# Patient Record
Sex: Female | Born: 1960
Health system: Southern US, Community
[De-identification: ages and names within clinical notes are randomized; demographics above are authoritative.]

## PROBLEM LIST (undated history)

## (undated) DIAGNOSIS — F32A Depression, unspecified: Secondary | ICD-10-CM

## (undated) DIAGNOSIS — T7840XA Allergy, unspecified, initial encounter: Secondary | ICD-10-CM

## (undated) DIAGNOSIS — F419 Anxiety disorder, unspecified: Secondary | ICD-10-CM

## (undated) DIAGNOSIS — I10 Essential (primary) hypertension: Secondary | ICD-10-CM

## (undated) DIAGNOSIS — F329 Major depressive disorder, single episode, unspecified: Secondary | ICD-10-CM

## (undated) DIAGNOSIS — E785 Hyperlipidemia, unspecified: Secondary | ICD-10-CM

## (undated) HISTORY — DX: Major depressive disorder, single episode, unspecified: F32.9

## (undated) HISTORY — DX: Depression, unspecified: F32.A

## (undated) HISTORY — DX: Hyperlipidemia, unspecified: E78.5

## (undated) HISTORY — DX: Anxiety disorder, unspecified: F41.9

## (undated) HISTORY — DX: Essential (primary) hypertension: I10

## (undated) HISTORY — DX: Allergy, unspecified, initial encounter: T78.40XA

## (undated) HISTORY — PX: ABDOMINAL HYSTERECTOMY: SHX81

---

## 2004-09-23 ENCOUNTER — Ambulatory Visit: Payer: Self-pay | Admitting: Obstetrics and Gynecology

## 2006-02-02 ENCOUNTER — Ambulatory Visit: Payer: Self-pay | Admitting: Obstetrics and Gynecology

## 2006-08-11 DIAGNOSIS — F32A Depression, unspecified: Secondary | ICD-10-CM | POA: Insufficient documentation

## 2006-08-11 DIAGNOSIS — I10 Essential (primary) hypertension: Secondary | ICD-10-CM | POA: Insufficient documentation

## 2006-08-11 DIAGNOSIS — M129 Arthropathy, unspecified: Secondary | ICD-10-CM | POA: Insufficient documentation

## 2006-09-16 DIAGNOSIS — E7800 Pure hypercholesterolemia, unspecified: Secondary | ICD-10-CM | POA: Insufficient documentation

## 2007-04-29 DIAGNOSIS — L259 Unspecified contact dermatitis, unspecified cause: Secondary | ICD-10-CM | POA: Insufficient documentation

## 2008-03-13 DIAGNOSIS — L0292 Furuncle, unspecified: Secondary | ICD-10-CM | POA: Insufficient documentation

## 2008-03-13 DIAGNOSIS — Q6650 Congenital pes planus, unspecified foot: Secondary | ICD-10-CM | POA: Insufficient documentation

## 2008-11-08 ENCOUNTER — Ambulatory Visit: Payer: Self-pay | Admitting: Obstetrics and Gynecology

## 2009-02-22 ENCOUNTER — Ambulatory Visit: Payer: Self-pay

## 2009-03-15 ENCOUNTER — Ambulatory Visit: Payer: Self-pay

## 2009-11-13 DIAGNOSIS — G4700 Insomnia, unspecified: Secondary | ICD-10-CM | POA: Insufficient documentation

## 2010-01-31 ENCOUNTER — Ambulatory Visit: Payer: Self-pay

## 2010-01-31 LAB — HM MAMMOGRAPHY

## 2013-11-02 LAB — LIPID PANEL
Cholesterol: 225 mg/dL — AB (ref 0–200)
HDL: 80 mg/dL — AB (ref 35–70)
LDL Cholesterol: 129 mg/dL
Triglycerides: 81 mg/dL (ref 40–160)

## 2013-11-02 LAB — BASIC METABOLIC PANEL
BUN: 20 mg/dL (ref 4–21)
CREATININE: 1.1 mg/dL (ref 0.5–1.1)
GLUCOSE: 104 mg/dL
Potassium: 4.8 mmol/L (ref 3.4–5.3)
Sodium: 138 mmol/L (ref 137–147)

## 2013-11-02 LAB — HEPATIC FUNCTION PANEL
ALT: 15 U/L (ref 7–35)
AST: 14 U/L (ref 13–35)

## 2013-11-02 LAB — TSH: TSH: 2.77 u[IU]/mL (ref 0.41–5.90)

## 2013-11-02 LAB — CBC AND DIFFERENTIAL
HEMATOCRIT: 40 % (ref 36–46)
HEMOGLOBIN: 13.2 g/dL (ref 12.0–16.0)
PLATELETS: 279 10*3/uL (ref 150–399)
WBC: 6.7 10*3/mL

## 2014-02-07 LAB — HEMOGLOBIN A1C: Hgb A1c MFr Bld: 6.1 % — AB (ref 4.0–6.0)

## 2015-03-15 ENCOUNTER — Other Ambulatory Visit: Payer: Self-pay | Admitting: Family Medicine

## 2015-03-15 DIAGNOSIS — I10 Essential (primary) hypertension: Secondary | ICD-10-CM | POA: Insufficient documentation

## 2015-03-15 DIAGNOSIS — F329 Major depressive disorder, single episode, unspecified: Secondary | ICD-10-CM

## 2015-03-15 DIAGNOSIS — F32A Depression, unspecified: Secondary | ICD-10-CM | POA: Insufficient documentation

## 2015-03-15 MED ORDER — LOSARTAN POTASSIUM 25 MG PO TABS: 25.0000 mg | ORAL_TABLET | Freq: Every day | ORAL | Status: DC

## 2015-03-15 MED ORDER — DILTIAZEM HCL ER COATED BEADS 240 MG PO CP24: 240.0000 mg | ORAL_CAPSULE | Freq: Every day | ORAL | Status: DC

## 2015-03-15 MED ORDER — BUPROPION HCL ER (XL) 150 MG PO TB24: 150.0000 mg | ORAL_TABLET | Freq: Every day | ORAL | Status: DC

## 2015-03-15 NOTE — Telephone Encounter (Signed)
Pt is taking Diltiazem 240mg  in [employer].   Thanks,   -Mickel Baas

## 2015-03-15 NOTE — Telephone Encounter (Signed)
Pt called wanting refills on RX .     losartan 25mg , bupropion xl tab 150 mg 1 tab daily, diltiazem cd 140mg    She usually uses [facility] but wants 30 days called in to [place].  Her call back. Is [phone].  Thanks Con Memos

## 2015-05-29 ENCOUNTER — Other Ambulatory Visit: Payer: Self-pay | Admitting: Family Medicine

## 2015-05-29 DIAGNOSIS — R5383 Other fatigue: Secondary | ICD-10-CM | POA: Insufficient documentation

## 2015-05-29 DIAGNOSIS — R197 Diarrhea, unspecified: Secondary | ICD-10-CM | POA: Insufficient documentation

## 2015-05-29 DIAGNOSIS — J309 Allergic rhinitis, unspecified: Secondary | ICD-10-CM | POA: Insufficient documentation

## 2015-05-29 DIAGNOSIS — I10 Essential (primary) hypertension: Secondary | ICD-10-CM

## 2015-05-29 DIAGNOSIS — E559 Vitamin D deficiency, unspecified: Secondary | ICD-10-CM | POA: Insufficient documentation

## 2015-05-29 DIAGNOSIS — R7303 Prediabetes: Secondary | ICD-10-CM | POA: Insufficient documentation

## 2015-05-29 DIAGNOSIS — F411 Generalized anxiety disorder: Secondary | ICD-10-CM | POA: Insufficient documentation

## 2015-07-12 ENCOUNTER — Ambulatory Visit: Payer: Self-pay | Admitting: Family Medicine

## 2015-09-18 ENCOUNTER — Other Ambulatory Visit: Payer: Self-pay

## 2015-09-18 DIAGNOSIS — F329 Major depressive disorder, single episode, unspecified: Secondary | ICD-10-CM

## 2015-09-18 DIAGNOSIS — F32A Depression, unspecified: Secondary | ICD-10-CM

## 2015-09-18 MED ORDER — BUPROPION HCL ER (XL) 150 MG PO TB24: 150.0000 mg | ORAL_TABLET | Freq: Every day | ORAL | Status: DC

## 2015-09-18 MED ORDER — FLUOXETINE HCL 20 MG PO TABS: 60.0000 mg | ORAL_TABLET | Freq: Every day | ORAL | Status: DC

## 2015-09-18 NOTE — Telephone Encounter (Signed)
Last OV 12/2014  Thanks,   -Mickel Baas

## 2015-10-15 ENCOUNTER — Other Ambulatory Visit: Payer: Self-pay | Admitting: Family Medicine

## 2015-10-15 DIAGNOSIS — J309 Allergic rhinitis, unspecified: Secondary | ICD-10-CM

## 2015-10-15 NOTE — Telephone Encounter (Signed)
Last OV 12/2014  Thanks,   -Mickel Baas

## 2015-12-24 ENCOUNTER — Ambulatory Visit (INDEPENDENT_AMBULATORY_CARE_PROVIDER_SITE_OTHER): Payer: 59 | Admitting: Physician Assistant

## 2015-12-24 ENCOUNTER — Encounter: Payer: Self-pay | Admitting: Physician Assistant

## 2015-12-24 VITALS — BP 138/98 | HR 62 | Temp 98.6°F | Resp 16 | Ht 62.0 in | Wt 313.2 lb

## 2015-12-24 DIAGNOSIS — G4700 Insomnia, unspecified: Secondary | ICD-10-CM | POA: Diagnosis not present

## 2015-12-24 DIAGNOSIS — Z0000 Encounter for general adult medical examination without abnormal findings: Secondary | ICD-10-CM | POA: Diagnosis not present

## 2015-12-24 DIAGNOSIS — Z1239 Encounter for other screening for malignant neoplasm of breast: Secondary | ICD-10-CM | POA: Diagnosis not present

## 2015-12-24 DIAGNOSIS — E559 Vitamin D deficiency, unspecified: Secondary | ICD-10-CM | POA: Diagnosis not present

## 2015-12-24 DIAGNOSIS — F329 Major depressive disorder, single episode, unspecified: Secondary | ICD-10-CM

## 2015-12-24 DIAGNOSIS — Z1211 Encounter for screening for malignant neoplasm of colon: Secondary | ICD-10-CM | POA: Diagnosis not present

## 2015-12-24 DIAGNOSIS — F32A Depression, unspecified: Secondary | ICD-10-CM

## 2015-12-24 DIAGNOSIS — R7303 Prediabetes: Secondary | ICD-10-CM | POA: Diagnosis not present

## 2015-12-24 DIAGNOSIS — E7800 Pure hypercholesterolemia, unspecified: Secondary | ICD-10-CM

## 2015-12-24 DIAGNOSIS — I10 Essential (primary) hypertension: Secondary | ICD-10-CM | POA: Diagnosis not present

## 2015-12-24 NOTE — Progress Notes (Signed)
Patient: Ashley Mccullough, Female    DOB: [birth year], 55 y.o.   MRN: [id number] Visit Date: 12/24/2015  Today's Provider: Mar Daring, PA-C   Chief Complaint  Patient presents with  . Annual Exam   Subjective:    Annual physical exam Ashley Mccullough is a 55 y.o. female who presents today for health maintenance and complete physical. She feels fairly well, she has frequent urination only.She reports going to the bathroom every 15 to 20 minutes sometimes specially when she is stress. During the night she wakes up at least 3 times to go to the bathroom if she has not taken the Trazadone. If she takes the Trazodone she wakes up 1 time to go. She reports that when she takes Trazadone she sleeps well. She usually takes it twice on the weekdays and mostly on the weekends because that way she is not afraid to oversleep. She reports exercising- 3 x a week she does the slimfit board with eight pound weights on her hands.   Depression: Patient needs refill on her Prozac. She complains of difficulty concentrating sometimes. She denies current suicidal and homicidal plan or intent.She complains of the following side effects from the treatment: none.She takes Prozac, Trazodone and Bupropion. She needs a refill on her prozac.  Urine frequency is not a new problem. This has been something she has had for a long time. She states she even had it before her hysterectomy, which she had in 2005.   Pap-UNK- Hysterectomy in 2005 due to fibroids. Ovaries remain.  Mammogram: UNK- has had one mammogram. Does not perform self breast exams. Positive family history for breast cancer in mother, maternal aunt, and 1st cousin (daughter of maternal aunt).  Colonoscopy: Refused TDAP: 12/26/2009   Review of Systems  Constitutional: Negative.   HENT: Negative.   Eyes: Negative.   Respiratory: Negative.   Cardiovascular: Positive for leg swelling (only after sitting all day). Negative for chest pain and  palpitations.  Gastrointestinal: Negative.   Endocrine: Positive for polyuria.  Genitourinary: Negative.   Musculoskeletal: Negative.   Skin: Negative.   Allergic/Immunologic: Positive for environmental allergies and food allergies.  Neurological: Negative.   Hematological: Negative.   Psychiatric/Behavioral: Negative.     Social History      She  reports that she has never smoked. She has never used smokeless tobacco. She reports that she drinks alcohol. She reports that she does not use illicit drugs.       Social History   Social History  . Marital Status: Divorced    Spouse Name: N/A  . Number of Children: N/A  . Years of Education: N/A   Social History Main Topics  . Smoking status: Never Smoker   . Smokeless tobacco: Never Used  . Alcohol Use: Yes  . Drug Use: No  . Sexual Activity: Not Asked   Other Topics Concern  . None   Social History Narrative    Past Medical History  Diagnosis Date  . Depression   . Allergy   . Anxiety   . Hypertension   . Hyperlipidemia      Patient Active Problem List   Diagnosis Date Noted  . Allergic rhinitis 05/29/2015  . Anxiety, generalized 05/29/2015  . D (diarrhea) 05/29/2015  . Avitaminosis D 05/29/2015  . Borderline diabetes 05/29/2015  . Fatigue 05/29/2015  . Depression 03/15/2015  . Hypertension 03/15/2015  . Cannot sleep 11/13/2009  . Carbuncle and furuncle 03/13/2008  . Congenital  pes planus 03/13/2008  . CD (contact dermatitis) 04/29/2007  . Hypercholesterolemia without hypertriglyceridemia 09/16/2006  . Arthropathia 08/11/2006  . Clinical depression 08/11/2006  . Essential (primary) hypertension 08/11/2006    Past Surgical History  Procedure Laterality Date  . Abdominal hysterectomy      still has ovaries    Family History        Family Status  Relation Status Death Age  . Mother Alive     pre diabetic  . Father Deceased 57    CAD  . Sister Alive   . Brother Alive   . Brother Alive          Her family history includes Alcohol abuse in her father; Cancer in her mother; Depression in her father; Diabetes in her brother and brother; Heart disease in her father; Hypertension in her sister.    Allergies  Allergen Reactions  . Simvastatin     Memory changes  . Sulfa Antibiotics     Previous Medications   BUPROPION (WELLBUTRIN XL) 150 MG 24 HR TABLET    Take 1 tablet (150 mg total) by mouth daily.   CHOLECALCIFEROL (VITAMIN D3) 5000 UNITS TABS    Take 5,000 tablets by mouth daily.   DILTIAZEM (CARDIZEM CD) 240 MG 24 HR CAPSULE    Take 1 capsule by mouth  daily   FEXOFENADINE (ALLEGRA) 180 MG TABLET    TAKE 1 TABLET BY MOUTH DAILY   FLUOXETINE (PROZAC) 20 MG TABLET    Take 3 tablets (60 mg total) by mouth daily.   LOSARTAN (COZAAR) 25 MG TABLET    Take 1 tablet by mouth  every day   MONTELUKAST (SINGULAIR) 10 MG TABLET    Take 1 tablet by mouth  daily   TRAZODONE (DESYREL) 100 MG TABLET    Take by mouth. 1 1/2 tablet at bedtime    Patient Care Team: Mar Daring, PA-C as PCP - General (Family Medicine)     Objective:   Vitals: BP 138/98 mmHg  Pulse 62  Temp(Src) 98.6 F (37 C) (Oral)  Resp 16  Ht 5\' 2"  (1.575 m)  Wt 313 lb 3.2 oz (142.067 kg)  BMI 57.27 kg/m2   Physical Exam  Constitutional: She is oriented to person, place, and time. She appears well-developed and well-nourished. No distress.  HENT:  Head: Normocephalic and atraumatic.  Right Ear: External ear normal.  Left Ear: External ear normal.  Nose: Nose normal.  Mouth/Throat: Oropharynx is clear and moist. No oropharyngeal exudate.  Eyes: Conjunctivae and EOM are normal. Pupils are equal, round, and reactive to light. Right eye exhibits no discharge. Left eye exhibits no discharge. No scleral icterus.  Neck: Normal range of motion. Neck supple. No JVD present. No tracheal deviation present. No thyromegaly present.  Cardiovascular: Normal rate, regular rhythm, normal heart sounds and intact distal  pulses.  Exam reveals no gallop and no friction rub.   No murmur heard. Pulmonary/Chest: Effort normal and breath sounds normal. No respiratory distress. She has no decreased breath sounds. She has no wheezes. She has no rales. She exhibits no tenderness. Right breast exhibits inverted nipple. Right breast exhibits no mass, no nipple discharge, no skin change and no tenderness. Left breast exhibits inverted nipple. Left breast exhibits no mass, no nipple discharge, no skin change and no tenderness. Breasts are symmetrical.  Abdominal: Soft. Bowel sounds are normal. She exhibits no distension and no mass. There is no tenderness. There is no rebound and no guarding.  Genitourinary:  Deferred because patient "was not prepared" today. Advised will do next year.  Musculoskeletal: Normal range of motion. She exhibits edema (trace). She exhibits no tenderness.  Lymphadenopathy:    She has no cervical adenopathy.  Neurological: She is alert and oriented to person, place, and time.  Skin: Skin is warm and dry. No rash noted. She is not diaphoretic.  Psychiatric: She has a normal mood and affect. Her behavior is normal. Judgment and thought content normal.  Vitals reviewed.    Depression Screen No flowsheet data found.    Assessment & Plan:     Routine Health Maintenance and Physical Exam  1. Annual physical exam Physical exam today was normal with exception of morbid obesity. She is exercising and trying to lose weight. Will check labs as below. F/u pending lab results. If labs are stable and WNL will not need repeated for one year at next annual physical exam. She is to call in the meantime if she develops any acute issue, question or concern. - CBC with Differential/Platelet - Comprehensive metabolic panel - TSH  2. Essential hypertension Stable. Will check labs as below and f/u pending results. Continue cardizem and cozaar as directed. - CBC with Differential/Platelet - Comprehensive  metabolic panel  3. Borderline diabetes Will check labs as below and f/u pending results. - Hemoglobin A1c  4. Depression Stable on wellbutrin and prozac. Prozac refilled.   5. Cannot sleep Stable with trazodone. Continue current medical treatment plan.  6. Hypercholesterolemia without hypertriglyceridemia Diet controlled. Will check labs as below and f/u pending results. - Lipid panel  7. Breast cancer screening Breast exam normal today. Mammogram ordered as below. Information for [facility] was given to patient so she may call and schedule this at her convenience. - MM DIGITAL SCREENING BILATERAL; Future  8. Avitaminosis D Will check Vit D level as below. Currently on 5000 IU daily.  - Vitamin D (25 hydroxy)  9. Colon cancer screening Patient refused colonoscopy. She was given information on cologuard so she may call to see if her insurance covers it. She will call if it is wanted.   Exercise Activities and Dietary recommendations Goals    None      Immunization History  Administered Date(s) Administered  . Tdap 12/26/2009    Health Maintenance  Topic Date Due  . Hepatitis C Screening  11/21/1960  . HIV Screening  04/20/1976  . PAP SMEAR  04/20/1982  . COLONOSCOPY  04/21/2011  . MAMMOGRAM  02/01/2012  . INFLUENZA VACCINE  04/15/2016  . TETANUS/TDAP  12/27/2019      Discussed health benefits of physical activity, and encouraged her to engage in regular exercise appropriate for her age and condition.    --------------------------------------------------------------------

## 2015-12-24 NOTE — Patient Instructions (Signed)

## 2015-12-25 LAB — COMPREHENSIVE METABOLIC PANEL
A/G RATIO: 1.1 — AB (ref 1.2–2.2)
ALBUMIN: 3.9 g/dL (ref 3.5–5.5)
ALT: 15 IU/L (ref 0–32)
AST: 19 IU/L (ref 0–40)
Alkaline Phosphatase: 93 IU/L (ref 39–117)
BUN/Creatinine Ratio: 20 (ref 9–23)
BUN: 19 mg/dL (ref 6–24)
Bilirubin Total: 0.3 mg/dL (ref 0.0–1.2)
CALCIUM: 9.2 mg/dL (ref 8.7–10.2)
CO2: 21 mmol/L (ref 18–29)
CREATININE: 0.95 mg/dL (ref 0.57–1.00)
Chloride: 100 mmol/L (ref 96–106)
GFR, EST AFRICAN AMERICAN: 79 mL/min/{1.73_m2} (ref >59)
GFR, EST NON AFRICAN AMERICAN: 68 mL/min/{1.73_m2} (ref >59)
GLOBULIN, TOTAL: 3.5 g/dL (ref 1.5–4.5)
Glucose: 95 mg/dL (ref 65–99)
POTASSIUM: 4.5 mmol/L (ref 3.5–5.2)
SODIUM: 138 mmol/L (ref 134–144)
TOTAL PROTEIN: 7.4 g/dL (ref 6.0–8.5)

## 2015-12-25 LAB — CBC WITH DIFFERENTIAL/PLATELET
BASOS: 0 %
Basophils Absolute: 0 10*3/uL (ref 0.0–0.2)
EOS (ABSOLUTE): 0.2 10*3/uL (ref 0.0–0.4)
EOS: 3 %
HEMATOCRIT: 42.3 % (ref 34.0–46.6)
HEMOGLOBIN: 13.8 g/dL (ref 11.1–15.9)
IMMATURE GRANS (ABS): 0 10*3/uL (ref 0.0–0.1)
IMMATURE GRANULOCYTES: 0 %
Lymphocytes Absolute: 1.5 10*3/uL (ref 0.7–3.1)
Lymphs: 21 %
MCH: 28.8 pg (ref 26.6–33.0)
MCHC: 32.6 g/dL (ref 31.5–35.7)
MCV: 88 fL (ref 79–97)
MONOCYTES: 5 %
MONOS ABS: 0.4 10*3/uL (ref 0.1–0.9)
NEUTROS PCT: 71 %
Neutrophils Absolute: 5 10*3/uL (ref 1.4–7.0)
Platelets: 287 10*3/uL (ref 150–379)
RBC: 4.8 x10E6/uL (ref 3.77–5.28)
RDW: 14.6 % (ref 12.3–15.4)
WBC: 7.1 10*3/uL (ref 3.4–10.8)

## 2015-12-25 LAB — LIPID PANEL
CHOL/HDL RATIO: 2.7 ratio (ref 0.0–4.4)
Cholesterol, Total: 236 mg/dL — ABNORMAL HIGH (ref 100–199)
HDL: 87 mg/dL (ref >39)
LDL CALC: 136 mg/dL — AB (ref 0–99)
TRIGLYCERIDES: 64 mg/dL (ref 0–149)
VLDL Cholesterol Cal: 13 mg/dL (ref 5–40)

## 2015-12-25 LAB — HEMOGLOBIN A1C
Est. average glucose Bld gHb Est-mCnc: 128 mg/dL
Hgb A1c MFr Bld: 6.1 % — ABNORMAL HIGH (ref 4.8–5.6)

## 2015-12-25 LAB — VITAMIN D 25 HYDROXY (VIT D DEFICIENCY, FRACTURES): Vit D, 25-Hydroxy: 35 ng/mL (ref 30.0–100.0)

## 2015-12-25 LAB — TSH: TSH: 2.18 u[IU]/mL (ref 0.450–4.500)

## 2015-12-26 ENCOUNTER — Telehealth: Payer: Self-pay

## 2015-12-26 NOTE — Telephone Encounter (Signed)
-----   Message from Mar Daring, PA-C sent at 12/25/2015  8:32 AM EDT ----- All labs are within normal limits and stable.  HgBA1c remains 6.1 stable from last year. Fasting sugar has improved from 104 last year to 95 which shows diet changes. Total cholesterol and LDL are both elevated slightly, however, your HDL is also elevated which offers cardioprotection and lowers your cardiovascular risk so no cholesterol lowering medication is required. Vit D level is normal. Continue dieting and exercise. Will recheck labs in one year at next annual physical. Thanks! -JB

## 2015-12-26 NOTE — Telephone Encounter (Signed)
Patient advised as directed below. Patient verbalized understanding and agrees with plan of care.  

## 2015-12-26 NOTE — Telephone Encounter (Signed)
LMTCB  Thanks,  -Trestan Vahle 

## 2015-12-27 ENCOUNTER — Ambulatory Visit: Payer: Self-pay

## 2015-12-28 ENCOUNTER — Telehealth: Payer: Self-pay

## 2015-12-28 DIAGNOSIS — F329 Major depressive disorder, single episode, unspecified: Secondary | ICD-10-CM

## 2015-12-28 DIAGNOSIS — F32A Depression, unspecified: Secondary | ICD-10-CM

## 2015-12-28 NOTE — Telephone Encounter (Signed)
Patient called and states that she needed Prozac switched back to capsules instead of tablets. She states she addressed this on her visit with you on the 10th. Please send for [facility] day supply to [facility], patient is almost out if this could be done today. Please. Advised patient to check with pharmacy later and if there is a problem getting it filled we will call her back. -aa

## 2015-12-31 MED ORDER — FLUOXETINE HCL 20 MG PO CAPS: 60.0000 mg | ORAL_CAPSULE | Freq: Every day | ORAL | Status: DC

## 2015-12-31 NOTE — Telephone Encounter (Signed)
Sent to [facility]

## 2016-01-04 ENCOUNTER — Ambulatory Visit
Admission: RE | Admit: 2016-01-04 | Discharge: 2016-01-04 | Disposition: A | Discharge status: Home / Self Care | Payer: 59 | Source: Ambulatory Visit | Attending: Physician Assistant | Admitting: Physician Assistant

## 2016-01-04 ENCOUNTER — Telehealth: Payer: Self-pay

## 2016-01-04 DIAGNOSIS — Z1239 Encounter for other screening for malignant neoplasm of breast: Secondary | ICD-10-CM

## 2016-01-04 DIAGNOSIS — Z1231 Encounter for screening mammogram for malignant neoplasm of breast: Secondary | ICD-10-CM | POA: Insufficient documentation

## 2016-01-04 NOTE — Telephone Encounter (Signed)
-----   Message from Mar Daring, [place] sent at 01/04/2016 11:16 AM EDT ----- Normal mammogram. Repeat screening in one year.

## 2016-01-04 NOTE — Telephone Encounter (Signed)
Patient advised as directed below.  Thanks,  -Joseline 

## 2016-03-24 ENCOUNTER — Other Ambulatory Visit: Payer: Self-pay | Admitting: Family Medicine

## 2016-03-24 DIAGNOSIS — F411 Generalized anxiety disorder: Secondary | ICD-10-CM

## 2016-03-24 DIAGNOSIS — F329 Major depressive disorder, single episode, unspecified: Secondary | ICD-10-CM

## 2016-03-24 DIAGNOSIS — F32A Depression, unspecified: Secondary | ICD-10-CM

## 2016-03-24 NOTE — Telephone Encounter (Signed)
Had CPE with you on 12/24/2015. Renaldo Fiddler, CMA

## 2016-05-26 ENCOUNTER — Ambulatory Visit (INDEPENDENT_AMBULATORY_CARE_PROVIDER_SITE_OTHER): Payer: 59 | Admitting: Physician Assistant

## 2016-05-26 ENCOUNTER — Encounter: Payer: Self-pay | Admitting: Physician Assistant

## 2016-05-26 VITALS — BP 130/70 | HR 63 | Temp 97.6°F | Resp 16 | Ht 62.0 in | Wt 317.4 lb

## 2016-05-26 DIAGNOSIS — Z6841 Body Mass Index (BMI) 40.0 and over, adult: Secondary | ICD-10-CM

## 2016-05-26 DIAGNOSIS — L02234 Carbuncle of groin: Secondary | ICD-10-CM | POA: Diagnosis not present

## 2016-05-26 DIAGNOSIS — M5432 Sciatica, left side: Secondary | ICD-10-CM | POA: Diagnosis not present

## 2016-05-26 DIAGNOSIS — G4700 Insomnia, unspecified: Secondary | ICD-10-CM | POA: Diagnosis not present

## 2016-05-26 DIAGNOSIS — M5431 Sciatica, right side: Secondary | ICD-10-CM | POA: Diagnosis not present

## 2016-05-26 MED ORDER — CLONAZEPAM 1 MG PO TABS: 1.0000 mg | ORAL_TABLET | Freq: Every day | ORAL | 1 refills | Status: DC

## 2016-05-26 MED ORDER — DOXYCYCLINE HYCLATE 100 MG PO TABS: 100.0000 mg | ORAL_TABLET | Freq: Two times a day (BID) | ORAL | 0 refills | Status: DC

## 2016-05-26 MED ORDER — CYCLOBENZAPRINE HCL 5 MG PO TABS: 5.0000 mg | ORAL_TABLET | Freq: Every day | ORAL | 1 refills | Status: DC

## 2016-05-26 NOTE — Patient Instructions (Signed)
Clonazepam tablets What is this medicine? CLONAZEPAM (kloe NA ze pam) is a benzodiazepine. It is used to treat certain types of seizures. It is also used to treat panic disorder. This medicine may be used for other purposes; ask your health care provider or pharmacist if you have questions. What should I tell my health care provider before I take this medicine? They need to know if you have any of these conditions: -an alcohol or drug abuse problem -bipolar disorder, depression, psychosis or other mental health condition -glaucoma -kidney or liver disease -lung or breathing disease -myasthenia gravis -Parkinson's disease -porphyria -seizures or a history of seizures -suicidal thoughts -an unusual or allergic reaction to clonazepam, other benzodiazepines, foods, dyes, or preservatives -pregnant or trying to get pregnant -breast-feeding How should I use this medicine? Take this medicine by mouth with a glass of water. Follow the directions on the prescription label. If it upsets your stomach, take it with food or milk. Take your medicine at regular intervals. Do not take it more often than directed. Do not stop taking or change the dose except on the advice of your doctor or health care professional. A special MedGuide will be given to you by the pharmacist with each prescription and refill. Be sure to read this information carefully each time. Talk to your pediatrician regarding the use of this medicine in children. Special care may be needed. Overdosage: If you think you have taken too much of this medicine contact a poison control center or emergency room at once. NOTE: This medicine is only for you. Do not share this medicine with others. What if I miss a dose? If you miss a dose, take it as soon as you can. If it is almost time for your next dose, take only that dose. Do not take double or extra doses. What may interact with this medicine? -herbal or dietary supplements -medicines for  depression, anxiety, or psychotic disturbances -medicines for fungal infections like fluconazole, itraconazole, ketoconazole, voriconazole -medicines for HIV infection or AIDS -medicines for sleep -prescription pain medicines -propantheline -rifampin -sevelamer -some medicines for seizures like carbamazepine, phenobarbital, phenytoin, primidone This list may not describe all possible interactions. Give your health care provider a list of all the medicines, herbs, non-prescription drugs, or dietary supplements you use. Also tell them if you smoke, drink alcohol, or use illegal drugs. Some items may interact with your medicine. What should I watch for while using this medicine? Visit your doctor or health care professional for regular checks on your progress. Your body may become dependent on this medicine. If you have been taking this medicine regularly for some time, do not suddenly stop taking it. You must gradually reduce the dose or you may get severe side effects. Ask your doctor or health care professional for advice before increasing or decreasing the dose. Even after you stop taking this medicine it can still affect your body for several days. If you suffer from several types of seizures, this medicine may increase the chance of grand mal seizures (epilepsy). Let your doctor or health care professional know, he or she may want to prescribe an additional medicine. You may get drowsy or dizzy. Do not drive, use machinery, or do anything that needs mental alertness until you know how this medicine affects you. To reduce the risk of dizzy and fainting spells, do not stand or sit up quickly, especially if you are an older patient. Alcohol may increase dizziness and drowsiness. Avoid alcoholic drinks. Do not treat   yourself for coughs, colds or allergies without asking your doctor or health care professional for advice. Some ingredients can increase possible side effects. The use of this medicine may  increase the chance of suicidal thoughts or actions. Pay special attention to how you are responding while on this medicine. Any worsening of mood, or thoughts of suicide or dying should be reported to your health care professional right away. Women who become pregnant while using this medicine may enroll in the [place] Pregnancy Registry by calling [phone]. This registry collects information about the safety of antiepileptic drug use during pregnancy. What side effects may I notice from receiving this medicine? Side effects that you should report to your doctor or health care professional as soon as possible: -allergic reactions like skin rash, itching or hives, swelling of the face, lips, or tongue -changes in vision -confusion -depression -hallucinations -mood changes, excitability or aggressive behavior -movement difficulty, staggering or jerky movements -muscle cramps, weakness -tremors -unusual eye movements Side effects that usually do not require medical attention (report to your doctor or health care professional if they continue or are bothersome): -constipation or diarrhea -difficulty sleeping, nightmares -dizziness, drowsiness -headache -increased saliva from your mouth -nausea, vomiting This list may not describe all possible side effects. Call your doctor for medical advice about side effects. You may report side effects to FDA at 1-800-FDA-1088. Where should I keep my medicine? Keep out of the reach of children. This medicine can be abused. Keep your medicine in a safe place to protect it from theft. Do not share this medicine with anyone. Selling or giving away this medicine is dangerous and against the law. This medicine may cause accidental overdose and death if taken by other adults, children, or pets. Mix any unused medicine with a substance like cat litter or coffee grounds. Then throw the medicine away in a sealed container like a sealed  bag or a coffee can with a lid. Do not use the medicine after the expiration date. Store at room temperature between 15 and 30 degrees C (59 and 86 degrees F). Protect from light. Keep container tightly closed. NOTE: This sheet is a summary. It may not cover all possible information. If you have questions about this medicine, talk to your doctor, pharmacist, or health care provider.    2016, Elsevier/Gold Standard. (2014-12-12 14:01:43)   Exercising to Lose Weight Exercising can help you to lose weight. In order to lose weight through exercise, you need to do vigorous-intensity exercise. You can tell that you are exercising with vigorous intensity if you are breathing very hard and fast and cannot hold a conversation while exercising. Moderate-intensity exercise helps to maintain your current weight. You can tell that you are exercising at a moderate level if you have a higher heart rate and faster breathing, but you are still able to hold a conversation. HOW OFTEN SHOULD I EXERCISE? Choose an activity that you enjoy and set realistic goals. Your health care provider can help you to make an activity plan that works for you. Exercise regularly as directed by your health care provider. This may include:  Doing resistance training twice each week, such as:  Push-ups.  Sit-ups.  Lifting weights.  Using resistance bands.  Doing a given intensity of exercise for a given amount of time. Choose from these options:  150 minutes of moderate-intensity exercise every week.  75 minutes of vigorous-intensity exercise every week.  A mix of moderate-intensity and vigorous-intensity exercise every week. Children,  pregnant women, people who are out of shape, people who are overweight, and older adults may need to consult a health care provider for individual recommendations. If you have any sort of medical condition, be sure to consult your health care provider before starting a new exercise  program. WHAT ARE SOME ACTIVITIES THAT CAN HELP ME TO LOSE WEIGHT?   Walking at a rate of at least 4.5 miles an hour.  Jogging or running at a rate of 5 miles per hour.  Biking at a rate of at least 10 miles per hour.  Lap swimming.  Roller-skating or in-line skating.  Cross-country skiing.  Vigorous competitive sports, such as football, basketball, and soccer.  Jumping rope.  Aerobic dancing. HOW CAN I BE MORE ACTIVE IN MY DAY-TO-DAY ACTIVITIES?  Use the stairs instead of the elevator.  Take a walk during your lunch break.  If you drive, park your car farther away from work or school.  If you take public transportation, get off one stop early and walk the rest of the way.  Make all of your phone calls while standing up and walking around.  Get up, stretch, and walk around every 30 minutes throughout the day. WHAT GUIDELINES SHOULD I FOLLOW WHILE EXERCISING?  Do not exercise so much that you hurt yourself, feel dizzy, or get very short of breath.  Consult your health care provider prior to starting a new exercise program.  Wear comfortable clothes and shoes with good support.  Drink plenty of water while you exercise to prevent dehydration or heat stroke. Body water is lost during exercise and must be replaced.  Work out until you breathe faster and your heart beats faster.   This information is not intended to replace advice given to you by your health care provider. Make sure you discuss any questions you have with your health care provider.   Document Released: 10/04/2010 Document Revised: 09/22/2014 Document Reviewed: 02/02/2014 Elsevier Interactive Patient Education [employer].

## 2016-05-26 NOTE — Progress Notes (Signed)
Patient: Ashley Mccullough Female    DOB: December 01, 1960   55 y.o.   MRN: [id number] Visit Date: 05/26/2016  Today's Provider: Mar Daring, PA-C   Chief Complaint  Patient presents with  . Obesity  . Cannot Sleep   Subjective:    HPI Obesity: Patient complains of obesity. Patient cites health, increased physical ability as reasons for wanting to lose weight. 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   Obesity History Weight in late teens: 260 lb. Period of greatest weight gain: 360 lb during mid adult years Lowest adult weight: 317.4 lbs Highest adult weight: 360 lbs   History of Weight Loss Efforts Greatest amount of weight lost: 40 lb over 24 months Amount of time that loss was maintained: 12 months Circumstances associated with regain of weight: Stress and lifestyle. Successful weight loss techniques attempted: self-directed dieting Unsuccessful weight loss techniques attempted: Weight Watchers  Current Exercise Habits none  Current Eating Habits Number of regular meals per day: 2 very unhealthy. Number of snacking episodes per day: 2 Who shops for food? patient Who prepares food? patient Who eats with patient? patient Binge behavior?: no Purge behavior? no Anorexic behavior? no Eating precipitated by stress? yes  Guilt feelings associated with eating? yes  Other Potential Contributing Factors Use of alcohol: average 0 drinks/week-occasional wine. 4 oz. Once in while sips of beer. History of past abuse? emotional (no), physical (no) and sexual (no) Psych History: depression and obesity   Cannot Sleep: She reports that her concentration is decreasing at work due to lack of sleep. She reports that she is arriving late at work. She reports that she is falling asleep around 2:30 am and gets up around 9 am to get ready to go to work. She goes in at 11 am. She reports she wakes up 2-3 times at night. She is taking Trazodone but it not  helping anymore. She reports she is under a lot of stress with her mother. Her mother has had declining health. She recently had bowel surgery, had to be placed in an assisted living facility, and may most likely be moved to a memory facility in the near future. Her mother is in [place], [place] so she has been traveling more to visit with her. Also she recently moved, 4 months ago, and has yet to organize her house due to constantly traveling. This is also affecting her ability to sleep.  Irritation: she has irritation in her vaginal area. She gets flare ups and usually "antiboitic" helps. She reports the irritation is boils in the groin area that form from moisture. She denies any vaginal discharge, yeast infection symptoms or UTI symptoms.    Allergies  Allergen Reactions  . Simvastatin     Memory changes  . Sulfa Antibiotics      Current Outpatient Prescriptions:  .  buPROPion (WELLBUTRIN XL) 150 MG 24 hr tablet, Take 1 tablet by mouth  daily, Disp: 90 tablet, Rfl: 3 .  Cholecalciferol (VITAMIN D3) 5000 units TABS, Take 5,000 tablets by mouth daily., Disp: , Rfl:  .  diltiazem (CARDIZEM CD) 240 MG 24 hr capsule, Take 1 capsule by mouth  daily, Disp: 90 capsule, Rfl: 3 .  fexofenadine (ALLEGRA) 180 MG tablet, TAKE 1 TABLET BY MOUTH DAILY, Disp: 90 tablet, Rfl: 3 .  FLUoxetine (PROZAC) 20 MG capsule, Take 3 capsules (60 mg total) by mouth daily., Disp: 270 capsule, Rfl: 3 .  losartan (  COZAAR) 25 MG tablet, Take 1 tablet by mouth  every day, Disp: 90 tablet, Rfl: 3 .  montelukast (SINGULAIR) 10 MG tablet, Take 1 tablet by mouth  daily, Disp: 90 tablet, Rfl: 3 .  traZODone (DESYREL) 100 MG tablet, Take by mouth. 1 1/2 tablet at bedtime, Disp: , Rfl:   Review of Systems  Constitutional: Positive for fatigue.  Eyes: Negative for visual disturbance.  Respiratory: Negative for cough, chest tightness and shortness of breath.   Cardiovascular: Positive for leg swelling. Negative for chest pain and  palpitations.  Gastrointestinal: Negative for abdominal pain.  Musculoskeletal: Positive for back pain (pain more in mid gluteal region bilaterally).  Neurological: Negative for dizziness, light-headedness and headaches.  Psychiatric/Behavioral: Positive for decreased concentration, dysphoric mood and sleep disturbance. The patient is nervous/anxious (racing thoughts that keep her from sleeping).     Social History  Substance Use Topics  . Smoking status: Never Smoker  . Smokeless tobacco: Never Used  . Alcohol use Yes   Objective:   BP 130/70 (BP Location: Left Wrist, Patient Position: Sitting, Cuff Size: Normal)   Pulse 63   Temp 97.6 F (36.4 C) (Oral)   Resp 16   Ht 5\' 2"  (1.575 m)   Wt (!) 317 lb 6.4 oz (144 kg)   BMI 58.05 kg/m   Physical Exam  Constitutional: She appears well-developed and well-nourished. No distress.  Neck: Normal range of motion. Neck supple. No JVD present. No tracheal deviation present. No thyromegaly present.  Cardiovascular: Normal rate, regular rhythm and normal heart sounds.  Exam reveals no gallop and no friction rub.   No murmur heard. Pulmonary/Chest: Effort normal and breath sounds normal. No respiratory distress. She has no wheezes. She has no rales.  Musculoskeletal: She exhibits edema (trace).  Lymphadenopathy:    She has no cervical adenopathy.  Skin: She is not diaphoretic.  Psychiatric: She has a normal mood and affect. Her behavior is normal. Judgment and thought content normal.  Vitals reviewed.     Assessment & Plan:     1. Insomnia Will switch from trazodone to clonazepam to see if this helps her sleep any better. She is to call if she does not tolerate the medication. I will see her back in 4 weeks to see how medication is working. - clonazePAM (KLONOPIN) 1 MG tablet; Take 1 tablet (1 mg total) by mouth at bedtime.  Dispense: 30 tablet; Refill: 1  2. Carbuncle of groin She has been treating conservatively with warm  compresses. Will give doxycycline as below. She is to call if they do not improve with treatment. - doxycycline (VIBRA-TABS) 100 MG tablet; Take 1 tablet (100 mg total) by mouth 2 (two) times daily.  Dispense: 14 tablet; Refill: 0  3. Bilateral sciatica Worsening sciatic symptoms most likely secondary to her driving back and forth to [place] so often now. Will add flexeril for her to use prn. Advised of drowsiness precautions with this medication. - cyclobenzaprine (FLEXERIL) 5 MG tablet; Take 1 tablet (5 mg total) by mouth at bedtime.  Dispense: 30 tablet; Refill: 1  4. Morbid obesity with body mass index (BMI) of 50.0 to 59.9 in adult [facility]) Discussed lifestyle modifications including healthy dieting, smaller portions, healthy snacks to keep from overeating, increasing water intake and adding exercise to a regular routine.       Ashley Daring, PA-C  [facility]

## 2016-06-17 ENCOUNTER — Other Ambulatory Visit: Payer: Self-pay | Admitting: Family Medicine

## 2016-06-17 DIAGNOSIS — J309 Allergic rhinitis, unspecified: Secondary | ICD-10-CM

## 2016-06-17 DIAGNOSIS — I10 Essential (primary) hypertension: Secondary | ICD-10-CM

## 2016-06-18 ENCOUNTER — Other Ambulatory Visit: Payer: Self-pay | Admitting: Physician Assistant

## 2016-06-18 DIAGNOSIS — L02234 Carbuncle of groin: Secondary | ICD-10-CM

## 2016-06-23 ENCOUNTER — Ambulatory Visit (INDEPENDENT_AMBULATORY_CARE_PROVIDER_SITE_OTHER): Payer: 59 | Admitting: Physician Assistant

## 2016-06-23 ENCOUNTER — Encounter: Payer: Self-pay | Admitting: Physician Assistant

## 2016-06-23 VITALS — BP 170/100 | HR 77 | Temp 98.0°F | Resp 16 | Wt 317.4 lb

## 2016-06-23 DIAGNOSIS — G4709 Other insomnia: Secondary | ICD-10-CM | POA: Diagnosis not present

## 2016-06-23 DIAGNOSIS — I10 Essential (primary) hypertension: Secondary | ICD-10-CM

## 2016-06-23 NOTE — Progress Notes (Signed)
Patient: Ashley Mccullough Female    DOB: 07-Nov-1960   55 y.o.   MRN: [id number] Visit Date: 06/23/2016  Today's Provider: Mar Daring, PA-C   Chief Complaint  Patient presents with  . Follow-up    Insomnia   Subjective:    HPI Patient is here today to follow-up on Insomnia.Patient was switched from trazodone to clonazepam to see if this helps her sleep. She reports that the Clonazepam is helping her with her sleep, but she did stop the medication because it was making her feel sad. She reports she had a friend passed away last week and she is also worry about her mother and feels like the Clonazepam made her feel more depressed. She feels that since she stopped the Clonazepam she is feeling a little better.    Allergies  Allergen Reactions  . Simvastatin     Memory changes  . Sulfa Antibiotics      Current Outpatient Prescriptions:  .  buPROPion (WELLBUTRIN XL) 150 MG 24 hr tablet, Take 1 tablet by mouth  daily, Disp: 90 tablet, Rfl: 3 .  Cholecalciferol (VITAMIN D3) 5000 units TABS, Take 5,000 tablets by mouth daily., Disp: , Rfl:  .  diltiazem (CARDIZEM CD) 240 MG 24 hr capsule, TAKE 1 CAPSULE BY MOUTH  DAILY, Disp: 90 capsule, Rfl: 1 .  doxycycline (VIBRA-TABS) 100 MG tablet, Take 1 tablet (100 mg total) by mouth 2 (two) times daily., Disp: 14 tablet, Rfl: 0 .  fexofenadine (ALLEGRA) 180 MG tablet, TAKE 1 TABLET BY MOUTH DAILY, Disp: 90 tablet, Rfl: 3 .  FLUoxetine (PROZAC) 20 MG capsule, Take 3 capsules (60 mg total) by mouth daily., Disp: 270 capsule, Rfl: 3 .  losartan (COZAAR) 25 MG tablet, TAKE 1 TABLET BY MOUTH  EVERY DAY, Disp: 90 tablet, Rfl: 1 .  montelukast (SINGULAIR) 10 MG tablet, TAKE 1 TABLET BY MOUTH  DAILY, Disp: 90 tablet, Rfl: 1 .  clonazePAM (KLONOPIN) 1 MG tablet, Take 1 tablet (1 mg total) by mouth at bedtime. (Patient not taking: Reported on 06/23/2016), Disp: 30 tablet, Rfl: 1 .  cyclobenzaprine (FLEXERIL) 5 MG tablet, Take 1 tablet (5 mg  total) by mouth at bedtime. (Patient not taking: Reported on 06/23/2016), Disp: 30 tablet, Rfl: 1  Review of Systems  Constitutional: Negative.   Respiratory: Negative.   Cardiovascular: Positive for leg swelling. Negative for chest pain and palpitations.  Gastrointestinal: Negative.  Abdominal pain: always have the swelling.  Neurological: Negative.   Psychiatric/Behavioral: Positive for decreased concentration and sleep disturbance. Negative for self-injury and suicidal ideas. The patient is nervous/anxious (more anxious).     Social History  Substance Use Topics  . Smoking status: Never Smoker  . Smokeless tobacco: Never Used  . Alcohol use Yes   Objective:   BP (!) 170/100 (BP Location: Right Wrist, Patient Position: Sitting, Cuff Size: Normal)   Pulse 77   Temp 98 F (36.7 C) (Oral)   Resp 16   Wt (!) 317 lb 6.4 oz (144 kg)   BMI 58.05 kg/m   Physical Exam  Constitutional: She appears well-developed and well-nourished. No distress.  Neck: Normal range of motion. Neck supple.  Cardiovascular: Normal rate, regular rhythm and normal heart sounds.  Exam reveals no gallop and no friction rub.   No murmur heard. Pulmonary/Chest: Effort normal and breath sounds normal. No respiratory distress. She has no wheezes. She has no rales.  Skin: She is not diaphoretic.  Psychiatric: She has  a normal mood and affect. Her behavior is normal. Judgment and thought content normal.  Vitals reviewed.     Assessment & Plan:     1. Other insomnia Will have her switch some of her medications around that could be causing her to have daytime drowsiness and then not sleeping well at night. Will change wellbutrin to daily use and change singulair to nightly use. She may take tylenol pm if needed. She is to call if no improvement. Will see her back in 6 months for CPE if she does well with above changes.  2. Essential hypertension Did not eat well over the weekend, plus stress from the loss of a  friend to cancer last week, worrying about her mother, and rushing to the appointment today. Will have her monitor at home and call if readings are remaining elevated.       Mar Daring, PA-C  [facility]

## 2016-06-23 NOTE — Patient Instructions (Addendum)
Start taking wellbutrin in the morning. May try Tylenol PM as needed for sleep.  Start taking montelukast (singulair) at bedtime. Call if no improvement.

## 2016-07-04 ENCOUNTER — Ambulatory Visit (INDEPENDENT_AMBULATORY_CARE_PROVIDER_SITE_OTHER): Payer: 59 | Admitting: Physician Assistant

## 2016-07-04 ENCOUNTER — Encounter: Payer: Self-pay | Admitting: Physician Assistant

## 2016-07-04 VITALS — BP 162/98 | HR 84 | Temp 98.8°F | Resp 16 | Wt 314.0 lb

## 2016-07-04 DIAGNOSIS — R05 Cough: Secondary | ICD-10-CM

## 2016-07-04 DIAGNOSIS — J019 Acute sinusitis, unspecified: Secondary | ICD-10-CM

## 2016-07-04 DIAGNOSIS — L0293 Carbuncle, unspecified: Secondary | ICD-10-CM | POA: Diagnosis not present

## 2016-07-04 DIAGNOSIS — I10 Essential (primary) hypertension: Secondary | ICD-10-CM

## 2016-07-04 DIAGNOSIS — R059 Cough, unspecified: Secondary | ICD-10-CM

## 2016-07-04 MED ORDER — BENZONATATE 100 MG PO CAPS: 100.0000 mg | ORAL_CAPSULE | Freq: Three times a day (TID) | ORAL | 0 refills | Status: DC | PRN

## 2016-07-04 NOTE — Progress Notes (Signed)
Patient: Ashley Mccullough Female    DOB: 05-09-1961   55 y.o.   MRN: [id number] Visit Date: 07/04/2016  Today's Provider: Trinna Post, PA-C   Chief Complaint  Patient presents with  . Sinusitis    Started about 10 days ago.   Subjective:    Sinusitis  This is a new problem. The current episode started 1 to 4 weeks ago (Started about 10 days ago.). The problem has been gradually worsening since onset. There has been no fever. Associated symptoms include congestion, coughing, headaches, sinus pressure and a sore throat. Pertinent negatives include no chills, diaphoresis, ear pain or shortness of breath. Treatments tried: Allegra, OTC Cough Medicine.   The treatment provided mild relief.    Abscess: Patient presents for evaluation of a cutaneous abscess. Lesion is located in the Left groin area.  Onset was approximately one month ago. She was treated with course of doxycycline x 14 days. Symptoms have gradually improved. Abscess has associated symptoms of spontaneous drainage. Patient does have previous history of cutaneous abscesses. Patient does not have diabetes.  But pt does have borderline diabetes. No leg redness, warmth.  Elevated Bp: BP in office elevated. Patient reports headache but no blurry vision, problems urinating. Patient reports moving her kitchen and having to eat out all of the time, which has impaired her lifestyle modifications.    Allergies  Allergen Reactions  . Simvastatin     Memory changes  . Sulfa Antibiotics      Current Outpatient Prescriptions:  .  buPROPion (WELLBUTRIN XL) 150 MG 24 hr tablet, Take 1 tablet by mouth  daily, Disp: 90 tablet, Rfl: 3 .  diltiazem (CARDIZEM CD) 240 MG 24 hr capsule, TAKE 1 CAPSULE BY MOUTH  DAILY, Disp: 90 capsule, Rfl: 1 .  fexofenadine (ALLEGRA) 180 MG tablet, TAKE 1 TABLET BY MOUTH DAILY, Disp: 90 tablet, Rfl: 3 .  FLUoxetine (PROZAC) 20 MG capsule, Take 3 capsules (60 mg total) by mouth daily., Disp: 270  capsule, Rfl: 3 .  losartan (COZAAR) 25 MG tablet, TAKE 1 TABLET BY MOUTH  EVERY DAY, Disp: 90 tablet, Rfl: 1 .  montelukast (SINGULAIR) 10 MG tablet, TAKE 1 TABLET BY MOUTH  DAILY, Disp: 90 tablet, Rfl: 1 .  Cholecalciferol (VITAMIN D3) 5000 units TABS, Take 5,000 tablets by mouth daily., Disp: , Rfl:  .  doxycycline (VIBRA-TABS) 100 MG tablet, Take 1 tablet (100 mg total) by mouth 2 (two) times daily. (Patient not taking: Reported on 07/04/2016), Disp: 14 tablet, Rfl: 0  Review of Systems  Constitutional: Positive for fatigue. Negative for activity change, appetite change, chills, diaphoresis, fever and unexpected weight change.  HENT: Positive for congestion, postnasal drip, sinus pressure and sore throat. Negative for ear discharge, ear pain, nosebleeds and rhinorrhea.   Eyes: Negative.   Respiratory: Positive for cough. Negative for apnea, choking, chest tightness, shortness of breath, wheezing and stridor.   Cardiovascular: Negative.   Gastrointestinal: Negative.   Skin: Positive for wound. Negative for color change, pallor and rash.  Allergic/Immunologic: Positive for environmental allergies.  Neurological: Positive for light-headedness and headaches. Negative for dizziness.    Social History  Substance Use Topics  . Smoking status: Never Smoker  . Smokeless tobacco: Never Used  . Alcohol use Yes   Objective:   BP (!) 162/98 (BP Location: Left Wrist, Patient Position: Sitting, Cuff Size: Normal)   Pulse 84   Temp 98.8 F (37.1 C) (Oral)   Resp 16  Wt (!) 314 lb (142.4 kg)   BMI 57.43 kg/m   Physical Exam  Constitutional: Vital signs are normal. She appears well-developed and well-nourished.  Non-toxic appearance. She does not have a sickly appearance. She does not appear ill. No distress.  HENT:  Right Ear: External ear normal.  Left Ear: External ear normal.  Ears:  Nose: Nose normal. Right sinus exhibits no maxillary sinus tenderness and no frontal sinus tenderness.  Left sinus exhibits no maxillary sinus tenderness and no frontal sinus tenderness.  Mouth/Throat: Oropharynx is clear and moist. No oropharyngeal exudate.  Eyes: Right eye exhibits no discharge. Left eye exhibits no discharge.  Neck: Neck supple.  Cardiovascular: Normal rate and regular rhythm.   Pulmonary/Chest: Effort normal and breath sounds normal. No respiratory distress. She has no wheezes. She has no rales.  Lymphadenopathy:    She has no cervical adenopathy.  Neurological: She is alert.  Skin: Skin is warm and dry. Lesion and rash noted. No abrasion, no bruising, no ecchymosis and no petechiae noted. Rash is pustular. She is not diaphoretic. There is erythema.     Psychiatric: She has a normal mood and affect. Her behavior is normal.  Vitals reviewed.       Assessment & Plan:      Problem List Items Addressed This Visit    Hypertension    Other Visit Diagnoses    Carbuncle    -  Primary   Relevant Orders   Aerobic Culture   Acute non-recurrent sinusitis, unspecified location       Relevant Medications   benzonatate (TESSALON) 100 MG capsule   Cough       Relevant Medications   benzonatate (TESSALON) 100 MG capsule     Sinusitis: pt willing to wait on antibiotics. Will try taking Zyrtec, which normally helps her with these symptoms. Will call back if she does not improve.  Elevated Bp: offered to increase anti-HTN. Patient declines and wants to work on lifestyle intervention. No signs hypertensive emergency. Will return in one month to recheck.   Carbuncle: cx today in office. Will await sensitivities to see if abx is needed for resistant organism. Lesion actively draining and seems to be improving. Instructed patient to keep area dry, may do epsom salt soaks.   I have spent 25 minutes with this patient, >50% was spent on counseling and coordination.   Return in about 4 weeks (around 08/01/2016) for blood pressure .  The entirety of the information documented in  the History of Present Illness, Review of Systems and Physical Exam were personally obtained by me. Portions of this information were initially documented by Ashley Royalty, CMA and reviewed by me for thoroughness and accuracy.    Patient Instructions  Abscess An abscess is an infected area that contains a collection of pus and debris.It can occur in almost any part of the body. An abscess is also known as a furuncle or boil. CAUSES  An abscess occurs when tissue gets infected. This can occur from blockage of oil or sweat glands, infection of hair follicles, or a minor injury to the skin. As the body tries to fight the infection, pus collects in the area and creates pressure under the skin. This pressure causes pain. People with weakened immune systems have difficulty fighting infections and get certain abscesses more often.  SYMPTOMS Usually an abscess develops on the skin and becomes a painful mass that is red, warm, and tender. If the abscess forms under the skin, you may  feel a moveable soft area under the skin. Some abscesses break open (rupture) on their own, but most will continue to get worse without care. The infection can spread deeper into the body and eventually into the bloodstream, causing you to feel ill.  DIAGNOSIS  Your caregiver will take your medical history and perform a physical exam. A sample of fluid may also be taken from the abscess to determine what is causing your infection. TREATMENT  Your caregiver may prescribe antibiotic medicines to fight the infection. However, taking antibiotics alone usually does not cure an abscess. Your caregiver may need to make a small cut (incision) in the abscess to drain the pus. In some cases, gauze is packed into the abscess to reduce pain and to continue draining the area. HOME CARE INSTRUCTIONS   Only take over-the-counter or prescription medicines for pain, discomfort, or fever as directed by your caregiver.  If you were prescribed  antibiotics, take them as directed. Finish them even if you start to feel better.  If gauze is used, follow your caregiver's directions for changing the gauze.  To avoid spreading the infection:  Keep your draining abscess covered with a bandage.  Wash your hands well.  Do not share personal care items, towels, or whirlpools with others.  Avoid skin contact with others.  Keep your skin and clothes clean around the abscess.  Keep all follow-up appointments as directed by your caregiver. SEEK MEDICAL CARE IF:   You have increased pain, swelling, redness, fluid drainage, or bleeding.  You have muscle aches, chills, or a general ill feeling.  You have a fever. MAKE SURE YOU:   Understand these instructions.  Will watch your condition.  Will get help right away if you are not doing well or get worse.   This information is not intended to replace advice given to you by your health care provider. Make sure you discuss any questions you have with your health care provider.   Document Released: 06/11/2005 Document Revised: 03/02/2012 Document Reviewed: 11/14/2011 Elsevier Interactive Patient Education 2016 [facility]

## 2016-07-04 NOTE — Patient Instructions (Signed)

## 2016-07-07 ENCOUNTER — Telehealth: Payer: Self-pay

## 2016-07-07 LAB — AEROBIC CULTURE

## 2016-07-07 NOTE — Telephone Encounter (Signed)
-----   Message from Trinna Post, [place] sent at 07/07/2016  8:44 AM EDT ----- Abscess culture came back positive for Proteus mirabilis, common for skin infections in this area. It is resistant to doxycycline, which may explain persistence after taking this antibiotic last month. If lesion is self draining and appearing to subside and if patient does not have spreading warmth, fever, chills, she can hold off on antibiotics for now. If lesion is still persisting, may consider putting on different antibiotics.

## 2016-07-07 NOTE — Telephone Encounter (Signed)
LMTCB 07/07/2016   Thanks,   -Mickel Baas

## 2016-07-08 NOTE — Telephone Encounter (Signed)
Pt advised.   Thanks,   -Laura  

## 2016-07-14 ENCOUNTER — Encounter: Payer: Self-pay | Admitting: Physician Assistant

## 2016-07-14 ENCOUNTER — Ambulatory Visit (INDEPENDENT_AMBULATORY_CARE_PROVIDER_SITE_OTHER): Payer: 59 | Admitting: Physician Assistant

## 2016-07-14 ENCOUNTER — Telehealth: Payer: Self-pay | Admitting: Physician Assistant

## 2016-07-14 VITALS — BP 146/88 | HR 88 | Temp 99.4°F | Resp 16 | Wt 312.0 lb

## 2016-07-14 DIAGNOSIS — L0293 Carbuncle, unspecified: Secondary | ICD-10-CM

## 2016-07-14 DIAGNOSIS — F439 Reaction to severe stress, unspecified: Secondary | ICD-10-CM | POA: Diagnosis not present

## 2016-07-14 DIAGNOSIS — J329 Chronic sinusitis, unspecified: Secondary | ICD-10-CM | POA: Diagnosis not present

## 2016-07-14 DIAGNOSIS — L0292 Furuncle, unspecified: Secondary | ICD-10-CM

## 2016-07-14 MED ORDER — AMOXICILLIN-POT CLAVULANATE 875-125 MG PO TABS: 1.0000 | ORAL_TABLET | Freq: Two times a day (BID) | ORAL | 0 refills | Status: DC

## 2016-07-14 MED ORDER — HYDROCODONE-HOMATROPINE 5-1.5 MG/5ML PO SYRP: 5.0000 mL | ORAL_SOLUTION | Freq: Three times a day (TID) | ORAL | 0 refills | Status: DC | PRN

## 2016-07-14 NOTE — Telephone Encounter (Signed)
Pt thinks she needs a different or new antiobiotic for the boil that she came to see Fabio Bering for a couple of weeks ago.  She says it has not cleared up and she has a couple more.  She also has a cough lingering for a couple weeks  She uses [facility].  Her call back is [phone] [phone]  Thanks Con Memos

## 2016-07-14 NOTE — Progress Notes (Signed)
Patient: Ashley Mccullough Female    DOB: 1961/01/17   55 y.o.   MRN: [id number] Visit Date: 07/14/2016  Today's Provider: Trinna Post, PA-C   Chief Complaint  Patient presents with  . Abscess  . Cough    Started about 10 days ago.   Subjective:    Cough  This is a new problem. The current episode started 1 to 4 weeks ago. The problem has been gradually worsening. The cough is productive of sputum. Associated symptoms include headaches, postnasal drip and a sore throat. Pertinent negatives include no chills, ear pain, fever, rash, rhinorrhea, shortness of breath or wheezing. Her past medical history is significant for environmental allergies. There is no history of asthma or COPD.    Abscess: Patient presents for evaluation of a cutaneous abscess. Lesion is located in the Lower abdomen. Onset was 20 days ago. Symptoms have gradually worsened. Abscess has associated symptoms of spontaneous drainage, pain. Patient does have previous history of cutaneous abscesses. Patient does not have diabetes.  Patient seen in office on 07/04/2016 with sinusitis and inguinal carbuncle is presenting today with worsening of the same. She reports persistent sinus symptoms as listed above, ongoing >10days.   She also presents with persistent carbuncle in left inguinal region. She also describes a new carbuncle on her right lower abdomen and left gluteal fold that were draining yesterday but are improved today. When first seen for this problem in clinic almost a month ago, she was treated with ten days of doxycycline. Recent culture of carbuncle showed resistance to doxycycline, but sensitivities to other all other antibiotics. Patient reports she used to see a physician who had her take an antibiotic when she starter her period for this issue, but cannot remember the name of the drug.  Patient reports significant stressors including moving, tough work environment, and a hospitalized mother.      Allergies  Allergen Reactions  . Simvastatin     Memory changes  . Sulfa Antibiotics      Current Outpatient Prescriptions:  .  buPROPion (WELLBUTRIN XL) 150 MG 24 hr tablet, Take 1 tablet by mouth  daily, Disp: 90 tablet, Rfl: 3 .  Cholecalciferol (VITAMIN D3) 5000 units TABS, Take 5,000 tablets by mouth daily., Disp: , Rfl:  .  diltiazem (CARDIZEM CD) 240 MG 24 hr capsule, TAKE 1 CAPSULE BY MOUTH  DAILY, Disp: 90 capsule, Rfl: 1 .  fexofenadine (ALLEGRA) 180 MG tablet, TAKE 1 TABLET BY MOUTH DAILY, Disp: 90 tablet, Rfl: 3 .  FLUoxetine (PROZAC) 20 MG capsule, Take 3 capsules (60 mg total) by mouth daily., Disp: 270 capsule, Rfl: 3 .  losartan (COZAAR) 25 MG tablet, TAKE 1 TABLET BY MOUTH  EVERY DAY, Disp: 90 tablet, Rfl: 1 .  montelukast (SINGULAIR) 10 MG tablet, TAKE 1 TABLET BY MOUTH  DAILY, Disp: 90 tablet, Rfl: 1 .  amoxicillin-clavulanate (AUGMENTIN) 875-125 MG tablet, Take 1 tablet by mouth 2 (two) times daily., Disp: 20 tablet, Rfl: 0 .  HYDROcodone-homatropine (HYCODAN) 5-1.5 MG/5ML syrup, Take 5 mLs by mouth every 8 (eight) hours as needed for cough., Disp: 120 mL, Rfl: 0  Review of Systems  Constitutional: Positive for diaphoresis and fatigue. Negative for activity change, appetite change, chills, fever and unexpected weight change.  HENT: Positive for congestion, postnasal drip, sinus pressure, sore throat and trouble swallowing. Negative for ear discharge, ear pain, nosebleeds, rhinorrhea and tinnitus.   Eyes: Negative.   Respiratory: Positive for cough.  Negative for apnea, choking, chest tightness, shortness of breath, wheezing and stridor.   Cardiovascular: Negative.   Gastrointestinal: Positive for nausea. Negative for abdominal distention, abdominal pain, anal bleeding, blood in stool, constipation, diarrhea, rectal pain and vomiting.  Skin: Positive for wound. Negative for color change, pallor and rash.  Allergic/Immunologic: Positive for environmental allergies.   Neurological: Positive for light-headedness and headaches. Negative for dizziness.    Social History  Substance Use Topics  . Smoking status: Never Smoker  . Smokeless tobacco: Never Used  . Alcohol use Yes   Objective:   BP (!) 146/88 (BP Location: Left Wrist, Patient Position: Sitting, Cuff Size: Large)   Pulse 88   Temp 99.4 F (37.4 C) (Oral)   Resp 16   Wt (!) 312 lb (141.5 kg)   BMI 57.07 kg/m   Physical Exam  Constitutional: She is oriented to person, place, and time. She appears well-developed and well-nourished. No distress.  HENT:  Tympanic membranes dull bilaterally   Eyes: Conjunctivae are normal.  Neck: Normal range of motion. Neck supple.  Cardiovascular: Normal rate and regular rhythm.   Pulmonary/Chest: Effort normal and breath sounds normal.  Lymphadenopathy:    She has no cervical adenopathy.  Neurological: She is oriented to person, place, and time.  Skin: Skin is warm. Lesion and rash noted. Rash is pustular. There is erythema.     Psychiatric: She has a normal mood and affect. Her behavior is normal.        Assessment & Plan:      Problem List Items Addressed This Visit    Carbuncle and furuncle    Other Visit Diagnoses    Sinusitis, unspecified chronicity, unspecified location    -  Primary   Relevant Medications   amoxicillin-clavulanate (AUGMENTIN) 875-125 MG tablet   HYDROcodone-homatropine (HYCODAN) 5-1.5 MG/5ML syrup   Stress       Relevant Orders   Ambulatory referral to [facility]      Patient is a 55 y/o female with recurring carbuncles and persistent sinusitis. Micro reveals sensitivity to Augmentin, so will use this to treat skin lesions and also sinusitis. Hycodan cough syrup given since [employer] were not working.   Return if symptoms worsen or fail to improve.   Patient Instructions  Sinusitis, Adult Sinusitis is redness, soreness, and inflammation of the paranasal sinuses. Paranasal sinuses are air pockets  within the bones of your face. They are located beneath your eyes, in the middle of your forehead, and above your eyes. In healthy paranasal sinuses, mucus is able to drain out, and air is able to circulate through them by way of your nose. However, when your paranasal sinuses are inflamed, mucus and air can become trapped. This can allow bacteria and other germs to grow and cause infection. Sinusitis can develop quickly and last only a short time (acute) or continue over a long period (chronic). Sinusitis that lasts for more than 12 weeks is considered chronic. CAUSES Causes of sinusitis include:  Allergies.  Structural abnormalities, such as displacement of the cartilage that separates your nostrils (deviated septum), which can decrease the air flow through your nose and sinuses and affect sinus drainage.  Functional abnormalities, such as when the small hairs (cilia) that line your sinuses and help remove mucus do not work properly or are not present. SIGNS AND SYMPTOMS Symptoms of acute and chronic sinusitis are the same. The primary symptoms are pain and pressure around the affected sinuses. Other symptoms include:  Upper toothache.  Earache.  Headache.  Bad breath.  Decreased sense of smell and taste.  A cough, which worsens when you are lying flat.  Fatigue.  Fever.  Thick drainage from your nose, which often is green and may contain pus (purulent).  Swelling and warmth over the affected sinuses. DIAGNOSIS Your health care provider will perform a physical exam. During your exam, your health care provider may perform any of the following to help determine if you have acute sinusitis or chronic sinusitis:  Look in your nose for signs of abnormal growths in your nostrils (nasal polyps).  Tap over the affected sinus to check for signs of infection.  View the inside of your sinuses using an imaging device that has a light attached (endoscope). If your health care provider  suspects that you have chronic sinusitis, one or more of the following tests may be recommended:  Allergy tests.  Nasal culture. A sample of mucus is taken from your nose, sent to a lab, and screened for bacteria.  Nasal cytology. A sample of mucus is taken from your nose and examined by your health care provider to determine if your sinusitis is related to an allergy. TREATMENT Most cases of acute sinusitis are related to a viral infection and will resolve on their own within 10 days. Sometimes, medicines are prescribed to help relieve symptoms of both acute and chronic sinusitis. These may include pain medicines, decongestants, nasal steroid sprays, or saline sprays. However, for sinusitis related to a bacterial infection, your health care provider will prescribe antibiotic medicines. These are medicines that will help kill the bacteria causing the infection. Rarely, sinusitis is caused by a fungal infection. In these cases, your health care provider will prescribe antifungal medicine. For some cases of chronic sinusitis, surgery is needed. Generally, these are cases in which sinusitis recurs more than 3 times per year, despite other treatments. HOME CARE INSTRUCTIONS  Drink plenty of water. Water helps thin the mucus so your sinuses can drain more easily.  Use a humidifier.  Inhale steam 3-4 times a day (for example, sit in the bathroom with the shower running).  Apply a warm, moist washcloth to your face 3-4 times a day, or as directed by your health care provider.  Use saline nasal sprays to help moisten and clean your sinuses.  Take medicines only as directed by your health care provider.  If you were prescribed either an antibiotic or antifungal medicine, finish it all even if you start to feel better. SEEK IMMEDIATE MEDICAL CARE IF:  You have increasing pain or severe headaches.  You have nausea, vomiting, or drowsiness.  You have swelling around your face.  You have vision  problems.  You have a stiff neck.  You have difficulty breathing.   This information is not intended to replace advice given to you by your health care provider. Make sure you discuss any questions you have with your health care provider.   Document Released: 09/01/2005 Document Revised: 09/22/2014 Document Reviewed: 09/16/2011 Elsevier Interactive Patient Education [employer].   The entirety of the information documented in the History of Present Illness, Review of Systems and Physical Exam were personally obtained by me. Portions of this information were initially documented by Ashley Royalty, CMA and reviewed by me for thoroughness and accuracy.           Trinna Post, PA-C  [facility]

## 2016-07-14 NOTE — Patient Instructions (Signed)

## 2016-07-23 ENCOUNTER — Telehealth: Payer: Self-pay | Admitting: Physician Assistant

## 2016-07-23 NOTE — Telephone Encounter (Signed)
lvm for pt to call back to discuss CRR from A. Pollock - knb

## 2016-08-01 ENCOUNTER — Ambulatory Visit: Payer: 59 | Admitting: Physician Assistant

## 2016-09-01 ENCOUNTER — Other Ambulatory Visit: Payer: Self-pay | Admitting: Physician Assistant

## 2016-09-01 DIAGNOSIS — J309 Allergic rhinitis, unspecified: Secondary | ICD-10-CM

## 2016-09-01 DIAGNOSIS — I10 Essential (primary) hypertension: Secondary | ICD-10-CM

## 2016-12-22 ENCOUNTER — Ambulatory Visit: Payer: 59 | Admitting: Physician Assistant

## 2017-01-19 ENCOUNTER — Other Ambulatory Visit: Payer: Self-pay | Admitting: Physician Assistant

## 2017-01-19 DIAGNOSIS — F329 Major depressive disorder, single episode, unspecified: Secondary | ICD-10-CM

## 2017-01-19 DIAGNOSIS — F32A Depression, unspecified: Secondary | ICD-10-CM

## 2017-04-09 ENCOUNTER — Other Ambulatory Visit: Payer: Self-pay | Admitting: Physician Assistant

## 2017-04-09 DIAGNOSIS — F411 Generalized anxiety disorder: Secondary | ICD-10-CM

## 2017-05-05 ENCOUNTER — Ambulatory Visit: Admission: RE | Admit: 2017-05-05 | Payer: 59 | Source: Ambulatory Visit | Admitting: Gastroenterology

## 2017-05-05 ENCOUNTER — Encounter: Admission: RE | Payer: Self-pay | Source: Ambulatory Visit

## 2017-05-05 SURGERY — COLONOSCOPY WITH PROPOFOL
Anesthesia: General

## 2017-07-01 ENCOUNTER — Encounter: Payer: Self-pay | Admitting: Physician Assistant

## 2017-07-01 ENCOUNTER — Ambulatory Visit (INDEPENDENT_AMBULATORY_CARE_PROVIDER_SITE_OTHER): Payer: 59 | Admitting: Physician Assistant

## 2017-07-01 VITALS — BP 140/82 | HR 72 | Temp 98.0°F | Ht 62.0 in | Wt 315.4 lb

## 2017-07-01 DIAGNOSIS — Z23 Encounter for immunization: Secondary | ICD-10-CM | POA: Diagnosis not present

## 2017-07-01 DIAGNOSIS — F330 Major depressive disorder, recurrent, mild: Secondary | ICD-10-CM | POA: Diagnosis not present

## 2017-07-01 DIAGNOSIS — Z6841 Body Mass Index (BMI) 40.0 and over, adult: Secondary | ICD-10-CM

## 2017-07-01 DIAGNOSIS — Z1231 Encounter for screening mammogram for malignant neoplasm of breast: Secondary | ICD-10-CM

## 2017-07-01 DIAGNOSIS — Z0000 Encounter for general adult medical examination without abnormal findings: Secondary | ICD-10-CM | POA: Diagnosis not present

## 2017-07-01 DIAGNOSIS — E559 Vitamin D deficiency, unspecified: Secondary | ICD-10-CM | POA: Diagnosis not present

## 2017-07-01 DIAGNOSIS — R7989 Other specified abnormal findings of blood chemistry: Secondary | ICD-10-CM

## 2017-07-01 DIAGNOSIS — Z1211 Encounter for screening for malignant neoplasm of colon: Secondary | ICD-10-CM | POA: Diagnosis not present

## 2017-07-01 DIAGNOSIS — R7303 Prediabetes: Secondary | ICD-10-CM | POA: Diagnosis not present

## 2017-07-01 DIAGNOSIS — I10 Essential (primary) hypertension: Secondary | ICD-10-CM | POA: Diagnosis not present

## 2017-07-01 DIAGNOSIS — Z1159 Encounter for screening for other viral diseases: Secondary | ICD-10-CM | POA: Diagnosis not present

## 2017-07-01 DIAGNOSIS — Z1239 Encounter for other screening for malignant neoplasm of breast: Secondary | ICD-10-CM

## 2017-07-01 NOTE — Patient Instructions (Signed)
Health Maintenance for Postmenopausal Women Menopause is a normal process in which your reproductive ability comes to an end. This process happens gradually over a span of months to years, usually between the ages of 22 and 9. Menopause is complete when you have missed 12 consecutive menstrual periods. It is important to talk with your health care provider about some of the most common conditions that affect postmenopausal women, such as heart disease, cancer, and bone loss (osteoporosis). Adopting a healthy lifestyle and getting preventive care can help to promote your health and wellness. Those actions can also lower your chances of developing some of these common conditions. What should I know about menopause? During menopause, you may experience a number of symptoms, such as:  Moderate-to-severe hot flashes.  Night sweats.  Decrease in sex drive.  Mood swings.  Headaches.  Tiredness.  Irritability.  Memory problems.  Insomnia.  Choosing to treat or not to treat menopausal changes is an individual decision that you make with your health care provider. What should I know about hormone replacement therapy and supplements? Hormone therapy products are effective for treating symptoms that are associated with menopause, such as hot flashes and night sweats. Hormone replacement carries certain risks, especially as you become older. If you are thinking about using estrogen or estrogen with progestin treatments, discuss the benefits and risks with your health care provider. What should I know about heart disease and stroke? Heart disease, heart attack, and stroke become more likely as you age. This may be due, in part, to the hormonal changes that your body experiences during menopause. These can affect how your body processes dietary fats, triglycerides, and cholesterol. Heart attack and stroke are both medical emergencies. There are many things that you can do to help prevent heart disease  and stroke:  Have your blood pressure checked at least every 1-2 years. High blood pressure causes heart disease and increases the risk of stroke.  If you are 56-22 years old, ask your health care provider if you should take aspirin to prevent a heart attack or a stroke.  Do not use any tobacco products, including cigarettes, chewing tobacco, or electronic cigarettes. If you need help quitting, ask your health care provider.  It is important to eat a healthy diet and maintain a healthy weight. ? Be sure to include plenty of vegetables, fruits, low-fat dairy products, and lean protein. ? Avoid eating foods that are high in solid fats, added sugars, or salt (sodium).  Get regular exercise. This is one of the most important things that you can do for your health. ? Try to exercise for at least 150 minutes each week. The type of exercise that you do should increase your heart rate and make you sweat. This is known as moderate-intensity exercise. ? Try to do strengthening exercises at least twice each week. Do these in addition to the moderate-intensity exercise.  Know your numbers.Ask your health care provider to check your cholesterol and your blood glucose. Continue to have your blood tested as directed by your health care provider.  What should I know about cancer screening? There are several types of cancer. Take the following steps to reduce your risk and to catch any cancer development as early as possible. Breast Cancer  Practice breast self-awareness. ? This means understanding how your breasts normally appear and feel. ? It also means doing regular breast self-exams. Let your health care provider know about any changes, no matter how small.  If you are 40  or older, have a clinician do a breast exam (clinical breast exam or CBE) every year. Depending on your age, family history, and medical history, it may be recommended that you also have a yearly breast X-ray (mammogram).  If you  have a family history of breast cancer, talk with your health care provider about genetic screening.  If you are at high risk for breast cancer, talk with your health care provider about having an MRI and a mammogram every year.  Breast cancer (BRCA) gene test is recommended for women who have family members with BRCA-related cancers. Results of the assessment will determine the need for genetic counseling and BRCA1 and for BRCA2 testing. BRCA-related cancers include these types: ? Breast. This occurs in males or females. ? Ovarian. ? Tubal. This may also be called fallopian tube cancer. ? Cancer of the abdominal or pelvic lining (peritoneal cancer). ? Prostate. ? Pancreatic.  Cervical, Uterine, and Ovarian Cancer Your health care provider may recommend that you be screened regularly for cancer of the pelvic organs. These include your ovaries, uterus, and vagina. This screening involves a pelvic exam, which includes checking for microscopic changes to the surface of your cervix (Pap test).  For women ages 21-65, health care providers may recommend a pelvic exam and a Pap test every three years. For women ages 79-65, they may recommend the Pap test and pelvic exam, combined with testing for human papilloma virus (HPV), every five years. Some types of HPV increase your risk of cervical cancer. Testing for HPV may also be done on women of any age who have unclear Pap test results.  Other health care providers may not recommend any screening for nonpregnant women who are considered low risk for pelvic cancer and have no symptoms. Ask your health care provider if a screening pelvic exam is right for you.  If you have had past treatment for cervical cancer or a condition that could lead to cancer, you need Pap tests and screening for cancer for at least 20 years after your treatment. If Pap tests have been discontinued for you, your risk factors (such as having a new sexual partner) need to be  reassessed to determine if you should start having screenings again. Some women have medical problems that increase the chance of getting cervical cancer. In these cases, your health care provider may recommend that you have screening and Pap tests more often.  If you have a family history of uterine cancer or ovarian cancer, talk with your health care provider about genetic screening.  If you have vaginal bleeding after reaching menopause, tell your health care provider.  There are currently no reliable tests available to screen for ovarian cancer.  Lung Cancer Lung cancer screening is recommended for adults 69-62 years old who are at high risk for lung cancer because of a history of smoking. A yearly low-dose CT scan of the lungs is recommended if you:  Currently smoke.  Have a history of at least 30 pack-years of smoking and you currently smoke or have quit within the past 15 years. A pack-year is smoking an average of one pack of cigarettes per day for one year.  Yearly screening should:  Continue until it has been 15 years since you quit.  Stop if you develop a health problem that would prevent you from having lung cancer treatment.  Colorectal Cancer  This type of cancer can be detected and can often be prevented.  Routine colorectal cancer screening usually begins at  age 42 and continues through age 45.  If you have risk factors for colon cancer, your health care provider may recommend that you be screened at an earlier age.  If you have a family history of colorectal cancer, talk with your health care provider about genetic screening.  Your health care provider may also recommend using home test kits to check for hidden blood in your stool.  A small camera at the end of a tube can be used to examine your colon directly (sigmoidoscopy or colonoscopy). This is done to check for the earliest forms of colorectal cancer.  Direct examination of the colon should be repeated every  5-10 years until age 71. However, if early forms of precancerous polyps or small growths are found or if you have a family history or genetic risk for colorectal cancer, you may need to be screened more often.  Skin Cancer  Check your skin from head to toe regularly.  Monitor any moles. Be sure to tell your health care provider: ? About any new moles or changes in moles, especially if there is a change in a mole's shape or color. ? If you have a mole that is larger than the size of a pencil eraser.  If any of your family members has a history of skin cancer, especially at a young age, talk with your health care provider about genetic screening.  Always use sunscreen. Apply sunscreen liberally and repeatedly throughout the day.  Whenever you are outside, protect yourself by wearing long sleeves, pants, a wide-brimmed hat, and sunglasses.  What should I know about osteoporosis? Osteoporosis is a condition in which bone destruction happens more quickly than new bone creation. After menopause, you may be at an increased risk for osteoporosis. To help prevent osteoporosis or the bone fractures that can happen because of osteoporosis, the following is recommended:  If you are 46-71 years old, get at least 1,000 mg of calcium and at least 600 mg of vitamin D per day.  If you are older than age 55 but younger than age 65, get at least 1,200 mg of calcium and at least 600 mg of vitamin D per day.  If you are older than age 54, get at least 1,200 mg of calcium and at least 800 mg of vitamin D per day.  Smoking and excessive alcohol intake increase the risk of osteoporosis. Eat foods that are rich in calcium and vitamin D, and do weight-bearing exercises several times each week as directed by your health care provider. What should I know about how menopause affects my mental health? Depression may occur at any age, but it is more common as you become older. Common symptoms of depression  include:  Low or sad mood.  Changes in sleep patterns.  Changes in appetite or eating patterns.  Feeling an overall lack of motivation or enjoyment of activities that you previously enjoyed.  Frequent crying spells.  Talk with your health care provider if you think that you are experiencing depression. What should I know about immunizations? It is important that you get and maintain your immunizations. These include:  Tetanus, diphtheria, and pertussis (Tdap) booster vaccine.  Influenza every year before the flu season begins.  Pneumonia vaccine.  Shingles vaccine.  Your health care provider may also recommend other immunizations. This information is not intended to replace advice given to you by your health care provider. Make sure you discuss any questions you have with your health care provider. Document Released: 10/24/2005  Document Revised: 03/21/2016 Document Reviewed: 06/05/2015 Elsevier Interactive Patient Education  2018 [employer]  

## 2017-07-01 NOTE — Progress Notes (Signed)
Patient: Ashley Mccullough, Female    DOB: Jun 26, 1961, 56 y.o.   MRN: [id number] Visit Date: 07/01/2017  Today's Provider: Mar Daring, PA-C   Chief Complaint  Patient presents with  . Annual Exam   Subjective:    Annual physical exam Ashley Mccullough is a 56 y.o. female who presents today for health maintenance and complete physical. She feels fairly well. She reports exercising none. She reports she is sleeping poorly.   She reports feeling more lonely recently. She was talking with a man that was a friend from HS and was starting a relationship, but later found out he was married and called it off. She lives alone and has not had an intimate relationship since she was 63. Also her mother is recently put into an assisted living facility and is feeling somewhat guilty about putting her there. ----------------------------------------------------------------- Patient is requesting a biometric appeals form be completed for elevated BMI. Patient states she is not exercising at this time. She is not following any certain diet.    Review of Systems  Constitutional: Positive for fatigue.  HENT: Negative.   Eyes: Negative.   Respiratory: Negative.   Cardiovascular: Positive for leg swelling.  Gastrointestinal: Negative.   Endocrine: Negative.   Genitourinary: Positive for flank pain and urgency.  Musculoskeletal: Positive for arthralgias.  Skin: Negative.   Neurological: Negative.   Hematological: Negative.   Psychiatric/Behavioral: Negative.     Social History      She  reports that she has never smoked. She has never used smokeless tobacco. She reports that she drinks alcohol. She reports that she does not use drugs.       Social History   Social History  . Marital status: Divorced    Spouse name: N/A  . Number of children: N/A  . Years of education: N/A   Social History Main Topics  . Smoking status: Never Smoker  . Smokeless tobacco: Never Used  . Alcohol use Yes    . Drug use: No  . Sexual activity: Not Asked   Other Topics Concern  . None   Social History Narrative  . None    Past Medical History:  Diagnosis Date  . Allergy   . Anxiety   . Depression   . Hyperlipidemia   . Hypertension      Patient Active Problem List   Diagnosis Date Noted  . Morbid obesity due to excess calories ([place]) 12/24/2015  . Allergic rhinitis 05/29/2015  . Anxiety, generalized 05/29/2015  . D (diarrhea) 05/29/2015  . Avitaminosis D 05/29/2015  . Borderline diabetes 05/29/2015  . Fatigue 05/29/2015  . Depression 03/15/2015  . Hypertension 03/15/2015  . Cannot sleep 11/13/2009  . Carbuncle and furuncle 03/13/2008  . Congenital pes planus 03/13/2008  . CD (contact dermatitis) 04/29/2007  . Hypercholesterolemia without hypertriglyceridemia 09/16/2006  . Arthropathia 08/11/2006  . Clinical depression 08/11/2006    Past Surgical History:  Procedure Laterality Date  . ABDOMINAL HYSTERECTOMY     still has ovaries    Family History        Family Status  Relation Status  . Mother Alive       pre diabetic  . Father Deceased at age 66       CAD  . Sister Alive  . Brother Alive  . Brother Alive  . [employer]  . Cousin Alive        Her family history includes Alcohol abuse in her father; Breast cancer  in her cousin, maternal aunt, and mother; Cancer in her mother; Depression in her father; Diabetes in her brother and brother; Heart disease in her father; Hypertension in her sister.     Allergies  Allergen Reactions  . Latex Itching  . Simvastatin     Other reaction(s): Unknown Memory changes Memory changes  . Sulfa Antibiotics     Other reaction(s): Unknown  . Levofloxacin Rash  . Prednisone Rash     Current Outpatient Prescriptions:  .  buPROPion (WELLBUTRIN XL) 150 MG 24 hr tablet, TAKE 1 TABLET BY MOUTH EVERY DAY, Disp: 90 tablet, Rfl: 1 .  Cholecalciferol (VITAMIN D3) 5000 units TABS, Take 5,000 tablets by mouth daily., Disp: ,  Rfl:  .  diltiazem (CARDIZEM CD) 240 MG 24 hr capsule, TAKE 1 CAPSULE BY MOUTH  DAILY, Disp: 90 capsule, Rfl: 3 .  fexofenadine (ALLEGRA) 180 MG tablet, TAKE 1 TABLET BY MOUTH DAILY, Disp: 90 tablet, Rfl: 3 .  FLUoxetine (PROZAC) 20 MG capsule, TAKE 3 CAPSULES BY MOUTH  DAILY, Disp: 270 capsule, Rfl: 1 .  losartan (COZAAR) 25 MG tablet, TAKE 1 TABLET BY MOUTH  EVERY DAY, Disp: 90 tablet, Rfl: 3 .  montelukast (SINGULAIR) 10 MG tablet, TAKE 1 TABLET BY MOUTH  DAILY, Disp: 90 tablet, Rfl: 3   Patient Care Team: Mar Daring, PA-C as PCP - General (Family Medicine)      Objective:   Vitals: BP 140/82 (BP Location: Left Arm, Patient Position: Sitting, Cuff Size: Large)   Pulse 72   Temp 98 F (36.7 C) (Oral)   Ht 5\' 2"  (1.575 m)   Wt (!) 315 lb 6.4 oz (143.1 kg)   SpO2 98%   BMI 57.69 kg/m    Vitals:   07/01/17 1019  BP: 140/82  Pulse: 72  Temp: 98 F (36.7 C)  TempSrc: Oral  SpO2: 98%  Weight: (!) 315 lb 6.4 oz (143.1 kg)  Height: 5\' 2"  (1.575 m)     Physical Exam  Constitutional: She is oriented to person, place, and time. She appears well-developed and well-nourished. No distress.  HENT:  Head: Normocephalic and atraumatic.  Right Ear: Hearing, tympanic membrane, external ear and ear canal normal.  Left Ear: Hearing, tympanic membrane, external ear and ear canal normal.  Nose: Nose normal.  Mouth/Throat: Uvula is midline, oropharynx is clear and moist and mucous membranes are normal. No oropharyngeal exudate.  Eyes: Pupils are equal, round, and reactive to light. Conjunctivae and EOM are normal. Right eye exhibits no discharge. Left eye exhibits no discharge. No scleral icterus.  Neck: Normal range of motion. Neck supple. No JVD present. No tracheal deviation present. No thyromegaly present.  Cardiovascular: Normal rate, regular rhythm, normal heart sounds and intact distal pulses.  Exam reveals no gallop and no friction rub.   No murmur heard. Pulmonary/Chest:  Effort normal and breath sounds normal. No respiratory distress. She has no wheezes. She has no rales. She exhibits no tenderness.  Abdominal: Soft. Bowel sounds are normal. She exhibits no distension and no mass. There is no tenderness. There is no rebound and no guarding.  Musculoskeletal: Normal range of motion. She exhibits no edema or tenderness.  Lymphadenopathy:    She has no cervical adenopathy.  Neurological: She is alert and oriented to person, place, and time.  Skin: Skin is warm and dry. No rash noted. She is not diaphoretic.  Psychiatric: She has a normal mood and affect. Her behavior is normal. Judgment and thought content normal.  Vitals reviewed.    Depression Screen No flowsheet data found.    Assessment & Plan:     Routine Health Maintenance and Physical Exam  Exercise Activities and Dietary recommendations Goals    None      Immunization History  Administered Date(s) Administered  . Influenza Split 08/11/2006  . Tdap 12/26/2009    Health Maintenance  Topic Date Due  . Hepatitis C Screening  1961-01-29  . HIV Screening  04/20/1976  . PAP SMEAR  04/20/1982  . COLONOSCOPY  04/21/2011  . INFLUENZA VACCINE  04/15/2017  . MAMMOGRAM  01/03/2018  . TETANUS/TDAP  12/27/2019     Discussed health benefits of physical activity, and encouraged her to engage in regular exercise appropriate for her age and condition.    1. Annual physical exam Normal physical exam today. Will check labs as below and f/u pending lab results. If labs are stable and WNL she will not need to have these rechecked for one year at her next annual physical exam. She is to call the office in the meantime if she has any acute issue, questions or concerns. - CBC w/Diff/Platelet - TSH  2. Breast cancer screening Breast exam not performed today at patient request. There is no family history of breast cancer. She does perform regular self breast exams. Mammogram was ordered as below.  Information for [facility] clinic was given to patient so she may schedule her mammogram at her convenience. - MM Digital Screening; Future  3. Colon cancer screening Patient has canceled colonoscopies in the past as she has no one to take her and pick her up for it. Never had colonoscopy. No family history of colon cancer. Will get cologuard instead. I will f/u pending results.  - Cologuard  4. Essential hypertension Stable today. Continue losartan 25mg . I will check BMP as below as labs with her biometric screen through her employer revealed an elevated Creatinine at 1.44, last year had been normal at 0.95. I will f/u pending results.  - Basic Metabolic Panel (BMET)  5. Mild episode of recurrent major depressive disorder (HCC) Stable on Prozac 20mg .   6. Avitaminosis D H/O this. Taking 5000 IU daily. Will check labs as below and f/u pending results. - Vitamin D (25 hydroxy)  7. Morbid obesity due to excess calories ([place]) Discussed weight loss, calorie counting (1200-1500 calories per day), adding water aerobics for exercise that will be less impact on her joints, or light walking.   8. Borderline diabetes A1c improved to 5.6 per biometric screening.  - Basic Metabolic Panel (BMET)  9. BMI 50.0-59.9, adult [facility]) See above medical treatment plan for #7.  10. Elevated serum creatinine See above medical treatment plan for #4.  - Basic Metabolic Panel (BMET)  11. Need for hepatitis C screening test - Hepatitis C Antibody  12. Need for influenza vaccination Flu vaccine given today without complication. Patient sat upright for 15 minutes to check for adverse reaction before being released. - Flu Vaccine QUAD 6+ mos PF IM (Fluarix Quad PF)  --------------------------------------------------------------------    Mar Daring, PA-C  [facility]

## 2017-07-06 ENCOUNTER — Telehealth: Payer: Self-pay | Admitting: Physician Assistant

## 2017-07-06 NOTE — Telephone Encounter (Signed)
Order for cologuard faxed to [facility] °

## 2017-07-14 ENCOUNTER — Other Ambulatory Visit: Payer: Self-pay | Admitting: Physician Assistant

## 2017-07-14 DIAGNOSIS — F411 Generalized anxiety disorder: Secondary | ICD-10-CM

## 2017-07-14 NOTE — Telephone Encounter (Signed)
[facility] faxed a request for the following medication. Thanks CC  buPROPion (WELLBUTRIN XL) 150 MG 24 hr tablet

## 2017-07-15 MED ORDER — BUPROPION HCL ER (XL) 150 MG PO TB24: 150.0000 mg | ORAL_TABLET | Freq: Every day | ORAL | 1 refills | Status: DC

## 2017-08-11 ENCOUNTER — Telehealth: Payer: Self-pay

## 2017-08-11 LAB — BASIC METABOLIC PANEL
BUN/Creatinine Ratio: 21 (ref 9–23)
BUN: 26 mg/dL — ABNORMAL HIGH (ref 6–24)
CALCIUM: 9.4 mg/dL (ref 8.7–10.2)
CHLORIDE: 101 mmol/L (ref 96–106)
CO2: 21 mmol/L (ref 20–29)
Creatinine, Ser: 1.26 mg/dL — ABNORMAL HIGH (ref 0.57–1.00)
GFR calc Af Amer: 55 mL/min/{1.73_m2} — ABNORMAL LOW (ref >59)
GFR calc non Af Amer: 48 mL/min/{1.73_m2} — ABNORMAL LOW (ref >59)
Glucose: 85 mg/dL (ref 65–99)
POTASSIUM: 4.6 mmol/L (ref 3.5–5.2)
Sodium: 138 mmol/L (ref 134–144)

## 2017-08-11 LAB — CBC WITH DIFFERENTIAL/PLATELET
BASOS ABS: 0 10*3/uL (ref 0.0–0.2)
Basos: 0 %
EOS (ABSOLUTE): 0.2 10*3/uL (ref 0.0–0.4)
Eos: 2 %
HEMOGLOBIN: 12.1 g/dL (ref 11.1–15.9)
Hematocrit: 37.7 % (ref 34.0–46.6)
IMMATURE GRANS (ABS): 0 10*3/uL (ref 0.0–0.1)
Immature Granulocytes: 0 %
LYMPHS: 31 %
Lymphocytes Absolute: 2.5 10*3/uL (ref 0.7–3.1)
MCH: 28 pg (ref 26.6–33.0)
MCHC: 32.1 g/dL (ref 31.5–35.7)
MCV: 87 fL (ref 79–97)
MONOCYTES: 5 %
Monocytes Absolute: 0.4 10*3/uL (ref 0.1–0.9)
NEUTROS ABS: 4.8 10*3/uL (ref 1.4–7.0)
Neutrophils: 62 %
PLATELETS: 292 10*3/uL (ref 150–379)
RBC: 4.32 x10E6/uL (ref 3.77–5.28)
RDW: 14.3 % (ref 12.3–15.4)
WBC: 7.9 10*3/uL (ref 3.4–10.8)

## 2017-08-11 LAB — TSH: TSH: 2.13 u[IU]/mL (ref 0.450–4.500)

## 2017-08-11 LAB — HEPATITIS C ANTIBODY: Hep C Virus Ab: 0.1 s/co ratio (ref 0.0–0.9)

## 2017-08-11 LAB — VITAMIN D 25 HYDROXY (VIT D DEFICIENCY, FRACTURES): VIT D 25 HYDROXY: 47.7 ng/mL (ref 30.0–100.0)

## 2017-08-11 NOTE — Telephone Encounter (Signed)
-----   Message from Mar Daring, [place] sent at 08/11/2017 10:12 AM EST ----- All labs are within normal limits and stable with exception of kidney function which is just slightly decreased when compared to last year. This can be secondary to dehydration for fasting for labs. I would recommend to push fluids and recheck in 2-3 weeks.  Thanks! -JB

## 2017-08-11 NOTE — Telephone Encounter (Signed)
LMTCB  Thanks,  -Ahad Colarusso 

## 2017-08-11 NOTE — Telephone Encounter (Signed)
Patient advised as below.  

## 2017-09-02 ENCOUNTER — Telehealth: Payer: Self-pay

## 2017-09-02 NOTE — Telephone Encounter (Signed)
Lm reminding pt to do Cologuard.

## 2017-10-15 ENCOUNTER — Other Ambulatory Visit: Payer: Self-pay | Admitting: Physician Assistant

## 2017-10-15 DIAGNOSIS — F329 Major depressive disorder, single episode, unspecified: Secondary | ICD-10-CM

## 2017-10-15 DIAGNOSIS — J309 Allergic rhinitis, unspecified: Secondary | ICD-10-CM

## 2017-10-15 DIAGNOSIS — I10 Essential (primary) hypertension: Secondary | ICD-10-CM

## 2017-10-15 DIAGNOSIS — F32A Depression, unspecified: Secondary | ICD-10-CM

## 2017-12-30 ENCOUNTER — Other Ambulatory Visit: Payer: Self-pay | Admitting: Physician Assistant

## 2017-12-30 DIAGNOSIS — F411 Generalized anxiety disorder: Secondary | ICD-10-CM

## 2018-03-01 DIAGNOSIS — M62838 Other muscle spasm: Secondary | ICD-10-CM | POA: Diagnosis not present

## 2018-03-03 ENCOUNTER — Encounter: Payer: Self-pay | Admitting: Physician Assistant

## 2018-03-03 ENCOUNTER — Ambulatory Visit (INDEPENDENT_AMBULATORY_CARE_PROVIDER_SITE_OTHER): Payer: BLUE CROSS/BLUE SHIELD | Admitting: Physician Assistant

## 2018-03-03 VITALS — BP 130/100 | HR 72 | Temp 98.7°F | Resp 16 | Wt 332.0 lb

## 2018-03-03 DIAGNOSIS — M5431 Sciatica, right side: Secondary | ICD-10-CM | POA: Diagnosis not present

## 2018-03-03 DIAGNOSIS — M62838 Other muscle spasm: Secondary | ICD-10-CM | POA: Diagnosis not present

## 2018-03-03 DIAGNOSIS — Z6841 Body Mass Index (BMI) 40.0 and over, adult: Secondary | ICD-10-CM

## 2018-03-03 DIAGNOSIS — M5432 Sciatica, left side: Secondary | ICD-10-CM | POA: Diagnosis not present

## 2018-03-03 MED ORDER — METAXALONE 800 MG PO TABS: 400.0000 mg | ORAL_TABLET | Freq: Three times a day (TID) | ORAL | 1 refills | Status: DC | PRN

## 2018-03-03 MED ORDER — MELOXICAM 15 MG PO TABS: 15.0000 mg | ORAL_TABLET | Freq: Every day | ORAL | 0 refills | Status: DC

## 2018-03-03 NOTE — Progress Notes (Signed)
Patient: Ashley Mccullough Female    DOB: 05-27-1961   57 y.o.   MRN: [id number] Visit Date: 03/03/2018  Today's Provider: Mar Daring, PA-C   Chief Complaint  Patient presents with  . Muscle Pain   Subjective:    HPI Patient here today to follow up on Urgent Care visit on 03/01/18 due to muscle spasms on both legs. Patient was given Hydrocodone-acetaminophen and skelaxin. Patient reports that she took skelaxin this morning and Hydrocodone last night. Patient reports mild symptom control with medications. Patient reports that the way she sits at work may be affecting adding to her pain. She reports her chair and desk are not adjustable. She has noticed symptoms worsening since starting at [employer] [employer] [facility] [employer]. She states she used to get relief over the weekends previously but has progressed to even take Saturday before she is better. She reports at her previous job with the [employer] she had an ergonomic chair and desk and never had these issues.    Allergies  Allergen Reactions  . Latex Itching  . Simvastatin     Other reaction(s): Unknown Memory changes Memory changes  . Sulfa Antibiotics     Other reaction(s): Unknown  . Levofloxacin Rash  . Prednisone Rash     Current Outpatient Medications:  .  buPROPion (WELLBUTRIN XL) 150 MG 24 hr tablet, TAKE 1 TABLET BY MOUTH  DAILY, Disp: 90 tablet, Rfl: 1 .  Cetirizine HCl (ZYRTEC ALLERGY) 10 MG CAPS, Take by mouth., Disp: , Rfl:  .  Cholecalciferol (VITAMIN D3) 5000 units TABS, Take 5,000 tablets by mouth daily., Disp: , Rfl:  .  diltiazem (CARDIZEM CD) 240 MG 24 hr capsule, TAKE 1 CAPSULE BY MOUTH  DAILY, Disp: 90 capsule, Rfl: 3 .  fexofenadine (ALLEGRA) 180 MG tablet, TAKE 1 TABLET BY MOUTH DAILY, Disp: 90 tablet, Rfl: 3 .  FLUoxetine (PROZAC) 20 MG capsule, TAKE 3 CAPSULES BY MOUTH  DAILY, Disp: 270 capsule, Rfl: 1 .  HYDROcodone-acetaminophen (NORCO/VICODIN) 5-325 MG tablet, Take one tablet at night for pain; may take up to every  6 hours as needed for pain if not working or driving, Disp: , Rfl:  .  losartan (COZAAR) 25 MG tablet, TAKE 1 TABLET BY MOUTH  EVERY DAY, Disp: 90 tablet, Rfl: 3 .  metaxalone (SKELAXIN) 800 MG tablet, Take by mouth., Disp: , Rfl:  .  montelukast (SINGULAIR) 10 MG tablet, TAKE 1 TABLET BY MOUTH  DAILY, Disp: 90 tablet, Rfl: 3  Review of Systems  Constitutional: Negative.   Respiratory: Negative.   Cardiovascular: Negative.   Musculoskeletal: Positive for myalgias.    Social History   Tobacco Use  . Smoking status: Never Smoker  . Smokeless tobacco: Never Used  Substance Use Topics  . Alcohol use: Yes   Objective:   BP (!) 130/100 (BP Location: Left Wrist, Patient Position: Sitting, Cuff Size: Normal)   Pulse 72   Temp 98.7 F (37.1 C) (Oral)   Resp 16   Wt (!) 332 lb (150.6 kg)   SpO2 97%   BMI 60.72 kg/m  Vitals:   03/03/18 1125  BP: (!) 130/100  Pulse: 72  Resp: 16  Temp: 98.7 F (37.1 C)  TempSrc: Oral  SpO2: 97%  Weight: (!) 332 lb (150.6 kg)     Physical Exam  Constitutional: She appears well-developed and well-nourished. No distress.  Neck: Normal range of motion. Neck supple.  Cardiovascular: Normal rate, regular rhythm and normal heart sounds. Exam  reveals no gallop and no friction rub.  No murmur heard. Pulmonary/Chest: Effort normal and breath sounds normal. No respiratory distress. She has no wheezes. She has no rales.  Musculoskeletal:       Lumbar back: She exhibits decreased range of motion and spasm.       Back:  Skin: She is not diaphoretic.  Vitals reviewed.      Assessment & Plan:     1. Bilateral sciatica Continue skelaxin for muscle spasms. Add meloxicam for inflammation as noted below. Note given for ergonomic chair and desk at work as her current setup is her trigger for her spasms. Call if no improvements as patient may require imaging of lumbar spine and hips bilaterally as she may also have worsening arthritis due to body habitus  exacerbating symptoms.  - meloxicam (MOBIC) 15 MG tablet; Take 1 tablet (15 mg total) by mouth daily.  Dispense: 15 tablet; Refill: 0 - metaxalone (SKELAXIN) 800 MG tablet; Take 0.5-1 tablets (400-800 mg total) by mouth 3 (three) times daily as needed for muscle spasms.  Dispense: 30 tablet; Refill: 1  2. Muscle spasm See above medical treatment plan. - metaxalone (SKELAXIN) 800 MG tablet; Take 0.5-1 tablets (400-800 mg total) by mouth 3 (three) times daily as needed for muscle spasms.  Dispense: 30 tablet; Refill: 1  3. BMI 60.0-69.9, adult [facility]) See above medical treatment plan.       Mar Daring, PA-C  [facility]

## 2018-03-03 NOTE — Patient Instructions (Signed)
Meloxicam tablets What is this medicine? MELOXICAM (mel OX i cam) is a non-steroidal anti-inflammatory drug (NSAID). It is used to reduce swelling and to treat pain. It may be used for osteoarthritis, rheumatoid arthritis, or juvenile rheumatoid arthritis. This medicine may be used for other purposes; ask your health care provider or pharmacist if you have questions. COMMON BRAND NAME(S): Mobic What should I tell my health care provider before I take this medicine? They need to know if you have any of these conditions: -bleeding disorders -cigarette smoker -coronary artery bypass graft (CABG) surgery within the past 2 weeks -drink more than 3 alcohol-containing drinks per day -heart disease -high blood pressure -history of stomach bleeding -kidney disease -liver disease -lung or breathing disease, like asthma -stomach or intestine problems -an unusual or allergic reaction to meloxicam, aspirin, other NSAIDs, other medicines, foods, dyes, or preservatives -pregnant or trying to get pregnant -breast-feeding How should I use this medicine? Take this medicine by mouth with a full glass of water. Follow the directions on the prescription label. You can take it with or without food. If it upsets your stomach, take it with food. Take your medicine at regular intervals. Do not take it more often than directed. Do not stop taking except on your doctor's advice. A special MedGuide will be given to you by the pharmacist with each prescription and refill. Be sure to read this information carefully each time. Talk to your pediatrician regarding the use of this medicine in children. While this drug may be prescribed for selected conditions, precautions do apply. Patients over 54 years old may have a stronger reaction and need a smaller dose. Overdosage: If you think you have taken too much of this medicine contact a poison control center or emergency room at once. NOTE: This medicine is only for you. Do  not share this medicine with others. What if I miss a dose? If you miss a dose, take it as soon as you can. If it is almost time for your next dose, take only that dose. Do not take double or extra doses. What may interact with this medicine? Do not take this medicine with any of the following medications: -cidofovir -ketorolac This medicine may also interact with the following medications: -aspirin and aspirin-like medicines -certain medicines for blood pressure, heart disease, irregular heart beat -certain medicines for depression, anxiety, or psychotic disturbances -certain medicines that treat or prevent blood clots like warfarin, enoxaparin, dalteparin, apixaban, dabigatran, rivaroxaban -cyclosporine -digoxin -diuretics -methotrexate -other NSAIDs, medicines for pain and inflammation, like ibuprofen and naproxen -pemetrexed This list may not describe all possible interactions. Give your health care provider a list of all the medicines, herbs, non-prescription drugs, or dietary supplements you use. Also tell them if you smoke, drink alcohol, or use illegal drugs. Some items may interact with your medicine. What should I watch for while using this medicine? Tell your doctor or healthcare professional if your symptoms do not start to get better or if they get worse. Do not take other medicines that contain aspirin, ibuprofen, or naproxen with this medicine. Side effects such as stomach upset, nausea, or ulcers may be more likely to occur. Many medicines available without a prescription should not be taken with this medicine. This medicine can cause ulcers and bleeding in the stomach and intestines at any time during treatment. This can happen with no warning and may cause death. There is increased risk with taking this medicine for a long time. Smoking, drinking alcohol, older age,  and poor health can also increase risks. Call your doctor right away if you have stomach pain or blood in your  vomit or stool. This medicine does not prevent heart attack or stroke. In fact, this medicine may increase the chance of a heart attack or stroke. The chance may increase with longer use of this medicine and in people who have heart disease. If you take aspirin to prevent heart attack or stroke, talk with your doctor or health care professional. What side effects may I notice from receiving this medicine? Side effects that you should report to your doctor or health care professional as soon as possible: -allergic reactions like skin rash, itching or hives, swelling of the face, lips, or tongue -nausea, vomiting -signs and symptoms of a blood clot such as breathing problems; changes in vision; chest pain; severe, sudden headache; pain, swelling, warmth in the leg; trouble speaking; sudden numbness or weakness of the face, arm, or leg -signs and symptoms of bleeding such as bloody or black, tarry stools; red or dark-brown urine; spitting up blood or brown material that looks like coffee grounds; red spots on the skin; unusual bruising or bleeding from the eye, gums, or nose -signs and symptoms of liver injury like dark yellow or brown urine; general ill feeling or flu-like symptoms; light-colored stools; loss of appetite; nausea; right upper belly pain; unusually weak or tired; yellowing of the eyes or skin -signs and symptoms of stroke like changes in vision; confusion; trouble speaking or understanding; severe headaches; sudden numbness or weakness of the face, arm, or leg; trouble walking; dizziness; loss of balance or coordination Side effects that usually do not require medical attention (report to your doctor or health care professional if they continue or are bothersome): -constipation -diarrhea -gas This list may not describe all possible side effects. Call your doctor for medical advice about side effects. You may report side effects to FDA at 1-800-FDA-1088. Where should I keep my  medicine? Keep out of the reach of children. Store at room temperature between 15 and 30 degrees C (59 and 86 degrees F). Throw away any unused medicine after the expiration date. NOTE: This sheet is a summary. It may not cover all possible information. If you have questions about this medicine, talk to your doctor, pharmacist, or health care provider.  2018 Elsevier/Gold Standard (2015-10-03 19:28:16)

## 2018-03-09 ENCOUNTER — Telehealth: Payer: Self-pay | Admitting: Physician Assistant

## 2018-03-09 DIAGNOSIS — M62838 Other muscle spasm: Secondary | ICD-10-CM

## 2018-03-09 DIAGNOSIS — M5431 Sciatica, right side: Secondary | ICD-10-CM

## 2018-03-09 DIAGNOSIS — M5432 Sciatica, left side: Principal | ICD-10-CM

## 2018-03-09 NOTE — Telephone Encounter (Signed)
Pt was seen last week for a fu for muscle spasms.  She had originally went to a walk in .  She was prescribed  Metaxalone 800 mg and is out.  She wants to know if she can get a refill  She uses [facility] near [address]  Thanks teri

## 2018-03-10 MED ORDER — METAXALONE 800 MG PO TABS: 400.0000 mg | ORAL_TABLET | Freq: Three times a day (TID) | ORAL | 1 refills | Status: DC | PRN

## 2018-03-10 NOTE — Telephone Encounter (Signed)
Sent in

## 2018-03-16 ENCOUNTER — Other Ambulatory Visit: Payer: Self-pay | Admitting: Physician Assistant

## 2018-03-16 DIAGNOSIS — M5441 Lumbago with sciatica, right side: Secondary | ICD-10-CM

## 2018-03-16 NOTE — Telephone Encounter (Signed)
Please review. Ashley Mccullough is out of the office. Ok to send?

## 2018-03-16 NOTE — Telephone Encounter (Signed)
Pt would like a refill on hydrocodone 5-325.  She seen Sonia Baller for sciatica pain a week or so ago  She uses [facility] [address]  Pt's call back is [phone]  Thanks Con Memos

## 2018-03-17 MED ORDER — HYDROCODONE-ACETAMINOPHEN 5-325 MG PO TABS: ORAL_TABLET | ORAL | 0 refills | Status: DC

## 2018-03-17 NOTE — Telephone Encounter (Signed)
Up to you

## 2018-03-25 ENCOUNTER — Ambulatory Visit
Admission: RE | Admit: 2018-03-25 | Discharge: 2018-03-25 | Disposition: A | Discharge status: Home / Self Care | Payer: BLUE CROSS/BLUE SHIELD | Source: Ambulatory Visit | Attending: Physician Assistant | Admitting: Physician Assistant

## 2018-03-25 ENCOUNTER — Encounter: Payer: Self-pay | Admitting: Physician Assistant

## 2018-03-25 ENCOUNTER — Ambulatory Visit (INDEPENDENT_AMBULATORY_CARE_PROVIDER_SITE_OTHER): Payer: BLUE CROSS/BLUE SHIELD | Admitting: Physician Assistant

## 2018-03-25 ENCOUNTER — Telehealth: Payer: Self-pay

## 2018-03-25 VITALS — BP 160/100 | HR 94 | Temp 98.6°F | Resp 16 | Wt 328.4 lb

## 2018-03-25 DIAGNOSIS — M5136 Other intervertebral disc degeneration, lumbar region: Secondary | ICD-10-CM | POA: Insufficient documentation

## 2018-03-25 DIAGNOSIS — M5431 Sciatica, right side: Secondary | ICD-10-CM | POA: Diagnosis not present

## 2018-03-25 DIAGNOSIS — I10 Essential (primary) hypertension: Secondary | ICD-10-CM

## 2018-03-25 DIAGNOSIS — M5432 Sciatica, left side: Secondary | ICD-10-CM | POA: Insufficient documentation

## 2018-03-25 MED ORDER — LOSARTAN POTASSIUM 25 MG PO TABS: 50.0000 mg | ORAL_TABLET | Freq: Every day | ORAL | 3 refills | Status: DC

## 2018-03-25 MED ORDER — MELOXICAM 15 MG PO TABS: 15.0000 mg | ORAL_TABLET | Freq: Every day | ORAL | 0 refills | Status: DC

## 2018-03-25 NOTE — Progress Notes (Signed)
Patient: Ashley Mccullough Female    DOB: Dec 21, 1960   57 y.o.   MRN: [id number] Visit Date: 03/25/2018  Today's Provider: Mar Daring, PA-C   Chief Complaint  Patient presents with  . Follow-up    Sciatica   Subjective:    HPI Bilateral Sciatica: Patient is here to follow-up. She was advised to continue skelaxin for muscle spasm. Meloxicam was added for inflammation. Patient reports the Meloxicam helped but she ran out and didn't know whether to call for a refill. Patient also reports that she is still waiting on the ergonomic chair and desk at work. She does continue to use Norco 5-325mg  prn. She also reports she is working every other day basically because one day at work hurts her back enough that she has to stay home and take the [place] (can't drive or work while taking due to drowsiness) the following day. After a day of rest her pain is better for her to return to work, but then it flares right back up at work due to not being able to change position as she needs to.     Allergies  Allergen Reactions  . Latex Itching  . Simvastatin     Other reaction(s): Unknown Memory changes Memory changes  . Sulfa Antibiotics     Other reaction(s): Unknown  . Levofloxacin Rash  . Prednisone Rash     Current Outpatient Medications:  .  buPROPion (WELLBUTRIN XL) 150 MG 24 hr tablet, TAKE 1 TABLET BY MOUTH  DAILY, Disp: 90 tablet, Rfl: 1 .  Cetirizine HCl (ZYRTEC ALLERGY) 10 MG CAPS, Take by mouth., Disp: , Rfl:  .  Cholecalciferol (VITAMIN D3) 5000 units TABS, Take 5,000 tablets by mouth daily., Disp: , Rfl:  .  diltiazem (CARDIZEM CD) 240 MG 24 hr capsule, TAKE 1 CAPSULE BY MOUTH  DAILY, Disp: 90 capsule, Rfl: 3 .  fexofenadine (ALLEGRA) 180 MG tablet, TAKE 1 TABLET BY MOUTH DAILY, Disp: 90 tablet, Rfl: 3 .  FLUoxetine (PROZAC) 20 MG capsule, TAKE 3 CAPSULES BY MOUTH  DAILY, Disp: 270 capsule, Rfl: 1 .  HYDROcodone-acetaminophen (NORCO/VICODIN) 5-325 MG tablet, Take one  tablet at night for pain; may take up to every 6 hours as needed for pain if not working or driving, Disp: 30 tablet, Rfl: 0 .  losartan (COZAAR) 25 MG tablet, TAKE 1 TABLET BY MOUTH  EVERY DAY, Disp: 90 tablet, Rfl: 3 .  metaxalone (SKELAXIN) 800 MG tablet, Take 0.5-1 tablets (400-800 mg total) by mouth 3 (three) times daily as needed for muscle spasms., Disp: 90 tablet, Rfl: 1 .  montelukast (SINGULAIR) 10 MG tablet, TAKE 1 TABLET BY MOUTH  DAILY, Disp: 90 tablet, Rfl: 3 .  meloxicam (MOBIC) 15 MG tablet, Take 1 tablet (15 mg total) by mouth daily. (Patient not taking: Reported on 03/25/2018), Disp: 15 tablet, Rfl: 0  Review of Systems  Constitutional: Negative.   Respiratory: Negative.   Cardiovascular: Negative.   Genitourinary: Negative.   Musculoskeletal: Positive for back pain, gait problem and myalgias.  Neurological: Positive for weakness and numbness.    Social History   Tobacco Use  . Smoking status: Never Smoker  . Smokeless tobacco: Never Used  Substance Use Topics  . Alcohol use: Yes   Objective:   BP (!) 160/100 (BP Location: Left Wrist, Patient Position: Sitting, Cuff Size: Normal)   Pulse 94   Temp 98.6 F (37 C) (Oral)   Resp 16   Wt (!) 328  lb 6.4 oz (149 kg)   SpO2 98%   BMI 60.07 kg/m  Vitals:   03/25/18 1107  BP: (!) 160/100  Pulse: 94  Resp: 16  Temp: 98.6 F (37 C)  TempSrc: Oral  SpO2: 98%  Weight: (!) 328 lb 6.4 oz (149 kg)     Physical Exam  Constitutional: She appears well-developed and well-nourished. No distress.  Neck: Normal range of motion. Neck supple.  Cardiovascular: Normal rate and regular rhythm. Exam reveals no gallop and no friction rub.  No murmur heard. Pulmonary/Chest: Effort normal and breath sounds normal. No respiratory distress. She has no wheezes. She has no rales.  Musculoskeletal:       Thoracic back: Normal.       Lumbar back: She exhibits decreased range of motion, tenderness, deformity (significant lordosis) and  spasm. She exhibits no bony tenderness.  Skin: She is not diaphoretic.  Vitals reviewed.       Assessment & Plan:     1. Bilateral sciatica Will get imaging as below. Continue meloxicam and skelaxin daily. Norco for prn severe pain. I will f/u pending imaging. I will see her back in 2 weeks to see if we need to refer.  - meloxicam (MOBIC) 15 MG tablet; Take 1 tablet (15 mg total) by mouth daily.  Dispense: 90 tablet; Refill: 0 - DG Lumbar Spine Complete; Future  2. Essential hypertension Elevated today most likely from pain. Will increase losartan to 50mg  as below. Will check BP in 2 weeks.  - losartan (COZAAR) 25 MG tablet; Take 2 tablets (50 mg total) by mouth daily.  Dispense: 180 tablet; Refill: [place], PA-C  [facility]

## 2018-03-25 NOTE — Telephone Encounter (Signed)
Pt returned call. Please advise. Thanks TNP °

## 2018-03-25 NOTE — Telephone Encounter (Signed)
LMTCB

## 2018-03-25 NOTE — Patient Instructions (Signed)

## 2018-03-25 NOTE — Telephone Encounter (Signed)
-----   Message from Mar Daring, [place] sent at 03/25/2018  4:14 PM EDT ----- There is some arthritic changes noted throughout the lumbar spine, especially L4-L5 and L5-S1. There is also some forward slipping of the L4-L5 vertebrae. This is probably the area causing your pain.

## 2018-03-26 NOTE — Telephone Encounter (Signed)
Patient advised as below. Patient would like a little more information or detail of slipping of the L4-L5. Cb# [id number]

## 2018-03-29 NOTE — Telephone Encounter (Signed)
Called patient to discuss. No answer, left VM to call back.

## 2018-04-08 ENCOUNTER — Telehealth: Payer: Self-pay

## 2018-04-08 NOTE — Telephone Encounter (Signed)
LM that FMLA form is fill out and have been faxed.  Thanks,  -Naomi Castrogiovanni

## 2018-05-28 ENCOUNTER — Other Ambulatory Visit: Payer: Self-pay | Admitting: Physician Assistant

## 2018-05-28 DIAGNOSIS — M5432 Sciatica, left side: Principal | ICD-10-CM

## 2018-05-28 DIAGNOSIS — M5431 Sciatica, right side: Secondary | ICD-10-CM

## 2018-05-28 DIAGNOSIS — M62838 Other muscle spasm: Secondary | ICD-10-CM

## 2018-06-21 ENCOUNTER — Other Ambulatory Visit: Payer: Self-pay | Admitting: Physician Assistant

## 2018-06-21 DIAGNOSIS — F32A Depression, unspecified: Secondary | ICD-10-CM

## 2018-06-21 DIAGNOSIS — F329 Major depressive disorder, single episode, unspecified: Secondary | ICD-10-CM

## 2018-06-21 MED ORDER — FLUOXETINE HCL 20 MG PO CAPS: 60.0000 mg | ORAL_CAPSULE | Freq: Every day | ORAL | 1 refills | Status: DC

## 2018-06-21 NOTE — Progress Notes (Signed)
Refilled fluoxetine.

## 2018-06-30 DIAGNOSIS — J019 Acute sinusitis, unspecified: Secondary | ICD-10-CM | POA: Diagnosis not present

## 2018-06-30 DIAGNOSIS — B9689 Other specified bacterial agents as the cause of diseases classified elsewhere: Secondary | ICD-10-CM | POA: Diagnosis not present

## 2018-07-02 ENCOUNTER — Encounter: Payer: Self-pay | Admitting: Physician Assistant

## 2018-07-02 ENCOUNTER — Ambulatory Visit (INDEPENDENT_AMBULATORY_CARE_PROVIDER_SITE_OTHER): Payer: BLUE CROSS/BLUE SHIELD | Admitting: Physician Assistant

## 2018-07-02 VITALS — BP 180/100 | HR 75 | Temp 98.6°F | Resp 16 | Wt 331.0 lb

## 2018-07-02 DIAGNOSIS — Z1211 Encounter for screening for malignant neoplasm of colon: Secondary | ICD-10-CM | POA: Diagnosis not present

## 2018-07-02 DIAGNOSIS — R7303 Prediabetes: Secondary | ICD-10-CM | POA: Diagnosis not present

## 2018-07-02 DIAGNOSIS — I10 Essential (primary) hypertension: Secondary | ICD-10-CM

## 2018-07-02 DIAGNOSIS — Z1239 Encounter for other screening for malignant neoplasm of breast: Secondary | ICD-10-CM | POA: Diagnosis not present

## 2018-07-02 DIAGNOSIS — Z0000 Encounter for general adult medical examination without abnormal findings: Secondary | ICD-10-CM

## 2018-07-02 DIAGNOSIS — Z23 Encounter for immunization: Secondary | ICD-10-CM | POA: Diagnosis not present

## 2018-07-02 NOTE — Patient Instructions (Signed)

## 2018-07-02 NOTE — Progress Notes (Signed)
Patient: Ashley Mccullough, Female    DOB: Mar 23, 1961, 57 y.o.   MRN: [id number] Visit Date: 07/02/2018  Today's Provider: Mar Daring, PA-C   Chief Complaint  Patient presents with  . Annual Exam   Subjective:    Annual physical exam Ashley Mccullough is a 57 y.o. female who presents today for health maintenance and complete physical. She feels fairly well. She reports exercising none. She reports she is sleeping fairly well. -----------------------------------------------------------------   Review of Systems  Constitutional: Negative.   HENT: Positive for congestion, postnasal drip and sinus pressure.   Eyes: Negative.   Respiratory: Positive for shortness of breath.   Cardiovascular: Positive for leg swelling ("sometimes").  Gastrointestinal: Positive for blood in stool, nausea and vomiting.  Endocrine: Negative.   Genitourinary: Positive for flank pain.  Musculoskeletal: Positive for myalgias.  Skin: Negative.   Allergic/Immunologic: Negative.   Neurological: Negative.   Hematological: Negative.   Psychiatric/Behavioral: Positive for dysphoric mood.    Social History      She  reports that she has never smoked. She has never used smokeless tobacco. She reports that she drinks alcohol. She reports that she does not use drugs.       Social History   Socioeconomic History  . Marital status: Divorced    Spouse name: Not on file  . Number of children: Not on file  . Years of education: Not on file  . Highest education level: Not on file  Occupational History  . Not on file  Social Needs  . Financial resource strain: Not on file  . Food insecurity:    Worry: Not on file    Inability: Not on file  . Transportation needs:    Medical: Not on file    Non-medical: Not on file  Tobacco Use  . Smoking status: Never Smoker  . Smokeless tobacco: Never Used  Substance and Sexual Activity  . Alcohol use: Yes  . Drug use: No  . Sexual activity:  Not on file  Lifestyle  . Physical activity:    Days per week: Not on file    Minutes per session: Not on file  . Stress: Not on file  Relationships  . Social connections:    Talks on phone: Not on file    Gets together: Not on file    Attends religious service: Not on file    Active member of club or organization: Not on file    Attends meetings of clubs or organizations: Not on file    Relationship status: Not on file  Other Topics Concern  . Not on file  Social History Narrative  . Not on file    Past Medical History:  Diagnosis Date  . Allergy   . Anxiety   . Depression   . Hyperlipidemia   . Hypertension      Patient Active Problem List   Diagnosis Date Noted  . BMI 50.0-59.9, adult ([place]) 07/01/2017  . Morbid obesity due to excess calories ([place]) 12/24/2015  . Allergic rhinitis 05/29/2015  . Anxiety, generalized 05/29/2015  . D (diarrhea) 05/29/2015  . Avitaminosis D 05/29/2015  . Borderline diabetes 05/29/2015  . Fatigue 05/29/2015  . Depression 03/15/2015  . Hypertension 03/15/2015  . Cannot sleep 11/13/2009  . Carbuncle and furuncle 03/13/2008  . Congenital pes planus 03/13/2008  . CD (contact dermatitis) 04/29/2007  . Hypercholesterolemia without hypertriglyceridemia 09/16/2006  . Arthropathia 08/11/2006  . Clinical depression 08/11/2006  Past Surgical History:  Procedure Laterality Date  . ABDOMINAL HYSTERECTOMY     still has ovaries    Family History        Family Status  Relation Name Status  . Mother  Alive       pre diabetic  . Father  Deceased at age 68       CAD  . Sister  Alive  . Brother  Alive  . Brother  Alive  . Mat Aunt 2 sisters Alive  . Cousin maternal Alive        Her family history includes Alcohol abuse in her father; Breast cancer in her cousin, maternal aunt, and mother; Cancer in her mother; Depression in her father; Diabetes in her brother and brother; Heart disease in her father; Hypertension in her sister.       Allergies  Allergen Reactions  . Latex Itching  . Simvastatin     Other reaction(s): Unknown Memory changes Memory changes  . Sulfa Antibiotics     Other reaction(s): Unknown  . Levofloxacin Rash  . Prednisone Rash     Current Outpatient Medications:  .  amoxicillin-clavulanate (AUGMENTIN) 875-125 MG tablet, Take by mouth., Disp: , Rfl:  .  benzonatate (TESSALON) 200 MG capsule, , Disp: , Rfl: 0 .  buPROPion (WELLBUTRIN XL) 150 MG 24 hr tablet, TAKE 1 TABLET BY MOUTH  DAILY, Disp: 90 tablet, Rfl: 1 .  Cholecalciferol (VITAMIN D3) 5000 units TABS, Take 5,000 tablets by mouth daily., Disp: , Rfl:  .  diltiazem (CARDIZEM CD) 240 MG 24 hr capsule, TAKE 1 CAPSULE BY MOUTH  DAILY, Disp: 90 capsule, Rfl: 3 .  fexofenadine (ALLEGRA) 180 MG tablet, TAKE 1 TABLET BY MOUTH DAILY, Disp: 90 tablet, Rfl: 3 .  FLUoxetine (PROZAC) 20 MG capsule, Take 3 capsules (60 mg total) by mouth daily., Disp: 270 capsule, Rfl: 1 .  losartan (COZAAR) 25 MG tablet, Take 2 tablets (50 mg total) by mouth daily., Disp: 180 tablet, Rfl: 3 .  meloxicam (MOBIC) 15 MG tablet, Take 1 tablet (15 mg total) by mouth daily., Disp: 90 tablet, Rfl: 0 .  montelukast (SINGULAIR) 10 MG tablet, TAKE 1 TABLET BY MOUTH  DAILY, Disp: 90 tablet, Rfl: 3 .  promethazine-dextromethorphan (PROMETHAZINE-DM) 6.25-15 MG/5ML syrup, Take by mouth., Disp: , Rfl:  .  azelastine (ASTELIN) 0.1 % nasal spray, Place into the nose., Disp: , Rfl:  .  Cetirizine HCl (ZYRTEC ALLERGY) 10 MG CAPS, Take by mouth., Disp: , Rfl:  .  HYDROcodone-acetaminophen (NORCO/VICODIN) 5-325 MG tablet, Take one tablet at night for pain; may take up to every 6 hours as needed for pain if not working or driving (Patient not taking: Reported on 07/02/2018), Disp: 30 tablet, Rfl: 0 .  metaxalone (SKELAXIN) 800 MG tablet, TAKE 0.5-1 TABLETS (400-800 MG TOTAL) BY MOUTH 3 (THREE) TIMES DAILY AS NEEDED FOR MUSCLE SPASMS. (Patient not taking: Reported on 07/02/2018), Disp: 90  tablet, Rfl: 1   Patient Care Team: Mar Daring, PA-C as PCP - General (Family Medicine)      Objective:   Vitals: BP (!) 180/100 (BP Location: Left Wrist, Patient Position: Sitting, Cuff Size: Normal)   Pulse 75   Temp 98.6 F (37 C) (Oral)   Resp 16   Wt (!) 331 lb (150.1 kg)   SpO2 98%   BMI 60.54 kg/m    Vitals:   07/02/18 1023  BP: (!) 180/100  Pulse: 75  Resp: 16  Temp: 98.6 F (37 C)  TempSrc: Oral  SpO2: 98%  Weight: (!) 331 lb (150.1 kg)     Physical Exam  Constitutional: She is oriented to person, place, and time. She appears well-developed and well-nourished. No distress.  Morbidly obese  HENT:  Head: Normocephalic and atraumatic.  Right Ear: Hearing, tympanic membrane, external ear and ear canal normal.  Left Ear: Hearing, tympanic membrane, external ear and ear canal normal.  Nose: Nose normal.  Mouth/Throat: Uvula is midline, oropharynx is clear and moist and mucous membranes are normal. No oropharyngeal exudate.  Eyes: Pupils are equal, round, and reactive to light. Conjunctivae and EOM are normal. Right eye exhibits no discharge. Left eye exhibits no discharge. No scleral icterus.  Neck: Normal range of motion. Neck supple. No JVD present. Carotid bruit is not present. No tracheal deviation present. No thyromegaly present.  Cardiovascular: Normal rate, regular rhythm, normal heart sounds and intact distal pulses. Exam reveals no gallop and no friction rub.  No murmur heard. Pulmonary/Chest: Effort normal and breath sounds normal. No respiratory distress. She has no wheezes. She has no rales. She exhibits no tenderness. Right breast exhibits no inverted nipple, no mass, no nipple discharge, no skin change and no tenderness. Left breast exhibits no inverted nipple, no mass, no nipple discharge, no skin change and no tenderness. No breast swelling, tenderness, discharge or bleeding. Breasts are symmetrical.  Abdominal: Soft. Bowel sounds are normal.  She exhibits no distension and no mass. There is no tenderness. There is no rebound and no guarding.  Musculoskeletal: Normal range of motion. She exhibits no edema or tenderness.  Lymphadenopathy:    She has no cervical adenopathy.  Neurological: She is alert and oriented to person, place, and time.  Skin: Skin is warm and dry. No rash noted. She is not diaphoretic.  Psychiatric: She has a normal mood and affect. Her behavior is normal. Judgment and thought content normal.  Vitals reviewed.    Depression Screen PHQ 2/9 Scores 07/02/2018 07/01/2017  PHQ - 2 Score 2 2  PHQ- 9 Score 8 12      Assessment & Plan:     Routine Health Maintenance and Physical Exam  Exercise Activities and Dietary recommendations Goals   None     Immunization History  Administered Date(s) Administered  . Influenza Split 08/11/2006  . Influenza,inj,Quad PF,6+ Mos 07/01/2017  . Tdap 12/26/2009    Health Maintenance  Topic Date Due  . COLONOSCOPY  04/21/2011  . MAMMOGRAM  01/03/2018  . INFLUENZA VACCINE  04/15/2018  . TETANUS/TDAP  12/27/2019  . Hepatitis C Screening  Completed  . PAP SMEAR  Discontinued  . HIV Screening  Discontinued     Discussed health benefits of physical activity, and encouraged her to engage in regular exercise appropriate for her age and condition.    1. Annual physical exam Normal physical exam today. Will check labs as below and f/u pending lab results. If labs are stable and WNL she will not need to have these rechecked for one year at her next annual physical exam. She is to call the office in the meantime if she has any acute issue, questions or concerns. - CBC with Differential/Platelet - Comprehensive metabolic panel - Hemoglobin A1c - Lipid panel - TSH  2. Breast cancer screening Breast exam today was normal. There is no family history of breast cancer. She does perform regular self breast exams. Mammogram was ordered as below. Information for [facility]  Breast clinic was given to patient so she may schedule her  mammogram at her convenience. - MM 3D SCREEN BREAST BILATERAL; Future  3. Colon cancer screening Reordered cologuard for patient.  - Cologuard  4. Borderline diabetes Will check labs as below and f/u pending results. - CBC with Differential/Platelet - Comprehensive metabolic panel - Hemoglobin A1c - Lipid panel - TSH  5. Essential hypertension Stable on Diltiazem 240mg  XR and losartan 50mg . Will check labs as below and f/u pending results. - CBC with Differential/Platelet - Comprehensive metabolic panel - Hemoglobin A1c - Lipid panel - TSH  6. Need for influenza vaccination Flu vaccine given today without complication. Patient sat upright for 15 minutes to check for adverse reaction before being released. - Flu Vaccine QUAD 36+ mos IM  --------------------------------------------------------------------    Mar Daring, PA-C  [facility]

## 2018-07-03 LAB — COMPREHENSIVE METABOLIC PANEL
A/G RATIO: 1.2 (ref 1.2–2.2)
ALT: 15 IU/L (ref 0–32)
AST: 15 IU/L (ref 0–40)
Albumin: 4.1 g/dL (ref 3.5–5.5)
Alkaline Phosphatase: 117 IU/L (ref 39–117)
BUN / CREAT RATIO: 19 (ref 9–23)
BUN: 26 mg/dL — AB (ref 6–24)
CALCIUM: 9.5 mg/dL (ref 8.7–10.2)
CO2: 21 mmol/L (ref 20–29)
Chloride: 106 mmol/L (ref 96–106)
Creatinine, Ser: 1.36 mg/dL — ABNORMAL HIGH (ref 0.57–1.00)
GFR calc non Af Amer: 43 mL/min/{1.73_m2} — ABNORMAL LOW (ref >59)
GFR, EST AFRICAN AMERICAN: 50 mL/min/{1.73_m2} — AB (ref >59)
GLOBULIN, TOTAL: 3.4 g/dL (ref 1.5–4.5)
Glucose: 104 mg/dL — ABNORMAL HIGH (ref 65–99)
Potassium: 5 mmol/L (ref 3.5–5.2)
Sodium: 140 mmol/L (ref 134–144)
TOTAL PROTEIN: 7.5 g/dL (ref 6.0–8.5)

## 2018-07-03 LAB — TSH: TSH: 2.18 u[IU]/mL (ref 0.450–4.500)

## 2018-07-03 LAB — LIPID PANEL
CHOL/HDL RATIO: 2.8 ratio (ref 0.0–4.4)
CHOLESTEROL TOTAL: 251 mg/dL — AB (ref 100–199)
HDL: 89 mg/dL (ref >39)
LDL CALC: 148 mg/dL — AB (ref 0–99)
TRIGLYCERIDES: 71 mg/dL (ref 0–149)
VLDL CHOLESTEROL CAL: 14 mg/dL (ref 5–40)

## 2018-07-03 LAB — CBC WITH DIFFERENTIAL/PLATELET
BASOS: 0 %
Basophils Absolute: 0 10*3/uL (ref 0.0–0.2)
EOS (ABSOLUTE): 1.3 10*3/uL — ABNORMAL HIGH (ref 0.0–0.4)
EOS: 13 %
HEMATOCRIT: 38.6 % (ref 34.0–46.6)
Hemoglobin: 12.7 g/dL (ref 11.1–15.9)
IMMATURE GRANULOCYTES: 0 %
Immature Grans (Abs): 0 10*3/uL (ref 0.0–0.1)
Lymphocytes Absolute: 1.4 10*3/uL (ref 0.7–3.1)
Lymphs: 14 %
MCH: 27.7 pg (ref 26.6–33.0)
MCHC: 32.9 g/dL (ref 31.5–35.7)
MCV: 84 fL (ref 79–97)
MONOS ABS: 0.7 10*3/uL (ref 0.1–0.9)
Monocytes: 7 %
NEUTROS PCT: 66 %
Neutrophils Absolute: 6.6 10*3/uL (ref 1.4–7.0)
Platelets: 341 10*3/uL (ref 150–450)
RBC: 4.59 x10E6/uL (ref 3.77–5.28)
RDW: 14 % (ref 12.3–15.4)
WBC: 10.1 10*3/uL (ref 3.4–10.8)

## 2018-07-03 LAB — HEMOGLOBIN A1C
Est. average glucose Bld gHb Est-mCnc: 126 mg/dL
Hgb A1c MFr Bld: 6 % — ABNORMAL HIGH (ref 4.8–5.6)

## 2018-07-05 ENCOUNTER — Telehealth: Payer: Self-pay

## 2018-07-05 ENCOUNTER — Other Ambulatory Visit: Payer: Self-pay

## 2018-07-05 DIAGNOSIS — F32A Depression, unspecified: Secondary | ICD-10-CM

## 2018-07-05 DIAGNOSIS — F329 Major depressive disorder, single episode, unspecified: Secondary | ICD-10-CM

## 2018-07-05 MED ORDER — FLUOXETINE HCL 20 MG PO CAPS: 60.0000 mg | ORAL_CAPSULE | Freq: Every day | ORAL | 1 refills | Status: DC

## 2018-07-05 NOTE — Telephone Encounter (Signed)
LMTCB or to view the message through her mychart.  Thanks,  -Xavyer Steenson

## 2018-07-05 NOTE — Telephone Encounter (Signed)
-----   Message from Mar Daring, [place] sent at 07/05/2018  1:02 PM EDT ----- Blood count is normal. Kidney function increased slightly. Suspect from dehydration for fasting labs. Would recommend to push fluids and recheck in 2-3 months. Can call for lab slip only. A1c is stable at 6.0. Cholesterol increased slightly from last check. Continue working on healthy lifestyle modifications with diet and exercise. Thyroid is normal.

## 2018-07-05 NOTE — Telephone Encounter (Signed)
refilled 

## 2018-07-06 NOTE — Telephone Encounter (Signed)
Viewed by Sharen Heck on 07/05/2018 10:14 PM

## 2018-07-22 ENCOUNTER — Other Ambulatory Visit: Payer: Self-pay

## 2018-07-22 DIAGNOSIS — F329 Major depressive disorder, single episode, unspecified: Secondary | ICD-10-CM

## 2018-07-22 DIAGNOSIS — I10 Essential (primary) hypertension: Secondary | ICD-10-CM

## 2018-07-22 DIAGNOSIS — F32A Depression, unspecified: Secondary | ICD-10-CM

## 2018-07-22 MED ORDER — FLUOXETINE HCL 20 MG PO CAPS: 60.0000 mg | ORAL_CAPSULE | Freq: Every day | ORAL | 1 refills | Status: DC

## 2018-07-22 MED ORDER — LOSARTAN POTASSIUM 25 MG PO TABS: 50.0000 mg | ORAL_TABLET | Freq: Every day | ORAL | 3 refills | Status: DC

## 2018-07-22 NOTE — Telephone Encounter (Signed)
Patient requesting new 90 day supply to go to [facility]

## 2018-07-26 ENCOUNTER — Other Ambulatory Visit: Payer: Self-pay | Admitting: Physician Assistant

## 2018-07-26 DIAGNOSIS — I10 Essential (primary) hypertension: Secondary | ICD-10-CM

## 2018-07-26 DIAGNOSIS — F329 Major depressive disorder, single episode, unspecified: Secondary | ICD-10-CM

## 2018-07-26 DIAGNOSIS — F32A Depression, unspecified: Secondary | ICD-10-CM

## 2018-07-26 MED ORDER — LOSARTAN POTASSIUM 25 MG PO TABS: 50.0000 mg | ORAL_TABLET | Freq: Every day | ORAL | 3 refills | Status: DC

## 2018-07-26 MED ORDER — FLUOXETINE HCL 20 MG PO CAPS: 60.0000 mg | ORAL_CAPSULE | Freq: Every day | ORAL | 1 refills | Status: DC

## 2018-07-26 NOTE — Telephone Encounter (Signed)
Pt still waiting on:  losartan (COZAAR) 25 MG tablet - 180 tabs - pt has been out for a couple of days. FLUoxetine (PROZAC) 20 MG capsule  Please fill at: [id number] [address], [place] [address] [place] [phone] (Phone) [phone] (Fax)    Thanks, [place]

## 2018-08-31 ENCOUNTER — Other Ambulatory Visit: Payer: Self-pay | Admitting: Physician Assistant

## 2018-08-31 DIAGNOSIS — F411 Generalized anxiety disorder: Secondary | ICD-10-CM

## 2018-09-02 ENCOUNTER — Other Ambulatory Visit: Payer: Self-pay | Admitting: Physician Assistant

## 2018-09-02 DIAGNOSIS — F32A Depression, unspecified: Secondary | ICD-10-CM

## 2018-09-02 DIAGNOSIS — F329 Major depressive disorder, single episode, unspecified: Secondary | ICD-10-CM

## 2018-09-03 ENCOUNTER — Other Ambulatory Visit: Payer: Self-pay

## 2018-09-03 DIAGNOSIS — M5431 Sciatica, right side: Secondary | ICD-10-CM

## 2018-09-03 DIAGNOSIS — M5432 Sciatica, left side: Principal | ICD-10-CM

## 2018-09-03 MED ORDER — MELOXICAM 15 MG PO TABS: 15.0000 mg | ORAL_TABLET | Freq: Every day | ORAL | 1 refills | Status: DC

## 2018-10-20 ENCOUNTER — Ambulatory Visit: Payer: Managed Care, Other (non HMO) | Admitting: Physician Assistant

## 2018-10-20 ENCOUNTER — Encounter: Payer: Self-pay | Admitting: Physician Assistant

## 2018-10-20 VITALS — BP 169/91 | HR 84 | Temp 98.5°F | Resp 16 | Wt 339.0 lb

## 2018-10-20 DIAGNOSIS — M5431 Sciatica, right side: Secondary | ICD-10-CM | POA: Diagnosis not present

## 2018-10-20 DIAGNOSIS — F330 Major depressive disorder, recurrent, mild: Secondary | ICD-10-CM

## 2018-10-20 DIAGNOSIS — Z6841 Body Mass Index (BMI) 40.0 and over, adult: Secondary | ICD-10-CM

## 2018-10-20 DIAGNOSIS — M5432 Sciatica, left side: Secondary | ICD-10-CM

## 2018-10-20 NOTE — Progress Notes (Signed)
Patient: Ashley Mccullough Female    DOB: 1961-03-16   58 y.o.   MRN: [id number] Visit Date: 10/20/2018  Today's Provider: Mar Daring, PA-C   Chief Complaint  Patient presents with  . FMLA forms   Subjective:     HPI Patient here today to renew her FMLA forms for her Bilateral Sciatica. She reports she has been doing better since she was able to get a chair where she can change her positions throughout the day.   Allergies  Allergen Reactions  . Latex Itching  . Simvastatin     Other reaction(s): Unknown Memory changes Memory changes  . Sulfa Antibiotics     Other reaction(s): Unknown  . Levofloxacin Rash  . Prednisone Rash     Current Outpatient Medications:  .  azelastine (ASTELIN) 0.1 % nasal spray, Place into the nose., Disp: , Rfl:  .  buPROPion (WELLBUTRIN XL) 150 MG 24 hr tablet, TAKE 1 TABLET BY MOUTH EVERY DAY, Disp: 90 tablet, Rfl: 1 .  Cetirizine HCl (ZYRTEC ALLERGY) 10 MG CAPS, Take by mouth., Disp: , Rfl:  .  diltiazem (CARDIZEM CD) 240 MG 24 hr capsule, TAKE 1 CAPSULE BY MOUTH  DAILY, Disp: 90 capsule, Rfl: 3 .  FLUoxetine (PROZAC) 20 MG capsule, TAKE 3 CAPSULES BY MOUTH EVERY DAY, Disp: 270 capsule, Rfl: 1 .  losartan (COZAAR) 25 MG tablet, Take 2 tablets (50 mg total) by mouth daily., Disp: 180 tablet, Rfl: 3 .  meloxicam (MOBIC) 15 MG tablet, Take 1 tablet (15 mg total) by mouth daily., Disp: 90 tablet, Rfl: 1 .  metaxalone (SKELAXIN) 800 MG tablet, TAKE 0.5-1 TABLETS (400-800 MG TOTAL) BY MOUTH 3 (THREE) TIMES DAILY AS NEEDED FOR MUSCLE SPASMS., Disp: 90 tablet, Rfl: 1 .  montelukast (SINGULAIR) 10 MG tablet, TAKE 1 TABLET BY MOUTH  DAILY, Disp: 90 tablet, Rfl: 3 .  Cholecalciferol (VITAMIN D3) 5000 units TABS, Take 5,000 tablets by mouth daily., Disp: , Rfl:  .  HYDROcodone-acetaminophen (NORCO/VICODIN) 5-325 MG tablet, Take one tablet at night for pain; may take up to every 6 hours as needed for pain if not working or driving (Patient not  taking: Reported on 07/02/2018), Disp: 30 tablet, Rfl: 0  Review of Systems  Constitutional: Negative.   Respiratory: Negative.   Cardiovascular: Negative.   Gastrointestinal: Negative.   Musculoskeletal: Positive for back pain.  Neurological: Negative.     Social History   Tobacco Use  . Smoking status: Never Smoker  . Smokeless tobacco: Never Used  Substance Use Topics  . Alcohol use: Yes      Objective:   BP (!) 169/91 (BP Location: Left Wrist, Patient Position: Sitting, Cuff Size: Normal)   Pulse 84   Temp 98.5 F (36.9 C) (Oral)   Resp 16   Wt (!) 339 lb (153.8 kg)   BMI 62.00 kg/m  Vitals:   10/20/18 0951  BP: (!) 169/91  Pulse: 84  Resp: 16  Temp: 98.5 F (36.9 C)  TempSrc: Oral  Weight: (!) 339 lb (153.8 kg)     Physical Exam Vitals signs reviewed.  Constitutional:      General: She is not in acute distress.    Appearance: She is well-developed. She is not diaphoretic.  Neck:     Musculoskeletal: Normal range of motion and neck supple.  Cardiovascular:     Rate and Rhythm: Normal rate and regular rhythm.     Heart sounds: Normal heart sounds. No murmur.  No friction rub. No gallop.   Pulmonary:     Effort: Pulmonary effort is normal. No respiratory distress.     Breath sounds: Normal breath sounds. No wheezing or rales.        Assessment & Plan    1. Bilateral sciatica Currently stable. Using meloxicam 15mg  prn.   2. Morbid obesity due to excess calories ([place]) Counseled patient on healthy lifestyle modifications including dieting and exercise.   3. BMI 50.0-59.9, adult [facility]) See above medical treatment plan.  4. Mild episode of recurrent major depressive disorder (HCC) Stable on wellbutrin 150mg  xr and fluoxetine 20mg  daily.      Mar Daring, PA-C  [facility]

## 2018-10-26 ENCOUNTER — Encounter: Payer: Self-pay | Admitting: Physician Assistant

## 2018-10-26 DIAGNOSIS — F330 Major depressive disorder, recurrent, mild: Secondary | ICD-10-CM | POA: Insufficient documentation

## 2018-12-04 ENCOUNTER — Other Ambulatory Visit: Payer: Self-pay | Admitting: Physician Assistant

## 2018-12-04 DIAGNOSIS — I10 Essential (primary) hypertension: Secondary | ICD-10-CM

## 2019-01-04 ENCOUNTER — Other Ambulatory Visit: Payer: Self-pay | Admitting: Physician Assistant

## 2019-01-04 DIAGNOSIS — I10 Essential (primary) hypertension: Secondary | ICD-10-CM

## 2019-01-18 ENCOUNTER — Other Ambulatory Visit: Payer: Self-pay

## 2019-01-18 ENCOUNTER — Encounter: Payer: Self-pay | Admitting: *Deleted

## 2019-01-18 DIAGNOSIS — Z811 Family history of alcohol abuse and dependence: Secondary | ICD-10-CM

## 2019-01-18 DIAGNOSIS — Z6841 Body Mass Index (BMI) 40.0 and over, adult: Secondary | ICD-10-CM

## 2019-01-18 DIAGNOSIS — K810 Acute cholecystitis: Secondary | ICD-10-CM | POA: Diagnosis not present

## 2019-01-18 DIAGNOSIS — Z888 Allergy status to other drugs, medicaments and biological substances status: Secondary | ICD-10-CM

## 2019-01-18 DIAGNOSIS — R109 Unspecified abdominal pain: Secondary | ICD-10-CM | POA: Diagnosis not present

## 2019-01-18 DIAGNOSIS — Z8249 Family history of ischemic heart disease and other diseases of the circulatory system: Secondary | ICD-10-CM

## 2019-01-18 DIAGNOSIS — Z833 Family history of diabetes mellitus: Secondary | ICD-10-CM

## 2019-01-18 DIAGNOSIS — Z791 Long term (current) use of non-steroidal anti-inflammatories (NSAID): Secondary | ICD-10-CM

## 2019-01-18 DIAGNOSIS — Z79899 Other long term (current) drug therapy: Secondary | ICD-10-CM

## 2019-01-18 DIAGNOSIS — E785 Hyperlipidemia, unspecified: Secondary | ICD-10-CM | POA: Diagnosis present

## 2019-01-18 DIAGNOSIS — Z803 Family history of malignant neoplasm of breast: Secondary | ICD-10-CM

## 2019-01-18 DIAGNOSIS — F329 Major depressive disorder, single episode, unspecified: Secondary | ICD-10-CM | POA: Diagnosis present

## 2019-01-18 DIAGNOSIS — Z818 Family history of other mental and behavioral disorders: Secondary | ICD-10-CM

## 2019-01-18 DIAGNOSIS — Z90710 Acquired absence of both cervix and uterus: Secondary | ICD-10-CM

## 2019-01-18 DIAGNOSIS — Z91040 Latex allergy status: Secondary | ICD-10-CM

## 2019-01-18 DIAGNOSIS — Z882 Allergy status to sulfonamides status: Secondary | ICD-10-CM

## 2019-01-18 DIAGNOSIS — I10 Essential (primary) hypertension: Secondary | ICD-10-CM | POA: Diagnosis present

## 2019-01-18 DIAGNOSIS — Z881 Allergy status to other antibiotic agents status: Secondary | ICD-10-CM

## 2019-01-18 MED ORDER — SODIUM CHLORIDE 0.9% FLUSH: 3.0000 mL | Freq: Once | INTRAVENOUS | Status: DC

## 2019-01-18 NOTE — ED Triage Notes (Signed)
Pt brought in via ems from home with upper abd pain and vomiting.  Sx began today.  Diarrhea x 2 today. Pt also reports back pain.  Pt alert speech clear.

## 2019-01-19 ENCOUNTER — Emergency Department: Payer: Managed Care, Other (non HMO)

## 2019-01-19 ENCOUNTER — Inpatient Hospital Stay
Admission: EM | Admit: 2019-01-19 | Discharge: 2019-01-23 | DRG: 445 | Disposition: A | Discharge status: Home / Self Care | Payer: Managed Care, Other (non HMO) | Attending: Surgery | Admitting: Surgery

## 2019-01-19 DIAGNOSIS — R1011 Right upper quadrant pain: Secondary | ICD-10-CM

## 2019-01-19 DIAGNOSIS — K810 Acute cholecystitis: Principal | ICD-10-CM | POA: Diagnosis present

## 2019-01-19 LAB — URINALYSIS, COMPLETE (UACMP) WITH MICROSCOPIC
Bacteria, UA: NONE SEEN
Bilirubin Urine: NEGATIVE
Glucose, UA: 50 mg/dL — AB
Hgb urine dipstick: NEGATIVE
Ketones, ur: NEGATIVE mg/dL
Leukocytes,Ua: NEGATIVE
Nitrite: NEGATIVE
Protein, ur: 30 mg/dL — AB
Specific Gravity, Urine: 1.015 (ref 1.005–1.030)
pH: 8 (ref 5.0–8.0)

## 2019-01-19 LAB — COMPREHENSIVE METABOLIC PANEL
ALT: 68 U/L — ABNORMAL HIGH (ref 0–44)
AST: 204 U/L — ABNORMAL HIGH (ref 15–41)
Albumin: 3.7 g/dL (ref 3.5–5.0)
Alkaline Phosphatase: 150 U/L — ABNORMAL HIGH (ref 38–126)
Anion gap: 11 (ref 5–15)
BUN: 27 mg/dL — ABNORMAL HIGH (ref 6–20)
CO2: 24 mmol/L (ref 22–32)
Calcium: 9.2 mg/dL (ref 8.9–10.3)
Chloride: 105 mmol/L (ref 98–111)
Creatinine, Ser: 1.59 mg/dL — ABNORMAL HIGH (ref 0.44–1.00)
GFR calc Af Amer: 41 mL/min — ABNORMAL LOW (ref >60)
GFR calc non Af Amer: 36 mL/min — ABNORMAL LOW (ref >60)
Glucose, Bld: 176 mg/dL — ABNORMAL HIGH (ref 70–99)
Potassium: 4.3 mmol/L (ref 3.5–5.1)
Sodium: 140 mmol/L (ref 135–145)
Total Bilirubin: 0.9 mg/dL (ref 0.3–1.2)
Total Protein: 8.2 g/dL — ABNORMAL HIGH (ref 6.5–8.1)

## 2019-01-19 LAB — CBC
HCT: 41.8 % (ref 36.0–46.0)
Hemoglobin: 13.1 g/dL (ref 12.0–15.0)
MCH: 27.4 pg (ref 26.0–34.0)
MCHC: 31.3 g/dL (ref 30.0–36.0)
MCV: 87.4 fL (ref 80.0–100.0)
Platelets: 292 10*3/uL (ref 150–400)
RBC: 4.78 MIL/uL (ref 3.87–5.11)
RDW: 14.2 % (ref 11.5–15.5)
WBC: 12.1 10*3/uL — ABNORMAL HIGH (ref 4.0–10.5)
nRBC: 0 % (ref 0.0–0.2)

## 2019-01-19 LAB — LIPASE, BLOOD: Lipase: 29 U/L (ref 11–51)

## 2019-01-19 MED ORDER — HYDROMORPHONE HCL 1 MG/ML IJ SOLN: 0.5000 mg | INTRAMUSCULAR | Status: DC | PRN

## 2019-01-19 MED ORDER — KETOROLAC TROMETHAMINE 30 MG/ML IJ SOLN
15.0000 mg | Freq: Four times a day (QID) | INTRAMUSCULAR | Status: DC
  Administered 2019-01-19 – 2019-01-23 (×11): 15 mg via INTRAVENOUS
  Filled 2019-01-19 (×12): qty 1

## 2019-01-19 MED ORDER — ONDANSETRON HCL 4 MG/2ML IJ SOLN
4.0000 mg | Freq: Once | INTRAMUSCULAR | Status: AC
  Administered 2019-01-19: 4 mg via INTRAVENOUS
  Filled 2019-01-19: qty 2

## 2019-01-19 MED ORDER — ORAL CARE MOUTH RINSE
15.0000 mL | Freq: Two times a day (BID) | OROMUCOSAL | Status: DC
  Administered 2019-01-19 – 2019-01-22 (×6): 15 mL via OROMUCOSAL

## 2019-01-19 MED ORDER — DILTIAZEM HCL ER COATED BEADS 120 MG PO CP24
240.0000 mg | ORAL_CAPSULE | Freq: Every day | ORAL | Status: DC
  Administered 2019-01-19 – 2019-01-22 (×4): 240 mg via ORAL
  Filled 2019-01-19: qty 1
  Filled 2019-01-19 (×5): qty 2

## 2019-01-19 MED ORDER — LACTATED RINGERS IV SOLN
INTRAVENOUS | Status: DC
  Administered 2019-01-19 – 2019-01-21 (×5): via INTRAVENOUS

## 2019-01-19 MED ORDER — SODIUM CHLORIDE 0.9 % IV SOLN
2.0000 g | INTRAVENOUS | Status: DC
  Administered 2019-01-19 – 2019-01-23 (×5): 2 g via INTRAVENOUS
  Filled 2019-01-19: qty 20
  Filled 2019-01-19: qty 2
  Filled 2019-01-19 (×2): qty 20
  Filled 2019-01-19: qty 2

## 2019-01-19 MED ORDER — ACETAMINOPHEN 325 MG PO TABS: 650.0000 mg | ORAL_TABLET | Freq: Four times a day (QID) | ORAL | Status: DC | PRN

## 2019-01-19 MED ORDER — ENOXAPARIN SODIUM 40 MG/0.4ML ~~LOC~~ SOLN
40.0000 mg | Freq: Two times a day (BID) | SUBCUTANEOUS | Status: DC
  Administered 2019-01-20 – 2019-01-23 (×4): 40 mg via SUBCUTANEOUS
  Filled 2019-01-19 (×6): qty 0.4

## 2019-01-19 MED ORDER — ONDANSETRON 4 MG PO TBDP: 4.0000 mg | ORAL_TABLET | Freq: Four times a day (QID) | ORAL | Status: DC | PRN

## 2019-01-19 MED ORDER — SODIUM CHLORIDE 0.9 % IV BOLUS
1000.0000 mL | Freq: Once | INTRAVENOUS | Status: AC
  Administered 2019-01-19: 1000 mL via INTRAVENOUS

## 2019-01-19 MED ORDER — ONDANSETRON HCL 4 MG/2ML IJ SOLN: 4.0000 mg | Freq: Four times a day (QID) | INTRAMUSCULAR | Status: DC | PRN

## 2019-01-19 MED ORDER — MORPHINE SULFATE (PF) 4 MG/ML IV SOLN
4.0000 mg | Freq: Once | INTRAVENOUS | Status: AC
  Administered 2019-01-19: 4 mg via INTRAVENOUS
  Filled 2019-01-19: qty 1

## 2019-01-19 MED ORDER — FLUOXETINE HCL 20 MG PO CAPS
60.0000 mg | ORAL_CAPSULE | Freq: Every day | ORAL | Status: DC
  Administered 2019-01-19 – 2019-01-22 (×4): 60 mg via ORAL
  Filled 2019-01-19 (×5): qty 3

## 2019-01-19 MED ORDER — ACETAMINOPHEN 650 MG RE SUPP: 650.0000 mg | Freq: Four times a day (QID) | RECTAL | Status: DC | PRN

## 2019-01-19 MED ORDER — MONTELUKAST SODIUM 10 MG PO TABS
10.0000 mg | ORAL_TABLET | Freq: Every day | ORAL | Status: DC
  Administered 2019-01-19 – 2019-01-22 (×4): 10 mg via ORAL
  Filled 2019-01-19 (×5): qty 1

## 2019-01-19 MED ORDER — AZELASTINE HCL 0.1 % NA SOLN
1.0000 | Freq: Two times a day (BID) | NASAL | Status: DC
  Administered 2019-01-20: 09:00:00 1 via NASAL
  Filled 2019-01-19: qty 30

## 2019-01-19 MED ORDER — BUPROPION HCL ER (XL) 150 MG PO TB24
150.0000 mg | ORAL_TABLET | Freq: Every day | ORAL | Status: DC
  Administered 2019-01-19 – 2019-01-22 (×4): 150 mg via ORAL
  Filled 2019-01-19 (×4): qty 1

## 2019-01-19 NOTE — ED Notes (Signed)
Dr. Celine Ahr, surgeon at bedside.

## 2019-01-19 NOTE — ED Notes (Signed)
IV team to bedside. Notified pt left for [place] and will likely be back within 10 mins.

## 2019-01-19 NOTE — ED Notes (Signed)
Pt not yet back from [place]. IV team remains at bedside waiting.

## 2019-01-19 NOTE — H&P (Signed)
Ashley Mccullough is an 58 y.o. female.   Chief Complaint: Abdominal pain, possible acute cholecystitis. HPI: The patient is a 58 year old super morbidly obese woman who presented to the emergency department after experiencing significant epigastric pain at about 9 PM the night prior to her presentation.  She states that she had a stabbing sensation at the mid epigastrium that radiated through to her back.  She reports that she had eaten leftover fried chicken and she thought initially, that she had food poisoning.  She took baking soda water, thinking this might alleviate her discomfort.  It did not she had nausea and vomiting.  The vomiting was nonbloody and nonbilious.  When her pain did not abate, she presented to the emergency department.  She was found to be afebrile with stable vital signs, but she did have an elevated white blood cell count and right upper quadrant ultrasound was concerning for acute cholecystitis.  At the time of my exam, she had received pain medication and was quite comfortable.  Past Medical History:  Diagnosis Date  . Allergy   . Anxiety   . Depression   . Hyperlipidemia   . Hypertension     Past Surgical History:  Procedure Laterality Date  . ABDOMINAL HYSTERECTOMY     still has ovaries    Family History  Problem Relation Age of Onset  . Cancer Mother        breast  . Breast cancer Mother   . Depression Father   . Heart disease Father   . Alcohol abuse Father   . Hypertension Sister   . Diabetes Brother   . Diabetes Brother   . Breast cancer Maternal Aunt   . Breast cancer Cousin    Social History:  reports that she has never smoked. She has never used smokeless tobacco. She reports previous alcohol use. She reports that she does not use drugs.  Allergies:  Allergies  Allergen Reactions  . Latex Itching  . Simvastatin     Other reaction(s): Unknown Memory changes Memory changes  . Sulfa Antibiotics     Other reaction(s): Unknown  .  Levofloxacin Rash  . Prednisone Rash    (Not in a hospital admission)   Results for orders placed or performed during the hospital encounter of 01/19/19 (from the past 48 hour(s))  Lipase, blood     Status: None   Collection Time: 01/18/19 11:54 PM  Result Value Ref Range   Lipase 29 11 - 51 U/L    Comment: Performed at [facility], [address] [place], [place] [address]  Comprehensive metabolic panel     Status: Abnormal   Collection Time: 01/18/19 11:54 PM  Result Value Ref Range   Sodium 140 135 - 145 mmol/L   Potassium 4.3 3.5 - 5.1 mmol/L   Chloride 105 98 - 111 mmol/L   CO2 24 22 - 32 mmol/L   Glucose, Bld 176 (H) 70 - 99 mg/dL   BUN 27 (H) 6 - 20 mg/dL   Creatinine, Ser 1.59 (H) 0.44 - 1.00 mg/dL   Calcium 9.2 8.9 - 10.3 mg/dL   Total Protein 8.2 (H) 6.5 - 8.1 g/dL   Albumin 3.7 3.5 - 5.0 g/dL   AST 204 (H) 15 - 41 U/L   ALT 68 (H) 0 - 44 U/L   Alkaline Phosphatase 150 (H) 38 - 126 U/L   Total Bilirubin 0.9 0.3 - 1.2 mg/dL   GFR calc non Af Amer 36 (L) >60 mL/min  GFR calc Af Amer 41 (L) >60 mL/min   Anion gap 11 5 - 15    Comment: Performed at [facility], [address] [place], [place] [address]  CBC     Status: Abnormal   Collection Time: 01/18/19 11:54 PM  Result Value Ref Range   WBC 12.1 (H) 4.0 - 10.5 K/uL   RBC 4.78 3.87 - 5.11 MIL/uL   Hemoglobin 13.1 12.0 - 15.0 g/dL   HCT 41.8 36.0 - 46.0 %   MCV 87.4 80.0 - 100.0 fL   MCH 27.4 26.0 - 34.0 pg   MCHC 31.3 30.0 - 36.0 g/dL   RDW 14.2 11.5 - 15.5 %   Platelets 292 150 - 400 K/uL   nRBC 0.0 0.0 - 0.2 %    Comment: Performed at [facility], [address] [place], [place] [address]  Urinalysis, Complete w Microscopic     Status: Abnormal   Collection Time: 01/19/19  3:50 AM  Result Value Ref Range   Color, Urine YELLOW (A) YELLOW   APPearance CLEAR (A) CLEAR   Specific Gravity, Urine 1.015 1.005 - 1.030   pH 8.0 5.0 - 8.0   Glucose, UA 50 (A) NEGATIVE mg/dL    Hgb urine dipstick NEGATIVE NEGATIVE   Bilirubin Urine NEGATIVE NEGATIVE   Ketones, ur NEGATIVE NEGATIVE mg/dL   Protein, ur 30 (A) NEGATIVE mg/dL   Nitrite NEGATIVE NEGATIVE   Leukocytes,Ua NEGATIVE NEGATIVE    Comment: Performed at [facility], [address] [place], [place] [address]   US Abdomen Limited Ruq  Result Date: 01/19/2019 CLINICAL DATA:  Right upper quadrant pain, vomiting EXAM: ULTRASOUND ABDOMEN LIMITED RIGHT UPPER QUADRANT COMPARISON:  None. FINDINGS: Gallbladder: Gallbladder wall is mildly thickened at 4 mm. Small stones and sludge within the gallbladder. Ring down artifact noted from the anterior wall compatible with adenomyomatosis. Patient was tender over the gallbladder during the study. Common bile duct: Diameter: Borderline dilated, 7 mm. The distal duct cannot be visualized due to overlying bowel gas. Liver: Increased echotexture compatible with fatty infiltration. No focal abnormality or biliary ductal dilatation. Portal vein is patent on color Doppler imaging with normal direction of blood flow towards the liver. IMPRESSION: Cholelithiasis and sludge within the gallbladder. Ring down artifact from the anterior gallbladder wall compatible with adenomyomatosis. Mild gallbladder wall thickening and tenderness over the gallbladder. Findings concerning for acute cholecystitis. Common bile duct borderline in diameter at 7 mm. Fatty infiltration of the liver. Electronically Signed   By: Rolm Baptise M.D.   On: 01/19/2019 03:21    Review of Systems  All other systems reviewed and are negative. Or as discussed in history of present illness  Blood pressure 119/89, pulse 66, temperature 98.2 F (36.8 C), temperature source Oral, resp. rate 16, height 5\' 2"  (1.575 m), weight (!) 154.2 kg, SpO2 99 %. Physical Exam  Constitutional: She is oriented to person, place, and time. She appears well-developed and well-nourished. No distress.  Super morbidly obese  HENT:   Head: Normocephalic and atraumatic.  Eyes: Pupils are equal, round, and reactive to light. No scleral icterus.  Neck: Normal range of motion. No tracheal deviation present.  Cardiovascular: Normal rate and regular rhythm.  Respiratory: Effort normal.  GI: Soft. She exhibits no distension. There is no abdominal tenderness. There is no rebound and no guarding.  Genitourinary:    Genitourinary Comments: Deferred   Musculoskeletal: Normal range of motion.     Comments: Uses a cane  Lymphadenopathy:  She has no cervical adenopathy.  Neurological: She is alert and oriented to person, place, and time.  Skin: Skin is warm and dry.  Psychiatric: She has a normal mood and affect. Her behavior is normal. Judgment and thought content normal.     Assessment/Plan This is a 58 year old woman with a body mass index of 62.19.  She has presented to the emergency department with abdominal pain, with a mild elevation in her white blood cell count and ultrasound consistent with acute cholecystitis.  She is currently comfortable.  I did discuss with her the possibility that our hospital may not be equipped to manage her surgical care, in light of her obesity.  Nonetheless, I plan to admit her for IV hydration, pain control, and IV antibiotics.  We will determine the disposition of her surgical care pending discussion with anesthesiology and the comfort with a patient of this size.  Fredirick Maudlin, MD 01/19/2019, 4:46 AM

## 2019-01-19 NOTE — ED Notes (Signed)
ED TO INPATIENT HANDOFF REPORT  ED Nurse Name and Phone #:  Allsion [phone]  S Name/Age/Gender Ashley Mccullough 58 y.o. female Room/Bed: ED16A/ED16A  Code Status   Code Status: Full Code  Home/SNF/Other Home Patient oriented to: self, place, time and situation Is this baseline? Yes   Triage Complete: Triage complete  Chief Complaint abdominal pain   Triage Note Pt brought in via ems from home with upper abd pain and vomiting.  Sx began today.  Diarrhea x 2 today. Pt also reports back pain.  Pt alert speech clear.     Allergies Allergies  Allergen Reactions  . Latex Itching  . Simvastatin     Other reaction(s): Unknown Memory changes Memory changes  . Sulfa Antibiotics     Other reaction(s): Unknown  . Levofloxacin Rash  . Prednisone Rash    Level of Care/Admitting Diagnosis ED Disposition    ED Disposition Condition [facility]: [place] [[id number]]  Level of Care: Med-Surg [16]  Covid Evaluation: N/A  Diagnosis: Cholecystitis, acute [960454]  Admitting Physician: Darreld Mclean  Attending Physician: Fredirick Maudlin [[id number]]  Bed request comments: 2 C  PT Class (Do Not Modify): Observation [104]  PT Acc Code (Do Not Modify): Observation [10022]       B Medical/Surgery History Past Medical History:  Diagnosis Date  . Allergy   . Anxiety   . Depression   . Hyperlipidemia   . Hypertension    Past Surgical History:  Procedure Laterality Date  . ABDOMINAL HYSTERECTOMY     still has ovaries     A IV Location/Drains/Wounds Patient Lines/Drains/Airways Status   Active Line/Drains/Airways    Name:   Placement date:   Placement time:   Site:   Days:   Peripheral IV 01/19/19 Left Arm   01/19/19    0208    Arm   less than 1   Peripheral IV 01/19/19 Right;Anterior Forearm   01/19/19    0308    Forearm   less than 1          Intake/Output Last 24 hours No intake or output data in the 24 hours  ending 01/19/19 0524  Labs/Imaging Results for orders placed or performed during the hospital encounter of 01/19/19 (from the past 48 hour(s))  Lipase, blood     Status: None   Collection Time: 01/18/19 11:54 PM  Result Value Ref Range   Lipase 29 11 - 51 U/L    Comment: Performed at [facility], [address] [place], [place] [address]  Comprehensive metabolic panel     Status: Abnormal   Collection Time: 01/18/19 11:54 PM  Result Value Ref Range   Sodium 140 135 - 145 mmol/L   Potassium 4.3 3.5 - 5.1 mmol/L   Chloride 105 98 - 111 mmol/L   CO2 24 22 - 32 mmol/L   Glucose, Bld 176 (H) 70 - 99 mg/dL   BUN 27 (H) 6 - 20 mg/dL   Creatinine, Ser 1.59 (H) 0.44 - 1.00 mg/dL   Calcium 9.2 8.9 - 10.3 mg/dL   Total Protein 8.2 (H) 6.5 - 8.1 g/dL   Albumin 3.7 3.5 - 5.0 g/dL   AST 204 (H) 15 - 41 U/L   ALT 68 (H) 0 - 44 U/L   Alkaline Phosphatase 150 (H) 38 - 126 U/L   Total Bilirubin 0.9 0.3 - 1.2 mg/dL   GFR calc non Af Amer 36 (L) >60 mL/min  GFR calc Af Amer 41 (L) >60 mL/min   Anion gap 11 5 - 15    Comment: Performed at [facility], [address] [place], [place] [address]  CBC     Status: Abnormal   Collection Time: 01/18/19 11:54 PM  Result Value Ref Range   WBC 12.1 (H) 4.0 - 10.5 K/uL   RBC 4.78 3.87 - 5.11 MIL/uL   Hemoglobin 13.1 12.0 - 15.0 g/dL   HCT 41.8 36.0 - 46.0 %   MCV 87.4 80.0 - 100.0 fL   MCH 27.4 26.0 - 34.0 pg   MCHC 31.3 30.0 - 36.0 g/dL   RDW 14.2 11.5 - 15.5 %   Platelets 292 150 - 400 K/uL   nRBC 0.0 0.0 - 0.2 %    Comment: Performed at [facility], [address] [place], [place] [address]  Urinalysis, Complete w Microscopic     Status: Abnormal   Collection Time: 01/19/19  3:50 AM  Result Value Ref Range   Color, Urine YELLOW (A) YELLOW   APPearance CLEAR (A) CLEAR   Specific Gravity, Urine 1.015 1.005 - 1.030   pH 8.0 5.0 - 8.0   Glucose, UA 50 (A) NEGATIVE mg/dL   Hgb urine dipstick NEGATIVE NEGATIVE    Bilirubin Urine NEGATIVE NEGATIVE   Ketones, ur NEGATIVE NEGATIVE mg/dL   Protein, ur 30 (A) NEGATIVE mg/dL   Nitrite NEGATIVE NEGATIVE   Leukocytes,Ua NEGATIVE NEGATIVE    Comment: Performed at [facility], [address] [place],  [address]   US Abdomen Limited Ruq  Result Date: 01/19/2019 CLINICAL DATA:  Right upper quadrant pain, vomiting EXAM: ULTRASOUND ABDOMEN LIMITED RIGHT UPPER QUADRANT COMPARISON:  None. FINDINGS: Gallbladder: Gallbladder wall is mildly thickened at 4 mm. Small stones and sludge within the gallbladder. Ring down artifact noted from the anterior wall compatible with adenomyomatosis. Patient was tender over the gallbladder during the study. Common bile duct: Diameter: Borderline dilated, 7 mm. The distal duct cannot be visualized due to overlying bowel gas. Liver: Increased echotexture compatible with fatty infiltration. No focal abnormality or biliary ductal dilatation. Portal vein is patent on color Doppler imaging with normal direction of blood flow towards the liver. IMPRESSION: Cholelithiasis and sludge within the gallbladder. Ring down artifact from the anterior gallbladder wall compatible with adenomyomatosis. Mild gallbladder wall thickening and tenderness over the gallbladder. Findings concerning for acute cholecystitis. Common bile duct borderline in diameter at 7 mm. Fatty infiltration of the liver. Electronically Signed   By: Rolm Baptise M.D.   On: 01/19/2019 03:21    Pending Labs Unresulted Labs (From admission, onward)    Start     Ordered   01/26/19 0500  Creatinine, serum  (enoxaparin (LOVENOX)    CrCl >/= 30 ml/min)  Weekly,   STAT    Comments:  while on enoxaparin therapy    01/19/19 0411   01/19/19 0409  HIV antibody (Routine Testing)  Once,   STAT     01/19/19 0411   01/19/19 0409  Creatinine, serum  (enoxaparin (LOVENOX)    CrCl >/= 30 ml/min)  Once,   STAT    Comments:  Baseline for enoxaparin therapy IF NOT ALREADY DRAWN.     01/19/19 0411          Vitals/Pain Today's Vitals   01/19/19 0217 01/19/19 0314 01/19/19 0330 01/19/19 0349  BP:   119/89   Pulse: 79  66   Resp:   16   Temp:  TempSrc:      SpO2: 100%  99%   Weight:      Height:      PainSc:  4   0-No pain    Isolation Precautions No active isolations  Medications Medications  sodium chloride flush (NS) 0.9 % injection 3 mL (3 mLs Intravenous Not Given 01/19/19 0353)  enoxaparin (LOVENOX) injection 40 mg (has no administration in time range)  lactated ringers infusion (has no administration in time range)  cefTRIAXone (ROCEPHIN) 2 g in sodium chloride 0.9 % 100 mL IVPB (2 g Intravenous New Bag/Given 01/19/19 0448)  ketorolac (TORADOL) 30 MG/ML injection 15 mg (has no administration in time range)  HYDROmorphone (DILAUDID) injection 0.5 mg (has no administration in time range)  acetaminophen (TYLENOL) tablet 650 mg (has no administration in time range)    Or  acetaminophen (TYLENOL) suppository 650 mg (has no administration in time range)  ondansetron (ZOFRAN-ODT) disintegrating tablet 4 mg (has no administration in time range)    Or  ondansetron (ZOFRAN) injection 4 mg (has no administration in time range)  azelastine (ASTELIN) 0.1 % nasal spray 1 spray (has no administration in time range)  buPROPion (WELLBUTRIN XL) 24 hr tablet 150 mg (has no administration in time range)  diltiazem (CARDIZEM CD) 24 hr capsule 240 mg (has no administration in time range)  montelukast (SINGULAIR) tablet 10 mg (has no administration in time range)  FLUoxetine (PROZAC) capsule 60 mg (has no administration in time range)  morphine 4 MG/ML injection 4 mg (4 mg Intravenous Given 01/19/19 0210)  ondansetron (ZOFRAN) injection 4 mg (4 mg Intravenous Given 01/19/19 0210)  sodium chloride 0.9 % bolus 1,000 mL (1,000 mLs Intravenous New Bag/Given 01/19/19 0353)    Mobility walks with device Moderate fall risk   Focused Assessments abdominal pain, nausea vomiting  and diarrhea since 5/5   R Recommendations: See Admitting Provider Note  Report given to:   Additional Notes:

## 2019-01-19 NOTE — ED Notes (Signed)
[place] staff states pt will be back in about another 10 mins.

## 2019-01-19 NOTE — ED Notes (Signed)
Pt given warm blankets. Pt states nausea has dec. Pt leaving for [place].

## 2019-01-19 NOTE — Progress Notes (Signed)
Patient seen and examined, reports her abdominal pain has resolved completely with pain medication received in ED. Accordingly, will continue with non-operative management at this time as initiated due to patient's super-morbid obesity. Discussed with patient her condition and anticipated laparoscopic cholecystectomy. Weight loss also discussed, and patient requests information about bariatric surgery to be provided with discharge instructions. Keep NPO except sips with medications for now, will recheck am CBC and LFT's. If continues to improve, anticipate clear liquids diet tomorrow morning.  -- Marilynne Drivers. Rosana Hoes, MD, [place]: [facility] - Partnering for exceptional care. Office: [phone]

## 2019-01-19 NOTE — ED Notes (Signed)
Pt back from [place], assisted to use the restroom, ambulated with steady gait.

## 2019-01-19 NOTE — ED Notes (Signed)
Attempted for 22g x2.

## 2019-01-19 NOTE — ED Notes (Signed)
Pt actively vomiting into emesis bag with head turned to side as this RN educated to keep from aspirating. HOB elevated as well.

## 2019-01-19 NOTE — Progress Notes (Signed)
Pharmacy lovenox dose adjustment  Patient admitted for RUQ abdominal pain found to have cholethiasis w/ sludge on [place] of abdomen. General surgery consulted, but no confirmed surgery or time for surgery.  Patient was ordered lovenox 40 mg subq daily for VTE prophylaxis. Patient's BMI > 40 CrCl 62.9 ml/min  Will increase lovenox dose from 40 mg daily to 40 mg twice daily. Dose scheduled to start 05/06 @ 2100 to give surgery time to consult and evaluate need for surgery, pharmacy to f/u on timing of VTE prophylaxis.  Tobie Lords, PharmD, BCPS Clinical Pharmacist 01/19/2019

## 2019-01-19 NOTE — ED Notes (Signed)
Pt updated on plan for admission

## 2019-01-19 NOTE — ED Notes (Signed)
Patient transported to rm 216

## 2019-01-19 NOTE — ED Provider Notes (Signed)
[facility] Emergency Department Provider Note    First MD Initiated Contact with Patient 01/19/19 [phone]     (approximate)  I have reviewed the triage vital signs and the nursing notes.   HISTORY  Chief Complaint Abdominal Pain and Emesis   HPI Ashley Mccullough is a 58 y.o. female with below list of previous medical conditions presents to the emergency department with 10 out of 10 upper abdominal discomfort with associated vomiting which patient states began last night.  Patient does admit to 2 episodes of diarrhea as well today.  Patient denies any fever afebrile on presentation.  Patient denies any urinary symptoms.  Patient does admit to continued nausea at this time.    Past Medical History:  Diagnosis Date   Allergy    Anxiety    Depression    Hyperlipidemia    Hypertension     Patient Active Problem List   Diagnosis Date Noted   Cholecystitis, acute 01/19/2019   Mild episode of recurrent major depressive disorder ([place]) 10/26/2018   BMI 50.0-59.9, adult ([place]) 07/01/2017   Morbid obesity due to excess calories ([place]) 12/24/2015   Allergic rhinitis 05/29/2015   Anxiety, generalized 05/29/2015   D (diarrhea) 05/29/2015   Avitaminosis D 05/29/2015   Borderline diabetes 05/29/2015   Fatigue 05/29/2015   Depression 03/15/2015   Hypertension 03/15/2015   Cannot sleep 11/13/2009   Carbuncle and furuncle 03/13/2008   Congenital pes planus 03/13/2008   CD (contact dermatitis) 04/29/2007   Hypercholesterolemia without hypertriglyceridemia 09/16/2006   Arthropathia 08/11/2006   Clinical depression 08/11/2006    Past Surgical History:  Procedure Laterality Date   ABDOMINAL HYSTERECTOMY     still has ovaries    Prior to Admission medications   Medication Sig Start Date End Date Taking? Authorizing Provider  acetaminophen (TYLENOL) 650 MG CR tablet Take 650-1,300 mg by mouth at bedtime. As needed for arthritis.    Yes [provider]  buPROPion (WELLBUTRIN XL) 150 MG 24 hr tablet TAKE 1 TABLET BY MOUTH EVERY DAY 08/31/18  Yes Fenton Malling M, PA-C  Cetirizine HCl (ZYRTEC ALLERGY) 10 MG CAPS Take by mouth. 07/30/16  Yes [provider]  Cholecalciferol (VITAMIN D3) 5000 units TABS Take 5,000 tablets by mouth daily.   Yes [provider]  Cyanocobalamin (VITAMIN B-12) 5000 MCG SUBL Place 5,000 mcg under the tongue daily.   Yes [provider]  diltiazem (CARDIZEM CD) 240 MG 24 hr capsule TAKE 1 CAPSULE BY MOUTH EVERY DAY 12/06/18  Yes Fenton Malling M, PA-C  FLUoxetine (PROZAC) 20 MG capsule TAKE 3 CAPSULES BY MOUTH EVERY DAY 09/02/18  Yes Fenton Malling M, PA-C  losartan (COZAAR) 25 MG tablet Take 2 tablets (50 mg total) by mouth daily. 07/26/18  Yes Mar Daring, PA-C  meloxicam (MOBIC) 15 MG tablet Take 1 tablet (15 mg total) by mouth daily. 09/03/18  Yes Burnette, Anderson Malta M, PA-C  metaxalone (SKELAXIN) 800 MG tablet TAKE 0.5-1 TABLETS (400-800 MG TOTAL) BY MOUTH 3 (THREE) TIMES DAILY AS NEEDED FOR MUSCLE SPASMS. 05/31/18  Yes Fenton Malling M, PA-C  montelukast (SINGULAIR) 10 MG tablet TAKE 1 TABLET BY MOUTH DAILY 01/04/19  Yes Mar Daring, PA-C    Allergies Latex; Simvastatin; Sulfa antibiotics; Levofloxacin; and Prednisone  Family History  Problem Relation Age of Onset   Cancer Mother        breast   Breast cancer Mother    Depression Father    Heart disease Father  Alcohol abuse Father    Hypertension Sister    Diabetes Brother    Diabetes Brother    Breast cancer Maternal Aunt    Breast cancer Cousin     Social History Social History   Tobacco Use   Smoking status: Never Smoker   Smokeless tobacco: Never Used  Substance Use Topics   Alcohol use: Not Currently   Drug use: No    Review of Systems Constitutional: No fever/chills Eyes: No visual changes. ENT: No sore throat. Cardiovascular:  Denies chest pain. Respiratory: Denies shortness of breath. Gastrointestinal: Positive for abdominal pain nausea vomiting and diarrhea Genitourinary: Negative for dysuria. Musculoskeletal: Negative for neck pain.  Negative for back pain. Integumentary: Negative for rash. Neurological: Negative for headaches, focal weakness or numbness.   ____________________________________________   PHYSICAL EXAM:  VITAL SIGNS: ED Triage Vitals  Enc Vitals Group     BP 01/18/19 2350 (!) 126/55     Pulse Rate 01/18/19 2350 96     Resp 01/18/19 2350 (!) 22     Temp 01/18/19 2350 98.2 F (36.8 C)     Temp Source 01/18/19 2350 Oral     SpO2 01/18/19 2350 98 %     Weight 01/18/19 2348 (!) 154.2 kg (340 lb)     Height 01/18/19 2348 1.575 m (5\' 2" )     Head Circumference --      Peak Flow --      Pain Score 01/18/19 2348 10     Pain Loc --      Pain Edu? --      Excl. in [place]? --     Constitutional: Alert and oriented.  Apparent discomfort  eyes: Conjunctivae are normal. Mouth/Throat: Mucous membranes are moist.  Oropharynx non-erythematous. Neck: No stridor.  Cardiovascular: Normal rate, regular rhythm. Good peripheral circulation. Grossly normal heart sounds. Respiratory: Normal respiratory effort.  No retractions. No audible wheezing. Gastrointestinal: Right upper quadrant tenderness to palpation.. No distention.  Musculoskeletal: No lower extremity tenderness nor edema. No gross deformities of extremities. Neurologic:  Normal speech and language. No gross focal neurologic deficits are appreciated.  Skin:  Skin is warm, dry and intact. No rash noted. Psychiatric: Mood and affect are normal. Speech and behavior are normal.  ____________________________________________   LABS (all labs ordered are listed, but only abnormal results are displayed)  Labs Reviewed  COMPREHENSIVE METABOLIC PANEL - Abnormal; Notable for the following components:      Result Value   Glucose, Bld 176 (*)     BUN 27 (*)    Creatinine, Ser 1.59 (*)    Total Protein 8.2 (*)    AST 204 (*)    ALT 68 (*)    Alkaline Phosphatase 150 (*)    GFR calc non Af Amer 36 (*)    GFR calc Af Amer 41 (*)    All other components within normal limits  CBC - Abnormal; Notable for the following components:   WBC 12.1 (*)    All other components within normal limits  URINALYSIS, COMPLETE (UACMP) WITH MICROSCOPIC - Abnormal; Notable for the following components:   Color, Urine YELLOW (*)    APPearance CLEAR (*)    Glucose, UA 50 (*)    Protein, ur 30 (*)    All other components within normal limits  LIPASE, BLOOD  HIV ANTIBODY (ROUTINE TESTING W REFLEX)  CREATININE, SERUM   ____________________________________________  EKG  ED ECG REPORT I, Garfield N Wisdom Rickey, the attending physician, personally viewed and interpreted  this ECG.   Date: 01/19/2019  EKG Time: 12:02 AM  Rate: 90  Rhythm: Normal sinus rhythm  Axis: Normal  Intervals: Normal  ST&T Change: None  ____________________________________________  RADIOLOGY I, Metcalfe N Mahoganie Basher, personally viewed and evaluated these images (plain radiographs) as part of my medical decision making, as well as reviewing the written report by the radiologist.  ED MD interpretation: Cholelithiasis with findings concerning for possible cholecystitis on right upper quadrant ultrasound per radiologist.  Official radiology report(s): US Abdomen Limited Ruq  Result Date: 01/19/2019 CLINICAL DATA:  Right upper quadrant pain, vomiting EXAM: ULTRASOUND ABDOMEN LIMITED RIGHT UPPER QUADRANT COMPARISON:  None. FINDINGS: Gallbladder: Gallbladder wall is mildly thickened at 4 mm. Small stones and sludge within the gallbladder. Ring down artifact noted from the anterior wall compatible with adenomyomatosis. Patient was tender over the gallbladder during the study. Common bile duct: Diameter: Borderline dilated, 7 mm. The distal duct cannot be visualized due to overlying bowel gas.  Liver: Increased echotexture compatible with fatty infiltration. No focal abnormality or biliary ductal dilatation. Portal vein is patent on color Doppler imaging with normal direction of blood flow towards the liver. IMPRESSION: Cholelithiasis and sludge within the gallbladder. Ring down artifact from the anterior gallbladder wall compatible with adenomyomatosis. Mild gallbladder wall thickening and tenderness over the gallbladder. Findings concerning for acute cholecystitis. Common bile duct borderline in diameter at 7 mm. Fatty infiltration of the liver. Electronically Signed   By: Rolm Baptise M.D.   On: 01/19/2019 03:21    ______________ Procedures   ____________________________________________   INITIAL IMPRESSION / MDM / [place] / ED COURSE  As part of my medical decision making, I reviewed the following data within the electronic MEDICAL RECORD NUMBER   58 year old female presenting with above-stated history and physical exam concerning for cholelithiasis/cholecystitis which was confirmed on ultrasound.  Patient's laboratory data also concerning for cholecystitis.  Patient discussed with Dr. Celine Ahr general surgeon on-call who will admit the patient for further evaluation and management     *Kynzie Polgar was evaluated in Emergency Department on 01/19/2019 for the symptoms described in the history of present illness. She was evaluated in the context of the global COVID-19 pandemic, which necessitated consideration that the patient might be at risk for infection with the SARS-CoV-2 virus that causes COVID-19. Institutional protocols and algorithms that pertain to the evaluation of patients at risk for COVID-19 are in a state of rapid change based on information released by regulatory bodies including the CDC and federal and state organizations. These policies and algorithms were followed during the patient's care in the  ED.*   ____________________________________________  FINAL CLINICAL IMPRESSION(S) / ED DIAGNOSES  Final diagnoses:  RUQ pain  Acute cholecystitis     MEDICATIONS GIVEN DURING THIS VISIT:  Medications  sodium chloride flush (NS) 0.9 % injection 3 mL (3 mLs Intravenous Not Given 01/19/19 0353)  enoxaparin (LOVENOX) injection 40 mg (has no administration in time range)  lactated ringers infusion (has no administration in time range)  cefTRIAXone (ROCEPHIN) 2 g in sodium chloride 0.9 % 100 mL IVPB (2 g Intravenous New Bag/Given 01/19/19 0448)  ketorolac (TORADOL) 30 MG/ML injection 15 mg (has no administration in time range)  HYDROmorphone (DILAUDID) injection 0.5 mg (has no administration in time range)  acetaminophen (TYLENOL) tablet 650 mg (has no administration in time range)    Or  acetaminophen (TYLENOL) suppository 650 mg (has no administration in time range)  ondansetron (ZOFRAN-ODT) disintegrating tablet 4 mg (has  no administration in time range)    Or  ondansetron (ZOFRAN) injection 4 mg (has no administration in time range)  azelastine (ASTELIN) 0.1 % nasal spray 1 spray (has no administration in time range)  buPROPion (WELLBUTRIN XL) 24 hr tablet 150 mg (has no administration in time range)  diltiazem (CARDIZEM CD) 24 hr capsule 240 mg (has no administration in time range)  montelukast (SINGULAIR) tablet 10 mg (has no administration in time range)  FLUoxetine (PROZAC) capsule 60 mg (has no administration in time range)  morphine 4 MG/ML injection 4 mg (4 mg Intravenous Given 01/19/19 0210)  ondansetron (ZOFRAN) injection 4 mg (4 mg Intravenous Given 01/19/19 0210)  sodium chloride 0.9 % bolus 1,000 mL (1,000 mLs Intravenous New Bag/Given 01/19/19 0353)     ED Discharge Orders    None       Note:  This document was prepared using Dragon voice recognition software and may include unintentional dictation errors.   Gregor Hams, MD 01/19/19 Delrae Rend

## 2019-01-19 NOTE — ED Notes (Signed)
DP Brown at bedside with this RN.

## 2019-01-20 DIAGNOSIS — K810 Acute cholecystitis: Secondary | ICD-10-CM | POA: Diagnosis not present

## 2019-01-20 LAB — COMPREHENSIVE METABOLIC PANEL
ALT: 247 U/L — ABNORMAL HIGH (ref 0–44)
AST: 288 U/L — ABNORMAL HIGH (ref 15–41)
Albumin: 3.2 g/dL — ABNORMAL LOW (ref 3.5–5.0)
Alkaline Phosphatase: 226 U/L — ABNORMAL HIGH (ref 38–126)
Anion gap: 8 (ref 5–15)
BUN: 22 mg/dL — ABNORMAL HIGH (ref 6–20)
CO2: 28 mmol/L (ref 22–32)
Calcium: 9.1 mg/dL (ref 8.9–10.3)
Chloride: 107 mmol/L (ref 98–111)
Creatinine, Ser: 1.28 mg/dL — ABNORMAL HIGH (ref 0.44–1.00)
GFR calc Af Amer: 54 mL/min — ABNORMAL LOW (ref >60)
GFR calc non Af Amer: 46 mL/min — ABNORMAL LOW (ref >60)
Glucose, Bld: 102 mg/dL — ABNORMAL HIGH (ref 70–99)
Potassium: 4.4 mmol/L (ref 3.5–5.1)
Sodium: 143 mmol/L (ref 135–145)
Total Bilirubin: 2 mg/dL — ABNORMAL HIGH (ref 0.3–1.2)
Total Protein: 6.7 g/dL (ref 6.5–8.1)

## 2019-01-20 LAB — CBC
HCT: 35.9 % — ABNORMAL LOW (ref 36.0–46.0)
Hemoglobin: 10.9 g/dL — ABNORMAL LOW (ref 12.0–15.0)
MCH: 27.7 pg (ref 26.0–34.0)
MCHC: 30.4 g/dL (ref 30.0–36.0)
MCV: 91.3 fL (ref 80.0–100.0)
Platelets: 232 10*3/uL (ref 150–400)
RBC: 3.93 MIL/uL (ref 3.87–5.11)
RDW: 14.5 % (ref 11.5–15.5)
WBC: 7.4 10*3/uL (ref 4.0–10.5)
nRBC: 0 % (ref 0.0–0.2)

## 2019-01-20 LAB — HIV ANTIBODY (ROUTINE TESTING W REFLEX): HIV Screen 4th Generation wRfx: NONREACTIVE

## 2019-01-20 LAB — BILIRUBIN, DIRECT: Bilirubin, Direct: 1 mg/dL — ABNORMAL HIGH (ref 0.0–0.2)

## 2019-01-20 NOTE — Progress Notes (Signed)
CC: cholecystitis  Subjective: Doing much better, no abd pain today. No fevers Bili and LFT up ? Reactive from cholecystitis, DB and ID 1 each. No clinical evidence of cholangitis No fevers or chills WBC and creat  trending down  Objective: Vital signs in last 24 hours: Temp:  [98.3 F (36.8 C)-98.9 F (37.2 C)] 98.9 F (37.2 C) (05/07 1215) Pulse Rate:  [60-61] 61 (05/07 1215) Resp:  [18-20] 20 (05/07 0523) BP: (150-154)/(59-75) 154/75 (05/07 1215) SpO2:  [97 %-100 %] 98 % (05/07 1215) Last BM Date: 01/18/19  Intake/Output from previous day: 05/06 0701 - 05/07 0700 In: 2786.2 [I.V.:2687.1; IV Piggyback:99.1] Out: 500 [Urine:500] Intake/Output this shift: Total I/O In: 540 [P.O.:540] Out: 500 [Urine:500]  Physical exam: NAD, alert, obese female Abd: soft, nt, no peritonitis or Murphy Ext: well perfused and no edema   Lab Results: CBC  Recent Labs    01/18/19 2354 01/20/19 0406  WBC 12.1* 7.4  HGB 13.1 10.9*  HCT 41.8 35.9*  PLT 292 232   BMET Recent Labs    01/18/19 2354 01/20/19 0406  NA 140 143  K 4.3 4.4  CL 105 107  CO2 24 28  GLUCOSE 176* 102*  BUN 27* 22*  CREATININE 1.59* 1.28*  CALCIUM 9.2 9.1   PT/INR No results for input(s): LABPROT, INR in the last 72 hours. ABG No results for input(s): PHART, HCO3 in the last 72 hours.  Invalid input(s): PCO2, PO2  Studies/Results: US Abdomen Limited Ruq  Result Date: 01/19/2019 CLINICAL DATA:  Right upper quadrant pain, vomiting EXAM: ULTRASOUND ABDOMEN LIMITED RIGHT UPPER QUADRANT COMPARISON:  None. FINDINGS: Gallbladder: Gallbladder wall is mildly thickened at 4 mm. Small stones and sludge within the gallbladder. Ring down artifact noted from the anterior wall compatible with adenomyomatosis. Patient was tender over the gallbladder during the study. Common bile duct: Diameter: Borderline dilated, 7 mm. The distal duct cannot be visualized due to overlying bowel gas. Liver: Increased echotexture  compatible with fatty infiltration. No focal abnormality or biliary ductal dilatation. Portal vein is patent on color Doppler imaging with normal direction of blood flow towards the liver. IMPRESSION: Cholelithiasis and sludge within the gallbladder. Ring down artifact from the anterior gallbladder wall compatible with adenomyomatosis. Mild gallbladder wall thickening and tenderness over the gallbladder. Findings concerning for acute cholecystitis. Common bile duct borderline in diameter at 7 mm. Fatty infiltration of the liver. Electronically Signed   By: Rolm Baptise M.D.   On: 01/19/2019 03:21    Anti-infectives: Anti-infectives (From admission, onward)   Start     Dose/Rate Route Frequency Ordered Stop   01/19/19 0415  cefTRIAXone (ROCEPHIN) 2 g in sodium chloride 0.9 % 100 mL IVPB     2 g 200 mL/hr over 30 Minutes Intravenous Every 24 hours 01/19/19 0411        Assessment/Plan:  Cholecystitis responding to a/bs therapy she does have increase LFT. We will repeat cmp tomorrow, if still elevated may need MRCP to r/o choledocho. Most improtantly clinically she is improving CLD today No need for emergent surgical intervention  Caroleen Hamman, MD, FACS  01/20/2019

## 2019-01-21 ENCOUNTER — Inpatient Hospital Stay: Payer: Managed Care, Other (non HMO)

## 2019-01-21 DIAGNOSIS — Z91040 Latex allergy status: Secondary | ICD-10-CM | POA: Diagnosis not present

## 2019-01-21 DIAGNOSIS — Z803 Family history of malignant neoplasm of breast: Secondary | ICD-10-CM | POA: Diagnosis not present

## 2019-01-21 DIAGNOSIS — E785 Hyperlipidemia, unspecified: Secondary | ICD-10-CM | POA: Diagnosis present

## 2019-01-21 DIAGNOSIS — I10 Essential (primary) hypertension: Secondary | ICD-10-CM | POA: Diagnosis present

## 2019-01-21 DIAGNOSIS — Z888 Allergy status to other drugs, medicaments and biological substances status: Secondary | ICD-10-CM | POA: Diagnosis not present

## 2019-01-21 DIAGNOSIS — Z881 Allergy status to other antibiotic agents status: Secondary | ICD-10-CM | POA: Diagnosis not present

## 2019-01-21 DIAGNOSIS — K810 Acute cholecystitis: Secondary | ICD-10-CM | POA: Diagnosis present

## 2019-01-21 DIAGNOSIS — Z833 Family history of diabetes mellitus: Secondary | ICD-10-CM | POA: Diagnosis not present

## 2019-01-21 DIAGNOSIS — Z818 Family history of other mental and behavioral disorders: Secondary | ICD-10-CM | POA: Diagnosis not present

## 2019-01-21 DIAGNOSIS — Z811 Family history of alcohol abuse and dependence: Secondary | ICD-10-CM | POA: Diagnosis not present

## 2019-01-21 DIAGNOSIS — Z6841 Body Mass Index (BMI) 40.0 and over, adult: Secondary | ICD-10-CM | POA: Diagnosis not present

## 2019-01-21 DIAGNOSIS — R109 Unspecified abdominal pain: Secondary | ICD-10-CM | POA: Diagnosis present

## 2019-01-21 DIAGNOSIS — Z882 Allergy status to sulfonamides status: Secondary | ICD-10-CM | POA: Diagnosis not present

## 2019-01-21 DIAGNOSIS — Z79899 Other long term (current) drug therapy: Secondary | ICD-10-CM | POA: Diagnosis not present

## 2019-01-21 DIAGNOSIS — Z791 Long term (current) use of non-steroidal anti-inflammatories (NSAID): Secondary | ICD-10-CM | POA: Diagnosis not present

## 2019-01-21 DIAGNOSIS — Z8249 Family history of ischemic heart disease and other diseases of the circulatory system: Secondary | ICD-10-CM | POA: Diagnosis not present

## 2019-01-21 DIAGNOSIS — Z90710 Acquired absence of both cervix and uterus: Secondary | ICD-10-CM | POA: Diagnosis not present

## 2019-01-21 DIAGNOSIS — F329 Major depressive disorder, single episode, unspecified: Secondary | ICD-10-CM | POA: Diagnosis present

## 2019-01-21 LAB — CBC
HCT: 34.7 % — ABNORMAL LOW (ref 36.0–46.0)
Hemoglobin: 10.9 g/dL — ABNORMAL LOW (ref 12.0–15.0)
MCH: 27.8 pg (ref 26.0–34.0)
MCHC: 31.4 g/dL (ref 30.0–36.0)
MCV: 88.5 fL (ref 80.0–100.0)
Platelets: 234 10*3/uL (ref 150–400)
RBC: 3.92 MIL/uL (ref 3.87–5.11)
RDW: 14.6 % (ref 11.5–15.5)
WBC: 8 10*3/uL (ref 4.0–10.5)
nRBC: 0 % (ref 0.0–0.2)

## 2019-01-21 LAB — COMPREHENSIVE METABOLIC PANEL
ALT: 211 U/L — ABNORMAL HIGH (ref 0–44)
AST: 168 U/L — ABNORMAL HIGH (ref 15–41)
Albumin: 3.1 g/dL — ABNORMAL LOW (ref 3.5–5.0)
Alkaline Phosphatase: 276 U/L — ABNORMAL HIGH (ref 38–126)
Anion gap: 9 (ref 5–15)
BUN: 18 mg/dL (ref 6–20)
CO2: 27 mmol/L (ref 22–32)
Calcium: 9.1 mg/dL (ref 8.9–10.3)
Chloride: 104 mmol/L (ref 98–111)
Creatinine, Ser: 1.23 mg/dL — ABNORMAL HIGH (ref 0.44–1.00)
GFR calc Af Amer: 56 mL/min — ABNORMAL LOW (ref >60)
GFR calc non Af Amer: 49 mL/min — ABNORMAL LOW (ref >60)
Glucose, Bld: 98 mg/dL (ref 70–99)
Potassium: 4.6 mmol/L (ref 3.5–5.1)
Sodium: 140 mmol/L (ref 135–145)
Total Bilirubin: 2 mg/dL — ABNORMAL HIGH (ref 0.3–1.2)
Total Protein: 7 g/dL (ref 6.5–8.1)

## 2019-01-21 MED ORDER — GADOBUTROL 1 MMOL/ML IV SOLN
10.0000 mL | Freq: Once | INTRAVENOUS | Status: AC | PRN
  Administered 2019-01-21: 15:00:00 10 mL via INTRAVENOUS

## 2019-01-21 NOTE — Progress Notes (Signed)
01/21/2019  Subjective: No acute events overnight.  Yesterday her total bilirubin had increased to 2.0.  Today is the same.  Denies any pain, nausea, vomiting has been tolerating clear liquids prior to this.  Vital signs: Temp:  [97.8 F (36.6 C)-98.9 F (37.2 C)] 97.8 F (36.6 C) (05/08 0419) Pulse Rate:  [62-68] 62 (05/08 0419) Resp:  [20] 20 (05/08 0419) BP: (153-165)/(74-87) 165/74 (05/08 0419) SpO2:  [98 %-100 %] 98 % (05/08 0419)   Intake/Output: 05/07 0701 - 05/08 0700 In: 540 [P.O.:540] Out: 700 [Urine:700] Last BM Date: 01/18/19  Physical Exam: Constitutional: No acute distress  Abdomen: Soft, obese, nondistended, nontender to palpation.  Labs:  Recent Labs    01/20/19 0406 01/21/19 0714  WBC 7.4 8.0  HGB 10.9* 10.9*  HCT 35.9* 34.7*  PLT 232 234   Recent Labs    01/20/19 0406 01/21/19 0714  NA 143 140  K 4.4 4.6  CL 107 104  CO2 28 27  GLUCOSE 102* 98  BUN 22* 18  CREATININE 1.28* 1.23*  CALCIUM 9.1 9.1   No results for input(s): LABPROT, INR in the last 72 hours.  Imaging: No results found.  Assessment/Plan: This is a 58 y.o. female with cholecystitis treated conservatively now with elevated LFTs.  -We will obtain MRCP today and make the patient n.p.o. for this. -If MRCP negative, then we will advance the patient's diet and potentially discharge to home today versus tomorrow after checking LFTs again.   Melvyn Neth, [facility]

## 2019-01-21 NOTE — Plan of Care (Signed)

## 2019-01-22 LAB — HEPATIC FUNCTION PANEL
ALT: 200 U/L — ABNORMAL HIGH (ref 0–44)
ALT: 237 U/L — ABNORMAL HIGH (ref 0–44)
AST: 135 U/L — ABNORMAL HIGH (ref 15–41)
AST: 229 U/L — ABNORMAL HIGH (ref 15–41)
Albumin: 3.4 g/dL — ABNORMAL LOW (ref 3.5–5.0)
Albumin: 3.7 g/dL (ref 3.5–5.0)
Alkaline Phosphatase: 311 U/L — ABNORMAL HIGH (ref 38–126)
Alkaline Phosphatase: 377 U/L — ABNORMAL HIGH (ref 38–126)
Bilirubin, Direct: 1.1 mg/dL — ABNORMAL HIGH (ref 0.0–0.2)
Bilirubin, Direct: 1.2 mg/dL — ABNORMAL HIGH (ref 0.0–0.2)
Indirect Bilirubin: 1.1 mg/dL — ABNORMAL HIGH (ref 0.3–0.9)
Indirect Bilirubin: 0.9 mg/dL (ref 0.3–0.9)
Total Bilirubin: 2.2 mg/dL — ABNORMAL HIGH (ref 0.3–1.2)
Total Bilirubin: 2.1 mg/dL — ABNORMAL HIGH (ref 0.3–1.2)
Total Protein: 7.4 g/dL (ref 6.5–8.1)
Total Protein: 8.1 g/dL (ref 6.5–8.1)

## 2019-01-22 MED ORDER — SODIUM CHLORIDE 0.9 % IV SOLN
INTRAVENOUS | Status: DC | PRN
  Administered 2019-01-22: 250 mL via INTRAVENOUS

## 2019-01-23 LAB — CBC WITH DIFFERENTIAL/PLATELET
Abs Immature Granulocytes: 0.06 10*3/uL (ref 0.00–0.07)
Basophils Absolute: 0 10*3/uL (ref 0.0–0.1)
Basophils Relative: 1 %
Eosinophils Absolute: 0.8 10*3/uL — ABNORMAL HIGH (ref 0.0–0.5)
Eosinophils Relative: 10 %
HCT: 35.4 % — ABNORMAL LOW (ref 36.0–46.0)
Hemoglobin: 11.4 g/dL — ABNORMAL LOW (ref 12.0–15.0)
Immature Granulocytes: 1 %
Lymphocytes Relative: 17 %
Lymphs Abs: 1.2 10*3/uL (ref 0.7–4.0)
MCH: 27.9 pg (ref 26.0–34.0)
MCHC: 32.2 g/dL (ref 30.0–36.0)
MCV: 86.8 fL (ref 80.0–100.0)
Monocytes Absolute: 0.6 10*3/uL (ref 0.1–1.0)
Monocytes Relative: 8 %
Neutro Abs: 4.8 10*3/uL (ref 1.7–7.7)
Neutrophils Relative %: 63 %
Platelets: 229 10*3/uL (ref 150–400)
RBC: 4.08 MIL/uL (ref 3.87–5.11)
RDW: 14.7 % (ref 11.5–15.5)
WBC: 7.5 10*3/uL (ref 4.0–10.5)
nRBC: 0 % (ref 0.0–0.2)

## 2019-01-23 LAB — BASIC METABOLIC PANEL
Anion gap: 11 (ref 5–15)
BUN: 18 mg/dL (ref 6–20)
CO2: 24 mmol/L (ref 22–32)
Calcium: 8.9 mg/dL (ref 8.9–10.3)
Chloride: 101 mmol/L (ref 98–111)
Creatinine, Ser: 1.38 mg/dL — ABNORMAL HIGH (ref 0.44–1.00)
GFR calc Af Amer: 49 mL/min — ABNORMAL LOW (ref >60)
GFR calc non Af Amer: 42 mL/min — ABNORMAL LOW (ref >60)
Glucose, Bld: 123 mg/dL — ABNORMAL HIGH (ref 70–99)
Potassium: 4 mmol/L (ref 3.5–5.1)
Sodium: 136 mmol/L (ref 135–145)

## 2019-01-23 LAB — HEPATIC FUNCTION PANEL
ALT: 246 U/L — ABNORMAL HIGH (ref 0–44)
AST: 227 U/L — ABNORMAL HIGH (ref 15–41)
Albumin: 3.2 g/dL — ABNORMAL LOW (ref 3.5–5.0)
Alkaline Phosphatase: 368 U/L — ABNORMAL HIGH (ref 38–126)
Bilirubin, Direct: 0.9 mg/dL — ABNORMAL HIGH (ref 0.0–0.2)
Indirect Bilirubin: 0.8 mg/dL (ref 0.3–0.9)
Total Bilirubin: 1.7 mg/dL — ABNORMAL HIGH (ref 0.3–1.2)
Total Protein: 7 g/dL (ref 6.5–8.1)

## 2019-01-23 LAB — PHOSPHORUS: Phosphorus: 3.7 mg/dL (ref 2.5–4.6)

## 2019-01-23 LAB — MAGNESIUM: Magnesium: 1.9 mg/dL (ref 1.7–2.4)

## 2019-01-23 MED ORDER — AMOXICILLIN-POT CLAVULANATE 875-125 MG PO TABS: 1.0000 | ORAL_TABLET | Freq: Two times a day (BID) | ORAL | 0 refills | Status: AC

## 2019-01-23 NOTE — Progress Notes (Signed)
Subjective:  CC: Ashley Mccullough is a 58 y.o. female  Hospital stay day 2,   acute cholecystitis  HPI: No issues.  Tolerating diet, no pain.  ROS:  General: Denies weight loss, weight gain, fatigue, fevers, chills, and night sweats. Heart: Denies chest pain, palpitations, racing heart, irregular heartbeat, leg pain or swelling, and decreased activity tolerance. Respiratory: Denies breathing difficulty, shortness of breath, wheezing, cough, and sputum. GI: Denies change in appetite, heartburn, nausea, vomiting, constipation, diarrhea, and blood in stool. GU: Denies difficulty urinating, pain with urinating, urgency, frequency, blood in urine.   Objective:   Temp:  [97.8 F (36.6 C)-98.4 F (36.9 C)] 97.8 F (36.6 C) (05/10 0700) Pulse Rate:  [57-77] 57 (05/10 0700) Resp:  [18-19] 18 (05/10 0700) BP: (140-168)/(69-72) 168/69 (05/10 0700) SpO2:  [98 %-100 %] 100 % (05/10 0700)     Height: 5\' 2"  (157.5 cm) Weight: (!) 149.4 kg BMI (Calculated): 60.23   Intake/Output this shift:   Intake/Output Summary (Last 24 hours) at 01/23/2019 0926 Last data filed at 01/23/2019 0702 Gross per 24 hour  Intake 580 ml  Output 1100 ml  Net -520 ml    Constitutional :  alert, cooperative, appears stated age and no distress  Respiratory:  clear to auscultation bilaterally  Cardiovascular:  regular rate and rhythm, S1, S2 normal, no murmur, click, rub or gallop  Gastrointestinal: soft, non-tender; bowel sounds normal; no masses,  no organomegaly.   Skin: Cool and moist.   Psychiatric: Normal affect, non-agitated, not confused       LABS:  CMP Latest Ref Rng & Units 01/23/2019 01/22/2019 01/22/2019  Glucose 70 - 99 mg/dL 123(H) - -  BUN 6 - 20 mg/dL 18 - -  Creatinine 0.44 - 1.00 mg/dL 1.38(H) - -  Sodium 135 - 145 mmol/L 136 - -  Potassium 3.5 - 5.1 mmol/L 4.0 - -  Chloride 98 - 111 mmol/L 101 - -  CO2 22 - 32 mmol/L 24 - -  Calcium 8.9 - 10.3 mg/dL 8.9 - -  Total Protein 6.5 - 8.1 g/dL  7.0 8.1 7.4  Total Bilirubin 0.3 - 1.2 mg/dL 1.7(H) 2.1(H) 2.2(H)  Alkaline Phos 38 - 126 U/L 368(H) 377(H) 311(H)  AST 15 - 41 U/L 227(H) 229(H) 135(H)  ALT 0 - 44 U/L 246(H) 237(H) 200(H)   CBC Latest Ref Rng & Units 01/23/2019 01/21/2019 01/20/2019  WBC 4.0 - 10.5 K/uL 7.5 8.0 7.4  Hemoglobin 12.0 - 15.0 g/dL 11.4(L) 10.9(L) 10.9(L)  Hematocrit 36.0 - 46.0 % 35.4(L) 34.7(L) 35.9(L)  Platelets 150 - 400 K/uL 229 234 232    RADS: n/a Assessment:   Cholecystitis treated with abx.  Slight bump in LFTs, but MRCP negative and asymptomatic.  Will repeat in am just to be sure no issues. Continue current management for now

## 2019-01-23 NOTE — Progress Notes (Signed)
MD ordered patient to be discharged home.  Discharge instructions were reviewed with the patient and she voiced understanding.  Patient instructed on making her follow-up appointment.  Prescription sent to patient's pharmacy.  IV was removed with catheter intact.  All patients questions were answered.  Patient waiting on family to get here to pick her up.

## 2019-01-23 NOTE — Discharge Instructions (Signed)
Acute cholecystitis-  Continue oral antibiotics as prescribed regardless of symptoms.  Try to avoid fatty foods or any specific foods that may exacerbate symptoms.  Call office with any questions and also ask how to set up  follow-up blood work that will be needed a day or two prior to your office appointment

## 2019-01-23 NOTE — Discharge Summary (Signed)
Physician Discharge Summary  Patient ID: Ashley Mccullough MRN: [id number] DOB/AGE: 04-27-1961 58 y.o.  Admit date: 01/19/2019 Discharge date: 01/23/2019  Admission Diagnoses: acute cholecystitis  Discharge Diagnoses:  Same as above  Discharged Condition: good  Hospital Course: Admitted for above.  Due to BMI, Pt not a candidate for surgical intervention.  Attempted antibiotics and clinically improved, asymptomatic at time of discharge.  LFTs trending upwards, so MRCP done which was negative.  LFTs subsequently started trending down.    Cr levels slowly increased through stay.  Denies any urinary symptoms, no flank pain, no other obvious clinical issues.  Will f/u as outpt basis as well as LFT level and any new/recurrent symptoms.  At time of discharge, asymptomatic, tolerating diet, voiding and having appropriate BMs  Consults: None  Discharge Exam: Blood pressure (!) 168/69, pulse (!) 57, temperature 97.8 F (36.6 C), temperature source Oral, resp. rate 18, height 5\' 2"  (1.575 m), weight (!) 149.4 kg, SpO2 100 %. General appearance: alert, cooperative and no distress GI: soft, non-tender; bowel sounds normal; no masses,  no organomegaly  Disposition:  Discharge disposition: 01-Home or Self Care       Discharge Instructions    Discharge patient   Complete by:  As directed    Discharge disposition:  01-Home or Self Care   Discharge patient date:  01/23/2019     Allergies as of 01/23/2019      Reactions   Latex Itching   Simvastatin    Other reaction(s): Unknown Memory changes Memory changes   Sulfa Antibiotics    Other reaction(s): Unknown   Levofloxacin Rash   Prednisone Rash      Medication List    TAKE these medications   acetaminophen 650 MG CR tablet Commonly known as:  TYLENOL Take 650-1,300 mg by mouth at bedtime. As needed for arthritis.   amoxicillin-clavulanate 875-125 MG tablet Commonly known as:  Augmentin Take 1 tablet by mouth 2 (two) times  daily for 7 days.   buPROPion 150 MG 24 hr tablet Commonly known as:  WELLBUTRIN XL TAKE 1 TABLET BY MOUTH EVERY DAY   diltiazem 240 MG 24 hr capsule Commonly known as:  CARDIZEM CD TAKE 1 CAPSULE BY MOUTH EVERY DAY   FLUoxetine 20 MG capsule Commonly known as:  PROZAC TAKE 3 CAPSULES BY MOUTH EVERY DAY   losartan 25 MG tablet Commonly known as:  COZAAR Take 2 tablets (50 mg total) by mouth daily.   meloxicam 15 MG tablet Commonly known as:  MOBIC Take 1 tablet (15 mg total) by mouth daily.   metaxalone 800 MG tablet Commonly known as:  SKELAXIN TAKE 0.5-1 TABLETS (400-800 MG TOTAL) BY MOUTH 3 (THREE) TIMES DAILY AS NEEDED FOR MUSCLE SPASMS.   montelukast 10 MG tablet Commonly known as:  SINGULAIR TAKE 1 TABLET BY MOUTH DAILY   Vitamin B-12 5000 MCG Subl Place 5,000 mcg under the tongue daily.   Vitamin D3 125 MCG (5000 UT) Tabs Take 5,000 tablets by mouth daily.   ZyrTEC Allergy 10 MG Caps Generic drug:  Cetirizine HCl Take by mouth.      Follow-up Information    Fredirick Maudlin, MD. Schedule an appointment as soon as possible for a visit in 7 day(s).   Specialty:  General Surgery Why:  f/u from recent hospitalization and recheck labs Contact information: [address] [place] [address] [phone]            Total time spent arranging discharge was >79min.  Signed: Benjamine Sprague 01/23/2019, 10:09 AM

## 2019-02-04 ENCOUNTER — Telehealth: Payer: Self-pay | Admitting: General Surgery

## 2019-02-04 NOTE — Telephone Encounter (Signed)
Patient is coming in on 02/09/2019 to see Dr. Celine Ahr and Dr. Celine Ahr put she would like the patient to have labs redrawn an patient can only use [facility] please call patient and let her know when the patient needs to go have her labs drawn.

## 2019-02-09 ENCOUNTER — Ambulatory Visit (INDEPENDENT_AMBULATORY_CARE_PROVIDER_SITE_OTHER): Payer: Managed Care, Other (non HMO) | Admitting: General Surgery

## 2019-02-09 ENCOUNTER — Other Ambulatory Visit: Payer: Self-pay

## 2019-02-09 ENCOUNTER — Encounter: Payer: Self-pay | Admitting: *Deleted

## 2019-02-09 ENCOUNTER — Encounter: Payer: Self-pay | Admitting: General Surgery

## 2019-02-09 VITALS — BP 170/87 | HR 91 | Temp 97.7°F | Ht 62.0 in | Wt 326.0 lb

## 2019-02-09 DIAGNOSIS — K829 Disease of gallbladder, unspecified: Secondary | ICD-10-CM

## 2019-02-09 NOTE — Patient Instructions (Addendum)
Patient to return as needed. The patient is aware to call back for any questions or concerns. 

## 2019-02-09 NOTE — Progress Notes (Signed)
Patient ID: Ashley Mccullough, female   DOB: Aug 05, 1961, 58 y.o.   MRN: [id number]  Chief Complaint  Patient presents with  . Follow-up    HPI Ashley Mccullough is a 58 y.o. female. She is here today as a follow-up from her hospitalization for cholecystitis at the beginning of May.  Due to her super morbid obesity, and limited resources available at our institution, she was treated conservatively with IV antibiotics, hydration, and pain control.  She was discharged home without undergoing surgery.  She states that since her discharge, she has had no recurrence of her pain.  She denies fevers or chills.  No nausea or vomiting.  No diarrhea or acholic stools.  She denies any jaundice or other yellowing of her eyes or skin.  She is interested in surgery so that she does not have to go through the pain that she suffered when she came to the hospital initially.   Past Medical History:  Diagnosis Date  . Allergy   . Anxiety   . Depression   . Hyperlipidemia   . Hypertension     Past Surgical History:  Procedure Laterality Date  . ABDOMINAL HYSTERECTOMY     still has ovaries    Family History  Problem Relation Age of Onset  . Cancer Mother        breast  . Breast cancer Mother   . Depression Father   . Heart disease Father   . Alcohol abuse Father   . Hypertension Sister   . Diabetes Brother   . Diabetes Brother   . Breast cancer Maternal Aunt   . Breast cancer Cousin     Social History Social History   Tobacco Use  . Smoking status: Never Smoker  . Smokeless tobacco: Never Used  Substance Use Topics  . Alcohol use: Not Currently  . Drug use: No    Allergies  Allergen Reactions  . Latex Itching  . Simvastatin     Other reaction(s): Unknown Memory changes Memory changes  . Sulfa Antibiotics     Other reaction(s): Unknown  . Levofloxacin Rash  . Prednisone Rash    Current Outpatient Medications  Medication Sig Dispense Refill  . acetaminophen (TYLENOL)  650 MG CR tablet Take 650-1,300 mg by mouth at bedtime. As needed for arthritis.    Marland Kitchen buPROPion (WELLBUTRIN XL) 150 MG 24 hr tablet TAKE 1 TABLET BY MOUTH EVERY DAY 90 tablet 1  . Cetirizine HCl (ZYRTEC ALLERGY) 10 MG CAPS Take by mouth.    . Cholecalciferol (VITAMIN D3) 5000 units TABS Take 5,000 tablets by mouth daily.    . Cyanocobalamin (VITAMIN B-12) 5000 MCG SUBL Place 5,000 mcg under the tongue daily.    Marland Kitchen diltiazem (CARDIZEM CD) 240 MG 24 hr capsule TAKE 1 CAPSULE BY MOUTH EVERY DAY 90 capsule 3  . FLUoxetine (PROZAC) 20 MG capsule TAKE 3 CAPSULES BY MOUTH EVERY DAY 270 capsule 1  . losartan (COZAAR) 25 MG tablet Take 2 tablets (50 mg total) by mouth daily. 180 tablet 3  . meloxicam (MOBIC) 15 MG tablet Take 1 tablet (15 mg total) by mouth daily. 90 tablet 1  . metaxalone (SKELAXIN) 800 MG tablet TAKE 0.5-1 TABLETS (400-800 MG TOTAL) BY MOUTH 3 (THREE) TIMES DAILY AS NEEDED FOR MUSCLE SPASMS. 90 tablet 1  . montelukast (SINGULAIR) 10 MG tablet TAKE 1 TABLET BY MOUTH DAILY 90 tablet 3   No current facility-administered medications for this visit.     Review of Systems Review  of Systems  All other systems reviewed and are negative. Are as per the history of present illness  Blood pressure (!) 170/87, pulse 91, temperature 97.7 F (36.5 C), temperature source Skin, height 5\' 2"  (1.575 m), weight (!) 326 lb (147.9 kg), SpO2 97 %.  Physical Exam Physical Exam Vitals signs reviewed.  Constitutional:      General: She is not in acute distress.    Appearance: She is obese.  HENT:     Head: Normocephalic and atraumatic.     Nose:     Comments: Covered by mask due to COVID-19    Mouth/Throat:     Comments: Covered by mask due to COVID-19 Eyes:     General: No scleral icterus.    Conjunctiva/sclera: Conjunctivae normal.  Neck:     Musculoskeletal: Normal range of motion.  Cardiovascular:     Rate and Rhythm: Normal rate and regular rhythm.  Pulmonary:     Effort: Pulmonary  effort is normal.  Abdominal:     General: There is no distension.     Palpations: Abdomen is soft.     Tenderness: There is no abdominal tenderness. There is no guarding or rebound.  Genitourinary:    Comments: Deferred Musculoskeletal: Normal range of motion.  Skin:    General: Skin is warm and dry.  Neurological:     General: No focal deficit present.     Mental Status: She is alert.  Psychiatric:        Mood and Affect: Mood normal.     Data Reviewed I reviewed the imaging and laboratory values from her initial hospitalization.  Right upper quadrant ultrasound that showed minimal wall thickening along with small stones and sludge within the gallbladder.  Common bile duct was borderline dilated.  Due to the common bile duct at the upper limits of normal, she underwent an MRCP that did not show any abnormalities within the extrahepatic biliary duct.  At the time of her admission, her laboratory studies showed a mild elevation in her white blood cell count and alkaline phosphatase, while her bilirubin was normal.  She did have elevated transaminases but also has a fatty liver based upon imaging studies.  Assessment This is a 58 year old super morbidly obese patient who presented to [facility] with findings concerning for acute cholecystitis.  At the time of her presentation, her body mass index was greater than 60, and her anesthesia team expressed significant concerns about proceeding to surgery with her, given our limited resources to manage any significant applications in a patient of her size.  She does not want to have another episode of the pain that she experienced at the time of her admission.  She is interested in having surgery.  Plan Due to her super morbid obesity, I think she will be best served at a larger institution with bariatric capabilities.  This would be either [facility] or [facility].  She prefers [facility].  We will place referral to the general and bariatric  surgery divisions to have her evaluated for an elective cholecystectomy.  She will see [place] on an as-needed basis.    Fredirick Maudlin 02/09/2019, 10:33 AM

## 2019-02-09 NOTE — Progress Notes (Signed)
Records faxed to [facility] and fax confirmation received. Phone: [phone] Fax: [phone]  Their office will be contacting patient directly in regards to an appointment.

## 2019-03-23 ENCOUNTER — Other Ambulatory Visit: Payer: Self-pay | Admitting: Physician Assistant

## 2019-03-23 DIAGNOSIS — F329 Major depressive disorder, single episode, unspecified: Secondary | ICD-10-CM

## 2019-03-23 DIAGNOSIS — F32A Depression, unspecified: Secondary | ICD-10-CM

## 2019-03-30 ENCOUNTER — Telehealth: Payer: Self-pay | Admitting: Physician Assistant

## 2019-03-30 NOTE — Telephone Encounter (Signed)
FMLA paper work faxed yesterday. Patient advised.

## 2019-03-30 NOTE — Telephone Encounter (Signed)
Pt called regarding FMLA papers that she brought to the office to be competed.  She says she brought them a bout two weeks ago and needs them sent in by 7/22.    She has not heard anything back regarding id they are ready  CB#  [phone]  Thanks Con Memos

## 2019-04-15 ENCOUNTER — Other Ambulatory Visit: Payer: Self-pay | Admitting: Physical Medicine and Rehabilitation

## 2019-04-15 DIAGNOSIS — M5441 Lumbago with sciatica, right side: Secondary | ICD-10-CM

## 2019-04-15 DIAGNOSIS — M5442 Lumbago with sciatica, left side: Secondary | ICD-10-CM

## 2019-04-25 ENCOUNTER — Ambulatory Visit
Admission: RE | Admit: 2019-04-25 | Discharge: 2019-04-25 | Disposition: A | Discharge status: Home / Self Care | Payer: Managed Care, Other (non HMO) | Source: Ambulatory Visit | Attending: Physical Medicine and Rehabilitation | Admitting: Physical Medicine and Rehabilitation

## 2019-04-25 ENCOUNTER — Other Ambulatory Visit: Payer: Self-pay

## 2019-04-25 DIAGNOSIS — M5441 Lumbago with sciatica, right side: Secondary | ICD-10-CM

## 2019-04-25 DIAGNOSIS — M5442 Lumbago with sciatica, left side: Secondary | ICD-10-CM | POA: Insufficient documentation

## 2019-04-28 ENCOUNTER — Other Ambulatory Visit: Payer: Self-pay | Admitting: Physician Assistant

## 2019-04-28 DIAGNOSIS — I10 Essential (primary) hypertension: Secondary | ICD-10-CM

## 2019-04-28 MED ORDER — LOSARTAN POTASSIUM 50 MG PO TABS: 50.0000 mg | ORAL_TABLET | Freq: Every day | ORAL | 3 refills | Status: DC

## 2019-04-28 MED ORDER — LOSARTAN POTASSIUM 25 MG PO TABS: 50.0000 mg | ORAL_TABLET | Freq: Every day | ORAL | 3 refills | Status: DC

## 2019-04-28 NOTE — Progress Notes (Signed)
Losartan refilled to [facility]

## 2019-04-28 NOTE — Telephone Encounter (Signed)
[facility] faxed refill request for the following medications:  losartan (COZAAR) 25 MG tablet   Please advise.  

## 2019-05-26 ENCOUNTER — Other Ambulatory Visit: Payer: Self-pay | Admitting: Physician Assistant

## 2019-05-26 DIAGNOSIS — M5431 Sciatica, right side: Secondary | ICD-10-CM

## 2019-05-26 DIAGNOSIS — M5432 Sciatica, left side: Secondary | ICD-10-CM

## 2019-05-26 DIAGNOSIS — M62838 Other muscle spasm: Secondary | ICD-10-CM

## 2019-05-26 MED ORDER — METAXALONE 800 MG PO TABS: 400.0000 mg | ORAL_TABLET | Freq: Three times a day (TID) | ORAL | 1 refills | Status: DC | PRN

## 2019-10-07 ENCOUNTER — Other Ambulatory Visit: Payer: Self-pay

## 2019-10-07 ENCOUNTER — Encounter: Payer: Self-pay | Admitting: Physician Assistant

## 2019-10-07 ENCOUNTER — Ambulatory Visit: Payer: Managed Care, Other (non HMO) | Admitting: Physician Assistant

## 2019-10-07 VITALS — BP 122/86 | Temp 96.9°F | Wt 316.2 lb

## 2019-10-07 DIAGNOSIS — L0291 Cutaneous abscess, unspecified: Secondary | ICD-10-CM | POA: Diagnosis not present

## 2019-10-07 MED ORDER — AMOXICILLIN-POT CLAVULANATE 875-125 MG PO TABS: 1.0000 | ORAL_TABLET | Freq: Two times a day (BID) | ORAL | 0 refills | Status: AC

## 2019-10-07 NOTE — Patient Instructions (Signed)

## 2019-10-07 NOTE — Progress Notes (Signed)
Patient: Ashley Mccullough Female    DOB: Mar 18, 1961   59 y.o.   MRN: [id number] Visit Date: 10/07/2019  Today's Provider: Trinna Post, PA-C   Chief Complaint  Patient presents with  . Recurrent Skin Infections   Subjective:     HPI   Boil Patient presents today for recheck on a boil that is under her right breast area. Patient states that her bra be rubbing the area and it is tender. Patient states she went to urgent care and was prescribed doxycycline for seven days and have finished it up. It continues to drain and she believes it is still infected. She denies fevers, chills, nausea, vomiting.   She was seen for something similar in 2017. Culture of abscess showed proteus mirabilis which was resistant to doxycycline.   Allergies  Allergen Reactions  . Latex Itching  . Simvastatin     Other reaction(s): Unknown Memory changes Memory changes  . Sulfa Antibiotics     Other reaction(s): Unknown  . Levofloxacin Rash  . Prednisone Rash     Current Outpatient Medications:  .  acetaminophen (TYLENOL) 650 MG CR tablet, Take 650-1,300 mg by mouth at bedtime. As needed for arthritis., Disp: , Rfl:  .  buPROPion (WELLBUTRIN XL) 150 MG 24 hr tablet, TAKE 1 TABLET BY MOUTH EVERY DAY, Disp: 90 tablet, Rfl: 1 .  Cetirizine HCl (ZYRTEC ALLERGY) 10 MG CAPS, Take by mouth., Disp: , Rfl:  .  Cholecalciferol (VITAMIN D3) 5000 units TABS, Take 5,000 tablets by mouth daily., Disp: , Rfl:  .  Cyanocobalamin (VITAMIN B-12) 5000 MCG SUBL, Place 5,000 mcg under the tongue daily., Disp: , Rfl:  .  diltiazem (CARDIZEM CD) 240 MG 24 hr capsule, TAKE 1 CAPSULE BY MOUTH EVERY DAY, Disp: 90 capsule, Rfl: 3 .  FLUoxetine (PROZAC) 20 MG capsule, TAKE 3 CAPSULES BY MOUTH EVERY DAY, Disp: 270 capsule, Rfl: 1 .  losartan (COZAAR) 50 MG tablet, Take 1 tablet (50 mg total) by mouth daily., Disp: 90 tablet, Rfl: 3 .  meloxicam (MOBIC) 15 MG tablet, TAKE 1 TABLET(15 MG) BY MOUTH DAILY, Disp: 90  tablet, Rfl: 1 .  metaxalone (SKELAXIN) 800 MG tablet, Take 0.5-1 tablets (400-800 mg total) by mouth 3 (three) times daily as needed for muscle spasms., Disp: 90 tablet, Rfl: 1 .  montelukast (SINGULAIR) 10 MG tablet, TAKE 1 TABLET BY MOUTH DAILY, Disp: 90 tablet, Rfl: 3  Review of Systems  Social History   Tobacco Use  . Smoking status: Never Smoker  . Smokeless tobacco: Never Used  Substance Use Topics  . Alcohol use: Not Currently      Objective:   BP 122/86 (BP Location: Left Wrist, Patient Position: Sitting, Cuff Size: Normal)   Temp (!) 96.9 F (36.1 C) (Temporal)   Wt (!) 316 lb 3.2 oz (143.4 kg)   BMI 57.83 kg/m  Vitals:   10/07/19 1352  BP: 122/86  Temp: (!) 96.9 F (36.1 C)  TempSrc: Temporal  Weight: (!) 316 lb 3.2 oz (143.4 kg)  Body mass index is 57.83 kg/m.   Physical Exam Constitutional:      Appearance: Normal appearance. She is obese.  Abdominal:    Skin:    General: Skin is warm and dry.     Findings: Lesion present.  Neurological:     Mental Status: She is alert and oriented to person, place, and time. Mental status is at baseline.  Psychiatric:  Mood and Affect: Mood normal.        Behavior: Behavior normal.      No results found for any visits on 10/07/19.     Assessment & Plan    1. Abscess  Will go based on prior culture and change antibiotic to augmentin which was sensitive. Of note, she has not experienced allergy to sulfa but her sister has steven johnsons syndrome to it. May be option if augmentin is not effective.   - amoxicillin-clavulanate (AUGMENTIN) 875-125 MG tablet; Take 1 tablet by mouth 2 (two) times daily for 10 days.  Dispense: 20 tablet; Refill: 0  The entirety of the information documented in the History of Present Illness, Review of Systems and Physical Exam were personally obtained by me. Portions of this information were initially documented by [facility] and reviewed by me for thoroughness and  accuracy.      Trinna Post, PA-C  [facility]

## 2019-10-21 ENCOUNTER — Other Ambulatory Visit: Payer: Self-pay | Admitting: Physician Assistant

## 2019-10-21 DIAGNOSIS — F411 Generalized anxiety disorder: Secondary | ICD-10-CM

## 2019-10-21 DIAGNOSIS — F32A Depression, unspecified: Secondary | ICD-10-CM

## 2019-10-21 DIAGNOSIS — F329 Major depressive disorder, single episode, unspecified: Secondary | ICD-10-CM

## 2019-10-21 NOTE — Telephone Encounter (Signed)
Requested Prescriptions  Pending Prescriptions Disp Refills  . FLUoxetine (PROZAC) 20 MG capsule [Pharmacy Med Name: FLUOXETINE 20MG  CAPSULES] 270 capsule 1    Sig: TAKE 3 CAPSULES BY MOUTH EVERY DAY     Psychiatry:  Antidepressants - SSRI Failed - 10/21/2019  9:30 AM      Failed - Completed PHQ-2 or PHQ-9 in the last 360 days.      Passed - Valid encounter within last 6 months    Recent Outpatient Visits          2 weeks ago [place], [place], [place]   1 year ago Bilateral sciatica   [place], Clearnce Sorrel, [place]   1 year ago Annual physical exam   [place], Clearnce Sorrel, [place]   1 year ago Bilateral sciatica   [facility] [place], [place]   1 year ago Bilateral sciatica   [place], [place], [place]             . buPROPion (WELLBUTRIN XL) 150 MG 24 hr tablet [Pharmacy Med Name: BUPROPION XL 150MG  TABLETS (24 H)] 90 tablet 1    Sig: TAKE 1 TABLET BY MOUTH EVERY DAY     Psychiatry: Antidepressants - bupropion Failed - 10/21/2019  9:30 AM      Failed - Completed PHQ-2 or PHQ-9 in the last 360 days.      Passed - Last BP in normal range    BP Readings from Last 1 Encounters:  10/07/19 122/86         Passed - Valid encounter within last 6 months    Recent Outpatient Visits          2 weeks ago Abscess   [facility] Trinna Post, [place]   1 year ago Bilateral sciatica   [place], Clearnce Sorrel, [place]   1 year ago Annual physical exam   [place], Clearnce Sorrel, [place]   1 year ago Bilateral sciatica   [place], Clearnce Sorrel, [place]   1 year ago Bilateral sciatica   [place], [place], [place]

## 2019-11-18 ENCOUNTER — Ambulatory Visit: Payer: Managed Care, Other (non HMO) | Admitting: Physician Assistant

## 2019-11-18 ENCOUNTER — Encounter: Payer: Self-pay | Admitting: Physician Assistant

## 2019-11-18 ENCOUNTER — Other Ambulatory Visit: Payer: Self-pay

## 2019-11-18 VITALS — BP 157/95 | HR 73 | Temp 97.5°F | Wt 315.0 lb

## 2019-11-18 DIAGNOSIS — Z6841 Body Mass Index (BMI) 40.0 and over, adult: Secondary | ICD-10-CM | POA: Diagnosis not present

## 2019-11-18 DIAGNOSIS — M5416 Radiculopathy, lumbar region: Secondary | ICD-10-CM

## 2019-11-18 DIAGNOSIS — R7303 Prediabetes: Secondary | ICD-10-CM | POA: Diagnosis not present

## 2019-11-18 DIAGNOSIS — F330 Major depressive disorder, recurrent, mild: Secondary | ICD-10-CM | POA: Diagnosis not present

## 2019-11-18 DIAGNOSIS — M51379 Other intervertebral disc degeneration, lumbosacral region without mention of lumbar back pain or lower extremity pain: Secondary | ICD-10-CM

## 2019-11-18 DIAGNOSIS — G8929 Other chronic pain: Secondary | ICD-10-CM | POA: Insufficient documentation

## 2019-11-18 DIAGNOSIS — E7800 Pure hypercholesterolemia, unspecified: Secondary | ICD-10-CM

## 2019-11-18 DIAGNOSIS — M5137 Other intervertebral disc degeneration, lumbosacral region: Secondary | ICD-10-CM | POA: Insufficient documentation

## 2019-11-18 DIAGNOSIS — E559 Vitamin D deficiency, unspecified: Secondary | ICD-10-CM

## 2019-11-18 DIAGNOSIS — Z1211 Encounter for screening for malignant neoplasm of colon: Secondary | ICD-10-CM

## 2019-11-18 DIAGNOSIS — R748 Abnormal levels of other serum enzymes: Secondary | ICD-10-CM

## 2019-11-18 DIAGNOSIS — M48062 Spinal stenosis, lumbar region with neurogenic claudication: Secondary | ICD-10-CM

## 2019-11-18 NOTE — Patient Instructions (Signed)
Wound Care, Adult Taking care of your wound properly can help to prevent pain, infection, and scarring. It can also help your wound to heal more quickly. How to care for your wound Wound care      Follow instructions from your health care provider about how to take care of your wound. Make sure you: ? Wash your hands with soap and water before you change the bandage (dressing). If soap and water are not available, use hand sanitizer. ? Change your dressing as told by your health care provider. ? Leave stitches (sutures), skin glue, or adhesive strips in place. These skin closures may need to stay in place for 2 weeks or longer. If adhesive strip edges start to loosen and curl up, you may trim the loose edges. Do not remove adhesive strips completely unless your health care provider tells you to do that.  Check your wound area every day for signs of infection. Check for: ? Redness, swelling, or pain. ? Fluid or blood. ? Warmth. ? Pus or a bad smell.  Ask your health care provider if you should clean the wound with mild soap and water. Doing this may include: ? Using a clean towel to pat the wound dry after cleaning it. Do not rub or scrub the wound. ? Applying a cream or ointment. Do this only as told by your health care provider. ? Covering the incision with a clean dressing.  Ask your health care provider when you can leave the wound uncovered.  Keep the dressing dry until your health care provider says it can be removed. Do not take baths, swim, use a hot tub, or do anything that would put the wound underwater until your health care provider approves. Ask your health care provider if you can take showers. You may only be allowed to take sponge baths. Medicines   If you were prescribed an antibiotic medicine, cream, or ointment, take or use the antibiotic as told by your health care provider. Do not stop taking or using the antibiotic even if your condition improves.  Take  over-the-counter and prescription medicines only as told by your health care provider. If you were prescribed pain medicine, take it 30 or more minutes before you do any wound care or as told by your health care provider. General instructions  Return to your normal activities as told by your health care provider. Ask your health care provider what activities are safe.  Do not scratch or pick at the wound.  Do not use any products that contain nicotine or tobacco, such as cigarettes and e-cigarettes. These may delay wound healing. If you need help quitting, ask your health care provider.  Keep all follow-up visits as told by your health care provider. This is important.  Eat a diet that includes protein, vitamin A, vitamin C, and other nutrient-rich foods to help the wound heal. ? Foods rich in protein include meat, dairy, beans, nuts, and other sources. ? Foods rich in vitamin A include carrots and dark green, leafy vegetables. ? Foods rich in vitamin C include citrus, tomatoes, and other fruits and vegetables. ? Nutrient-rich foods have protein, carbohydrates, fat, vitamins, or minerals. Eat a variety of healthy foods including vegetables, fruits, and whole grains. Contact a health care provider if:  You received a tetanus shot and you have swelling, severe pain, redness, or bleeding at the injection site.  Your pain is not controlled with medicine.  You have redness, swelling, or pain around the wound.    You have fluid or blood coming from the wound.  Your wound feels warm to the touch.  You have pus or a bad smell coming from the wound.  You have a fever or chills.  You are nauseous or you vomit.  You are dizzy. Get help right away if:  You have a red streak going away from your wound.  The edges of the wound open up and separate.  Your wound is bleeding, and the bleeding does not stop with gentle pressure.  You have a rash.  You faint.  You have trouble  breathing. Summary  Always wash your hands with soap and water before changing your bandage (dressing).  To help with healing, eat foods that are rich in protein, vitamin A, vitamin C, and other nutrients.  Check your wound every day for signs of infection. Contact your health care provider if you suspect that your wound is infected. This information is not intended to replace advice given to you by your health care provider. Make sure you discuss any questions you have with your health care provider. Document Revised: 12/20/2018 Document Reviewed: 03/18/2016 Elsevier Patient Education  2020 Elsevier Inc.  

## 2019-11-18 NOTE — Progress Notes (Signed)
Patient: Ashley Mccullough Female    DOB: 22-Jul-1961   59 y.o.   MRN: [id number] Visit Date: 11/18/2019  Today's Provider: Mar Daring, PA-C   Chief Complaint  Patient presents with  . Form Completion    Pt needs to have FMLA papers filled out.   . Depression  . Back Pain  . Wound Check   Subjective:     HPI   Pt is here to fill out FMLA papers for low back pain and sciatica. She reports she has been getting injections in her back for the sciatica and it is doing ok currently.   She does also continue to have a wound on the right abdominal wall. It started as an abscess that was able to auto-drain. She has been applying auqaphor lotion, occasional neosporin and covering with a dry dressing.   Depression is stable currently. Has good days and bad days. On bad days she likes to be by herself and will read or talk to herself for coping mechanisms.   Allergies  Allergen Reactions  . Latex Itching  . Simvastatin     Other reaction(s): Unknown Memory changes Memory changes  . Sulfa Antibiotics     Other reaction(s): Unknown  . Levofloxacin Rash  . Prednisone Rash     Current Outpatient Medications:  .  acetaminophen (TYLENOL) 650 MG CR tablet, Take 650-1,300 mg by mouth at bedtime. As needed for arthritis., Disp: , Rfl:  .  buPROPion (WELLBUTRIN XL) 150 MG 24 hr tablet, TAKE 1 TABLET BY MOUTH EVERY DAY, Disp: 90 tablet, Rfl: 1 .  Cetirizine HCl (ZYRTEC ALLERGY) 10 MG CAPS, Take by mouth., Disp: , Rfl:  .  Cholecalciferol (VITAMIN D3) 5000 units TABS, Take 5,000 tablets by mouth daily., Disp: , Rfl:  .  COLLAGEN PO, Take by mouth., Disp: , Rfl:  .  Cyanocobalamin (VITAMIN B-12) 5000 MCG SUBL, Place 5,000 mcg under the tongue daily., Disp: , Rfl:  .  diltiazem (CARDIZEM CD) 240 MG 24 hr capsule, TAKE 1 CAPSULE BY MOUTH EVERY DAY, Disp: 90 capsule, Rfl: 3 .  FLUoxetine (PROZAC) 20 MG capsule, TAKE 3 CAPSULES BY MOUTH EVERY DAY, Disp: 270 capsule, Rfl: 1 .   losartan (COZAAR) 50 MG tablet, Take 1 tablet (50 mg total) by mouth daily., Disp: 90 tablet, Rfl: 3 .  meloxicam (MOBIC) 15 MG tablet, TAKE 1 TABLET(15 MG) BY MOUTH DAILY, Disp: 90 tablet, Rfl: 1 .  metaxalone (SKELAXIN) 800 MG tablet, Take 0.5-1 tablets (400-800 mg total) by mouth 3 (three) times daily as needed for muscle spasms., Disp: 90 tablet, Rfl: 1 .  montelukast (SINGULAIR) 10 MG tablet, TAKE 1 TABLET BY MOUTH DAILY, Disp: 90 tablet, Rfl: 3 .  traMADol (ULTRAM) 50 MG tablet, 1/2-1 po bid prn, Disp: , Rfl:   Review of Systems  Constitutional: Negative.   Respiratory: Negative.   Cardiovascular: Negative.   Musculoskeletal: Positive for back pain and gait problem.  Skin: Positive for wound. Negative for color change, pallor and rash.  Neurological: Positive for weakness and numbness.  Psychiatric/Behavioral: Positive for dysphoric mood. Negative for self-injury, sleep disturbance and suicidal ideas. The patient is nervous/anxious.     Social History   Tobacco Use  . Smoking status: Never Smoker  . Smokeless tobacco: Never Used  Substance Use Topics  . Alcohol use: Not Currently      Objective:   BP (!) 157/95 (BP Location: Left Wrist, Patient Position: Sitting, Cuff Size: Large)  Pulse 73   Temp (!) 97.5 F (36.4 C) (Temporal)   Wt (!) 315 lb (142.9 kg)   BMI 57.61 kg/m  Vitals:   11/18/19 1650  BP: (!) 157/95  Pulse: 73  Temp: (!) 97.5 F (36.4 C)  TempSrc: Temporal  Weight: (!) 315 lb (142.9 kg)  Body mass index is 57.61 kg/m.   Physical Exam Vitals reviewed.  Constitutional:      General: She is not in acute distress.    Appearance: Normal appearance. She is well-developed. She is obese. She is not ill-appearing or diaphoretic.  Cardiovascular:     Rate and Rhythm: Normal rate and regular rhythm.     Pulses: Normal pulses.     Heart sounds: Normal heart sounds. No murmur. No friction rub. No gallop.   Pulmonary:     Effort: Pulmonary effort is  normal. No respiratory distress.     Breath sounds: Normal breath sounds. No wheezing or rales.  Musculoskeletal:     Cervical back: Normal range of motion and neck supple.  Skin:    Comments: See picture attached  Neurological:     Mental Status: She is alert.  Psychiatric:        Mood and Affect: Mood normal.        Behavior: Behavior normal.        Thought Content: Thought content normal.        Judgment: Judgment normal.        No results found for any visits on 11/18/19.     Assessment & Plan    1. Mild episode of recurrent major depressive disorder (HCC) Stable. Continue bupropion xl 150mg  daily, fluoxetine 20mg  daily. Will check labs as below and f/u pending results. - CBC w/Diff/Platelet - Comprehensive Metabolic Panel (CMET) - TSH - Lipid Panel With LDL/HDL Ratio - HgB A1c - Vitamin D (25 hydroxy) - B12 and Folate Panel  2. BMI 50.0-59.9, adult ([place]) Counseled patient on healthy lifestyle modifications including dieting and exercise.  Will check labs as below and f/u pending results. - CBC w/Diff/Platelet - Comprehensive Metabolic Panel (CMET) - TSH - Lipid Panel With LDL/HDL Ratio - HgB A1c - Vitamin D (25 hydroxy) - B12 and Folate Panel  3. Hypercholesterolemia without hypertriglyceridemia Diet controlled. Had adverse reactions to statins. Will check labs as below and f/u pending results. - CBC w/Diff/Platelet - Comprehensive Metabolic Panel (CMET) - TSH - Lipid Panel With LDL/HDL Ratio - HgB A1c - Vitamin D (25 hydroxy) - B12 and Folate Panel  4. Borderline diabetes Diet controlled. Will check labs as below and f/u pending results. - CBC w/Diff/Platelet - Comprehensive Metabolic Panel (CMET) - TSH - Lipid Panel With LDL/HDL Ratio - HgB A1c - Vitamin D (25 hydroxy) - B12 and Folate Panel  5. Avitaminosis D H/O this. Will check labs as below and f/u pending results. - CBC w/Diff/Platelet - Comprehensive Metabolic Panel (CMET) - TSH -  Lipid Panel With LDL/HDL Ratio - HgB A1c - Vitamin D (25 hydroxy) - B12 and Folate Panel  6. Lumbar radiculitis Currently managing well. Gets injections when flares. Has had 2 rounds of injections that offer good relief.  - CBC w/Diff/Platelet - Comprehensive Metabolic Panel (CMET) - TSH - Lipid Panel With LDL/HDL Ratio - HgB A1c - Vitamin D (25 hydroxy) - B12 and Folate Panel  7. Spinal stenosis of lumbar region with neurogenic claudication See above medical treatment plan. - CBC w/Diff/Platelet - Comprehensive Metabolic Panel (CMET) - TSH - Lipid Panel  With LDL/HDL Ratio - HgB A1c - Vitamin D (25 hydroxy) - B12 and Folate Panel  8. DDD (degenerative disc disease), lumbosacral See above medical treatment plan. - CBC w/Diff/Platelet - Comprehensive Metabolic Panel (CMET) - TSH - Lipid Panel With LDL/HDL Ratio - HgB A1c - Vitamin D (25 hydroxy) - B12 and Folate Panel  9. Elevated liver enzymes Will check labs as below and f/u pending results. - Comprehensive Metabolic Panel (CMET)  10. Colon cancer screening Due for colon cancer screen. - Ambulatory referral to Gastroenterology     Mar Daring, PA-C  [facility]

## 2019-11-25 ENCOUNTER — Telehealth: Payer: Self-pay

## 2019-11-25 ENCOUNTER — Other Ambulatory Visit: Payer: Self-pay

## 2019-11-25 DIAGNOSIS — Z1211 Encounter for screening for malignant neoplasm of colon: Secondary | ICD-10-CM

## 2019-11-25 NOTE — Telephone Encounter (Signed)
Gastroenterology Pre-Procedure Review  Request Date: Friday 12/23/19 Requesting Physician: Dr. Vicente Males  PATIENT REVIEW QUESTIONS: The patient responded to the following health history questions as indicated:    1. Are you having any GI issues? yes (blood in stool) 2. Do you have a personal history of Polyps? no 3. Do you have a family history of Colon Cancer or Polyps? no 4. Diabetes Mellitus? no 5. Joint replacements in the past 12 months?no 6. Major health problems in the past 3 months?no 7. Any artificial heart valves, MVP, or defibrillator?no    MEDICATIONS & ALLERGIES:    Patient reports the following regarding taking any anticoagulation/antiplatelet therapy:   Plavix, Coumadin, Eliquis, Xarelto, Lovenox, Pradaxa, Brilinta, or Effient? no Aspirin? no  Patient confirms/reports the following medications:  Current Outpatient Medications  Medication Sig Dispense Refill  . acetaminophen (TYLENOL) 650 MG CR tablet Take 650-1,300 mg by mouth at bedtime. As needed for arthritis.    Marland Kitchen buPROPion (WELLBUTRIN XL) 150 MG 24 hr tablet TAKE 1 TABLET BY MOUTH EVERY DAY 90 tablet 1  . Cetirizine HCl (ZYRTEC ALLERGY) 10 MG CAPS Take by mouth.    . Cholecalciferol (VITAMIN D3) 5000 units TABS Take 5,000 tablets by mouth daily.    . COLLAGEN PO Take by mouth.    . Cyanocobalamin (VITAMIN B-12) 5000 MCG SUBL Place 5,000 mcg under the tongue daily.    Marland Kitchen diltiazem (CARDIZEM CD) 240 MG 24 hr capsule TAKE 1 CAPSULE BY MOUTH EVERY DAY 90 capsule 3  . FLUoxetine (PROZAC) 20 MG capsule TAKE 3 CAPSULES BY MOUTH EVERY DAY 270 capsule 1  . losartan (COZAAR) 50 MG tablet Take 1 tablet (50 mg total) by mouth daily. 90 tablet 3  . meloxicam (MOBIC) 15 MG tablet TAKE 1 TABLET(15 MG) BY MOUTH DAILY 90 tablet 1  . metaxalone (SKELAXIN) 800 MG tablet Take 0.5-1 tablets (400-800 mg total) by mouth 3 (three) times daily as needed for muscle spasms. 90 tablet 1  . montelukast (SINGULAIR) 10 MG tablet TAKE 1 TABLET BY  MOUTH DAILY 90 tablet 3  . traMADol (ULTRAM) 50 MG tablet 1/2-1 po bid prn     No current facility-administered medications for this visit.    Patient confirms/reports the following allergies:  Allergies  Allergen Reactions  . Latex Itching  . Simvastatin     Other reaction(s): Unknown Memory changes Memory changes  . Sulfa Antibiotics     Other reaction(s): Unknown  . Levofloxacin Rash  . Prednisone Rash    No orders of the defined types were placed in this encounter.   AUTHORIZATION INFORMATION Primary Insurance: 1D#: Group #:  Secondary Insurance: 1D#: Group #:  SCHEDULE INFORMATION: Date: April 9th, 2021 Time: Location:[facility]

## 2019-11-26 LAB — LIPID PANEL WITH LDL/HDL RATIO
Cholesterol, Total: 245 mg/dL — ABNORMAL HIGH (ref 100–199)
HDL: 89 mg/dL (ref >39)
LDL Chol Calc (NIH): 147 mg/dL — ABNORMAL HIGH (ref 0–99)
LDL/HDL Ratio: 1.7 ratio (ref 0.0–3.2)
Triglycerides: 53 mg/dL (ref 0–149)
VLDL Cholesterol Cal: 9 mg/dL (ref 5–40)

## 2019-11-26 LAB — CBC WITH DIFFERENTIAL/PLATELET
Basophils Absolute: 0 10*3/uL (ref 0.0–0.2)
Basos: 1 %
EOS (ABSOLUTE): 0.3 10*3/uL (ref 0.0–0.4)
Eos: 4 %
Hematocrit: 37.3 % (ref 34.0–46.6)
Hemoglobin: 12.1 g/dL (ref 11.1–15.9)
Immature Grans (Abs): 0 10*3/uL (ref 0.0–0.1)
Immature Granulocytes: 0 %
Lymphocytes Absolute: 1.5 10*3/uL (ref 0.7–3.1)
Lymphs: 22 %
MCH: 28.1 pg (ref 26.6–33.0)
MCHC: 32.4 g/dL (ref 31.5–35.7)
MCV: 87 fL (ref 79–97)
Monocytes Absolute: 0.4 10*3/uL (ref 0.1–0.9)
Monocytes: 6 %
Neutrophils Absolute: 4.7 10*3/uL (ref 1.4–7.0)
Neutrophils: 67 %
Platelets: 295 10*3/uL (ref 150–450)
RBC: 4.31 x10E6/uL (ref 3.77–5.28)
RDW: 14.1 % (ref 11.7–15.4)
WBC: 6.9 10*3/uL (ref 3.4–10.8)

## 2019-11-26 LAB — COMPREHENSIVE METABOLIC PANEL
ALT: 12 IU/L (ref 0–32)
AST: 13 IU/L (ref 0–40)
Albumin/Globulin Ratio: 1.3 (ref 1.2–2.2)
Albumin: 4.1 g/dL (ref 3.8–4.9)
Alkaline Phosphatase: 126 IU/L — ABNORMAL HIGH (ref 39–117)
BUN/Creatinine Ratio: 17 (ref 9–23)
BUN: 19 mg/dL (ref 6–24)
Bilirubin Total: 0.2 mg/dL (ref 0.0–1.2)
CO2: 22 mmol/L (ref 20–29)
Calcium: 9.6 mg/dL (ref 8.7–10.2)
Chloride: 104 mmol/L (ref 96–106)
Creatinine, Ser: 1.15 mg/dL — ABNORMAL HIGH (ref 0.57–1.00)
GFR calc Af Amer: 61 mL/min/{1.73_m2} (ref >59)
GFR calc non Af Amer: 53 mL/min/{1.73_m2} — ABNORMAL LOW (ref >59)
Globulin, Total: 3.1 g/dL (ref 1.5–4.5)
Glucose: 92 mg/dL (ref 65–99)
Potassium: 4.7 mmol/L (ref 3.5–5.2)
Sodium: 138 mmol/L (ref 134–144)
Total Protein: 7.2 g/dL (ref 6.0–8.5)

## 2019-11-26 LAB — HEMOGLOBIN A1C
Est. average glucose Bld gHb Est-mCnc: 117 mg/dL
Hgb A1c MFr Bld: 5.7 % — ABNORMAL HIGH (ref 4.8–5.6)

## 2019-11-26 LAB — B12 AND FOLATE PANEL
Folate: 4.8 ng/mL (ref >3.0)
Vitamin B-12: 1907 pg/mL — ABNORMAL HIGH (ref 232–1245)

## 2019-11-26 LAB — TSH: TSH: 1.95 u[IU]/mL (ref 0.450–4.500)

## 2019-11-26 LAB — VITAMIN D 25 HYDROXY (VIT D DEFICIENCY, FRACTURES): Vit D, 25-Hydroxy: 40.6 ng/mL (ref 30.0–100.0)

## 2019-11-28 ENCOUNTER — Telehealth: Payer: Self-pay

## 2019-11-28 NOTE — Telephone Encounter (Signed)
LMTCB If patient returns call OK for [facility] to give results

## 2019-11-28 NOTE — Telephone Encounter (Signed)
-----   Message from Mar Daring, PA-C sent at 11/28/2019  7:55 AM EDT ----- Blood count is normal. Kidney function improved compared to last check. Liver enzymes normal. Sodium, potassium and calcium are normal. Thyroid is normal. Cholesterol is still borderline elevated but essentially stable. A1c is down to 5.7 from 6.0. Vit D is normal. B12 is elevated. Can decrease B12 supplementation. Folate is normal.

## 2019-11-30 ENCOUNTER — Other Ambulatory Visit: Payer: Self-pay | Admitting: Physician Assistant

## 2019-11-30 DIAGNOSIS — M5431 Sciatica, right side: Secondary | ICD-10-CM

## 2019-11-30 DIAGNOSIS — M5432 Sciatica, left side: Secondary | ICD-10-CM

## 2019-11-30 NOTE — Telephone Encounter (Signed)
Requested medication (s) are due for refill today - yes  Requested medication (s) are on the active medication list -yes  Future visit scheduled -yes-in 2 days  Last refill: 08/31/20  Notes to clinic: patient is requesting refill of medication that fails lab protocol- sent for PCP review   Requested Prescriptions  Pending Prescriptions Disp Refills   meloxicam (MOBIC) 15 MG tablet [Pharmacy Med Name: MELOXICAM 15MG  TABLETS] 90 tablet 1    Sig: TAKE 1 TABLET(15 MG) BY MOUTH DAILY      Analgesics:  COX2 Inhibitors Failed - 11/30/2019 12:16 PM      Failed - Cr in normal range and within 360 days    Creatinine, Ser  Date Value Ref Range Status  11/25/2019 1.15 (H) 0.57 - 1.00 mg/dL Final          Passed - HGB in normal range and within 360 days    Hemoglobin  Date Value Ref Range Status  11/25/2019 12.1 11.1 - 15.9 g/dL Final          Passed - Patient is not pregnant      Passed - Valid encounter within last 12 months    Recent Outpatient Visits           1 week ago Mild episode of recurrent major depressive disorder [facility])   [employer] [place], Clearnce Sorrel, PA-C   1 month ago [place], Wendee Beavers, [place]   1 year ago Bilateral sciatica   [place], Clearnce Sorrel, [place]   1 year ago Annual physical exam   [place], Clearnce Sorrel, [place]   1 year ago Bilateral sciatica   [place], [place], [place]       Future Appointments             In 2 days Marlyn Corporal, Clearnce Sorrel, PA-C [employer], [place]                Requested Prescriptions  Pending Prescriptions Disp Refills   meloxicam ([place]) 15 MG tablet [Pharmacy Med Name: MELOXICAM 15MG  TABLETS] 90 tablet 1    Sig: TAKE 1 TABLET(15 MG) BY MOUTH DAILY      Analgesics:  COX2 Inhibitors Failed - 11/30/2019 12:16 PM      Failed - Cr in normal range and within 360 days    Creatinine,  Ser  Date Value Ref Range Status  11/25/2019 1.15 (H) 0.57 - 1.00 mg/dL Final          Passed - HGB in normal range and within 360 days    Hemoglobin  Date Value Ref Range Status  11/25/2019 12.1 11.1 - 15.9 g/dL Final          Passed - Patient is not pregnant      Passed - Valid encounter within last 12 months    Recent Outpatient Visits           1 week ago Mild episode of recurrent major depressive disorder [facility])   [place], [place], [place]   1 month ago [place], Wendee Beavers, [place]   1 year ago Bilateral sciatica   [place], Clearnce Sorrel, [place]   1 year ago Annual physical exam   [place], Clearnce Sorrel, [place]   1 year ago Bilateral sciatica   [place], [place], [place]  Future Appointments             In 2 days [place], Clearnce Sorrel, PA-C [facility], [facility]

## 2019-11-30 NOTE — Telephone Encounter (Signed)
LMTCB 11/30/2019.  [facility] please advise pt of lab results below.   Thanks,   -Mickel Baas

## 2019-12-01 ENCOUNTER — Ambulatory Visit: Payer: Managed Care, Other (non HMO) | Attending: Internal Medicine

## 2019-12-01 DIAGNOSIS — Z23 Encounter for immunization: Secondary | ICD-10-CM

## 2019-12-01 NOTE — Telephone Encounter (Signed)
Call to patient - left message to call back regarding lab results.

## 2019-12-01 NOTE — Progress Notes (Signed)
Patient: Ashley Mccullough Female    DOB: 06-May-1961   59 y.o.   MRN: [id number] Visit Date: 12/02/2019  Today's Provider: Mar Daring, PA-C   Chief Complaint  Patient presents with  . Follow-up    Wound   Subjective:     HPI  Patient here to follow-up on her wound. Located under her right breast, upper abdomen. Reports is a little better. Continues to keep clean and dry. Applies dressing for drainage. No redness, no purulent discharge, no warmth.    Allergies  Allergen Reactions  . Latex Itching  . Simvastatin     Other reaction(s): Unknown Memory changes Memory changes  . Sulfa Antibiotics     Other reaction(s): Unknown  . Levofloxacin Rash  . Prednisone Rash     Current Outpatient Medications:  .  acetaminophen (TYLENOL) 650 MG CR tablet, Take 650-1,300 mg by mouth at bedtime. As needed for arthritis., Disp: , Rfl:  .  buPROPion (WELLBUTRIN XL) 150 MG 24 hr tablet, TAKE 1 TABLET BY MOUTH EVERY DAY, Disp: 90 tablet, Rfl: 1 .  Cetirizine HCl (ZYRTEC ALLERGY) 10 MG CAPS, Take by mouth., Disp: , Rfl:  .  Cholecalciferol (VITAMIN D3) 5000 units TABS, Take 5,000 tablets by mouth daily., Disp: , Rfl:  .  COLLAGEN PO, Take by mouth., Disp: , Rfl:  .  Cyanocobalamin (VITAMIN B-12) 5000 MCG SUBL, Place 5,000 mcg under the tongue daily., Disp: , Rfl:  .  diltiazem (CARDIZEM CD) 240 MG 24 hr capsule, TAKE 1 CAPSULE BY MOUTH EVERY DAY, Disp: 90 capsule, Rfl: 3 .  FLUoxetine (PROZAC) 20 MG capsule, TAKE 3 CAPSULES BY MOUTH EVERY DAY, Disp: 270 capsule, Rfl: 1 .  losartan (COZAAR) 50 MG tablet, Take 1 tablet (50 mg total) by mouth daily., Disp: 90 tablet, Rfl: 3 .  meloxicam (MOBIC) 15 MG tablet, TAKE 1 TABLET(15 MG) BY MOUTH DAILY, Disp: 90 tablet, Rfl: 1 .  metaxalone (SKELAXIN) 800 MG tablet, Take 0.5-1 tablets (400-800 mg total) by mouth 3 (three) times daily as needed for muscle spasms., Disp: 90 tablet, Rfl: 1 .  montelukast (SINGULAIR) 10 MG tablet, TAKE 1 TABLET BY  MOUTH DAILY, Disp: 90 tablet, Rfl: 3 .  traMADol (ULTRAM) 50 MG tablet, 1/2-1 po bid prn, Disp: , Rfl:   Review of Systems  Constitutional: Negative.   Respiratory: Negative.   Cardiovascular: Negative.   Gastrointestinal: Negative.   Skin: Positive for wound. Negative for color change.    Social History   Tobacco Use  . Smoking status: Never Smoker  . Smokeless tobacco: Never Used  Substance Use Topics  . Alcohol use: Not Currently      Objective:   BP (!) 166/96 (BP Location: Left Wrist, Patient Position: Sitting, Cuff Size: Large)   Pulse 76   Temp (!) 97.1 F (36.2 C) (Temporal)   Resp 16   Wt (!) 322 lb 9.6 oz (146.3 kg)   BMI 59.00 kg/m  Vitals:   12/02/19 1712  BP: (!) 166/96  Pulse: 76  Resp: 16  Temp: (!) 97.1 F (36.2 C)  TempSrc: Temporal  Weight: (!) 322 lb 9.6 oz (146.3 kg)  Body mass index is 59 kg/m.   Physical Exam Vitals reviewed.  Constitutional:      General: She is not in acute distress.    Appearance: Normal appearance. She is well-developed. She is obese. She is not diaphoretic.  Cardiovascular:     Rate and Rhythm: Normal rate and regular rhythm.  Heart sounds: Normal heart sounds. No murmur. No friction rub. No gallop.   Pulmonary:     Effort: Pulmonary effort is normal. No respiratory distress.     Breath sounds: Normal breath sounds. No wheezing or rales.  Musculoskeletal:     Cervical back: Normal range of motion and neck supple.  Skin:    Comments: See photo below  Neurological:     Mental Status: She is alert.         No results found for any visits on 12/02/19.     Assessment & Plan    1. Wound of abdomen Decreased in size by 20mm. Now measuring 40mm instead of 58mm. Continues to have good, pink granulating tissue. No fibrin or necrotic tissue noted. Continue wound care. Return in 2 weeks.   2. Essential hypertension BP elevated today. Advised to monitor at home and will discuss and recheck in 2 weeks. Continue  Diltiazem 240mg  XR daily and Losartan 50mg .      Mar Daring, PA-C  [facility]

## 2019-12-01 NOTE — Progress Notes (Signed)
   Covid-19 Vaccination Clinic  Name:  Ashley Mccullough    MRN: [id number] DOB: Jan 04, 1961  12/01/2019  [name] was observed post Covid-19 immunization for 30 minutes based on pre-vaccination screening without incident. She was provided with Vaccine Information Sheet and instruction to access the V-Safe system.   [name] was instructed to call 911 with any severe reactions post vaccine: Marland Kitchen Difficulty breathing  . Swelling of face and throat  . A fast heartbeat  . A bad rash all over body  . Dizziness and weakness   Immunizations Administered    Name Date Dose VIS Date Route   Pfizer COVID-19 Vaccine 12/01/2019  8:46 AM 0.3 mL 08/26/2019 Intramuscular   Manufacturer: [place]   Lot: [id number]   [place]: [id number]

## 2019-12-02 ENCOUNTER — Encounter: Payer: Self-pay | Admitting: Physician Assistant

## 2019-12-02 ENCOUNTER — Other Ambulatory Visit: Payer: Self-pay

## 2019-12-02 ENCOUNTER — Ambulatory Visit: Payer: Managed Care, Other (non HMO) | Admitting: Physician Assistant

## 2019-12-02 VITALS — BP 166/96 | HR 76 | Temp 97.1°F | Resp 16 | Wt 322.6 lb

## 2019-12-02 DIAGNOSIS — I10 Essential (primary) hypertension: Secondary | ICD-10-CM

## 2019-12-02 DIAGNOSIS — S31109A Unspecified open wound of abdominal wall, unspecified quadrant without penetration into peritoneal cavity, initial encounter: Secondary | ICD-10-CM

## 2019-12-02 NOTE — Telephone Encounter (Signed)
Rec'd call from pt. to review lab results.  Advised of result note from [facility].  Pt. Verb. Understanding.

## 2019-12-05 ENCOUNTER — Encounter: Payer: Self-pay | Admitting: Physician Assistant

## 2019-12-15 NOTE — Progress Notes (Signed)
Patient: Ashley Mccullough Female    DOB: 03-Jul-1961   59 y.o.   MRN: [id number] Visit Date: 12/16/2019  Today's Provider: Mar Daring, PA-C   Chief Complaint  Patient presents with  . Follow-up    Wound and HTN   Subjective:     HPI  Wound of abdomen: Decreased in size by 69mm. Now measuring 82mm instead of 50mm. Continues to have good, pink granulating tissue. No fibrin or necrotic tissue noted. Continue wound care. She reports the wound is getting smaller and is looking better.  Essential hypertension: Advised to monitor at home. Continue Diltiazem 240mg  XR daily and Losartan 50mg . Patient didn't check her blood pressure at home.  BP Readings from Last 3 Encounters:  12/16/19 (!) 153/95  12/02/19 (!) 166/96  11/18/19 (!) 157/95    Allergies  Allergen Reactions  . Latex Itching  . Simvastatin     Other reaction(s): Unknown Memory changes Memory changes  . Sulfa Antibiotics     Other reaction(s): Unknown  . Levofloxacin Rash  . Prednisone Rash     Current Outpatient Medications:  .  acetaminophen (TYLENOL) 650 MG CR tablet, Take 650-1,300 mg by mouth at bedtime. As needed for arthritis., Disp: , Rfl:  .  buPROPion (WELLBUTRIN XL) 150 MG 24 hr tablet, TAKE 1 TABLET BY MOUTH EVERY DAY, Disp: 90 tablet, Rfl: 1 .  Cetirizine HCl (ZYRTEC ALLERGY) 10 MG CAPS, Take by mouth., Disp: , Rfl:  .  Cholecalciferol (VITAMIN D3) 5000 units TABS, Take 5,000 tablets by mouth daily., Disp: , Rfl:  .  COLLAGEN PO, Take by mouth., Disp: , Rfl:  .  Cyanocobalamin (VITAMIN B-12) 5000 MCG SUBL, Place 5,000 mcg under the tongue daily., Disp: , Rfl:  .  diltiazem (CARDIZEM CD) 240 MG 24 hr capsule, TAKE 1 CAPSULE BY MOUTH EVERY DAY, Disp: 90 capsule, Rfl: 3 .  FLUoxetine (PROZAC) 20 MG capsule, TAKE 3 CAPSULES BY MOUTH EVERY DAY, Disp: 270 capsule, Rfl: 1 .  losartan (COZAAR) 50 MG tablet, Take 1 tablet (50 mg total) by mouth daily., Disp: 90 tablet, Rfl: 3 .  meloxicam (MOBIC)  15 MG tablet, TAKE 1 TABLET(15 MG) BY MOUTH DAILY, Disp: 90 tablet, Rfl: 1 .  metaxalone (SKELAXIN) 800 MG tablet, Take 0.5-1 tablets (400-800 mg total) by mouth 3 (three) times daily as needed for muscle spasms., Disp: 90 tablet, Rfl: 1 .  montelukast (SINGULAIR) 10 MG tablet, TAKE 1 TABLET BY MOUTH DAILY, Disp: 90 tablet, Rfl: 3 .  traMADol (ULTRAM) 50 MG tablet, 1/2-1 po bid prn, Disp: , Rfl:   Review of Systems  Constitutional: Negative.   Eyes: Negative for visual disturbance.  Respiratory: Negative.   Cardiovascular: Negative.   Skin: Positive for wound.  Neurological: Negative.     Social History   Tobacco Use  . Smoking status: Never Smoker  . Smokeless tobacco: Never Used  Substance Use Topics  . Alcohol use: Not Currently      Objective:   BP (!) 153/95 (BP Location: Right Wrist, Patient Position: Sitting, Cuff Size: Large)   Pulse 85   Temp (!) 96.9 F (36.1 C) (Temporal)   Resp 16   Wt (!) 320 lb 9.6 oz (145.4 kg)   BMI 58.64 kg/m  Vitals:   12/16/19 1704  BP: (!) 153/95  Pulse: 85  Resp: 16  Temp: (!) 96.9 F (36.1 C)  TempSrc: Temporal  Weight: (!) 320 lb 9.6 oz (145.4 kg)  Body  mass index is 58.64 kg/m.   Physical Exam Vitals reviewed.  Constitutional:      Appearance: Normal appearance. She is well-developed. She is obese.  HENT:     Head: Normocephalic and atraumatic.  Pulmonary:     Effort: Pulmonary effort is normal. No respiratory distress.  Musculoskeletal:     Cervical back: Normal range of motion and neck supple.  Skin:    Comments: See picture below  Neurological:     Mental Status: She is alert.  Psychiatric:        Mood and Affect: Mood normal.        Behavior: Behavior normal.        Thought Content: Thought content normal.        Judgment: Judgment normal.        No results found for any visits on 12/16/19.     Assessment & Plan    1. Essential hypertension Elevated. Will increase Losartan to 100mg  (may take 2 of  her 50mg  until she runs out). Continue Diltiazem 240mg . Return in 4 weeks.  - losartan (COZAAR) 100 MG tablet; Take 1 tablet (100 mg total) by mouth daily.  Dispense: 90 tablet; Refill: 3  2. Wound of abdomen Continues to improve. Recheck in 4 weeks unless worsening.     Mar Daring, PA-C  [facility]

## 2019-12-16 ENCOUNTER — Other Ambulatory Visit: Payer: Self-pay

## 2019-12-16 ENCOUNTER — Ambulatory Visit: Payer: Managed Care, Other (non HMO) | Admitting: Physician Assistant

## 2019-12-16 ENCOUNTER — Encounter: Payer: Self-pay | Admitting: Physician Assistant

## 2019-12-16 VITALS — BP 153/95 | HR 85 | Temp 96.9°F | Resp 16 | Wt 320.6 lb

## 2019-12-16 DIAGNOSIS — S31109A Unspecified open wound of abdominal wall, unspecified quadrant without penetration into peritoneal cavity, initial encounter: Secondary | ICD-10-CM | POA: Diagnosis not present

## 2019-12-16 DIAGNOSIS — I10 Essential (primary) hypertension: Secondary | ICD-10-CM

## 2019-12-16 MED ORDER — LOSARTAN POTASSIUM 100 MG PO TABS: 100.0000 mg | ORAL_TABLET | Freq: Every day | ORAL | 3 refills | Status: DC

## 2019-12-16 NOTE — Patient Instructions (Signed)
DASH Eating Plan DASH stands for "Dietary Approaches to Stop Hypertension." The DASH eating plan is a healthy eating plan that has been shown to reduce high blood pressure (hypertension). It may also reduce your risk for type 2 diabetes, heart disease, and stroke. The DASH eating plan may also help with weight loss. What are tips for following this plan?  General guidelines  Avoid eating more than 2,300 mg (milligrams) of salt (sodium) a day. If you have hypertension, you may need to reduce your sodium intake to 1,500 mg a day.  Limit alcohol intake to no more than 1 drink a day for nonpregnant women and 2 drinks a day for men. One drink equals 12 oz of beer, 5 oz of wine, or 1 oz of hard liquor.  Work with your health care provider to maintain a healthy body weight or to lose weight. Ask what an ideal weight is for you.  Get at least 30 minutes of exercise that causes your heart to beat faster (aerobic exercise) most days of the week. Activities may include walking, swimming, or biking.  Work with your health care provider or diet and nutrition specialist (dietitian) to adjust your eating plan to your individual calorie needs. Reading food labels   Check food labels for the amount of sodium per serving. Choose foods with less than 5 percent of the Daily Value of sodium. Generally, foods with less than 300 mg of sodium per serving fit into this eating plan.  To find whole grains, look for the word "whole" as the first word in the ingredient list. Shopping  Buy products labeled as "low-sodium" or "no salt added."  Buy fresh foods. Avoid canned foods and premade or frozen meals. Cooking  Avoid adding salt when cooking. Use salt-free seasonings or herbs instead of table salt or sea salt. Check with your health care provider or pharmacist before using salt substitutes.  Do not fry foods. Cook foods using healthy methods such as baking, boiling, grilling, and broiling instead.  Cook with  heart-healthy oils, such as olive, canola, soybean, or sunflower oil. Meal planning  Eat a balanced diet that includes: ? 5 or more servings of fruits and vegetables each day. At each meal, try to fill half of your plate with fruits and vegetables. ? Up to 6-8 servings of whole grains each day. ? Less than 6 oz of lean meat, poultry, or fish each day. A 3-oz serving of meat is about the same size as a deck of cards. One egg equals 1 oz. ? 2 servings of low-fat dairy each day. ? A serving of nuts, seeds, or beans 5 times each week. ? Heart-healthy fats. Healthy fats called Omega-3 fatty acids are found in foods such as flaxseeds and coldwater fish, like sardines, salmon, and mackerel.  Limit how much you eat of the following: ? Canned or prepackaged foods. ? Food that is high in trans fat, such as fried foods. ? Food that is high in saturated fat, such as fatty meat. ? Sweets, desserts, sugary drinks, and other foods with added sugar. ? Full-fat dairy products.  Do not salt foods before eating.  Try to eat at least 2 vegetarian meals each week.  Eat more home-cooked food and less restaurant, buffet, and fast food.  When eating at a restaurant, ask that your food be prepared with less salt or no salt, if possible. What foods are recommended? The items listed may not be a complete list. Talk with your dietitian about   what dietary choices are best for you. Grains Whole-grain or whole-wheat bread. Whole-grain or whole-wheat pasta. Brown rice. Oatmeal. Quinoa. Bulgur. Whole-grain and low-sodium cereals. Pita bread. Low-fat, low-sodium crackers. Whole-wheat flour tortillas. Vegetables Fresh or frozen vegetables (raw, steamed, roasted, or grilled). Low-sodium or reduced-sodium tomato and vegetable juice. Low-sodium or reduced-sodium tomato sauce and tomato paste. Low-sodium or reduced-sodium canned vegetables. Fruits All fresh, dried, or frozen fruit. Canned fruit in natural juice (without  added sugar). Meat and other protein foods Skinless chicken or turkey. Ground chicken or turkey. Pork with fat trimmed off. Fish and seafood. Egg whites. Dried beans, peas, or lentils. Unsalted nuts, nut butters, and seeds. Unsalted canned beans. Lean cuts of beef with fat trimmed off. Low-sodium, lean deli meat. Dairy Low-fat (1%) or fat-free (skim) milk. Fat-free, low-fat, or reduced-fat cheeses. Nonfat, low-sodium ricotta or cottage cheese. Low-fat or nonfat yogurt. Low-fat, low-sodium cheese. Fats and oils Soft margarine without trans fats. Vegetable oil. Low-fat, reduced-fat, or light mayonnaise and salad dressings (reduced-sodium). Canola, safflower, olive, soybean, and sunflower oils. Avocado. Seasoning and other foods Herbs. Spices. Seasoning mixes without salt. Unsalted popcorn and pretzels. Fat-free sweets. What foods are not recommended? The items listed may not be a complete list. Talk with your dietitian about what dietary choices are best for you. Grains Baked goods made with fat, such as croissants, muffins, or some breads. Dry pasta or rice meal packs. Vegetables Creamed or fried vegetables. Vegetables in a cheese sauce. Regular canned vegetables (not low-sodium or reduced-sodium). Regular canned tomato sauce and paste (not low-sodium or reduced-sodium). Regular tomato and vegetable juice (not low-sodium or reduced-sodium). Pickles. Olives. Fruits Canned fruit in a light or heavy syrup. Fried fruit. Fruit in cream or butter sauce. Meat and other protein foods Fatty cuts of meat. Ribs. Fried meat. Bacon. Sausage. Bologna and other processed lunch meats. Salami. Fatback. Hotdogs. Bratwurst. Salted nuts and seeds. Canned beans with added salt. Canned or smoked fish. Whole eggs or egg yolks. Chicken or turkey with skin. Dairy Whole or 2% milk, cream, and half-and-half. Whole or full-fat cream cheese. Whole-fat or sweetened yogurt. Full-fat cheese. Nondairy creamers. Whipped toppings.  Processed cheese and cheese spreads. Fats and oils Butter. Stick margarine. Lard. Shortening. Ghee. Bacon fat. Tropical oils, such as coconut, palm kernel, or palm oil. Seasoning and other foods Salted popcorn and pretzels. Onion salt, garlic salt, seasoned salt, table salt, and sea salt. Worcestershire sauce. Tartar sauce. Barbecue sauce. Teriyaki sauce. Soy sauce, including reduced-sodium. Steak sauce. Canned and packaged gravies. Fish sauce. Oyster sauce. Cocktail sauce. Horseradish that you find on the shelf. Ketchup. Mustard. Meat flavorings and tenderizers. Bouillon cubes. Hot sauce and Tabasco sauce. Premade or packaged marinades. Premade or packaged taco seasonings. Relishes. Regular salad dressings. Where to find more information:  National Heart, Lung, and Blood Institute: www.nhlbi.nih.gov  American Heart Association: www.heart.org Summary  The DASH eating plan is a healthy eating plan that has been shown to reduce high blood pressure (hypertension). It may also reduce your risk for type 2 diabetes, heart disease, and stroke.  With the DASH eating plan, you should limit salt (sodium) intake to 2,300 mg a day. If you have hypertension, you may need to reduce your sodium intake to 1,500 mg a day.  When on the DASH eating plan, aim to eat more fresh fruits and vegetables, whole grains, lean proteins, low-fat dairy, and heart-healthy fats.  Work with your health care provider or diet and nutrition specialist (dietitian) to adjust your eating plan to your   individual calorie needs. This information is not intended to replace advice given to you by your health care provider. Make sure you discuss any questions you have with your health care provider. Document Revised: 08/14/2017 Document Reviewed: 08/25/2016 Elsevier Patient Education  2020 [employer]  

## 2019-12-21 ENCOUNTER — Other Ambulatory Visit
Admission: RE | Admit: 2019-12-21 | Discharge: 2019-12-21 | Disposition: A | Discharge status: Home / Self Care | Payer: Managed Care, Other (non HMO) | Source: Ambulatory Visit | Attending: Gastroenterology | Admitting: Gastroenterology

## 2019-12-21 DIAGNOSIS — Z01812 Encounter for preprocedural laboratory examination: Secondary | ICD-10-CM | POA: Diagnosis not present

## 2019-12-21 DIAGNOSIS — Z20822 Contact with and (suspected) exposure to covid-19: Secondary | ICD-10-CM | POA: Insufficient documentation

## 2019-12-21 LAB — SARS CORONAVIRUS 2 (TAT 6-24 HRS): SARS Coronavirus 2: NEGATIVE

## 2019-12-23 ENCOUNTER — Ambulatory Visit
Admission: RE | Admit: 2019-12-23 | Discharge: 2019-12-23 | Disposition: A | Discharge status: Home / Self Care | Payer: Managed Care, Other (non HMO) | Source: Ambulatory Visit | Attending: Gastroenterology | Admitting: Gastroenterology

## 2019-12-23 ENCOUNTER — Other Ambulatory Visit: Payer: Self-pay

## 2019-12-23 ENCOUNTER — Ambulatory Visit: Payer: Managed Care, Other (non HMO) | Admitting: Certified Registered Nurse Anesthetist

## 2019-12-23 ENCOUNTER — Encounter: Payer: Self-pay | Admitting: Gastroenterology

## 2019-12-23 ENCOUNTER — Encounter
Admission: RE | Disposition: A | Discharge status: Home / Self Care | Payer: Self-pay | Source: Ambulatory Visit | Attending: Gastroenterology

## 2019-12-23 DIAGNOSIS — E785 Hyperlipidemia, unspecified: Secondary | ICD-10-CM | POA: Insufficient documentation

## 2019-12-23 DIAGNOSIS — Z791 Long term (current) use of non-steroidal anti-inflammatories (NSAID): Secondary | ICD-10-CM | POA: Insufficient documentation

## 2019-12-23 DIAGNOSIS — Z881 Allergy status to other antibiotic agents status: Secondary | ICD-10-CM | POA: Diagnosis not present

## 2019-12-23 DIAGNOSIS — Z79891 Long term (current) use of opiate analgesic: Secondary | ICD-10-CM | POA: Diagnosis not present

## 2019-12-23 DIAGNOSIS — Z79899 Other long term (current) drug therapy: Secondary | ICD-10-CM | POA: Diagnosis not present

## 2019-12-23 DIAGNOSIS — K640 First degree hemorrhoids: Secondary | ICD-10-CM | POA: Insufficient documentation

## 2019-12-23 DIAGNOSIS — F329 Major depressive disorder, single episode, unspecified: Secondary | ICD-10-CM | POA: Insufficient documentation

## 2019-12-23 DIAGNOSIS — Z888 Allergy status to other drugs, medicaments and biological substances status: Secondary | ICD-10-CM | POA: Insufficient documentation

## 2019-12-23 DIAGNOSIS — M1990 Unspecified osteoarthritis, unspecified site: Secondary | ICD-10-CM | POA: Diagnosis not present

## 2019-12-23 DIAGNOSIS — K5730 Diverticulosis of large intestine without perforation or abscess without bleeding: Secondary | ICD-10-CM | POA: Insufficient documentation

## 2019-12-23 DIAGNOSIS — Z1211 Encounter for screening for malignant neoplasm of colon: Secondary | ICD-10-CM | POA: Diagnosis not present

## 2019-12-23 DIAGNOSIS — Z6841 Body Mass Index (BMI) 40.0 and over, adult: Secondary | ICD-10-CM | POA: Insufficient documentation

## 2019-12-23 DIAGNOSIS — F419 Anxiety disorder, unspecified: Secondary | ICD-10-CM | POA: Diagnosis not present

## 2019-12-23 DIAGNOSIS — I10 Essential (primary) hypertension: Secondary | ICD-10-CM | POA: Insufficient documentation

## 2019-12-23 HISTORY — PX: COLONOSCOPY WITH PROPOFOL: SHX5780

## 2019-12-23 SURGERY — COLONOSCOPY WITH PROPOFOL
Anesthesia: General

## 2019-12-23 MED ORDER — PROPOFOL 500 MG/50ML IV EMUL
INTRAVENOUS | Status: DC | PRN
  Administered 2019-12-23: 100 ug/kg/min via INTRAVENOUS
  Administered 2019-12-23: 30 mg via INTRAVENOUS

## 2019-12-23 MED ORDER — DEXMEDETOMIDINE HCL IN NACL 200 MCG/50ML IV SOLN
INTRAVENOUS | Status: DC | PRN
  Administered 2019-12-23: 8 ug via INTRAVENOUS

## 2019-12-23 MED ORDER — DEXAMETHASONE SODIUM PHOSPHATE 10 MG/ML IJ SOLN
INTRAMUSCULAR | Status: AC
  Filled 2019-12-23: qty 1

## 2019-12-23 MED ORDER — LIDOCAINE HCL (CARDIAC) PF 100 MG/5ML IV SOSY
PREFILLED_SYRINGE | INTRAVENOUS | Status: DC | PRN
  Administered 2019-12-23: 60 mg via INTRAVENOUS

## 2019-12-23 MED ORDER — PROPOFOL 10 MG/ML IV BOLUS
INTRAVENOUS | Status: AC
  Filled 2019-12-23: qty 40

## 2019-12-23 MED ORDER — GLYCOPYRROLATE 0.2 MG/ML IJ SOLN
INTRAMUSCULAR | Status: DC | PRN
  Administered 2019-12-23: .2 mg via INTRAVENOUS

## 2019-12-23 MED ORDER — SODIUM CHLORIDE 0.9 % IV SOLN: INTRAVENOUS | Status: DC

## 2019-12-23 MED ORDER — ONDANSETRON HCL 4 MG/2ML IJ SOLN: 4.0000 mg | Freq: Once | INTRAMUSCULAR | Status: DC | PRN

## 2019-12-23 MED ORDER — FENTANYL CITRATE (PF) 100 MCG/2ML IJ SOLN: 25.0000 ug | INTRAMUSCULAR | Status: DC | PRN

## 2019-12-23 NOTE — Op Note (Signed)
[facility] Patient Name: Ashley Mccullough Procedure Date: 12/23/2019 7:28 AM MRN: [id number] Account #: [id number] Date of Birth: 1961-08-06 Admit Type: Outpatient Age: 59 Room: [facility] ROOM 3 Gender: Female Note Status: Finalized Procedure:             Colonoscopy Indications:           Screening for colorectal malignant neoplasm Providers:             Jonathon Bellows MD, MD Referring MD:          Mar Daring (Referring MD) Medicines:             Monitored Anesthesia Care Complications:         No immediate complications. Procedure:             Pre-Anesthesia Assessment:                        - Prior to the procedure, a History and Physical was                         performed, and patient medications, allergies and                         sensitivities were reviewed. The patient's tolerance                         of previous anesthesia was reviewed.                        - The risks and benefits of the procedure and the                         sedation options and risks were discussed with the                         patient. All questions were answered and informed                         consent was obtained.                        - ASA Grade Assessment: III - A patient with severe                         systemic disease.                        After obtaining informed consent, the colonoscope was                         passed under direct vision. Throughout the procedure,                         the patient's blood pressure, pulse, and oxygen                         saturations were monitored continuously. The                         Colonoscope was introduced through the anus and  advanced to the the cecum, identified by the                         appendiceal orifice. The colonoscopy was performed                         with ease. The patient tolerated the procedure well.                         The quality  of the bowel preparation was excellent. Findings:      The perianal and digital rectal examinations were normal.      Non-bleeding internal hemorrhoids were found during retroflexion. The       hemorrhoids were large and Grade I (internal hemorrhoids that do not       prolapse).      Multiple medium-mouthed diverticula were found in the sigmoid colon.      The exam was otherwise without abnormality on direct and retroflexion       views. Impression:            - Non-bleeding internal hemorrhoids.                        - Diverticulosis in the sigmoid colon.                        - The examination was otherwise normal on direct and                         retroflexion views.                        - No specimens collected. Recommendation:        - Discharge patient to home (with escort).                        - Resume previous diet.                        - Continue present medications.                        - Repeat colonoscopy in 10 years for screening                         purposes. Procedure Code(s):     --- Professional ---                        [phone], Colonoscopy, flexible; diagnostic, including                         collection of specimen(s) by brushing or washing, when                         performed (separate procedure) Diagnosis Code(s):     --- Professional ---                        Z12.11, Encounter for screening for malignant neoplasm  of colon                        K64.0, First degree hemorrhoids                        K57.30, Diverticulosis of large intestine without                         perforation or abscess without bleeding CPT copyright 2019 American Medical Association. All rights reserved. The codes documented in this report are preliminary and upon coder review may  be revised to meet current compliance requirements. Jonathon Bellows, MD Jonathon Bellows MD, MD 12/23/2019 8:45:57 AM This report has been signed electronically. Number of  Addenda: 0 Note Initiated On: 12/23/2019 7:28 AM Scope Withdrawal Time: 0 hours 13 minutes 12 seconds  Total Procedure Duration: 0 hours 14 minutes 53 seconds  Estimated Blood Loss:  Estimated blood loss: none.      [facility]

## 2019-12-23 NOTE — Transfer of Care (Signed)
Immediate Anesthesia Transfer of Care Note  Patient: Ashley Mccullough  Procedure(s) Performed: COLONOSCOPY WITH PROPOFOL (N/A )  Patient Location: PACU  Anesthesia Type:General  Level of Consciousness: awake  Airway & Oxygen Therapy: Patient Spontanous Breathing  Post-op Assessment: Report given to RN  Post vital signs: Reviewed  Last Vitals:  Vitals Value Taken Time  BP 124/64 12/23/19 0852  Temp    Pulse 76 12/23/19 0852  Resp 21 12/23/19 0852  SpO2 97 % 12/23/19 0852    Last Pain:  Vitals:   12/23/19 0850  TempSrc:   PainSc: 0-No pain         Complications: No apparent anesthesia complications

## 2019-12-23 NOTE — Anesthesia Postprocedure Evaluation (Signed)
Anesthesia Post Note  Patient: Ashley Mccullough  Procedure(s) Performed: COLONOSCOPY WITH PROPOFOL (N/A )  Patient location during evaluation: Endoscopy Anesthesia Type: General Level of consciousness: awake and alert and oriented Pain management: pain level controlled Vital Signs Assessment: post-procedure vital signs reviewed and stable Respiratory status: spontaneous breathing Cardiovascular status: blood pressure returned to baseline Anesthetic complications: no     Last Vitals:  Vitals:   12/23/19 0900 12/23/19 0920  BP:    Pulse:    Resp: 17 20  Temp:    SpO2:      Last Pain:  Vitals:   12/23/19 0920  TempSrc:   PainSc: 0-No pain                 Alarik Radu

## 2019-12-23 NOTE — H&P (Signed)
Jonathon Bellows, MD [address] [place], [place], [place], [address] [address], [place], [place], [place], [address] Phone: [phone]  Fax: [phone]  Primary Care Physician:  Mar Daring, PA-C   Pre-Procedure History & Physical: HPI:  Kessa Fairbairn is a 59 y.o. female is here for an colonoscopy.   Past Medical History:  Diagnosis Date  . Allergy   . Anxiety   . Depression   . Hyperlipidemia   . Hypertension     Past Surgical History:  Procedure Laterality Date  . ABDOMINAL HYSTERECTOMY     still has ovaries    Prior to Admission medications   Medication Sig Start Date End Date Taking? Authorizing Provider  buPROPion (WELLBUTRIN XL) 150 MG 24 hr tablet TAKE 1 TABLET BY MOUTH EVERY DAY 10/21/19  Yes Fenton Malling M, PA-C  Cetirizine HCl (ZYRTEC ALLERGY) 10 MG CAPS Take by mouth. 07/30/16  Yes [provider]  Cholecalciferol (VITAMIN D3) 5000 units TABS Take 5,000 tablets by mouth daily.   Yes [provider]  COLLAGEN PO Take by mouth.   Yes [provider]  Cyanocobalamin (VITAMIN B-12) 5000 MCG SUBL Place 5,000 mcg under the tongue daily.   Yes [provider]  diltiazem (CARDIZEM CD) 240 MG 24 hr capsule TAKE 1 CAPSULE BY MOUTH EVERY DAY 12/06/18  Yes Fenton Malling M, PA-C  FLUoxetine (PROZAC) 20 MG capsule TAKE 3 CAPSULES BY MOUTH EVERY DAY 10/21/19  Yes Fenton Malling M, PA-C  losartan (COZAAR) 100 MG tablet Take 1 tablet (100 mg total) by mouth daily. 12/16/19  Yes Mar Daring, PA-C  meloxicam (MOBIC) 15 MG tablet TAKE 1 TABLET(15 MG) BY MOUTH DAILY 11/30/19  Yes Fenton Malling M, PA-C  montelukast (SINGULAIR) 10 MG tablet TAKE 1 TABLET BY MOUTH DAILY 01/04/19  Yes Mar Daring, PA-C  acetaminophen (TYLENOL) 650 MG CR tablet Take 650-1,300 mg by mouth at bedtime. As needed for arthritis.    [provider]  metaxalone (SKELAXIN) 800 MG tablet Take 0.5-1 tablets  (400-800 mg total) by mouth 3 (three) times daily as needed for muscle spasms. 05/26/19   Mar Daring, PA-C  traMADol Veatrice Bourbon) 50 MG tablet 1/2-1 po bid prn 06/24/19   [provider]    Allergies as of 11/25/2019 - Review Complete 11/18/2019  Allergen Reaction Noted  . Latex Itching 07/30/2016  . Simvastatin  05/29/2015  . Sulfa antibiotics  05/29/2015  . Levofloxacin Rash 08/01/2016  . Prednisone Rash 08/01/2016    Family History  Problem Relation Age of Onset  . Cancer Mother        breast  . Breast cancer Mother   . Depression Father   . Heart disease Father   . Alcohol abuse Father   . Hypertension Sister   . Diabetes Brother   . Diabetes Brother   . Breast cancer Maternal Aunt   . Breast cancer Cousin     Social History   Socioeconomic History  . Marital status: Divorced    Spouse name: Not on file  . Number of children: Not on file  . Years of education: Not on file  . Highest education level: Not on file  Occupational History  . Not on file  Tobacco Use  . Smoking status: Never Smoker  . Smokeless tobacco: Never Used  Substance and Sexual Activity  . Alcohol use: Not Currently  . Drug use: No  . Sexual activity: Not on file  Other  Topics Concern  . Not on file  Social History Narrative  . Not on file   Social Determinants of Health   Financial Resource Strain:   . Difficulty of Paying Living Expenses:   Food Insecurity:   . Worried About Charity fundraiser in the Last Year:   . Arboriculturist in the Last Year:   Transportation Needs:   . Film/video editor (Medical):   Marland Kitchen Lack of Transportation (Non-Medical):   Physical Activity:   . Days of Exercise per Week:   . Minutes of Exercise per Session:   Stress:   . Feeling of Stress :   Social Connections:   . Frequency of Communication with Friends and Family:   . Frequency of Social Gatherings with Friends and Family:   . Attends Religious Services:   . Active Member of  Clubs or Organizations:   . Attends Archivist Meetings:   Marland Kitchen Marital Status:   Intimate Partner Violence:   . Fear of Current or Ex-Partner:   . Emotionally Abused:   Marland Kitchen Physically Abused:   . Sexually Abused:     Review of Systems: See HPI, otherwise negative ROS  Physical Exam: BP (!) 142/57   Pulse 93   Temp (!) 96.8 F (36 C) (Temporal)   Resp 16   Ht 5\' 3"  (1.6 m)   Wt (!) 145.2 kg   SpO2 97%   BMI 56.69 kg/m  General:   Alert,  pleasant and cooperative in NAD Head:  Normocephalic and atraumatic. Neck:  Supple; no masses or thyromegaly. Lungs:  Clear throughout to auscultation, normal respiratory effort.    Heart:  +S1, +S2, Regular rate and rhythm, No edema. Abdomen:  Soft, nontender and nondistended. Normal bowel sounds, without guarding, and without rebound.   Neurologic:  Alert and  oriented x4;  grossly normal neurologically.  Impression/Plan: [name] is here for an colonoscopy to be performed for Screening colonoscopy average risk   Risks, benefits, limitations, and alternatives regarding  colonoscopy have been reviewed with the patient.  Questions have been answered.  All parties agreeable.   Jonathon Bellows, MD  12/23/2019, 8:23 AM

## 2019-12-23 NOTE — Anesthesia Preprocedure Evaluation (Addendum)
Anesthesia Evaluation  Patient identified by MRN, date of birth, ID band Patient awake    Reviewed: Allergy & Precautions, NPO status , Patient's Chart, lab work & pertinent test results  Airway Mallampati: II  TM Distance: >3 FB     Dental  (+) Teeth Intact   Pulmonary    Pulmonary exam normal        Cardiovascular hypertension, Pt. on medications Normal cardiovascular exam     Neuro/Psych PSYCHIATRIC DISORDERS Anxiety Depression  Neuromuscular disease    GI/Hepatic Neg liver ROS,   Endo/Other  Morbid obesity  Renal/GU negative Renal ROS  negative genitourinary   Musculoskeletal  (+) Arthritis , Osteoarthritis,    Abdominal Normal abdominal exam  (+)   Peds negative pediatric ROS (+)  Hematology negative hematology ROS (+)   Anesthesia Other Findings Past Medical History: No date: Allergy No date: Anxiety No date: Depression No date: Hyperlipidemia No date: Hypertension  Reproductive/Obstetrics                            Anesthesia Physical Anesthesia Plan  ASA: III  Anesthesia Plan: General   Post-op Pain Management:    Induction: Intravenous  PONV Risk Score and Plan:   Airway Management Planned: Nasal Cannula  Additional Equipment:   Intra-op Plan:   Post-operative Plan:   Informed Consent: I have reviewed the patients History and Physical, chart, labs and discussed the procedure including the risks, benefits and alternatives for the proposed anesthesia with the patient or authorized representative who has indicated his/her understanding and acceptance.     Dental advisory given  Plan Discussed with: CRNA and Surgeon  Anesthesia Plan Comments:         Anesthesia Quick Evaluation

## 2019-12-28 ENCOUNTER — Ambulatory Visit: Payer: Managed Care, Other (non HMO) | Attending: Internal Medicine

## 2019-12-28 DIAGNOSIS — Z23 Encounter for immunization: Secondary | ICD-10-CM

## 2019-12-28 NOTE — Progress Notes (Signed)
   Covid-19 Vaccination Clinic  Name:  Ashley Mccullough    MRN: [id number] DOB: [birth year]  12/28/2019  Ashley Mccullough was observed post Covid-19 immunization for 30 minutes based on pre-vaccination screening without incident. She was provided with Vaccine Information Sheet and instruction to access the V-Safe system.   Ashley Mccullough was instructed to call 911 with any severe reactions post vaccine: Marland Kitchen Difficulty breathing  . Swelling of face and throat  . A fast heartbeat  . A bad rash all over body  . Dizziness and weakness   Immunizations Administered    Name Date Dose VIS Date Route   Pfizer COVID-19 Vaccine 12/28/2019  4:42 PM 0.3 mL 08/26/2019 Intramuscular   Manufacturer: [employer]   Lot: [id number]   [place]: [id number]

## 2020-01-13 ENCOUNTER — Encounter: Payer: Self-pay | Admitting: Physician Assistant

## 2020-01-13 ENCOUNTER — Other Ambulatory Visit: Payer: Self-pay

## 2020-01-13 ENCOUNTER — Other Ambulatory Visit: Payer: Self-pay | Admitting: Physician Assistant

## 2020-01-13 ENCOUNTER — Ambulatory Visit: Payer: Managed Care, Other (non HMO) | Admitting: Physician Assistant

## 2020-01-13 VITALS — BP 152/88 | HR 72 | Temp 97.4°F | Resp 16 | Wt 320.1 lb

## 2020-01-13 DIAGNOSIS — I10 Essential (primary) hypertension: Secondary | ICD-10-CM

## 2020-01-13 MED ORDER — DILTIAZEM HCL ER COATED BEADS 300 MG PO CP24: 300.0000 mg | ORAL_CAPSULE | Freq: Every day | ORAL | 1 refills | Status: DC

## 2020-01-13 NOTE — Progress Notes (Signed)
Established patient visit   Patient: Ashley Mccullough   DOB: [birth year]   59 y.o. Female  MRN: [id number] Visit Date: 01/13/2020  Today's healthcare provider: Mar Daring, PA-C   Chief Complaint  Patient presents with  . Follow-up    Hypertension   Subjective    HPI Follow up for Hypertension  The patient was last seen for this 4 weeks ago. Changes made at last visit include increase Losartan to 100mg  (may take 2 of her 50mg  until she runs out). Continue Diltiazem 240mg .    She reports excellent compliance with treatment. She feels that condition is Improved. Reports that she is getting readings 163/80 to way higher and sometimes way lower. She is not having side effects.   BP Readings from Last 3 Encounters:  01/13/20 (!) 152/88  12/23/19 124/64  12/16/19 (!) 153/95   Wt Readings from Last 3 Encounters:  01/13/20 (!) 320 lb 1.6 oz (145.2 kg)  12/23/19 (!) 320 lb (145.2 kg)  12/16/19 (!) 320 lb 9.6 oz (145.4 kg)   ----------------------------------------------------------------------------------------- She also need a from to be fill out for parking placard.  Patient Active Problem List   Diagnosis Date Noted  . Lumbar radiculitis 11/18/2019  . Spinal stenosis of lumbar region with neurogenic claudication 11/18/2019  . DDD (degenerative disc disease), lumbosacral 11/18/2019  . Acute cholecystitis   . Cholecystitis, acute 01/19/2019  . Mild episode of recurrent major depressive disorder ([place]) 10/26/2018  . BMI 50.0-59.9, adult ([place]) 07/01/2017  . Morbid obesity due to excess calories ([place]) 12/24/2015  . Allergic rhinitis 05/29/2015  . Anxiety, generalized 05/29/2015  . D (diarrhea) 05/29/2015  . Avitaminosis D 05/29/2015  . Borderline diabetes 05/29/2015  . Fatigue 05/29/2015  . Depression 03/15/2015  . Hypertension 03/15/2015  . Cannot sleep 11/13/2009  . Carbuncle and furuncle 03/13/2008  . Congenital pes planus 03/13/2008  . CD (contact  dermatitis) 04/29/2007  . Hypercholesterolemia without hypertriglyceridemia 09/16/2006  . Arthropathia 08/11/2006  . Clinical depression 08/11/2006   Past Medical History:  Diagnosis Date  . Allergy   . Anxiety   . Depression   . Hyperlipidemia   . Hypertension        Medications: Outpatient Medications Prior to Visit  Medication Sig  . acetaminophen (TYLENOL) 650 MG CR tablet Take 650-1,300 mg by mouth at bedtime. As needed for arthritis.  Marland Kitchen buPROPion (WELLBUTRIN XL) 150 MG 24 hr tablet TAKE 1 TABLET BY MOUTH EVERY DAY  . Cetirizine HCl (ZYRTEC ALLERGY) 10 MG CAPS Take by mouth.  . Cholecalciferol (VITAMIN D3) 5000 units TABS Take 5,000 tablets by mouth daily.  . COLLAGEN PO Take by mouth.  . Cyanocobalamin (VITAMIN B-12) 5000 MCG SUBL Place 5,000 mcg under the tongue daily.  Marland Kitchen diltiazem (CARDIZEM CD) 240 MG 24 hr capsule TAKE 1 CAPSULE BY MOUTH EVERY DAY  . FLUoxetine (PROZAC) 20 MG capsule TAKE 3 CAPSULES BY MOUTH EVERY DAY  . losartan (COZAAR) 100 MG tablet Take 1 tablet (100 mg total) by mouth daily.  . meloxicam (MOBIC) 15 MG tablet TAKE 1 TABLET(15 MG) BY MOUTH DAILY  . metaxalone (SKELAXIN) 800 MG tablet Take 0.5-1 tablets (400-800 mg total) by mouth 3 (three) times daily as needed for muscle spasms.  . montelukast (SINGULAIR) 10 MG tablet TAKE 1 TABLET BY MOUTH DAILY  . traMADol (ULTRAM) 50 MG tablet 1/2-1 po bid prn   No facility-administered medications prior to visit.    Review of Systems  Last CBC Lab Results  Component Value Date   WBC 6.9 11/25/2019   HGB 12.1 11/25/2019   HCT 37.3 11/25/2019   MCV 87 11/25/2019   MCH 28.1 11/25/2019   RDW 14.1 11/25/2019   PLT 295 53/66/4403   Last metabolic panel Lab Results  Component Value Date   GLUCOSE 92 11/25/2019   NA 138 11/25/2019   K 4.7 11/25/2019   CL 104 11/25/2019   CO2 22 11/25/2019   BUN 19 11/25/2019   CREATININE 1.15 (H) 11/25/2019   GFRNONAA 53 (L) 11/25/2019   GFRAA 61 11/25/2019    CALCIUM 9.6 11/25/2019   PHOS 3.7 01/23/2019   PROT 7.2 11/25/2019   ALBUMIN 4.1 11/25/2019   LABGLOB 3.1 11/25/2019   AGRATIO 1.3 11/25/2019   BILITOT 0.2 11/25/2019   ALKPHOS 126 (H) 11/25/2019   AST 13 11/25/2019   ALT 12 11/25/2019   ANIONGAP 11 01/23/2019      Objective    BP (!) 152/88 (BP Location: Left Wrist, Patient Position: Sitting, Cuff Size: Large)   Pulse 72   Temp (!) 97.4 F (36.3 C) (Temporal)   Resp 16   Wt (!) 320 lb 1.6 oz (145.2 kg)   BMI 56.70 kg/m  BP Readings from Last 3 Encounters:  01/13/20 (!) 152/88  12/23/19 124/64  12/16/19 (!) 153/95   Wt Readings from Last 3 Encounters:  01/13/20 (!) 320 lb 1.6 oz (145.2 kg)  12/23/19 (!) 320 lb (145.2 kg)  12/16/19 (!) 320 lb 9.6 oz (145.4 kg)      Physical Exam Vitals reviewed.  Constitutional:      General: She is not in acute distress.    Appearance: Normal appearance. She is well-developed. She is obese. She is not diaphoretic.  Neck:     Thyroid: No thyromegaly.     Vascular: No JVD.     Trachea: No tracheal deviation.  Cardiovascular:     Rate and Rhythm: Normal rate and regular rhythm.     Heart sounds: Normal heart sounds. No murmur. No friction rub. No gallop.   Pulmonary:     Effort: Pulmonary effort is normal. No respiratory distress.     Breath sounds: Normal breath sounds. No wheezing or rales.  Musculoskeletal:     Cervical back: Normal range of motion and neck supple.  Lymphadenopathy:     Cervical: No cervical adenopathy.  Neurological:     Mental Status: She is alert.       No results found for any visits on 01/13/20.  Assessment & Plan     1. Essential hypertension BP still borderline high. Continue Losartan 100mg . Increase Diltiazem to 300mg  from 240mg . F/U in 4-6 weeks.    No follow-ups on file.      Reynolds Bowl, PA-C, have reviewed all documentation for this visit. The documentation on 01/16/20 for the exam, diagnosis, procedures, and orders are all  accurate and complete.   Rubye Beach  [facility] [phone] (phone) [phone] (fax)  [place]

## 2020-01-16 ENCOUNTER — Encounter: Payer: Self-pay | Admitting: Physician Assistant

## 2020-02-10 ENCOUNTER — Ambulatory Visit: Payer: Managed Care, Other (non HMO) | Admitting: Physician Assistant

## 2020-02-10 ENCOUNTER — Other Ambulatory Visit: Payer: Self-pay

## 2020-02-10 ENCOUNTER — Encounter: Payer: Self-pay | Admitting: Physician Assistant

## 2020-02-10 VITALS — BP 172/83 | HR 80 | Temp 97.0°F | Resp 16 | Wt 321.0 lb

## 2020-02-10 DIAGNOSIS — I10 Essential (primary) hypertension: Secondary | ICD-10-CM | POA: Diagnosis not present

## 2020-02-10 MED ORDER — AMLODIPINE BESYLATE 5 MG PO TABS: 5.0000 mg | ORAL_TABLET | Freq: Every day | ORAL | 3 refills | Status: DC

## 2020-02-10 NOTE — Patient Instructions (Addendum)
DASH Eating Plan DASH stands for "Dietary Approaches to Stop Hypertension." The DASH eating plan is a healthy eating plan that has been shown to reduce high blood pressure (hypertension). It may also reduce your risk for type 2 diabetes, heart disease, and stroke. The DASH eating plan may also help with weight loss. What are tips for following this plan?  General guidelines  Avoid eating more than 2,300 mg (milligrams) of salt (sodium) a day. If you have hypertension, you may need to reduce your sodium intake to 1,500 mg a day.  Limit alcohol intake to no more than 1 drink a day for nonpregnant women and 2 drinks a day for men. One drink equals 12 oz of beer, 5 oz of wine, or 1 oz of hard liquor.  Work with your health care provider to maintain a healthy body weight or to lose weight. Ask what an ideal weight is for you.  Get at least 30 minutes of exercise that causes your heart to beat faster (aerobic exercise) most days of the week. Activities may include walking, swimming, or biking.  Work with your health care provider or diet and nutrition specialist (dietitian) to adjust your eating plan to your individual calorie needs. Reading food labels   Check food labels for the amount of sodium per serving. Choose foods with less than 5 percent of the Daily Value of sodium. Generally, foods with less than 300 mg of sodium per serving fit into this eating plan.  To find whole grains, look for the word "whole" as the first word in the ingredient list. Shopping  Buy products labeled as "low-sodium" or "no salt added."  Buy fresh foods. Avoid canned foods and premade or frozen meals. Cooking  Avoid adding salt when cooking. Use salt-free seasonings or herbs instead of table salt or sea salt. Check with your health care provider or pharmacist before using salt substitutes.  Do not fry foods. Cook foods using healthy methods such as baking, boiling, grilling, and broiling instead.  Cook with  heart-healthy oils, such as olive, canola, soybean, or sunflower oil. Meal planning  Eat a balanced diet that includes: ? 5 or more servings of fruits and vegetables each day. At each meal, try to fill half of your plate with fruits and vegetables. ? Up to 6-8 servings of whole grains each day. ? Less than 6 oz of lean meat, poultry, or fish each day. A 3-oz serving of meat is about the same size as a deck of cards. One egg equals 1 oz. ? 2 servings of low-fat dairy each day. ? A serving of nuts, seeds, or beans 5 times each week. ? Heart-healthy fats. Healthy fats called Omega-3 fatty acids are found in foods such as flaxseeds and coldwater fish, like sardines, salmon, and mackerel.  Limit how much you eat of the following: ? Canned or prepackaged foods. ? Food that is high in trans fat, such as fried foods. ? Food that is high in saturated fat, such as fatty meat. ? Sweets, desserts, sugary drinks, and other foods with added sugar. ? Full-fat dairy products.  Do not salt foods before eating.  Try to eat at least 2 vegetarian meals each week.  Eat more home-cooked food and less restaurant, buffet, and fast food.  When eating at a restaurant, ask that your food be prepared with less salt or no salt, if possible. What foods are recommended? The items listed may not be a complete list. Talk with your dietitian about   what dietary choices are best for you. Grains Whole-grain or whole-wheat bread. Whole-grain or whole-wheat pasta. Brown rice. Oatmeal. Quinoa. Bulgur. Whole-grain and low-sodium cereals. Pita bread. Low-fat, low-sodium crackers. Whole-wheat flour tortillas. Vegetables Fresh or frozen vegetables (raw, steamed, roasted, or grilled). Low-sodium or reduced-sodium tomato and vegetable juice. Low-sodium or reduced-sodium tomato sauce and tomato paste. Low-sodium or reduced-sodium canned vegetables. Fruits All fresh, dried, or frozen fruit. Canned fruit in natural juice (without  added sugar). Meat and other protein foods Skinless chicken or turkey. Ground chicken or turkey. Pork with fat trimmed off. Fish and seafood. Egg whites. Dried beans, peas, or lentils. Unsalted nuts, nut butters, and seeds. Unsalted canned beans. Lean cuts of beef with fat trimmed off. Low-sodium, lean deli meat. Dairy Low-fat (1%) or fat-free (skim) milk. Fat-free, low-fat, or reduced-fat cheeses. Nonfat, low-sodium ricotta or cottage cheese. Low-fat or nonfat yogurt. Low-fat, low-sodium cheese. Fats and oils Soft margarine without trans fats. Vegetable oil. Low-fat, reduced-fat, or light mayonnaise and salad dressings (reduced-sodium). Canola, safflower, olive, soybean, and sunflower oils. Avocado. Seasoning and other foods Herbs. Spices. Seasoning mixes without salt. Unsalted popcorn and pretzels. Fat-free sweets. What foods are not recommended? The items listed may not be a complete list. Talk with your dietitian about what dietary choices are best for you. Grains Baked goods made with fat, such as croissants, muffins, or some breads. Dry pasta or rice meal packs. Vegetables Creamed or fried vegetables. Vegetables in a cheese sauce. Regular canned vegetables (not low-sodium or reduced-sodium). Regular canned tomato sauce and paste (not low-sodium or reduced-sodium). Regular tomato and vegetable juice (not low-sodium or reduced-sodium). Pickles. Olives. Fruits Canned fruit in a light or heavy syrup. Fried fruit. Fruit in cream or butter sauce. Meat and other protein foods Fatty cuts of meat. Ribs. Fried meat. Bacon. Sausage. Bologna and other processed lunch meats. Salami. Fatback. Hotdogs. Bratwurst. Salted nuts and seeds. Canned beans with added salt. Canned or smoked fish. Whole eggs or egg yolks. Chicken or turkey with skin. Dairy Whole or 2% milk, cream, and half-and-half. Whole or full-fat cream cheese. Whole-fat or sweetened yogurt. Full-fat cheese. Nondairy creamers. Whipped toppings.  Processed cheese and cheese spreads. Fats and oils Butter. Stick margarine. Lard. Shortening. Ghee. Bacon fat. Tropical oils, such as coconut, palm kernel, or palm oil. Seasoning and other foods Salted popcorn and pretzels. Onion salt, garlic salt, seasoned salt, table salt, and sea salt. Worcestershire sauce. Tartar sauce. Barbecue sauce. Teriyaki sauce. Soy sauce, including reduced-sodium. Steak sauce. Canned and packaged gravies. Fish sauce. Oyster sauce. Cocktail sauce. Horseradish that you find on the shelf. Ketchup. Mustard. Meat flavorings and tenderizers. Bouillon cubes. Hot sauce and Tabasco sauce. Premade or packaged marinades. Premade or packaged taco seasonings. Relishes. Regular salad dressings. Where to find more information:  National Heart, Lung, and Blood Institute: www.nhlbi.nih.gov  American Heart Association: www.heart.org Summary  The DASH eating plan is a healthy eating plan that has been shown to reduce high blood pressure (hypertension). It may also reduce your risk for type 2 diabetes, heart disease, and stroke.  With the DASH eating plan, you should limit salt (sodium) intake to 2,300 mg a day. If you have hypertension, you may need to reduce your sodium intake to 1,500 mg a day.  When on the DASH eating plan, aim to eat more fresh fruits and vegetables, whole grains, lean proteins, low-fat dairy, and heart-healthy fats.  Work with your health care provider or diet and nutrition specialist (dietitian) to adjust your eating plan to your   individual calorie needs. This information is not intended to replace advice given to you by your health care provider. Make sure you discuss any questions you have with your health care provider. Document Revised: 08/14/2017 Document Reviewed: 08/25/2016 Elsevier Patient Education  2020 Elsevier Inc.  

## 2020-02-10 NOTE — Progress Notes (Signed)
Established patient visit   Patient: Ashley Mccullough   DOB: 12-15-1960   59 y.o. Female  MRN: [id number] Visit Date: 02/10/2020  Today's healthcare provider: Mar Daring, PA-C   Chief Complaint  Patient presents with  . Follow-up    BP   Subjective    HPI Patient here to follow up on essential hypertension. She was seen 4 weeks ago and BP was still borderline high. She currently taking Losartan 100mg . She was advised to increase Diltiazem to 300mg  from 240mg  but patient reports that she started to have side effect. Side effect: dizziness and blurry vision. Reports that she went down to 240mg  but was still having the symptoms so she stopped the medicine and it has been a week since she stopped it. Reports that she is only taking the Losartan and feels much better. She has also noticed that she is not having the heart palpitations that she was having.  BP at home 160's-170's/80's left wrist and on the Right wrist she gets 130's/80's.  Patient Active Problem List   Diagnosis Date Noted  . Lumbar radiculitis 11/18/2019  . Spinal stenosis of lumbar region with neurogenic claudication 11/18/2019  . DDD (degenerative disc disease), lumbosacral 11/18/2019  . Acute cholecystitis   . Cholecystitis, acute 01/19/2019  . Mild episode of recurrent major depressive disorder ([place]) 10/26/2018  . BMI 50.0-59.9, adult ([place]) 07/01/2017  . Morbid obesity due to excess calories ([place]) 12/24/2015  . Allergic rhinitis 05/29/2015  . Anxiety, generalized 05/29/2015  . D (diarrhea) 05/29/2015  . Avitaminosis D 05/29/2015  . Borderline diabetes 05/29/2015  . Fatigue 05/29/2015  . Depression 03/15/2015  . Hypertension 03/15/2015  . Cannot sleep 11/13/2009  . Carbuncle and furuncle 03/13/2008  . Congenital pes planus 03/13/2008  . CD (contact dermatitis) 04/29/2007  . Hypercholesterolemia without hypertriglyceridemia 09/16/2006  . Arthropathia 08/11/2006  . Clinical depression  08/11/2006   Past Medical History:  Diagnosis Date  . Allergy   . Anxiety   . Depression   . Hyperlipidemia   . Hypertension        Medications: Outpatient Medications Prior to Visit  Medication Sig  . acetaminophen (TYLENOL) 650 MG CR tablet Take 650-1,300 mg by mouth at bedtime. As needed for arthritis.  Marland Kitchen buPROPion (WELLBUTRIN XL) 150 MG 24 hr tablet TAKE 1 TABLET BY MOUTH EVERY DAY  . Cetirizine HCl (ZYRTEC ALLERGY) 10 MG CAPS Take by mouth.  . Cholecalciferol (VITAMIN D3) 5000 units TABS Take 5,000 tablets by mouth daily.  . COLLAGEN PO Take by mouth.  . Cyanocobalamin (VITAMIN B-12) 5000 MCG SUBL Place 5,000 mcg under the tongue daily.  Marland Kitchen FLUoxetine (PROZAC) 20 MG capsule TAKE 3 CAPSULES BY MOUTH EVERY DAY  . losartan (COZAAR) 100 MG tablet Take 1 tablet (100 mg total) by mouth daily.  . meloxicam (MOBIC) 15 MG tablet TAKE 1 TABLET(15 MG) BY MOUTH DAILY  . metaxalone (SKELAXIN) 800 MG tablet Take 0.5-1 tablets (400-800 mg total) by mouth 3 (three) times daily as needed for muscle spasms.  . montelukast (SINGULAIR) 10 MG tablet TAKE 1 TABLET BY MOUTH DAILY  . traMADol (ULTRAM) 50 MG tablet 1/2-1 po bid prn  . [DISCONTINUED] diltiazem (CARDIZEM CD) 300 MG 24 hr capsule TAKE 1 CAPSULE(300 MG) BY MOUTH DAILY   No facility-administered medications prior to visit.    Review of Systems  Constitutional: Negative.   Respiratory: Negative.   Cardiovascular: Positive for leg swelling. Negative for chest pain and palpitations.  Neurological:  Negative.   Psychiatric/Behavioral: Negative.     Last CBC Lab Results  Component Value Date   WBC 6.9 11/25/2019   HGB 12.1 11/25/2019   HCT 37.3 11/25/2019   MCV 87 11/25/2019   MCH 28.1 11/25/2019   RDW 14.1 11/25/2019   PLT 295 62/70/3500   Last metabolic panel Lab Results  Component Value Date   GLUCOSE 92 11/25/2019   NA 138 11/25/2019   K 4.7 11/25/2019   CL 104 11/25/2019   CO2 22 11/25/2019   BUN 19 11/25/2019    CREATININE 1.15 (H) 11/25/2019   GFRNONAA 53 (L) 11/25/2019   GFRAA 61 11/25/2019   CALCIUM 9.6 11/25/2019   PHOS 3.7 01/23/2019   PROT 7.2 11/25/2019   ALBUMIN 4.1 11/25/2019   LABGLOB 3.1 11/25/2019   AGRATIO 1.3 11/25/2019   BILITOT 0.2 11/25/2019   ALKPHOS 126 (H) 11/25/2019   AST 13 11/25/2019   ALT 12 11/25/2019   ANIONGAP 11 01/23/2019      Objective    BP (!) 172/83 (BP Location: Left Wrist, Patient Position: Sitting, Cuff Size: Large) Comment: pt's bp wrist cuff left side 192/101 P:80 177/107 P:80  Pulse 80   Temp (!) 97 F (36.1 C) (Temporal)   Resp 16   Wt (!) 321 lb (145.6 kg)   BMI 56.86 kg/m  BP Readings from Last 3 Encounters:  02/10/20 (!) 172/83  01/13/20 (!) 152/88  12/23/19 124/64   Wt Readings from Last 3 Encounters:  02/10/20 (!) 321 lb (145.6 kg)  01/13/20 (!) 320 lb 1.6 oz (145.2 kg)  12/23/19 (!) 320 lb (145.2 kg)      Physical Exam Vitals reviewed.  Constitutional:      General: She is not in acute distress.    Appearance: Normal appearance. She is well-developed. She is obese. She is not ill-appearing or diaphoretic.  Neck:     Vascular: No JVD.  Cardiovascular:     Rate and Rhythm: Normal rate and regular rhythm.     Pulses: Normal pulses.     Heart sounds: Normal heart sounds. No murmur. No friction rub. No gallop.   Pulmonary:     Effort: Pulmonary effort is normal. No respiratory distress.     Breath sounds: Normal breath sounds. No wheezing or rales.  Musculoskeletal:     Cervical back: Normal range of motion and neck supple.  Neurological:     Mental Status: She is alert.       No results found for any visits on 02/10/20.  Assessment & Plan     1. Essential hypertension Still elevated. Patient reports improved symptoms of dizziness and palpitations with stopping diltiazem. Will allow her to continue losartan 100mg  only for one week. She is to send mychart message next week with BP readings. If elevated still, will add  medication to losartan. She is in agreement.    Return if symptoms worsen or fail to improve.      Reynolds Bowl, PA-C, have reviewed all documentation for this visit. The documentation on 02/14/20 for the exam, diagnosis, procedures, and orders are all accurate and complete.   Rubye Beach  [facility] [phone] (phone) [phone] (fax)  [place]

## 2020-02-21 ENCOUNTER — Other Ambulatory Visit: Payer: Self-pay | Admitting: Physician Assistant

## 2020-02-21 ENCOUNTER — Encounter: Payer: Self-pay | Admitting: Physician Assistant

## 2020-02-21 DIAGNOSIS — I10 Essential (primary) hypertension: Secondary | ICD-10-CM

## 2020-02-21 MED ORDER — METOPROLOL SUCCINATE ER 25 MG PO TB24: 25.0000 mg | ORAL_TABLET | Freq: Every day | ORAL | 0 refills | Status: DC

## 2020-02-24 ENCOUNTER — Telehealth: Payer: Self-pay

## 2020-02-24 NOTE — Telephone Encounter (Signed)
Pt returned call. Pt was advised and stated she doesn't wish to pick up copy of forms at this time. Form removed from front desk. TNP

## 2020-02-24 NOTE — Telephone Encounter (Signed)
Called pt LMTCB, to advised pt FMLA forms were completed and have been faxed. Copy for pt to pick up has been placed up front. Please advise pt if she returns call. Thanks TNP

## 2020-03-06 ENCOUNTER — Other Ambulatory Visit: Payer: Self-pay | Admitting: Physician Assistant

## 2020-03-06 ENCOUNTER — Encounter: Payer: Self-pay | Admitting: Physician Assistant

## 2020-03-06 DIAGNOSIS — I10 Essential (primary) hypertension: Secondary | ICD-10-CM

## 2020-03-07 ENCOUNTER — Encounter: Payer: Self-pay | Admitting: Physician Assistant

## 2020-03-07 NOTE — Telephone Encounter (Signed)
Recommend OV (in person or virtual) to discuss anxiety

## 2020-03-12 ENCOUNTER — Ambulatory Visit: Payer: Managed Care, Other (non HMO) | Admitting: Physician Assistant

## 2020-03-12 NOTE — Progress Notes (Deleted)
Established patient visit   Patient: Ashley Mccullough   DOB: Feb 12, 1961   59 y.o. Female  MRN: [id number] Visit Date: 03/12/2020  Today's healthcare provider: Trinna Post, PA-C   No chief complaint on file.  Subjective    HPI Hypertension, follow-up  BP Readings from Last 3 Encounters:  02/10/20 (!) 172/83  01/13/20 (!) 152/88  12/23/19 124/64   Wt Readings from Last 3 Encounters:  02/10/20 (!) 321 lb (145.6 kg)  01/13/20 (!) 320 lb 1.6 oz (145.2 kg)  12/23/19 (!) 320 lb (145.2 kg)     She was last seen for hypertension 1 months ago.  BP at that visit was 172/83. Management since that visit includes continuing Losartan 100mg  daily.  She reports {excellent/good/fair/poor:19665} compliance with treatment. She {is/is not:9024} having side effects. {document side effects if present:1} She is following a {diet:21022986} diet. She {is/is not:9024} exercising. She {does/does not:200015} smoke.  Use of agents associated with hypertension: {bp agents assoc with hypertension:511::"none"}.   Outside blood pressures are {***enter patient reported home BP readings, or 'not being checked':1}. Symptoms: {Yes/No:20286} chest pain {Yes/No:20286} chest pressure  {Yes/No:20286} palpitations {Yes/No:20286} syncope  {Yes/No:20286} dyspnea {Yes/No:20286} orthopnea  {Yes/No:20286} paroxysmal nocturnal dyspnea {Yes/No:20286} lower extremity edema   Pertinent labs: Lab Results  Component Value Date   CHOL 245 (H) 11/25/2019   HDL 89 11/25/2019   LDLCALC 147 (H) 11/25/2019   TRIG 53 11/25/2019   CHOLHDL 2.8 07/02/2018   Lab Results  Component Value Date   NA 138 11/25/2019   K 4.7 11/25/2019   CREATININE 1.15 (H) 11/25/2019   GFRNONAA 53 (L) 11/25/2019   GFRAA 61 11/25/2019   GLUCOSE 92 11/25/2019     The 10-year ASCVD risk score Mikey Bussing DC Jr., et al., 2013) is: 12.5%     {Show patient history (optional):23778::" "}   Medications: Outpatient Medications Prior  to Visit  Medication Sig   acetaminophen (TYLENOL) 650 MG CR tablet Take 650-1,300 mg by mouth at bedtime. As needed for arthritis.   buPROPion (WELLBUTRIN XL) 150 MG 24 hr tablet TAKE 1 TABLET BY MOUTH EVERY DAY   Cetirizine HCl (ZYRTEC ALLERGY) 10 MG CAPS Take by mouth.   Cholecalciferol (VITAMIN D3) 5000 units TABS Take 5,000 tablets by mouth daily.   COLLAGEN PO Take by mouth.   Cyanocobalamin (VITAMIN B-12) 5000 MCG SUBL Place 5,000 mcg under the tongue daily.   FLUoxetine (PROZAC) 20 MG capsule TAKE 3 CAPSULES BY MOUTH EVERY DAY   losartan (COZAAR) 100 MG tablet Take 1 tablet (100 mg total) by mouth daily.   meloxicam (MOBIC) 15 MG tablet TAKE 1 TABLET(15 MG) BY MOUTH DAILY   metaxalone (SKELAXIN) 800 MG tablet Take 0.5-1 tablets (400-800 mg total) by mouth 3 (three) times daily as needed for muscle spasms.   metoprolol succinate (TOPROL-XL) 25 MG 24 hr tablet Take 1 tablet (25 mg total) by mouth daily.   montelukast (SINGULAIR) 10 MG tablet TAKE 1 TABLET BY MOUTH DAILY   traMADol (ULTRAM) 50 MG tablet 1/2-1 po bid prn   No facility-administered medications prior to visit.    Review of Systems  {Heme   Chem   Endocrine   Serology   Results Review (optional):23779::" "}  Objective    There were no vitals taken for this visit. {Show previous vital signs (optional):23777::" "}  Physical Exam  ***  No results found for any visits on 03/12/20.  Assessment & Plan     ***  No  follow-ups on file.      {provider attestation***:1}   Paulene Floor  [facility] [phone] (phone) [phone] (fax)  [place]

## 2020-03-15 ENCOUNTER — Other Ambulatory Visit: Payer: Self-pay

## 2020-03-15 ENCOUNTER — Encounter: Payer: Self-pay | Admitting: Physician Assistant

## 2020-03-15 ENCOUNTER — Ambulatory Visit: Payer: Managed Care, Other (non HMO) | Admitting: Physician Assistant

## 2020-03-15 ENCOUNTER — Other Ambulatory Visit: Payer: Self-pay | Admitting: Physician Assistant

## 2020-03-15 VITALS — BP 162/81 | HR 68 | Temp 97.1°F | Resp 16 | Wt 320.4 lb

## 2020-03-15 DIAGNOSIS — F332 Major depressive disorder, recurrent severe without psychotic features: Secondary | ICD-10-CM

## 2020-03-15 DIAGNOSIS — I10 Essential (primary) hypertension: Secondary | ICD-10-CM | POA: Diagnosis not present

## 2020-03-15 DIAGNOSIS — F411 Generalized anxiety disorder: Secondary | ICD-10-CM | POA: Diagnosis not present

## 2020-03-15 MED ORDER — HYDROXYZINE HCL 25 MG PO TABS: 25.0000 mg | ORAL_TABLET | Freq: Three times a day (TID) | ORAL | 0 refills | Status: DC | PRN

## 2020-03-15 MED ORDER — METOPROLOL SUCCINATE ER 25 MG PO TB24: 25.0000 mg | ORAL_TABLET | Freq: Every day | ORAL | 0 refills | Status: DC

## 2020-03-15 NOTE — Patient Instructions (Signed)
Hydroxyzine capsules or tablets What is this medicine? HYDROXYZINE (hye Chelsea i zeen) is an antihistamine. This medicine is used to treat allergy symptoms. It is also used to treat anxiety and tension. This medicine can be used with other medicines to induce sleep before surgery. This medicine may be used for other purposes; ask your health care provider or pharmacist if you have questions. COMMON BRAND NAME(S): ANX, Atarax, Rezine, Vistaril What should I tell my health care provider before I take this medicine? They need to know if you have any of these conditions:  glaucoma  heart disease  history of irregular heartbeat  kidney disease  liver disease  lung or breathing disease, like asthma  stomach or intestine problems  thyroid disease  trouble passing urine  an unusual or allergic reaction to hydroxyzine, cetirizine, other medicines, foods, dyes or preservatives  pregnant or trying to get pregnant  breast-feeding How should I use this medicine? Take this medicine by mouth with a full glass of water. Follow the directions on the prescription label. You may take this medicine with food or on an empty stomach. Take your medicine at regular intervals. Do not take your medicine more often than directed. Talk to your pediatrician regarding the use of this medicine in children. Special care may be needed. While this drug may be prescribed for children as young as 38 years of age for selected conditions, precautions do apply. Patients over 15 years old may have a stronger reaction and need a smaller dose. Overdosage: If you think you have taken too much of this medicine contact a poison control center or emergency room at once. NOTE: This medicine is only for you. Do not share this medicine with others. What if I miss a dose? If you miss a dose, take it as soon as you can. If it is almost time for your next dose, take only that dose. Do not take double or extra doses. What may  interact with this medicine? Do not take this medicine with any of the following medications:  cisapride  dronedarone  pimozide  thioridazine This medicine may also interact with the following medications:  alcohol  antihistamines for allergy, cough, and cold  atropine  barbiturate medicines for sleep or seizures, like phenobarbital  certain antibiotics like erythromycin or clarithromycin  certain medicines for anxiety or sleep  certain medicines for bladder problems like oxybutynin, tolterodine  certain medicines for depression or psychotic disturbances  certain medicines for irregular heart beat  certain medicines for Parkinson's disease like benztropine, trihexyphenidyl  certain medicines for seizures like phenobarbital, primidone  certain medicines for stomach problems like dicyclomine, hyoscyamine  certain medicines for travel sickness like scopolamine  ipratropium  narcotic medicines for pain  other medicines that prolong the QT interval (an abnormal heart rhythm) like dofetilide This list may not describe all possible interactions. Give your health care provider a list of all the medicines, herbs, non-prescription drugs, or dietary supplements you use. Also tell them if you smoke, drink alcohol, or use illegal drugs. Some items may interact with your medicine. What should I watch for while using this medicine? Tell your doctor or health care professional if your symptoms do not improve. You may get drowsy or dizzy. Do not drive, use machinery, or do anything that needs mental alertness until you know how this medicine affects you. Do not stand or sit up quickly, especially if you are an older patient. This reduces the risk of dizzy or fainting spells. Alcohol may  interfere with the effect of this medicine. Avoid alcoholic drinks. Your mouth may get dry. Chewing sugarless gum or sucking hard candy, and drinking plenty of water may help. Contact your doctor if the  problem does not go away or is severe. This medicine may cause dry eyes and blurred vision. If you wear contact lenses you may feel some discomfort. Lubricating drops may help. See your eye doctor if the problem does not go away or is severe. If you are receiving skin tests for allergies, tell your doctor you are using this medicine. What side effects may I notice from receiving this medicine? Side effects that you should report to your doctor or health care professional as soon as possible:  allergic reactions like skin rash, itching or hives, swelling of the face, lips, or tongue  changes in vision  confusion  fast, irregular heartbeat  seizures  tremor  trouble passing urine or change in the amount of urine Side effects that usually do not require medical attention (report to your doctor or health care professional if they continue or are bothersome):  constipation  drowsiness  dry mouth  headache  tiredness This list may not describe all possible side effects. Call your doctor for medical advice about side effects. You may report side effects to FDA at 1-800-FDA-1088. Where should I keep my medicine? Keep out of the reach of children. Store at room temperature between 15 and 30 degrees C (59 and 86 degrees F). Keep container tightly closed. Throw away any unused medicine after the expiration date. NOTE: This sheet is a summary. It may not cover all possible information. If you have questions about this medicine, talk to your doctor, pharmacist, or health care provider.  2020 Elsevier/Gold Standard (2018-08-23 13:19:55)

## 2020-03-15 NOTE — Progress Notes (Signed)
Established patient visit   Patient: Ashley Mccullough   DOB: 1961/04/02   59 y.o. Female  MRN: [id number] Visit Date: 03/15/2020  Today's healthcare provider: Mar Daring, PA-C   Chief Complaint  Patient presents with  . Anxiety  . Follow-up    Blood Pressure   Subjective    HPI Patient here to follow up on her blood.Reports new prescription (metoprolol 25mg  once daily) that was added seems to be working for her blood pressure.  The readings are going down (i.e. 157/83, 180/92,113/76 159/94, 157/60, 149/86  ). However,she  is having huge problems with anxiety.  Some of it is due to her mother being in hospital other is regarding her physical pain and trying to cope with it while working. Reports that she read that there is some interaction between the Metoprolol with her antidepressants.   Anxiety: Patient complains of anxiety disorder.  She has 2 the following symptoms: difficulty concentrating, fatigue, feelings of losing control, insomnia, irritable, palpitations, racing thoughts, "when the sun comes out extremely Depressed" hopeless.  A lot of stress.   Depression screen PHQ 2/9 03/15/2020  Decreased Interest 3  Down, Depressed, Hopeless 3  PHQ - 2 Score 6  Altered sleeping 3  Tired, decreased energy 3  Change in appetite 0  Feeling bad or failure about yourself  3  Trouble concentrating 2  Moving slowly or fidgety/restless 0  Suicidal thoughts 0  PHQ-9 Score 17  Difficult doing work/chores Extremely dIfficult   GAD 7 : Generalized Anxiety Score 03/15/2020  Nervous, Anxious, on Edge 3  Control/stop worrying 3  Worry too much - different things 3  Trouble relaxing 2  Restless 0  Easily annoyed or irritable 1  Afraid - awful might happen 2  Total GAD 7 Score 14  Anxiety Difficulty Extremely difficult     Patient Active Problem List   Diagnosis Date Noted  . Lumbar radiculitis 11/18/2019  . Spinal stenosis of lumbar region with neurogenic claudication  11/18/2019  . DDD (degenerative disc disease), lumbosacral 11/18/2019  . Acute cholecystitis   . Cholecystitis, acute 01/19/2019  . Mild episode of recurrent major depressive disorder ([place]) 10/26/2018  . BMI 50.0-59.9, adult ([place]) 07/01/2017  . Morbid obesity due to excess calories ([place]) 12/24/2015  . Allergic rhinitis 05/29/2015  . Anxiety, generalized 05/29/2015  . D (diarrhea) 05/29/2015  . Avitaminosis D 05/29/2015  . Borderline diabetes 05/29/2015  . Fatigue 05/29/2015  . Depression 03/15/2015  . Hypertension 03/15/2015  . Cannot sleep 11/13/2009  . Carbuncle and furuncle 03/13/2008  . Congenital pes planus 03/13/2008  . CD (contact dermatitis) 04/29/2007  . Hypercholesterolemia without hypertriglyceridemia 09/16/2006  . Arthropathia 08/11/2006  . Clinical depression 08/11/2006   Past Medical History:  Diagnosis Date  . Allergy   . Anxiety   . Depression   . Hyperlipidemia   . Hypertension        Medications: Outpatient Medications Prior to Visit  Medication Sig  . acetaminophen (TYLENOL) 650 MG CR tablet Take 650-1,300 mg by mouth at bedtime. As needed for arthritis.  Marland Kitchen buPROPion (WELLBUTRIN XL) 150 MG 24 hr tablet TAKE 1 TABLET BY MOUTH EVERY DAY  . Cetirizine HCl (ZYRTEC ALLERGY) 10 MG CAPS Take by mouth.  . Cholecalciferol (VITAMIN D3) 5000 units TABS Take 5,000 tablets by mouth daily.  . COLLAGEN PO Take by mouth.  . Cyanocobalamin (VITAMIN B-12) 5000 MCG SUBL Place 5,000 mcg under the tongue daily.  Marland Kitchen FLUoxetine (PROZAC) 20 MG  capsule TAKE 3 CAPSULES BY MOUTH EVERY DAY  . losartan (COZAAR) 100 MG tablet Take 1 tablet (100 mg total) by mouth daily.  . meloxicam (MOBIC) 15 MG tablet TAKE 1 TABLET(15 MG) BY MOUTH DAILY  . metaxalone (SKELAXIN) 800 MG tablet Take 0.5-1 tablets (400-800 mg total) by mouth 3 (three) times daily as needed for muscle spasms.  . montelukast (SINGULAIR) 10 MG tablet TAKE 1 TABLET BY MOUTH DAILY  . traMADol (ULTRAM) 50 MG tablet 1/2-1  po bid prn  . [DISCONTINUED] metoprolol succinate (TOPROL-XL) 25 MG 24 hr tablet Take 1 tablet (25 mg total) by mouth daily.   No facility-administered medications prior to visit.    Review of Systems  Constitutional: Negative.   Eyes: Negative for visual disturbance.  Respiratory: Negative.   Cardiovascular: Negative.   Musculoskeletal: Positive for arthralgias, back pain and gait problem.    Last CBC Lab Results  Component Value Date   WBC 6.9 11/25/2019   HGB 12.1 11/25/2019   HCT 37.3 11/25/2019   MCV 87 11/25/2019   MCH 28.1 11/25/2019   RDW 14.1 11/25/2019   PLT 295 73/41/9379   Last metabolic panel Lab Results  Component Value Date   GLUCOSE 92 11/25/2019   NA 138 11/25/2019   K 4.7 11/25/2019   CL 104 11/25/2019   CO2 22 11/25/2019   BUN 19 11/25/2019   CREATININE 1.15 (H) 11/25/2019   GFRNONAA 53 (L) 11/25/2019   GFRAA 61 11/25/2019   CALCIUM 9.6 11/25/2019   PHOS 3.7 01/23/2019   PROT 7.2 11/25/2019   ALBUMIN 4.1 11/25/2019   LABGLOB 3.1 11/25/2019   AGRATIO 1.3 11/25/2019   BILITOT 0.2 11/25/2019   ALKPHOS 126 (H) 11/25/2019   AST 13 11/25/2019   ALT 12 11/25/2019   ANIONGAP 11 01/23/2019      Objective    BP (!) 162/81 (BP Location: Left Wrist, Patient Position: Sitting, Cuff Size: Large)   Pulse 68   Temp (!) 97.1 F (36.2 C) (Temporal)   Resp 16   Wt (!) 320 lb 6.4 oz (145.3 kg)   BMI 56.76 kg/m  BP Readings from Last 3 Encounters:  03/15/20 (!) 162/81  02/10/20 (!) 172/83  01/13/20 (!) 152/88   Wt Readings from Last 3 Encounters:  03/15/20 (!) 320 lb 6.4 oz (145.3 kg)  02/10/20 (!) 321 lb (145.6 kg)  01/13/20 (!) 320 lb 1.6 oz (145.2 kg)      Physical Exam Vitals reviewed.  Constitutional:      General: She is not in acute distress.    Appearance: Normal appearance. She is well-developed. She is obese. She is not ill-appearing or diaphoretic.  Cardiovascular:     Rate and Rhythm: Normal rate and regular rhythm.     Pulses:  Normal pulses.     Heart sounds: Normal heart sounds. No murmur heard.  No friction rub. No gallop.   Pulmonary:     Effort: Pulmonary effort is normal. No respiratory distress.     Breath sounds: Normal breath sounds. No wheezing or rales.  Musculoskeletal:     Cervical back: Normal range of motion and neck supple.     Right lower leg: Edema present.     Left lower leg: Edema present.  Neurological:     Mental Status: She is alert.       No results found for any visits on 03/15/20.  Assessment & Plan     1. GAD (generalized anxiety disorder) Worsening. Will try Hydroxyzine as  below. Continue Fluoxetine 60mg  and wellbutrin XL 150mg . F/U in 4 weeks.  - hydrOXYzine (ATARAX/VISTARIL) 25 MG tablet; Take 1 tablet (25 mg total) by mouth 3 (three) times daily as needed for anxiety.  Dispense: 90 tablet; Refill: 0  2. Essential hypertension Continue Losartan 100mg . Add metoprolol XR 25mg . CCB avoided at this time due to patient with severe lymphedema and did not want to worsen leg swelling. F/U in 4 weeks.   3. Severe episode of recurrent major depressive disorder, without psychotic features ([place]) See above medical treatment plan for # 1.  - hydrOXYzine (ATARAX/VISTARIL) 25 MG tablet; Take 1 tablet (25 mg total) by mouth 3 (three) times daily as needed for anxiety.  Dispense: 90 tablet; Refill: 0   Return in about 4 weeks (around 04/12/2020) for GAD, depression.      Reynolds Bowl, PA-C, have reviewed all documentation for this visit. The documentation on 03/25/20 for the exam, diagnosis, procedures, and orders are all accurate and complete.   Rubye Beach  [facility] [phone] (phone) [phone] (fax)  [place]

## 2020-03-29 ENCOUNTER — Encounter: Payer: Self-pay | Admitting: Physician Assistant

## 2020-04-02 IMAGING — MR MRI LUMBAR SPINE WITHOUT CONTRAST
4 of 5 series · 29 of 48 positions shown · non-contrast
Comparison: Lumbar spine radiographs 03/25/2018

CLINICAL DATA: Low back pain. Bilateral leg pain to the knees and
hips with burning. Bilateral sciatica.

EXAM:
MRI LUMBAR SPINE WITHOUT CONTRAST
TECHNIQUE: Multiplanar, multisequence MR imaging of the lumbar spine was
performed. No intravenous contrast was administered.

[Series 5: T2 · sagittal · 4.0mm · 0.81mm/px · 8 of 17 slices shown (1 of 2)]
[im 1/17]
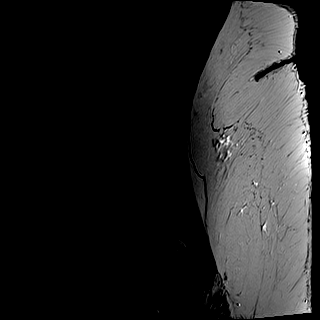
[im 3/17]
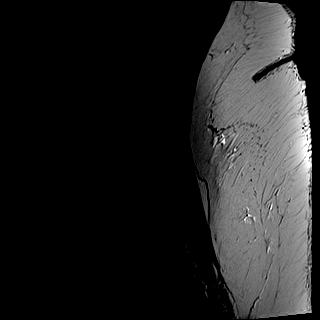
[im 5/17]
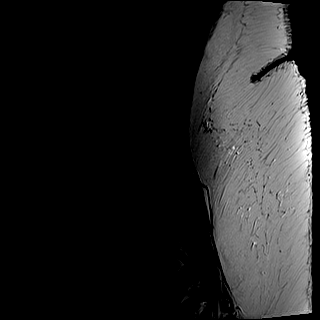
[im 7/17]
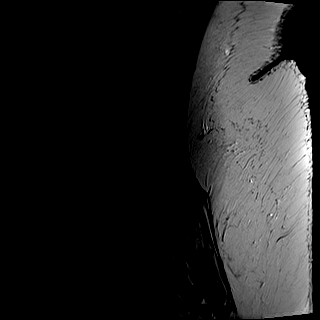
[im 10/17]
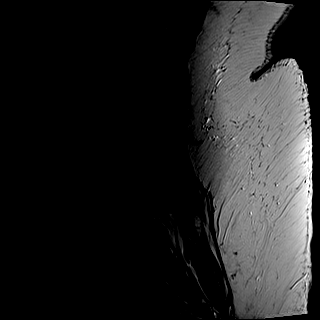
[im 12/17]
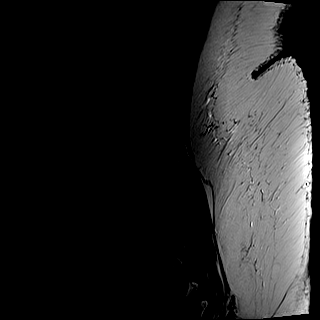
[im 14/17]
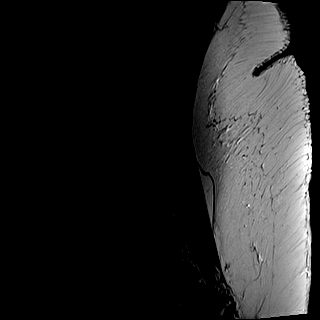
[im 17/17]
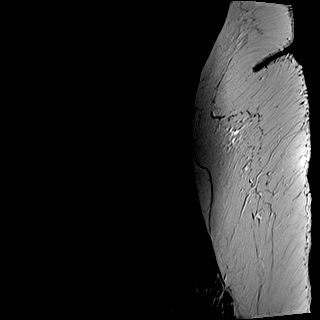

[Series 6: T1 · sagittal · 4.0mm · 0.81mm/px · 7 of 17 slices shown (1 of 2)]
[im 1/17]
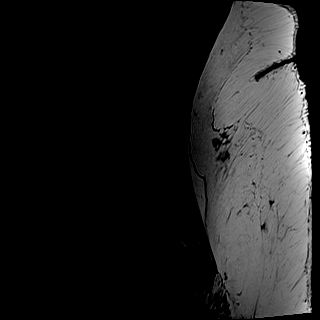
[im 3/17]
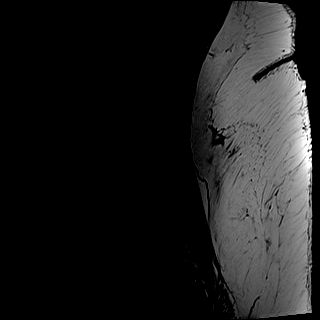
[im 6/17]
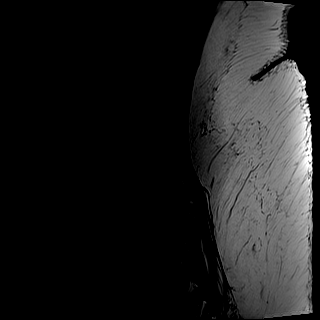
[im 9/17]
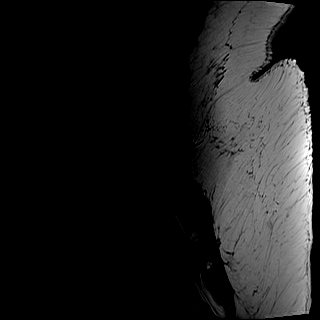
[im 11/17]
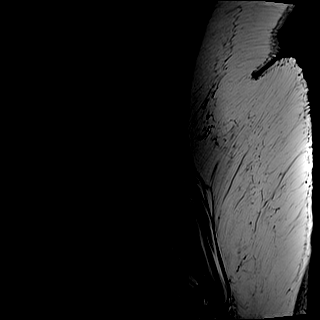
[im 14/17]
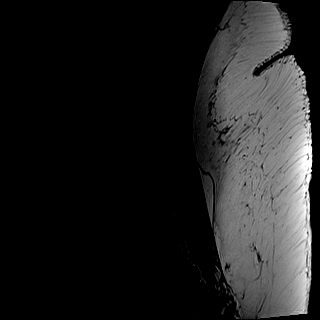
[im 17/17]
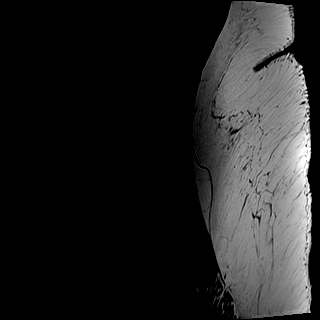

[Series 10: T2 · axial · 4.0mm · 0.52mm/px · z∈[-69,+120]mm · 9 of 31 slices shown (2 of 2)]
[im 1/31]
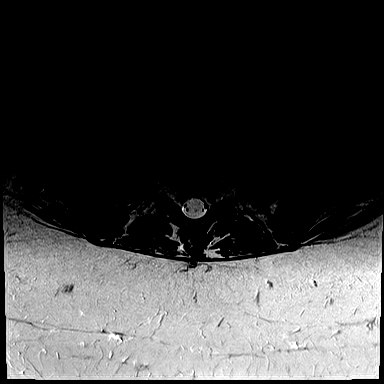
[im 6/31]
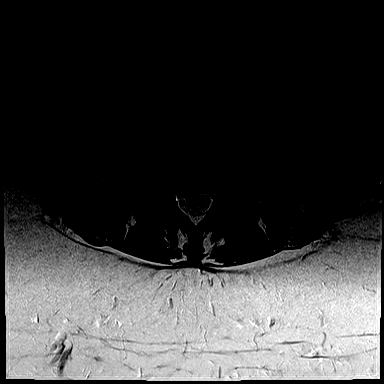
[im 11/31]
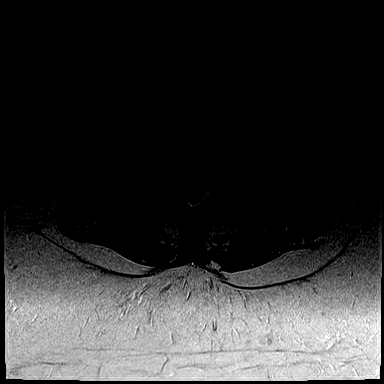
[im 13/31]
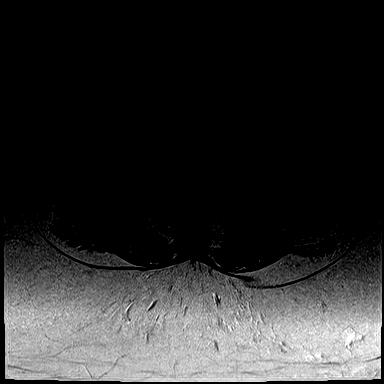
[im 16/31]
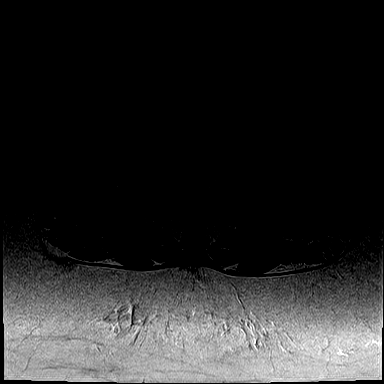
[im 18/31]
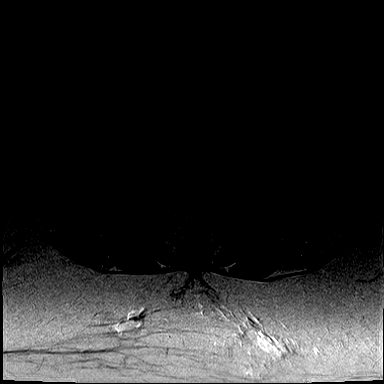
[im 21/31]
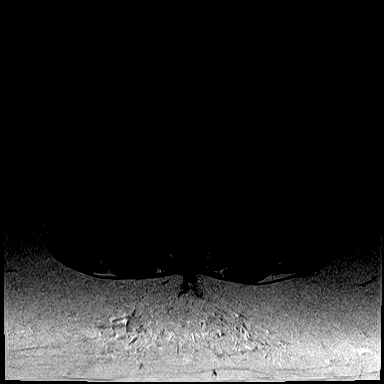
[im 26/31]
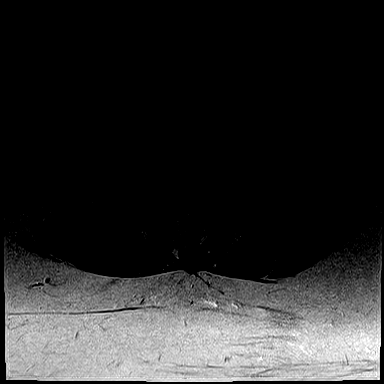
[im 31/31]
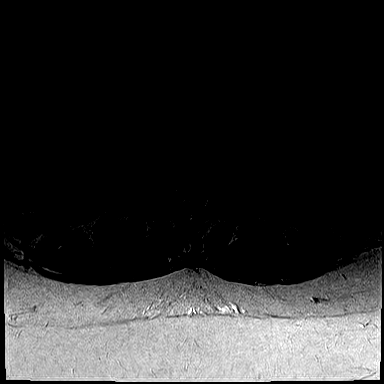

[Series 13: T1 · axial · 4.0mm · 0.31mm/px · z∈[-69,+95]mm · 5 of 31 slices shown (2 of 2)]
[im 1/31]
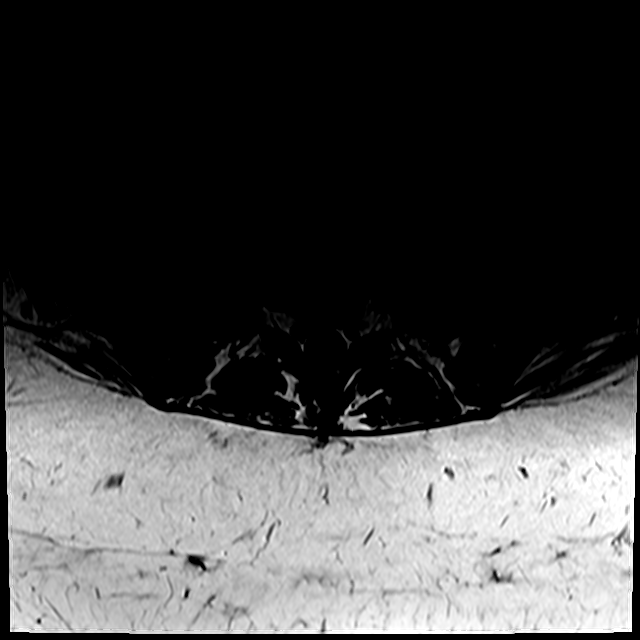
[im 6/31]
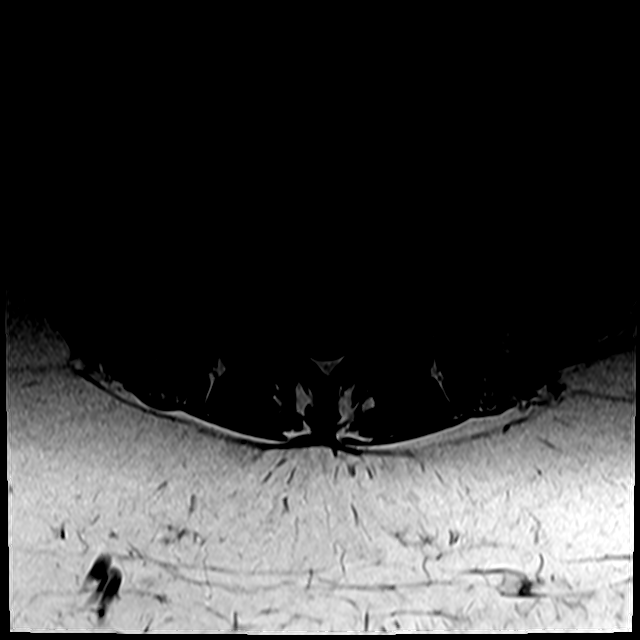
[im 11/31]
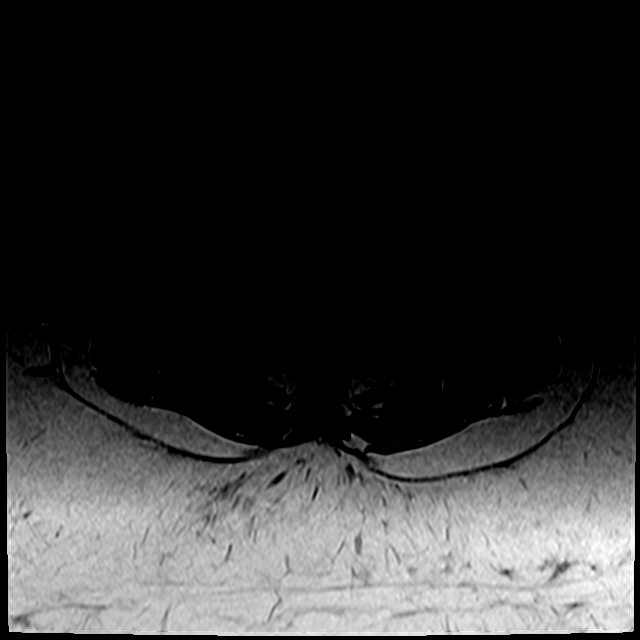
[im 16/31]
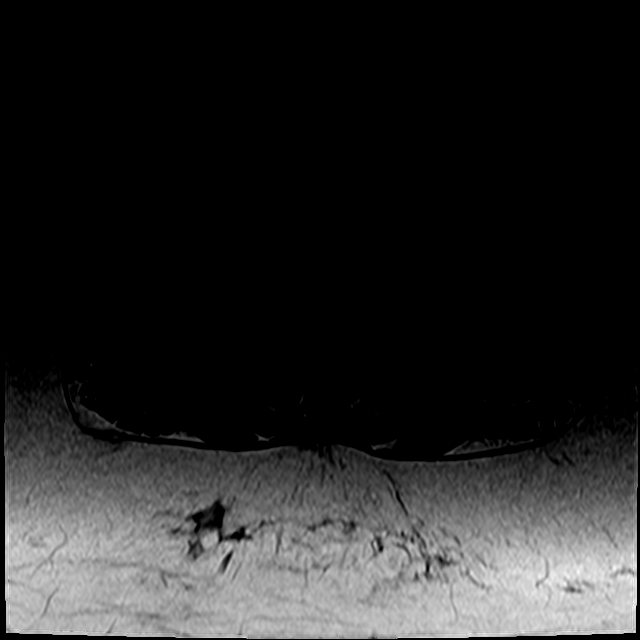
[im 26/31]
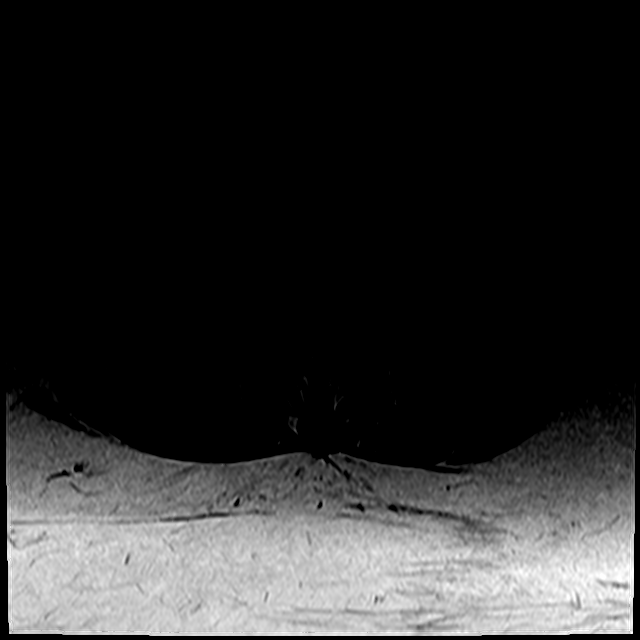

[29 of 48 positions shown; findings below may reference images not displayed]

FINDINGS: Segmentation: 5 non rib-bearing lumbar type vertebral bodies are
present. The lowest fully formed vertebral body is L5.

Alignment: Grade 1 anterolisthesis at L4-5 measures 4 mm. This is
less severe than on the plain film radiographs from last year. No
other significant listhesis is present.

Vertebrae: Chronic fatty endplate marrow changes are noted
anteriorly at L5-S1. Marrow signal and vertebral body heights are
otherwise normal.

Conus medullaris and cauda equina: Conus extends to the T12-L1
level. Conus and cauda equina appear normal.

Paraspinal and other soft tissues: Limited imaging the abdomen is
unremarkable. There is no significant adenopathy. No solid organ
lesions are present.

Disc levels:

L1-2: Negative.

L2-3: Negative.

L3-4: Mild disc bulging and facet hypertrophy is present
bilaterally. There is mild bilateral foraminal narrowing. The
central canal is patent.

L4-5: There is uncovering of a broad-based disc protrusion. Advanced
facet hypertrophy is noted bilaterally. This results an moderate
central and bilateral foraminal stenosis. In comparison with the
plain film radiographs, this likely becomes more severe when the
listhesis is worse.

L5-S1: A broad-based disc protrusion is present. Right paramedian
annular tear is noted. Disc extends into the foramina bilaterally.
Mild bilateral foraminal narrowing is present. The central canal is
patent.
IMPRESSION: 1. Moderate central and bilateral foraminal stenosis at L4-5
secondary to uncovering of broad-based disc protrusion associated
with grade 1 anterolisthesis and advanced facet hypertrophy.
Previous plain film radiographs demonstrate dynamic listhesis at
this level which likely results in more significant stenosis when
the listhesis is worse.
2. Mild bilateral foraminal narrowing at L3-4 and L5-S1 as
described.

## 2020-04-03 NOTE — Telephone Encounter (Signed)
Pt called and is asking if Fenton Malling has seen her message that she sent on 03/29/20  She would like someone to reach out to her.  CB#  [phone]

## 2020-04-04 ENCOUNTER — Encounter: Payer: Self-pay | Admitting: Physician Assistant

## 2020-04-05 ENCOUNTER — Telehealth: Payer: Self-pay

## 2020-04-05 NOTE — Telephone Encounter (Signed)
LMTCB regarding the dates? Patient sent a mychart message on 07/21 with dates. Forms have been completed with the dates that patient provided through mychart. Are this dates wrong?

## 2020-04-05 NOTE — Telephone Encounter (Signed)
Copied from [place] [id number]. Topic: General - Inquiry >> Apr 05, 2020  9:38 AM Mathis Bud wrote: Reason for CRM: Patient is requesting a call back from office her short term disability.  Patient would like to make sure dates are correct. Call back [phone]

## 2020-04-05 NOTE — Telephone Encounter (Signed)
Completed and faxed.

## 2020-04-12 ENCOUNTER — Encounter: Payer: Self-pay | Admitting: Physician Assistant

## 2020-04-12 ENCOUNTER — Other Ambulatory Visit: Payer: Self-pay

## 2020-04-12 ENCOUNTER — Ambulatory Visit (INDEPENDENT_AMBULATORY_CARE_PROVIDER_SITE_OTHER): Payer: Managed Care, Other (non HMO) | Admitting: Physician Assistant

## 2020-04-12 VITALS — BP 183/94 | HR 69 | Temp 98.5°F | Resp 16 | Wt 310.0 lb

## 2020-04-12 DIAGNOSIS — M5416 Radiculopathy, lumbar region: Secondary | ICD-10-CM | POA: Diagnosis not present

## 2020-04-12 DIAGNOSIS — F3341 Major depressive disorder, recurrent, in partial remission: Secondary | ICD-10-CM

## 2020-04-12 DIAGNOSIS — M5442 Lumbago with sciatica, left side: Secondary | ICD-10-CM | POA: Diagnosis not present

## 2020-04-12 DIAGNOSIS — F411 Generalized anxiety disorder: Secondary | ICD-10-CM | POA: Diagnosis not present

## 2020-04-12 DIAGNOSIS — G8929 Other chronic pain: Secondary | ICD-10-CM

## 2020-04-12 MED ORDER — FLUOXETINE HCL 20 MG PO CAPS: 60.0000 mg | ORAL_CAPSULE | Freq: Every day | ORAL | 1 refills | Status: DC

## 2020-04-12 MED ORDER — HYDROXYZINE HCL 25 MG PO TABS: 25.0000 mg | ORAL_TABLET | Freq: Three times a day (TID) | ORAL | 0 refills | Status: DC | PRN

## 2020-04-12 MED ORDER — GABAPENTIN 100 MG PO CAPS: 100.0000 mg | ORAL_CAPSULE | Freq: Three times a day (TID) | ORAL | 3 refills | Status: DC

## 2020-04-12 MED ORDER — BUPROPION HCL ER (XL) 150 MG PO TB24: 150.0000 mg | ORAL_TABLET | Freq: Every day | ORAL | 1 refills | Status: DC

## 2020-04-12 NOTE — Progress Notes (Signed)
Established patient visit   Patient: Ashley Mccullough   DOB: 1961/04/23   59 y.o. Female  MRN: [id number] Visit Date: 04/12/2020  Today's healthcare provider: Mar Daring, PA-C   Chief Complaint  Patient presents with  . Follow-up   Subjective    HPI  Follow up for GAD and Depression  The patient was last seen for this 4 weeks ago. Changes made at last visit include Will try Hydroxyzine, Continue Fluoxetine 60mg  and wellbutrin XL 150mg .   She reports excellent compliance with treatment. She feels that condition is Improved. She is not having side effects. Reports that she only take the Hydroxyzine twice a week, but it is helpful when she takes it.  ----------------------------------------------------------------------------------------- Follow up for Essential Hypertension  The patient was last seen for this 4 weeks ago. Changes made at last visit include Continue Losartan 100mg . Add metoprolol XR 25mg . CCB avoided at this time due to patient with severe lymphedema and did not want to worsen leg swelling.  She reports excellent compliance with treatment.  BP readings at home are 150's/80's  ----------------------------------------------------------------------------------------- Back Pain:Patient with c/o worsening back pain. Reports that the pain radiates to her leg. She is due for a [facility] injection tomorrow at [facility].  Patient Active Problem List   Diagnosis Date Noted  . Lumbar radiculitis 11/18/2019  . Spinal stenosis of lumbar region with neurogenic claudication 11/18/2019  . DDD (degenerative disc disease), lumbosacral 11/18/2019  . Acute cholecystitis   . Cholecystitis, acute 01/19/2019  . Mild episode of recurrent major depressive disorder ([place]) 10/26/2018  . BMI 50.0-59.9, adult ([place]) 07/01/2017  . Morbid obesity due to excess calories ([place]) 12/24/2015  . Allergic rhinitis 05/29/2015  . Anxiety, generalized 05/29/2015  . D  (diarrhea) 05/29/2015  . Avitaminosis D 05/29/2015  . Borderline diabetes 05/29/2015  . Fatigue 05/29/2015  . Depression 03/15/2015  . Hypertension 03/15/2015  . Cannot sleep 11/13/2009  . Carbuncle and furuncle 03/13/2008  . Congenital pes planus 03/13/2008  . CD (contact dermatitis) 04/29/2007  . Hypercholesterolemia without hypertriglyceridemia 09/16/2006  . Arthropathia 08/11/2006  . Clinical depression 08/11/2006   Past Medical History:  Diagnosis Date  . Allergy   . Anxiety   . Depression   . Hyperlipidemia   . Hypertension        Medications: Outpatient Medications Prior to Visit  Medication Sig  . acetaminophen (TYLENOL) 650 MG CR tablet Take 650-1,300 mg by mouth at bedtime. As needed for arthritis.  Marland Kitchen buPROPion (WELLBUTRIN XL) 150 MG 24 hr tablet TAKE 1 TABLET BY MOUTH EVERY DAY  . Cetirizine HCl (ZYRTEC ALLERGY) 10 MG CAPS Take by mouth.  . Cholecalciferol (VITAMIN D3) 5000 units TABS Take 5,000 tablets by mouth daily.  . COLLAGEN PO Take by mouth.  . Cyanocobalamin (VITAMIN B-12) 5000 MCG SUBL Place 5,000 mcg under the tongue daily.  Marland Kitchen FLUoxetine (PROZAC) 20 MG capsule TAKE 3 CAPSULES BY MOUTH EVERY DAY  . hydrOXYzine (ATARAX/VISTARIL) 25 MG tablet Take 1 tablet (25 mg total) by mouth 3 (three) times daily as needed for anxiety.  Marland Kitchen losartan (COZAAR) 100 MG tablet Take 1 tablet (100 mg total) by mouth daily.  . meloxicam (MOBIC) 15 MG tablet TAKE 1 TABLET(15 MG) BY MOUTH DAILY  . metaxalone (SKELAXIN) 800 MG tablet Take 0.5-1 tablets (400-800 mg total) by mouth 3 (three) times daily as needed for muscle spasms.  . metoprolol succinate (TOPROL-XL) 25 MG 24 hr tablet TAKE 1 TABLET(25 MG) BY MOUTH DAILY  .  montelukast (SINGULAIR) 10 MG tablet TAKE 1 TABLET BY MOUTH DAILY  . Omega-3 Fatty Acids (FISH OIL PO) Take by mouth.  . traMADol (ULTRAM) 50 MG tablet 1/2-1 po bid prn   No facility-administered medications prior to visit.    Review of Systems    Constitutional: Negative.   Respiratory: Negative for chest tightness and shortness of breath.   Cardiovascular: Negative for chest pain, palpitations and leg swelling.  Musculoskeletal: Positive for back pain.    Last CBC Lab Results  Component Value Date   WBC 6.9 11/25/2019   HGB 12.1 11/25/2019   HCT 37.3 11/25/2019   MCV 87 11/25/2019   MCH 28.1 11/25/2019   RDW 14.1 11/25/2019   PLT 295 53/97/6734   Last metabolic panel Lab Results  Component Value Date   GLUCOSE 92 11/25/2019   NA 138 11/25/2019   K 4.7 11/25/2019   CL 104 11/25/2019   CO2 22 11/25/2019   BUN 19 11/25/2019   CREATININE 1.15 (H) 11/25/2019   GFRNONAA 53 (L) 11/25/2019   GFRAA 61 11/25/2019   CALCIUM 9.6 11/25/2019   PHOS 3.7 01/23/2019   PROT 7.2 11/25/2019   ALBUMIN 4.1 11/25/2019   LABGLOB 3.1 11/25/2019   AGRATIO 1.3 11/25/2019   BILITOT 0.2 11/25/2019   ALKPHOS 126 (H) 11/25/2019   AST 13 11/25/2019   ALT 12 11/25/2019   ANIONGAP 11 01/23/2019      Objective    BP (!) 183/94 (BP Location: Left Wrist, Patient Position: Sitting, Cuff Size: Normal)   Pulse 69   Temp 98.5 F (36.9 C) (Oral)   Resp 16   Wt (!) 310 lb (140.6 kg)   BMI 54.91 kg/m  BP Readings from Last 3 Encounters:  04/12/20 (!) 183/94  03/15/20 (!) 162/81  02/10/20 (!) 172/83   Wt Readings from Last 3 Encounters:  04/12/20 (!) 310 lb (140.6 kg)  03/15/20 (!) 320 lb 6.4 oz (145.3 kg)  02/10/20 (!) 321 lb (145.6 kg)      Physical Exam Vitals reviewed.  Constitutional:      General: She is not in acute distress.    Appearance: Normal appearance. She is well-developed. She is obese. She is not ill-appearing or diaphoretic.  Cardiovascular:     Rate and Rhythm: Normal rate and regular rhythm.     Heart sounds: Normal heart sounds. No murmur heard.  No friction rub. No gallop.   Pulmonary:     Effort: Pulmonary effort is normal. No respiratory distress.     Breath sounds: Normal breath sounds. No wheezing or  rales.  Musculoskeletal:     Cervical back: Normal range of motion and neck supple.  Neurological:     Mental Status: She is alert.  Psychiatric:        Mood and Affect: Mood normal.        Thought Content: Thought content normal.      No results found for any visits on 04/12/20.  Assessment & Plan     1. Chronic midline low back pain with left-sided sciatica Will add gabapentin as below to see if this can help lessen the nerve pain she is having. Scheduled for [facility] tomorrow. Will f/u in 3-4 weeks to see how she is doing, recheck BP and see if gabapentin needs to be increased.  - gabapentin (NEURONTIN) 100 MG capsule; Take 1 capsule (100 mg total) by mouth 3 (three) times daily.  Dispense: 90 capsule; Refill: 3  2. Lumbar radiculitis See above medical  treatment plan. - gabapentin (NEURONTIN) 100 MG capsule; Take 1 capsule (100 mg total) by mouth 3 (three) times daily.  Dispense: 90 capsule; Refill: 3  3. Anxiety, generalized Improved. Diagnosis pulled for medication refill. Continue current medical treatment plan. - buPROPion (WELLBUTRIN XL) 150 MG 24 hr tablet; Take 1 tablet (150 mg total) by mouth daily.  Dispense: 90 tablet; Refill: 1 - hydrOXYzine (ATARAX/VISTARIL) 25 MG tablet; Take 1 tablet (25 mg total) by mouth 3 (three) times daily as needed for anxiety.  Dispense: 90 tablet; Refill: 0  4. Recurrent major depressive disorder, in partial remission ([facility]) Stable. Diagnosis pulled for medication refill. Continue current medical treatment plan. - buPROPion (WELLBUTRIN XL) 150 MG 24 hr tablet; Take 1 tablet (150 mg total) by mouth daily.  Dispense: 90 tablet; Refill: 1 - FLUoxetine (PROZAC) 20 MG capsule; Take 3 capsules (60 mg total) by mouth daily.  Dispense: 270 capsule; Refill: 1   No follow-ups on file.      Reynolds Bowl, PA-C, have reviewed all documentation for this visit. The documentation on 04/17/20 for the exam, diagnosis, procedures, and orders are all  accurate and complete.   Rubye Beach  [facility] [phone] (phone) [phone] (fax)  [place]

## 2020-04-12 NOTE — Patient Instructions (Signed)

## 2020-05-01 NOTE — Progress Notes (Signed)
Established patient visit   Patient: Ashley Mccullough   DOB: Dec 07, 1960   59 y.o. Female  MRN: [id number] Visit Date: 05/03/2020  Today's healthcare provider: Mar Daring, PA-C   Chief Complaint  Patient presents with   Cyst   Subjective    HPI  Patient coming with c/o boil in groin area.Reports that it has drained. Is better but she still would like to get treated. Does have history of having larger boils requiring months long wound dressing and frequent monitoring.  Also reports her back pain is not improving and now having knee pain as well. She is seeing Dr. Sharlet Salina at the beginning of September for further evaluation of her knee pain and possible new ESI.  Patient Active Problem List   Diagnosis Date Noted   Lumbar radiculitis 11/18/2019   Spinal stenosis of lumbar region with neurogenic claudication 11/18/2019   DDD (degenerative disc disease), lumbosacral 11/18/2019   Mild episode of recurrent major depressive disorder ([place]) 10/26/2018   BMI 50.0-59.9, adult ([place]) 07/01/2017   Morbid obesity due to excess calories ([place]) 12/24/2015   Allergic rhinitis 05/29/2015   Anxiety, generalized 05/29/2015   D (diarrhea) 05/29/2015   Avitaminosis D 05/29/2015   Borderline diabetes 05/29/2015   Fatigue 05/29/2015   Depression 03/15/2015   Hypertension 03/15/2015   Cannot sleep 11/13/2009   Carbuncle and furuncle 03/13/2008   Congenital pes planus 03/13/2008   CD (contact dermatitis) 04/29/2007   Hypercholesterolemia without hypertriglyceridemia 09/16/2006   Arthropathia 08/11/2006   Clinical depression 08/11/2006   Past Medical History:  Diagnosis Date   Allergy    Anxiety    Depression    Hyperlipidemia    Hypertension        Medications: Outpatient Medications Prior to Visit  Medication Sig   acetaminophen (TYLENOL) 650 MG CR tablet Take 650-1,300 mg by mouth at bedtime. As needed for arthritis.   buPROPion  (WELLBUTRIN XL) 150 MG 24 hr tablet Take 1 tablet (150 mg total) by mouth daily.   Cetirizine HCl (ZYRTEC ALLERGY) 10 MG CAPS Take by mouth.   Cholecalciferol (VITAMIN D3) 5000 units TABS Take 5,000 tablets by mouth daily.   COLLAGEN PO Take by mouth.   Cyanocobalamin (VITAMIN B-12) 5000 MCG SUBL Place 5,000 mcg under the tongue daily.   FLUoxetine (PROZAC) 20 MG capsule Take 3 capsules (60 mg total) by mouth daily.   gabapentin (NEURONTIN) 100 MG capsule Take 1 capsule (100 mg total) by mouth 3 (three) times daily.   hydrOXYzine (ATARAX/VISTARIL) 25 MG tablet Take 1 tablet (25 mg total) by mouth 3 (three) times daily as needed for anxiety.   losartan (COZAAR) 100 MG tablet Take 1 tablet (100 mg total) by mouth daily.   meloxicam (MOBIC) 15 MG tablet TAKE 1 TABLET(15 MG) BY MOUTH DAILY   metaxalone (SKELAXIN) 800 MG tablet Take 0.5-1 tablets (400-800 mg total) by mouth 3 (three) times daily as needed for muscle spasms.   metoprolol succinate (TOPROL-XL) 25 MG 24 hr tablet TAKE 1 TABLET(25 MG) BY MOUTH DAILY   montelukast (SINGULAIR) 10 MG tablet TAKE 1 TABLET BY MOUTH DAILY   Omega-3 Fatty Acids (FISH OIL PO) Take by mouth.   traMADol (ULTRAM) 50 MG tablet 1/2-1 po bid prn   No facility-administered medications prior to visit.    Review of Systems  Constitutional: Negative.   Respiratory: Negative.   Cardiovascular: Negative.   Musculoskeletal: Positive for arthralgias, back pain, gait problem, joint swelling and myalgias.  Skin:  Positive for wound.  Neurological: Positive for weakness and numbness.    Last CBC Lab Results  Component Value Date   WBC 6.9 11/25/2019   HGB 12.1 11/25/2019   HCT 37.3 11/25/2019   MCV 87 11/25/2019   MCH 28.1 11/25/2019   RDW 14.1 11/25/2019   PLT 295 30/86/5784   Last metabolic panel Lab Results  Component Value Date   GLUCOSE 92 11/25/2019   NA 138 11/25/2019   K 4.7 11/25/2019   CL 104 11/25/2019   CO2 22 11/25/2019   BUN  19 11/25/2019   CREATININE 1.15 (H) 11/25/2019   GFRNONAA 53 (L) 11/25/2019   GFRAA 61 11/25/2019   CALCIUM 9.6 11/25/2019   PHOS 3.7 01/23/2019   PROT 7.2 11/25/2019   ALBUMIN 4.1 11/25/2019   LABGLOB 3.1 11/25/2019   AGRATIO 1.3 11/25/2019   BILITOT 0.2 11/25/2019   ALKPHOS 126 (H) 11/25/2019   AST 13 11/25/2019   ALT 12 11/25/2019   ANIONGAP 11 01/23/2019      Objective    BP (!) 176/88 (BP Location: Left Wrist, Patient Position: Sitting, Cuff Size: Large) Comment: at home this AM 176/77   Pulse (!) 58    Temp 98.5 F (36.9 C) (Oral)    Resp 16    Wt (!) 314 lb (142.4 kg)    BMI 55.62 kg/m  BP Readings from Last 3 Encounters:  05/03/20 (!) 176/88  04/12/20 (!) 183/94  03/15/20 (!) 162/81   Wt Readings from Last 3 Encounters:  05/03/20 (!) 314 lb (142.4 kg)  04/12/20 (!) 310 lb (140.6 kg)  03/15/20 (!) 320 lb 6.4 oz (145.3 kg)      Physical Exam Vitals reviewed.  Constitutional:      General: She is not in acute distress.    Appearance: Normal appearance. She is well-developed. She is obese. She is not ill-appearing or diaphoretic.  Cardiovascular:     Rate and Rhythm: Normal rate and regular rhythm.     Heart sounds: Normal heart sounds. No murmur heard.  No friction rub. No gallop.   Pulmonary:     Effort: Pulmonary effort is normal. No respiratory distress.     Breath sounds: Normal breath sounds. No wheezing or rales.  Musculoskeletal:     Cervical back: Normal range of motion and neck supple.  Neurological:     Mental Status: She is alert.     Gait: Gait abnormal (antalgic with cane).       No results found for any visits on 05/03/20.  Assessment & Plan     1. Boil Draining on its own. Patient declined exam to visually evaluate. Continue warm compresses. Augmentin provided to cover infection empirically.  - amoxicillin-clavulanate (AUGMENTIN) 875-125 MG tablet; Take 1 tablet by mouth 2 (two) times daily.  Dispense: 20 tablet; Refill: 0  2.  Essential hypertension Elevated today. Will add HCTZ as below. Continue Losartan 100mg  and metoprolol XR 25mg  (dose dependent on HR). F/U in 4 weeks.  - losartan-hydrochlorothiazide (HYZAAR) 100-25 MG tablet; Take 1 tablet by mouth daily.  Dispense: 90 tablet; Refill: 0  3. Lumbar radiculitis Worsening despite rest. Has f/u appt with Dr. Sharlet Salina on 05/17/20 and 05/23/20.  4. Spinal stenosis of lumbar region with neurogenic claudication See above medical treatment plan.   No follow-ups on file.      Reynolds Bowl, PA-C, have reviewed all documentation for this visit. The documentation on 05/03/20 for the exam, diagnosis, procedures, and orders are all accurate  and complete.   Rubye Beach  [facility] [phone] (phone) [phone] (fax)  [place]

## 2020-05-03 ENCOUNTER — Other Ambulatory Visit: Payer: Self-pay

## 2020-05-03 ENCOUNTER — Ambulatory Visit (INDEPENDENT_AMBULATORY_CARE_PROVIDER_SITE_OTHER): Payer: Managed Care, Other (non HMO) | Admitting: Physician Assistant

## 2020-05-03 ENCOUNTER — Encounter: Payer: Self-pay | Admitting: Physician Assistant

## 2020-05-03 VITALS — BP 176/88 | HR 58 | Temp 98.5°F | Resp 16 | Wt 314.0 lb

## 2020-05-03 DIAGNOSIS — I10 Essential (primary) hypertension: Secondary | ICD-10-CM | POA: Diagnosis not present

## 2020-05-03 DIAGNOSIS — L0292 Furuncle, unspecified: Secondary | ICD-10-CM | POA: Diagnosis not present

## 2020-05-03 DIAGNOSIS — M48062 Spinal stenosis, lumbar region with neurogenic claudication: Secondary | ICD-10-CM | POA: Diagnosis not present

## 2020-05-03 DIAGNOSIS — M5416 Radiculopathy, lumbar region: Secondary | ICD-10-CM

## 2020-05-03 MED ORDER — AMOXICILLIN-POT CLAVULANATE 875-125 MG PO TABS: 1.0000 | ORAL_TABLET | Freq: Two times a day (BID) | ORAL | 0 refills | Status: DC

## 2020-05-03 MED ORDER — LOSARTAN POTASSIUM-HCTZ 100-25 MG PO TABS: 1.0000 | ORAL_TABLET | Freq: Every day | ORAL | 0 refills | Status: DC

## 2020-05-03 NOTE — Patient Instructions (Signed)
Skin Abscess  A skin abscess is an infected area on or under your skin that contains a collection of pus and other material. An abscess may also be called a furuncle, carbuncle, or boil. An abscess can occur in or on almost any part of your body. Some abscesses break open (rupture) on their own. Most continue to get worse unless they are treated. The infection can spread deeper into the body and eventually into your blood, which can make you feel ill. Treatment usually involves draining the abscess. What are the causes? An abscess occurs when germs, like bacteria, pass through your skin and cause an infection. This may be caused by:  A scrape or cut on your skin.  A puncture wound through your skin, including a needle injection or insect bite.  Blocked oil or sweat glands.  Blocked and infected hair follicles.  A cyst that forms beneath your skin (sebaceous cyst) and becomes infected. What increases the risk? This condition is more likely to develop in people who:  Have a weak body defense system (immune system).  Have diabetes.  Have dry and irritated skin.  Get frequent injections or use illegal IV drugs.  Have a foreign body in a wound, such as a splinter.  Have problems with their lymph system or veins. What are the signs or symptoms? Symptoms of this condition include:  A painful, firm bump under the skin.  A bump with pus at the top. This may break through the skin and drain. Other symptoms include:  Redness surrounding the abscess site.  Warmth.  Swelling of the lymph nodes (glands) near the abscess.  Tenderness.  A sore on the skin. How is this diagnosed? This condition may be diagnosed based on:  A physical exam.  Your medical history.  A sample of pus. This may be used to find out what is causing the infection.  Blood tests.  Imaging tests, such as an ultrasound, CT scan, or MRI. How is this treated? A small abscess that drains on its own may  not need treatment. Treatment for larger abscesses may include:  Moist heat or heat pack applied to the area several times a day.  A procedure to drain the abscess (incision and drainage).  Antibiotic medicines. For a severe abscess, you may first get antibiotics through an IV and then change to antibiotics by mouth. Follow these instructions at home: Medicines   Take over-the-counter and prescription medicines only as told by your health care provider.  If you were prescribed an antibiotic medicine, take it as told by your health care provider. Do not stop taking the antibiotic even if you start to feel better. Abscess care   If you have an abscess that has not drained, apply heat to the affected area. Use the heat source that your health care provider recommends, such as a moist heat pack or a heating pad. ? Place a towel between your skin and the heat source. ? Leave the heat on for 20-30 minutes. ? Remove the heat if your skin turns bright red. This is especially important if you are unable to feel pain, heat, or cold. You may have a greater risk of getting burned.  Follow instructions from your health care provider about how to take care of your abscess. Make sure you: ? Cover the abscess with a bandage (dressing). ? Change your dressing or gauze as told by your health care provider. ? Wash your hands with soap and water before you change the   dressing or gauze. If soap and water are not available, use hand sanitizer.  Check your abscess every day for signs of a worsening infection. Check for: ? More redness, swelling, or pain. ? More fluid or blood. ? Warmth. ? More pus or a bad smell. General instructions  To avoid spreading the infection: ? Do not share personal care items, towels, or hot tubs with others. ? Avoid making skin contact with other people.  Keep all follow-up visits as told by your health care provider. This is important. Contact a health care provider if  you have:  More redness, swelling, or pain around your abscess.  More fluid or blood coming from your abscess.  Warm skin around your abscess.  More pus or a bad smell coming from your abscess.  A fever.  Muscle aches.  Chills or a general ill feeling. Get help right away if you:  Have severe pain.  See red streaks on your skin spreading away from the abscess. Summary  A skin abscess is an infected area on or under your skin that contains a collection of pus and other material.  A small abscess that drains on its own may not need treatment.  Treatment for larger abscesses may include having a procedure to drain the abscess and taking an antibiotic. This information is not intended to replace advice given to you by your health care provider. Make sure you discuss any questions you have with your health care provider. Document Revised: 12/23/2018 Document Reviewed: 10/15/2017 Elsevier Patient Education  2020 Elsevier Inc.  

## 2020-05-23 ENCOUNTER — Encounter: Payer: Self-pay | Admitting: Physician Assistant

## 2020-05-24 ENCOUNTER — Encounter: Payer: Self-pay | Admitting: Physician Assistant

## 2020-05-29 ENCOUNTER — Encounter: Payer: Self-pay | Admitting: Physician Assistant

## 2020-06-04 ENCOUNTER — Telehealth: Payer: Self-pay

## 2020-06-04 NOTE — Telephone Encounter (Signed)
Copied from [place] [id number]. Topic: General - Other >> Jun 04, 2020 10:54 AM Oneta Rack wrote: Relation to pt: self  Call back number: [phone]   Reason for call:  [employer] faxing over short term disability forms to [phone] second time faxing please note when received. Deadline is 9/21 second extension given to patient. As per [employer] forms have been faxed and will fax again.

## 2020-06-06 ENCOUNTER — Encounter: Payer: Self-pay | Admitting: Physician Assistant

## 2020-06-06 NOTE — Telephone Encounter (Signed)
We did this for her right? Or is it the one they have been sending to Dr.chasnis

## 2020-06-06 NOTE — Telephone Encounter (Signed)
These are the ones I think they are sending to Dr. Sharlet Salina as I have not received them.

## 2020-06-13 ENCOUNTER — Other Ambulatory Visit: Payer: Self-pay | Admitting: Physician Assistant

## 2020-06-13 DIAGNOSIS — G8929 Other chronic pain: Secondary | ICD-10-CM

## 2020-06-13 DIAGNOSIS — M5416 Radiculopathy, lumbar region: Secondary | ICD-10-CM

## 2020-06-13 MED ORDER — GABAPENTIN 100 MG PO CAPS: 100.0000 mg | ORAL_CAPSULE | Freq: Three times a day (TID) | ORAL | 1 refills | Status: DC

## 2020-06-13 NOTE — Telephone Encounter (Signed)
[facility] sent fax stating that insurance requires a 90 day supply of gabapentin (NEURONTIN) 100 MG capsule and they are requesting a new Rx for 90 day supply. Please advise. Thanks TNP

## 2020-06-15 ENCOUNTER — Other Ambulatory Visit: Payer: Self-pay

## 2020-06-15 ENCOUNTER — Encounter: Payer: Self-pay | Admitting: Physician Assistant

## 2020-06-15 ENCOUNTER — Ambulatory Visit (INDEPENDENT_AMBULATORY_CARE_PROVIDER_SITE_OTHER): Payer: Managed Care, Other (non HMO) | Admitting: Physician Assistant

## 2020-06-15 VITALS — BP 118/90 | HR 65 | Temp 98.2°F | Resp 16 | Wt 316.0 lb

## 2020-06-15 DIAGNOSIS — I10 Essential (primary) hypertension: Secondary | ICD-10-CM | POA: Diagnosis not present

## 2020-06-15 DIAGNOSIS — F411 Generalized anxiety disorder: Secondary | ICD-10-CM | POA: Diagnosis not present

## 2020-06-15 DIAGNOSIS — Z23 Encounter for immunization: Secondary | ICD-10-CM

## 2020-06-15 DIAGNOSIS — M5416 Radiculopathy, lumbar region: Secondary | ICD-10-CM | POA: Diagnosis not present

## 2020-06-15 DIAGNOSIS — M25561 Pain in right knee: Secondary | ICD-10-CM

## 2020-06-15 DIAGNOSIS — M25562 Pain in left knee: Secondary | ICD-10-CM

## 2020-06-15 DIAGNOSIS — M48062 Spinal stenosis, lumbar region with neurogenic claudication: Secondary | ICD-10-CM

## 2020-06-15 DIAGNOSIS — G8929 Other chronic pain: Secondary | ICD-10-CM

## 2020-06-15 MED ORDER — METOPROLOL SUCCINATE ER 25 MG PO TB24: ORAL_TABLET | ORAL | 1 refills | Status: DC

## 2020-06-15 MED ORDER — LOSARTAN POTASSIUM-HCTZ 100-25 MG PO TABS: 1.0000 | ORAL_TABLET | Freq: Every day | ORAL | 1 refills | Status: DC

## 2020-06-15 NOTE — Progress Notes (Signed)
Established patient visit   Patient: Ashley Mccullough   DOB: 06/18/1961   59 y.o. Female  MRN: [id number] Visit Date: 06/15/2020  Today's healthcare provider: Mar Daring, PA-C   Chief Complaint  Patient presents with  . Follow-up   Subjective    HPI  Hypertension, follow-up  BP Readings from Last 3 Encounters:  06/15/20 118/90  05/03/20 (!) 176/88  04/12/20 (!) 183/94   Wt Readings from Last 3 Encounters:  06/15/20 (!) 316 lb (143.3 kg)  05/03/20 (!) 314 lb (142.4 kg)  04/12/20 (!) 310 lb (140.6 kg)     She was last seen for hypertension 4 weeks ago.  BP at that visit was 176/88. Management since that visit includes Will add HCTZ. Continue Losartan 100mg  and metoprolol XR 25mg  (dose dependent on HR). F/U in 4 weeks  She reports excellent compliance with treatment. She is not having side effects.  She is following a Low Sodium diet. She is not exercising. She does not smoke.  Outside blood pressures are 237/100's-123/70. Symptoms: No chest pain No chest pressure  No palpitations No syncope  No dyspnea No orthopnea  No paroxysmal nocturnal dyspnea No lower extremity edema   Pertinent labs: Lab Results  Component Value Date   CHOL 245 (H) 11/25/2019   HDL 89 11/25/2019   LDLCALC 147 (H) 11/25/2019   TRIG 53 11/25/2019   CHOLHDL 2.8 07/02/2018   Lab Results  Component Value Date   NA 138 11/25/2019   K 4.7 11/25/2019   CREATININE 1.15 (H) 11/25/2019   GFRNONAA 53 (L) 11/25/2019   GFRAA 61 11/25/2019   GLUCOSE 92 11/25/2019     The 10-year ASCVD risk score Mikey Bussing DC Jr., et al., 2013) is: 4.4%   ---------------------------------------------------------------------------------------------------  Patient Active Problem List   Diagnosis Date Noted  . Chronic pain of both knees 06/21/2020  . Lumbar radiculitis 11/18/2019  . Spinal stenosis of lumbar region with neurogenic claudication 11/18/2019  . DDD (degenerative disc disease),  lumbosacral 11/18/2019  . Mild episode of recurrent major depressive disorder ([place]) 10/26/2018  . BMI 50.0-59.9, adult ([place]) 07/01/2017  . Morbid obesity due to excess calories ([place]) 12/24/2015  . Allergic rhinitis 05/29/2015  . Anxiety, generalized 05/29/2015  . D (diarrhea) 05/29/2015  . Avitaminosis D 05/29/2015  . Borderline diabetes 05/29/2015  . Fatigue 05/29/2015  . Depression 03/15/2015  . Hypertension 03/15/2015  . Cannot sleep 11/13/2009  . Carbuncle and furuncle 03/13/2008  . Congenital pes planus 03/13/2008  . CD (contact dermatitis) 04/29/2007  . Hypercholesterolemia without hypertriglyceridemia 09/16/2006  . Arthropathia 08/11/2006  . Clinical depression 08/11/2006   Past Medical History:  Diagnosis Date  . Allergy   . Anxiety   . Depression   . Hyperlipidemia   . Hypertension        Medications: Outpatient Medications Prior to Visit  Medication Sig  . acetaminophen (TYLENOL) 650 MG CR tablet Take 650-1,300 mg by mouth at bedtime. As needed for arthritis.  Marland Kitchen buPROPion (WELLBUTRIN XL) 150 MG 24 hr tablet Take 1 tablet (150 mg total) by mouth daily.  . Cetirizine HCl (ZYRTEC ALLERGY) 10 MG CAPS Take by mouth.  . Cholecalciferol (VITAMIN D3) 5000 units TABS Take 5,000 tablets by mouth daily.  . COLLAGEN PO Take by mouth.  . Cyanocobalamin (VITAMIN B-12) 5000 MCG SUBL Place 5,000 mcg under the tongue daily.  Marland Kitchen FLUoxetine (PROZAC) 20 MG capsule Take 3 capsules (60 mg total) by mouth daily.  Marland Kitchen gabapentin (NEURONTIN) 100  MG capsule Take 1 capsule (100 mg total) by mouth 3 (three) times daily.  . hydrOXYzine (ATARAX/VISTARIL) 25 MG tablet Take 1 tablet (25 mg total) by mouth 3 (three) times daily as needed for anxiety.  . meloxicam (MOBIC) 15 MG tablet TAKE 1 TABLET(15 MG) BY MOUTH DAILY  . metaxalone (SKELAXIN) 800 MG tablet Take 0.5-1 tablets (400-800 mg total) by mouth 3 (three) times daily as needed for muscle spasms.  . montelukast (SINGULAIR) 10 MG tablet TAKE  1 TABLET BY MOUTH DAILY  . Omega-3 Fatty Acids (FISH OIL PO) Take by mouth.  . traMADol (ULTRAM) 50 MG tablet 1/2-1 po bid prn  . [DISCONTINUED] amoxicillin-clavulanate (AUGMENTIN) 875-125 MG tablet Take 1 tablet by mouth 2 (two) times daily.  . [DISCONTINUED] losartan-hydrochlorothiazide (HYZAAR) 100-25 MG tablet Take 1 tablet by mouth daily.  . [DISCONTINUED] metoprolol succinate (TOPROL-XL) 25 MG 24 hr tablet TAKE 1 TABLET(25 MG) BY MOUTH DAILY   No facility-administered medications prior to visit.    Review of Systems  Constitutional: Positive for fatigue.  Respiratory: Negative.   Cardiovascular: Negative.   Musculoskeletal: Positive for arthralgias, back pain, gait problem, joint swelling and myalgias.  Neurological: Positive for weakness and numbness.    Last CBC Lab Results  Component Value Date   WBC 6.9 11/25/2019   HGB 12.1 11/25/2019   HCT 37.3 11/25/2019   MCV 87 11/25/2019   MCH 28.1 11/25/2019   RDW 14.1 11/25/2019   PLT 295 46/65/9935   Last metabolic panel Lab Results  Component Value Date   GLUCOSE 92 11/25/2019   NA 138 11/25/2019   K 4.7 11/25/2019   CL 104 11/25/2019   CO2 22 11/25/2019   BUN 19 11/25/2019   CREATININE 1.15 (H) 11/25/2019   GFRNONAA 53 (L) 11/25/2019   GFRAA 61 11/25/2019   CALCIUM 9.6 11/25/2019   PHOS 3.7 01/23/2019   PROT 7.2 11/25/2019   ALBUMIN 4.1 11/25/2019   LABGLOB 3.1 11/25/2019   AGRATIO 1.3 11/25/2019   BILITOT 0.2 11/25/2019   ALKPHOS 126 (H) 11/25/2019   AST 13 11/25/2019   ALT 12 11/25/2019   ANIONGAP 11 01/23/2019      Objective    BP 118/90 (BP Location: Left Wrist, Patient Position: Sitting, Cuff Size: Large)   Pulse 65   Temp 98.2 F (36.8 C) (Oral)   Resp 16   Wt (!) 316 lb (143.3 kg)   BMI 55.98 kg/m  BP Readings from Last 3 Encounters:  06/15/20 118/90  05/03/20 (!) 176/88  04/12/20 (!) 183/94   Wt Readings from Last 3 Encounters:  06/15/20 (!) 316 lb (143.3 kg)  05/03/20 (!) 314 lb  (142.4 kg)  04/12/20 (!) 310 lb (140.6 kg)      Physical Exam Vitals reviewed.  Constitutional:      General: She is not in acute distress.    Appearance: Normal appearance. She is well-developed and well-groomed. She is morbidly obese. She is not ill-appearing or diaphoretic.  Cardiovascular:     Rate and Rhythm: Normal rate and regular rhythm.     Heart sounds: Normal heart sounds. No murmur heard.  No friction rub. No gallop.   Pulmonary:     Effort: Pulmonary effort is normal. No respiratory distress.     Breath sounds: Normal breath sounds. No wheezing or rales.  Musculoskeletal:     Cervical back: Normal range of motion and neck supple.  Neurological:     Mental Status: She is alert.  Psychiatric:  Behavior: Behavior is cooperative.       No results found for any visits on 06/15/20.  Assessment & Plan     1. Essential hypertension BP much improved. Continue current medications below.  - losartan-hydrochlorothiazide (HYZAAR) 100-25 MG tablet; Take 1 tablet by mouth daily.  Dispense: 90 tablet; Refill: 1 - metoprolol succinate (TOPROL-XL) 25 MG 24 hr tablet; TAKE 1 TABLET(25 MG) BY MOUTH DAILY  Dispense: 90 tablet; Refill: 1  2. Anxiety, generalized Stable. Continue Wellbutrin XL 150mg , Fluoxetine 60mg  daily, hydroxyzine 25mg  TID prn for anxiety.  3. Need for influenza vaccination Flu vaccine given today without complication. Patient sat upright for 15 minutes to check for adverse reaction before being released. - Flu Vaccine QUAD 36+ mos IM  4. Lumbar radiculitis Stable at this time. Followed by Dr. Sharlet Salina, physical medicine and rehab. Getting ESI.   5. Spinal stenosis of lumbar region with neurogenic claudication See above medical treatment plan.  6. Chronic pain of both knees L>R Referred to [facility]. Going to try [place] injections.    Return in about 3 months (around 09/15/2020).      Reynolds Bowl, PA-C, have reviewed all  documentation for this visit. The documentation on 06/21/20 for the exam, diagnosis, procedures, and orders are all accurate and complete.    Rubye Beach  [facility] [phone] (phone) [phone] (fax)  [place]

## 2020-06-18 ENCOUNTER — Telehealth: Payer: Self-pay

## 2020-06-18 NOTE — Telephone Encounter (Signed)
Received 1 page form from [employer]. Form placed in Jenni's box. Please advise. Thanks TNP

## 2020-06-19 NOTE — Telephone Encounter (Signed)
FYI

## 2020-06-20 NOTE — Telephone Encounter (Signed)
Form completed for patient. Ok to print 05/03/20 and 06/15/20 notes to send with.  Will give to [place].

## 2020-06-21 ENCOUNTER — Encounter: Payer: Self-pay | Admitting: Physician Assistant

## 2020-06-21 DIAGNOSIS — G8929 Other chronic pain: Secondary | ICD-10-CM | POA: Insufficient documentation

## 2020-06-21 DIAGNOSIS — M25561 Pain in right knee: Secondary | ICD-10-CM | POA: Insufficient documentation

## 2020-06-26 NOTE — Telephone Encounter (Signed)
I have it sorry.... I'll bring it to you now  Thanks,   -Mickel Baas

## 2020-06-27 NOTE — Telephone Encounter (Signed)
Completed form faxed to [employer]. TNP

## 2020-07-11 ENCOUNTER — Encounter: Payer: Self-pay | Admitting: Physician Assistant

## 2020-07-16 ENCOUNTER — Telehealth: Payer: Self-pay

## 2020-07-16 NOTE — Telephone Encounter (Signed)
Received forms from [employer]. (Provider Statement & Accomodation Medical Assessment Forms). Forms placed in Jenni's box. Please advise. Thanks TNP

## 2020-07-19 ENCOUNTER — Other Ambulatory Visit: Payer: Self-pay | Admitting: Physician Assistant

## 2020-07-19 DIAGNOSIS — I10 Essential (primary) hypertension: Secondary | ICD-10-CM

## 2020-07-31 NOTE — Telephone Encounter (Signed)
completed

## 2020-08-15 ENCOUNTER — Other Ambulatory Visit: Payer: Self-pay | Admitting: Physician Assistant

## 2020-08-15 DIAGNOSIS — F411 Generalized anxiety disorder: Secondary | ICD-10-CM

## 2020-08-31 ENCOUNTER — Encounter: Payer: Self-pay | Admitting: Physician Assistant

## 2020-09-03 ENCOUNTER — Encounter: Payer: Self-pay | Admitting: Physician Assistant

## 2020-09-14 ENCOUNTER — Other Ambulatory Visit: Payer: Self-pay | Admitting: Physician Assistant

## 2020-09-14 DIAGNOSIS — F411 Generalized anxiety disorder: Secondary | ICD-10-CM

## 2020-09-19 ENCOUNTER — Encounter: Payer: Self-pay | Admitting: Physician Assistant

## 2020-09-19 ENCOUNTER — Telehealth (INDEPENDENT_AMBULATORY_CARE_PROVIDER_SITE_OTHER): Payer: Managed Care, Other (non HMO) | Admitting: Physician Assistant

## 2020-09-19 DIAGNOSIS — F411 Generalized anxiety disorder: Secondary | ICD-10-CM

## 2020-09-19 DIAGNOSIS — R7303 Prediabetes: Secondary | ICD-10-CM

## 2020-09-19 DIAGNOSIS — I10 Essential (primary) hypertension: Secondary | ICD-10-CM | POA: Diagnosis not present

## 2020-09-19 MED ORDER — HYDROXYZINE HCL 25 MG PO TABS: 25.0000 mg | ORAL_TABLET | ORAL | 1 refills | Status: DC | PRN

## 2020-09-19 NOTE — Progress Notes (Signed)
MyChart Video Visit    Virtual Visit via Video Note   This visit type was conducted due to national recommendations for restrictions regarding the COVID-19 Pandemic (e.g. social distancing) in an effort to limit this patient's exposure and mitigate transmission in our community. This patient is at least at moderate risk for complications without adequate follow up. This format is felt to be most appropriate for this patient at this time. Physical exam was limited by quality of the video and audio technology used for the visit.   Patient location: Home Provider location: Home office in [place], [place]  I discussed the limitations of evaluation and management by telemedicine and the availability of in person appointments. The patient expressed understanding and agreed to proceed.  Patient: Ashley Mccullough   DOB: [birth year]   60 y.o. Female  MRN: [id number] Visit Date: 09/19/2020  Today's healthcare provider: Mar Daring, PA-C   No chief complaint on file.  Subjective    HPI    Hypertension, follow-up  BP Readings from Last 3 Encounters:  06/15/20 118/90  05/03/20 (!) 176/88  04/12/20 (!) 183/94   Wt Readings from Last 3 Encounters:  06/15/20 (!) 316 lb (143.3 kg)  05/03/20 (!) 314 lb (142.4 kg)  04/12/20 (!) 310 lb (140.6 kg)     She was last seen for hypertension 3 months ago.  BP at that visit was 118/90. Management since that visit includes no changes.  She reports excellent compliance with treatment. She is not having side effects.  She is following a Low Sodium diet. She is exercising some. She does not smoke.  Use of agents associated with hypertension: none.   Outside blood pressures are checked occasionally.   Symptoms: No chest pain No chest pressure  No palpitations No syncope  No dyspnea No orthopnea  No paroxysmal nocturnal dyspnea No lower extremity edema   Pertinent labs: Lab Results  Component Value Date   CHOL 245 (H) 11/25/2019    HDL 89 11/25/2019   LDLCALC 147 (H) 11/25/2019   TRIG 53 11/25/2019   CHOLHDL 2.8 07/02/2018   Lab Results  Component Value Date   NA 138 11/25/2019   K 4.7 11/25/2019   CREATININE 1.15 (H) 11/25/2019   GFRNONAA 53 (L) 11/25/2019   GFRAA 61 11/25/2019   GLUCOSE 92 11/25/2019     The 10-year ASCVD risk score Mikey Bussing DC Jr., et al., 2013) is: 5%   --------------------------------------------------------------------------------------------------- Anxiety, Follow-up  She was last seen for anxiety 3 months ago. Changes made at last visit include none.   She reports excellent compliance with treatment. She reports excellent tolerance of treatment. She is not having side effects.   She feels her anxiety is severe and worsening since last visit.  Symptoms: No chest pain Yes difficulty concentrating  No dizziness No fatigue  Yes feelings of losing control No insomnia  No irritable No palpitations  Yes panic attacks Yes racing thoughts  No shortness of breath No sweating  No tremors/shakes    GAD-7 Results GAD-7 Generalized Anxiety Disorder Screening Tool 09/19/2020 03/15/2020  1. Feeling Nervous, Anxious, or on Edge 3 3  2. Not Being Able to Stop or Control Worrying 3 3  3. Worrying Too Much About Different Things 3 3  4. Trouble Relaxing 0 2  5. Being So Restless it's Hard To Sit Still 0 0  6. Becoming Easily Annoyed or Irritable 0 1  7. Feeling Afraid As If Something Awful Might Happen 2 2  Total GAD-7 Score 11 14  Difficulty At Work, Home, or Getting  Along With Others? Extremely difficult Extremely difficult    PHQ-9 Scores PHQ9 SCORE ONLY 09/19/2020 03/15/2020 07/02/2018  PHQ-9 Total Score 20 17 8     ---------------------------------------------------------------------------------------------------   Patient Active Problem List   Diagnosis Date Noted  . Chronic pain of both knees 06/21/2020  . Lumbar radiculitis 11/18/2019  . Spinal stenosis of lumbar region with  neurogenic claudication 11/18/2019  . DDD (degenerative disc disease), lumbosacral 11/18/2019  . Mild episode of recurrent major depressive disorder ([place]) 10/26/2018  . BMI 50.0-59.9, adult ([place]) 07/01/2017  . Morbid obesity due to excess calories ([place]) 12/24/2015  . Allergic rhinitis 05/29/2015  . Anxiety, generalized 05/29/2015  . D (diarrhea) 05/29/2015  . Avitaminosis D 05/29/2015  . Borderline diabetes 05/29/2015  . Fatigue 05/29/2015  . Depression 03/15/2015  . Hypertension 03/15/2015  . Cannot sleep 11/13/2009  . Carbuncle and furuncle 03/13/2008  . Congenital pes planus 03/13/2008  . CD (contact dermatitis) 04/29/2007  . Hypercholesterolemia without hypertriglyceridemia 09/16/2006  . Arthropathia 08/11/2006  . Clinical depression 08/11/2006   Past Medical History:  Diagnosis Date  . Allergy   . Anxiety   . Depression   . Hyperlipidemia   . Hypertension    Social History   Tobacco Use  . Smoking status: Never Smoker  . Smokeless tobacco: Never Used  Substance Use Topics  . Alcohol use: Not Currently  . Drug use: No   Allergies  Allergen Reactions  . Cashew Nut Oil Hives, Itching and Swelling  . Latex Itching  . Simvastatin     Other reaction(s): Unknown Memory changes Memory changes  . Sulfa Antibiotics     Other reaction(s): Unknown  . Levofloxacin Rash  . Prednisone Rash    Medications: Outpatient Medications Prior to Visit  Medication Sig  . acetaminophen (TYLENOL) 650 MG CR tablet Take 650-1,300 mg by mouth at bedtime. As needed for arthritis.  Marland Kitchen buPROPion (WELLBUTRIN XL) 150 MG 24 hr tablet Take 1 tablet (150 mg total) by mouth daily.  . Cetirizine HCl (ZYRTEC ALLERGY) 10 MG CAPS Take by mouth.  . Cholecalciferol (VITAMIN D3) 5000 units TABS Take 5,000 tablets by mouth daily.  . COLLAGEN PO Take by mouth.  . Cyanocobalamin (VITAMIN B-12) 5000 MCG SUBL Place 5,000 mcg under the tongue daily.  Marland Kitchen FLUoxetine (PROZAC) 20 MG capsule Take 3 capsules  (60 mg total) by mouth daily.  Marland Kitchen gabapentin (NEURONTIN) 100 MG capsule Take 1 capsule (100 mg total) by mouth 3 (three) times daily.  . hydrOXYzine (ATARAX/VISTARIL) 25 MG tablet TAKE 1 TABLET(25 MG) BY MOUTH THREE TIMES DAILY AS NEEDED FOR ANXIETY  . losartan-hydrochlorothiazide (HYZAAR) 100-25 MG tablet TAKE 1 TABLET BY MOUTH DAILY  . meloxicam (MOBIC) 15 MG tablet TAKE 1 TABLET(15 MG) BY MOUTH DAILY  . metaxalone (SKELAXIN) 800 MG tablet Take 0.5-1 tablets (400-800 mg total) by mouth 3 (three) times daily as needed for muscle spasms.  . metoprolol succinate (TOPROL-XL) 25 MG 24 hr tablet TAKE 1 TABLET(25 MG) BY MOUTH DAILY  . montelukast (SINGULAIR) 10 MG tablet TAKE 1 TABLET BY MOUTH DAILY  . Omega-3 Fatty Acids (FISH OIL PO) Take by mouth.  . traMADol (ULTRAM) 50 MG tablet 1/2-1 po bid prn   No facility-administered medications prior to visit.    Review of Systems  Constitutional: Negative.   Respiratory: Negative.   Cardiovascular: Negative.   Gastrointestinal: Negative.   Musculoskeletal: Positive for arthralgias, back pain, gait problem,  joint swelling and myalgias.  Neurological: Negative for dizziness, light-headedness and headaches.  Psychiatric/Behavioral: Positive for decreased concentration and dysphoric mood. Negative for sleep disturbance. The patient is nervous/anxious.       Objective    There were no vitals taken for this visit.   Physical Exam Vitals reviewed.  Constitutional:      General: She is not in acute distress.    Appearance: Normal appearance. She is well-developed and well-nourished. She is not ill-appearing.  HENT:     Head: Normocephalic and atraumatic.  Eyes:     Extraocular Movements: EOM normal.  Pulmonary:     Effort: Pulmonary effort is normal. No respiratory distress.  Musculoskeletal:     Cervical back: Normal range of motion and neck supple.  Neurological:     Mental Status: She is alert.  Psychiatric:        Mood and Affect: Mood  and affect and mood normal.        Behavior: Behavior normal.        Thought Content: Thought content normal.        Judgment: Judgment normal.       Assessment & Plan     1. Primary hypertension Home reading was elevated, but reading at another doctor's office was good at 118/90. Will await changes and continue losartan-hctz 100-25mg , metoprolol XR 25mg . Will f/u in 2-3 months.   2. Borderline diabetes Diet controlled.   3. Anxiety, generalized Increased Hydroxyzine as below to QID PRN. Continue Fluoxetine 60mg  daily and buproprion XL 150mg  daily. F/U in 2-3 months or sooner if needed.  - hydrOXYzine (ATARAX/VISTARIL) 25 MG tablet; Take 1 tablet (25 mg total) by mouth every 4 (four) hours as needed for anxiety.  Dispense: 360 tablet; Refill: 1   No follow-ups on file.     I discussed the assessment and treatment plan with the patient. The patient was provided an opportunity to ask questions and all were answered. The patient agreed with the plan and demonstrated an understanding of the instructions.   The patient was advised to call back or seek an in-person evaluation if the symptoms worsen or if the condition fails to improve as anticipated.  I provided 33 minutes of face-to-face time during this encounter via MyChart Video enabled encounter.   Reynolds Bowl, PA-C, have reviewed all documentation for this visit. The documentation on 09/19/20 for the exam, diagnosis, procedures, and orders are all accurate and complete.  [place] [facility] [phone] (phone) [phone] (fax)  [place]

## 2020-09-27 ENCOUNTER — Encounter: Payer: Self-pay | Admitting: Physician Assistant

## 2020-10-08 ENCOUNTER — Telehealth: Payer: Self-pay

## 2020-10-08 NOTE — Telephone Encounter (Signed)
Paperwork located. Addressed in [place]

## 2020-10-08 NOTE — Telephone Encounter (Signed)
Copied from [place] [phone]. Topic: General - Other >> Oct 08, 2020  8:23 AM Keene Breath wrote: Reason for CRM: Patient would like a call back to confirm whether the doctor has received an envelope with her paperwork for her FMLA which she said she dropped at the office on 01/14.  Please call patient to discuss at [phone]

## 2020-10-09 ENCOUNTER — Telehealth: Payer: Self-pay

## 2020-10-09 NOTE — Telephone Encounter (Signed)
Received provider statement form from [employer] via fax. Form placed in Jenni's box. Blank copy on file with medical records. Please advise. Thanks TNP

## 2020-10-10 NOTE — Telephone Encounter (Signed)
This was a duplicate. Other letter was faxed yesterday

## 2020-10-17 ENCOUNTER — Other Ambulatory Visit: Payer: Self-pay | Admitting: Physician Assistant

## 2020-10-17 ENCOUNTER — Encounter: Payer: Self-pay | Admitting: Physician Assistant

## 2020-10-17 DIAGNOSIS — I10 Essential (primary) hypertension: Secondary | ICD-10-CM

## 2020-11-07 ENCOUNTER — Encounter: Payer: Self-pay | Admitting: Physician Assistant

## 2020-11-08 ENCOUNTER — Telehealth: Payer: Self-pay

## 2020-11-08 NOTE — Telephone Encounter (Signed)
Received forms for Physical Ability Assessment from [facility]. Forms placed in Jenni's box and blank copy is on file with Medical Records. Thanks TNP

## 2020-11-13 ENCOUNTER — Other Ambulatory Visit: Payer: Self-pay | Admitting: Physician Assistant

## 2020-11-13 DIAGNOSIS — M5416 Radiculopathy, lumbar region: Secondary | ICD-10-CM

## 2020-11-13 DIAGNOSIS — G8929 Other chronic pain: Secondary | ICD-10-CM

## 2020-11-13 DIAGNOSIS — M5442 Lumbago with sciatica, left side: Secondary | ICD-10-CM

## 2020-11-13 NOTE — Telephone Encounter (Signed)
Completed and given to Mickel Baas to fax

## 2020-11-13 NOTE — Telephone Encounter (Signed)
Ashley Mccullough, from [employer], calling and is requesting to have an update on when these will be completed. Please advise.      [phone] secure

## 2020-11-15 NOTE — Telephone Encounter (Signed)
Ashley Mccullough, from [place] , calling stating that they received this form, but that they are still waiting on office notes DOS 05/16/2020. Please advise.      [phone]  Reference [id number]

## 2020-12-12 ENCOUNTER — Ambulatory Visit: Payer: Managed Care, Other (non HMO) | Admitting: Physician Assistant

## 2020-12-17 ENCOUNTER — Ambulatory Visit: Payer: Managed Care, Other (non HMO) | Admitting: Adult Health

## 2020-12-20 ENCOUNTER — Ambulatory Visit: Payer: Managed Care, Other (non HMO) | Admitting: Adult Health

## 2020-12-20 ENCOUNTER — Telehealth: Payer: Self-pay

## 2020-12-20 NOTE — Telephone Encounter (Signed)
I called pt and rescheduled her appt for next Thursday April 14th at 1 pm.

## 2020-12-27 ENCOUNTER — Other Ambulatory Visit: Payer: Self-pay | Admitting: Physician Assistant

## 2020-12-27 ENCOUNTER — Other Ambulatory Visit: Payer: Self-pay

## 2020-12-27 ENCOUNTER — Other Ambulatory Visit: Payer: Self-pay | Admitting: Adult Health

## 2020-12-27 ENCOUNTER — Ambulatory Visit (INDEPENDENT_AMBULATORY_CARE_PROVIDER_SITE_OTHER): Payer: 59 | Admitting: Adult Health

## 2020-12-27 ENCOUNTER — Encounter: Payer: Self-pay | Admitting: Adult Health

## 2020-12-27 VITALS — BP 148/75 | HR 78 | Ht 63.0 in | Wt 337.5 lb

## 2020-12-27 DIAGNOSIS — J0140 Acute pansinusitis, unspecified: Secondary | ICD-10-CM | POA: Diagnosis not present

## 2020-12-27 DIAGNOSIS — M5431 Sciatica, right side: Secondary | ICD-10-CM

## 2020-12-27 DIAGNOSIS — F411 Generalized anxiety disorder: Secondary | ICD-10-CM

## 2020-12-27 DIAGNOSIS — I10 Essential (primary) hypertension: Secondary | ICD-10-CM

## 2020-12-27 DIAGNOSIS — F3341 Major depressive disorder, recurrent, in partial remission: Secondary | ICD-10-CM

## 2020-12-27 MED ORDER — AMOXICILLIN-POT CLAVULANATE 875-125 MG PO TABS: 1.0000 | ORAL_TABLET | Freq: Two times a day (BID) | ORAL | 0 refills | Status: AC

## 2020-12-27 NOTE — Telephone Encounter (Signed)
[facility] faxed refill request for the following medications:  1. buPROPion (WELLBUTRIN XL) 150 MG 24 hr tablet  2. metoprolol succinate (TOPROL-XL) 25 MG 24 hr tablet  Please advise. Thanks TNP

## 2020-12-27 NOTE — Telephone Encounter (Signed)
You just saw patient today do you want her to continue wellbutrin? I see you started her on Prozac today. KW

## 2020-12-27 NOTE — Patient Instructions (Signed)
Amoxicillin; Clavulanic Acid Tablets What is this medicine? AMOXICILLIN; CLAVULANIC ACID (a mox i SIL in; KLAV yoo lan ic AS id) is a penicillin antibiotic. It treats some infections caused by bacteria. It will not work for colds, the flu, or other viruses. This medicine may be used for other purposes; ask your health care provider or pharmacist if you have questions. COMMON BRAND NAME(S): Augmentin What should I tell my health care provider before I take this medicine? They need to know if you have any of these conditions:  kidney disease  liver disease  mononucleosis  stomach or intestine problems such as colitis  an unusual or allergic reaction to amoxicillin, other penicillin or cephalosporin antibiotics, clavulanic acid, other medicines, foods, dyes, or preservatives  pregnant or trying to get pregnant  breast-feeding How should I use this medicine? Take this drug by mouth. Take it as directed on the prescription label at the same time every day. Take it with food at the start of a meal or snack. Take all of this drug unless your health care provider tells you to stop it early. Keep taking it even if you think you are better. Talk to your health care provider about the use of this drug in children. While it may be prescribed for selected conditions, precautions do apply. Overdosage: If you think you have taken too much of this medicine contact a poison control center or emergency room at once. NOTE: This medicine is only for you. Do not share this medicine with others. What if I miss a dose? If you miss a dose, take it as soon as you can. If it is almost time for your next dose, take only that dose. Do not take double or extra doses. What may interact with this medicine?  allopurinol  anticoagulants  birth control pills  methotrexate  probenecid This list may not describe all possible interactions. Give your health care provider a list of all the medicines, herbs,  non-prescription drugs, or dietary supplements you use. Also tell them if you smoke, drink alcohol, or use illegal drugs. Some items may interact with your medicine. What should I watch for while using this medicine? Tell your health care provider if your symptoms do not start to get better or if they get worse. This medicine may cause serious skin reactions. They can happen weeks to months after starting the medicine. Contact your health care provider right away if you notice fevers or flu-like symptoms with a rash. The rash may be red or purple and then turn into blisters or peeling of the skin. Or, you might notice a red rash with swelling of the face, lips or lymph nodes in your neck or under your arms. Do not treat diarrhea with over the counter products. Contact your health care provider if you have diarrhea that lasts more than 2 days or if it is severe and watery. If you have diabetes, you may get a false-positive result for sugar in your urine. Check with your health care provider. Birth control may not work properly while you are taking this medicine. Talk to your health care provider about using an extra method of birth control. What side effects may I notice from receiving this medicine? Side effects that you should report to your doctor or health care provider as soon as possible:  allergic reactions (skin rash, itching or hives; swelling of the face, lips, or tongue)  bloody or watery diarrhea  dark urine  infection (fever, chills, cough, sore   throat, or pain)  kidney injury (trouble passing urine or change in the amount of urine)  redness, blistering, peeling, or loosening of the skin, including inside the mouth  seizures  thrush (white patches in the mouth or mouth sores)  trouble breathing  unusual bruising or bleeding  unusually weak or tired Side effects that usually do not require medical attention (report to your doctor or health care provider if they continue or  are bothersome):  diarrhea  dizziness  headache  nausea, vomiting  unusual vaginal discharge, itching, or odor  upset stomach This list may not describe all possible side effects. Call your doctor for medical advice about side effects. You may report side effects to FDA at 1-800-FDA-1088. Where should I keep my medicine? Keep out of the reach of children and pets. Store at room temperature between 20 and 25 degrees C (68 and 77 degrees F). Throw away any unused drug after the expiration date. NOTE: This sheet is a summary. It may not cover all possible information. If you have questions about this medicine, talk to your doctor, pharmacist, or health care provider.  2021 Elsevier/Gold Standard (2020-07-25 09:45:03) Otitis Media, Adult  Otitis media is a condition in which the middle ear is red and swollen (inflamed) and full of fluid. The middle ear is the part of the ear that contains bones for hearing as well as air that helps send sounds to the brain. The condition usually goes away on its own. What are the causes? This condition is caused by a blockage in the eustachian tube. The eustachian tube connects the middle ear to the back of the nose. It normally allows air into the middle ear. The blockage is caused by fluid or swelling. Problems that can cause blockage include:  A cold or infection that affects the nose, mouth, or throat.  Allergies.  An irritant, such as tobacco smoke.  Adenoids that have become large. The adenoids are soft tissue located in the back of the throat, behind the nose and the roof of the mouth.  Growth or swelling in the upper part of the throat, just behind the nose (nasopharynx).  Damage to the ear caused by change in pressure. This is called barotrauma. What are the signs or symptoms? Symptoms of this condition include:  Ear pain.  Fever.  Problems with hearing.  Being tired.  Fluid leaking from the ear.  Ringing in the ear. How is  this treated? This condition can go away on its own within 3-5 days. But if the condition is caused by bacteria or does not go away on its own, or if it keeps coming back, your doctor may:  Give you antibiotic medicines.  Give you medicines for pain. Follow these instructions at home:  Take over-the-counter and prescription medicines only as told by your doctor.  If you were prescribed an antibiotic medicine, take it as told by your doctor. Do not stop taking the antibiotic even if you start to feel better.  Keep all follow-up visits as told by your doctor. This is important. Contact a doctor if:  You have bleeding from your nose.  There is a lump on your neck.  You are not feeling better in 5 days.  You feel worse instead of better. Get help right away if:  You have pain that is not helped with medicine.  You have swelling, redness, or pain around your ear.  You get a stiff neck.  You cannot move part of your face (  paralysis).  You notice that the bone behind your ear hurts when you touch it.  You get a very bad headache. Summary  Otitis media means that the middle ear is red, swollen, and full of fluid.  This condition usually goes away on its own.  If the problem does not go away, treatment may be needed. You may be given medicines to treat the infection or to treat your pain.  If you were prescribed an antibiotic medicine, take it as told by your doctor. Do not stop taking the antibiotic even if you start to feel better.  Keep all follow-up visits as told by your doctor. This is important. This information is not intended to replace advice given to you by your health care provider. Make sure you discuss any questions you have with your health care provider. Document Revised: 08/04/2019 Document Reviewed: 08/04/2019 Elsevier Patient Education  2021 [employer].

## 2020-12-27 NOTE — Telephone Encounter (Signed)
   Notes to clinic: Patient has appointment today    Requested Prescriptions  Pending Prescriptions Disp Refills   FLUoxetine (PROZAC) 20 MG capsule [Pharmacy Med Name: FLUOXETINE '20MG'$  CAPSULES] 270 capsule 1    Sig: TAKE 3 CAPSULES(60 MG) BY MOUTH DAILY      Psychiatry:  Antidepressants - SSRI Passed - 12/27/2020  7:40 AM      Passed - Completed PHQ-2 or PHQ-9 in the last 360 days      Passed - Valid encounter within last 6 months    Recent Outpatient Visits           3 months ago Primary hypertension   [place], Clearnce Sorrel, PA-C   6 months ago Essential hypertension   [place], Clearnce Sorrel, [place]   7 months ago [facility], Anderson Malta M, [place]   8 months ago Chronic midline low back pain with left-sided sciatica   [facility] Fenton Malling M, PA-C   9 months ago GAD (generalized anxiety disorder)   [facility], Clearnce Sorrel, PA-C       Future Appointments             Today [place], Kelby Aline, [place], [facility]               meloxicam ([place]) 15 MG tablet [Pharmacy Med Name: MELOXICAM '15MG'$  TABLETS] 90 tablet 1    Sig: TAKE 1 TABLET(15 MG) BY MOUTH DAILY      Analgesics:  COX2 Inhibitors Failed - 12/27/2020  7:40 AM      Failed - HGB in normal range and within 360 days    Hemoglobin  Date Value Ref Range Status  11/25/2019 12.1 11.1 - 15.9 g/dL Final          Failed - Cr in normal range and within 360 days    Creatinine, Ser  Date Value Ref Range Status  11/25/2019 1.15 (H) 0.57 - 1.00 mg/dL Final          Passed - Patient is not pregnant      Passed - Valid encounter within last 12 months    Recent Outpatient Visits           3 months ago Primary hypertension   [place], Clearnce Sorrel, PA-C   6 months ago Essential hypertension   [place], Clearnce Sorrel, [place]    7 months ago [facility], [place], [place]   8 months ago Chronic midline low back pain with left-sided sciatica   [place], [place]   9 months ago GAD (generalized anxiety disorder)   [facility], Clearnce Sorrel, PA-C       Future Appointments             Today [facility], Kelby Aline, [place], [place]

## 2020-12-27 NOTE — Progress Notes (Signed)
Established patient visit   Patient: Ashley Mccullough   DOB: 11/29/1960   60 y.o. Female  MRN: [id number] Visit Date: 12/27/2020  Today's healthcare provider: Marcille Buffy, FNP   Chief Complaint  Patient presents with  . Headache  . Ear Fullness   Subjective    Headache  This is a new problem. The current episode started more than 1 month ago. Episode frequency: Only really bothered her a month ago. The problem has been gradually improving. The pain is located in the frontal region. The pain does not radiate. The pain quality is similar to prior headaches. The quality of the pain is described as throbbing. The pain is at a severity of 9/10. The pain is severe. Pertinent negatives include no abdominal pain, blurred vision, dizziness or neck pain. Nothing aggravates the symptoms. She has tried acetaminophen for the symptoms. The treatment provided mild relief. Her past medical history is significant for migraines in the family and obesity.    She reports she had a headache about a month ago she had a frontal headache that lasted intermittent x 1 week and now is resolved.  She denies any fever at that time but did not have fever or signs of illness.   She is taking Singulair and zyrtec.  Patient also c/o of ear fullness in both ears and says this has been going on for about a year now.since last June. She reports trying to ease up with headaches.   Blood pressure is elevated today 145/75, she is not taking readings at home.   Overdue for labs and physical. Patient  denies any fever, body aches,chills, rash, chest pain, shortness of breath, nausea, vomiting, or diarrhea.  Denies dizziness, lightheadedness, pre syncopal or syncopal episodes.    She does not like using nasal sprays.     Medications: Outpatient Medications Prior to Visit  Medication Sig  . acetaminophen (TYLENOL) 650 MG CR tablet Take 650-1,300 mg by mouth at bedtime. As needed for arthritis.  .  Cetirizine HCl 10 MG CAPS Take by mouth.  . COLLAGEN PO Take by mouth.  . Cyanocobalamin (VITAMIN B-12) 5000 MCG SUBL Place 5,000 mcg under the tongue daily.  Marland Kitchen gabapentin (NEURONTIN) 100 MG capsule TAKE 1 CAPSULE(100 MG) BY MOUTH THREE TIMES DAILY  . hydrOXYzine (ATARAX/VISTARIL) 25 MG tablet Take 1 tablet (25 mg total) by mouth every 4 (four) hours as needed for anxiety.  Marland Kitchen losartan-hydrochlorothiazide (HYZAAR) 100-25 MG tablet TAKE 1 TABLET BY MOUTH DAILY  . metaxalone (SKELAXIN) 800 MG tablet Take 0.5-1 tablets (400-800 mg total) by mouth 3 (three) times daily as needed for muscle spasms.  . montelukast (SINGULAIR) 10 MG tablet TAKE 1 TABLET BY MOUTH DAILY  . Omega-3 Fatty Acids (FISH OIL PO) Take by mouth.  . traMADol (ULTRAM) 50 MG tablet 1/2-1 po bid prn  . [DISCONTINUED] buPROPion (WELLBUTRIN XL) 150 MG 24 hr tablet Take 1 tablet (150 mg total) by mouth daily.  . [DISCONTINUED] FLUoxetine (PROZAC) 20 MG capsule Take 3 capsules (60 mg total) by mouth daily.  . [DISCONTINUED] meloxicam (MOBIC) 15 MG tablet TAKE 1 TABLET(15 MG) BY MOUTH DAILY  . [DISCONTINUED] metoprolol succinate (TOPROL-XL) 25 MG 24 hr tablet TAKE 1 TABLET(25 MG) BY MOUTH DAILY  . Cholecalciferol (VITAMIN D3) 5000 units TABS Take 5,000 tablets by mouth daily. (Patient not taking: Reported on 12/27/2020)   No facility-administered medications prior to visit.    Review of Systems  Eyes: Negative for blurred vision.  Gastrointestinal: Negative for abdominal pain.  Musculoskeletal: Negative for neck pain.  Neurological: Positive for headaches. Negative for dizziness.       Objective    BP (!) 148/75 (BP Location: Right Arm, Patient Position: Sitting, Cuff Size: Small)   Pulse 78   Ht '5\' 3"'$  (1.6 m)   Wt (!) 337 lb 8 oz (153.1 kg)   SpO2 98%   BMI 59.79 kg/m     Physical Exam Vitals reviewed.  Constitutional:      General: She is not in acute distress.    Appearance: She is well-developed. She is obese. She  is not diaphoretic.     Interventions: She is not intubated. HENT:     Head: Normocephalic and atraumatic.     Right Ear: External ear normal.     Left Ear: External ear normal.     Nose: Nose normal.     Mouth/Throat:     Pharynx: No oropharyngeal exudate.  Eyes:     General: Lids are normal. No scleral icterus.       Right eye: No discharge.        Left eye: No discharge.     Conjunctiva/sclera: Conjunctivae normal.     Right eye: Right conjunctiva is not injected. No exudate or hemorrhage.    Left eye: Left conjunctiva is not injected. No exudate or hemorrhage.    Pupils: Pupils are equal, round, and reactive to light.  Neck:     Thyroid: No thyroid mass or thyromegaly.     Vascular: Normal carotid pulses. No carotid bruit, hepatojugular reflux or JVD.     Trachea: Trachea and phonation normal. No tracheal tenderness or tracheal deviation.     Meningeal: Brudzinski's sign and Kernig's sign absent.  Cardiovascular:     Rate and Rhythm: Normal rate and regular rhythm.     Pulses: Normal pulses.          Radial pulses are 2+ on the right side and 2+ on the left side.       Dorsalis pedis pulses are 2+ on the right side and 2+ on the left side.       Posterior tibial pulses are 2+ on the right side and 2+ on the left side.     Heart sounds: Normal heart sounds, S1 normal and S2 normal. Heart sounds not distant. No murmur heard. No friction rub. No gallop.   Pulmonary:     Effort: Pulmonary effort is normal. No tachypnea, bradypnea, accessory muscle usage or respiratory distress. She is not intubated.     Breath sounds: Normal breath sounds. No stridor. No wheezing or rales.  Chest:     Chest wall: No tenderness.  Breasts:     Right: No supraclavicular adenopathy.     Left: No supraclavicular adenopathy.    Abdominal:     General: Bowel sounds are normal. There is no distension or abdominal bruit.     Palpations: Abdomen is soft. There is no shifting dullness, fluid wave,  hepatomegaly, splenomegaly, mass or pulsatile mass.     Tenderness: There is no abdominal tenderness. There is no guarding or rebound.     Hernia: No hernia is present.  Musculoskeletal:        General: No tenderness or deformity. Normal range of motion.     Cervical back: Full passive range of motion without pain, normal range of motion and neck supple. No edema, erythema or rigidity. No spinous process tenderness or muscular tenderness. Normal range of  motion.  Lymphadenopathy:     Head:     Right side of head: No submental, submandibular, tonsillar, preauricular, posterior auricular or occipital adenopathy.     Left side of head: No submental, submandibular, tonsillar, preauricular, posterior auricular or occipital adenopathy.     Cervical: No cervical adenopathy.     Right cervical: No superficial, deep or posterior cervical adenopathy.    Left cervical: No superficial, deep or posterior cervical adenopathy.     Upper Body:     Right upper body: No supraclavicular or pectoral adenopathy.     Left upper body: No supraclavicular or pectoral adenopathy.  Skin:    General: Skin is warm and dry.     Coloration: Skin is not pale.     Findings: No abrasion, bruising, burn, ecchymosis, erythema, lesion, petechiae or rash.     Nails: There is no clubbing.  Neurological:     Mental Status: She is alert and oriented to person, place, and time.     GCS: GCS eye subscore is 4. GCS verbal subscore is 5. GCS motor subscore is 6.     Cranial Nerves: No cranial nerve deficit.     Sensory: No sensory deficit.     Motor: No tremor, atrophy, abnormal muscle tone or seizure activity.     Coordination: Coordination normal.     Gait: Gait normal.     Deep Tendon Reflexes: Reflexes are normal and symmetric. Reflexes normal. Babinski sign absent on the right side. Babinski sign absent on the left side.     Reflex Scores:      Tricep reflexes are 2+ on the right side and 2+ on the left side.      Bicep  reflexes are 2+ on the right side and 2+ on the left side.      Brachioradialis reflexes are 2+ on the right side and 2+ on the left side.      Patellar reflexes are 2+ on the right side and 2+ on the left side.      Achilles reflexes are 2+ on the right side and 2+ on the left side. Psychiatric:        Speech: Speech normal.        Behavior: Behavior normal.        Thought Content: Thought content normal.        Judgment: Judgment normal.      No results found for any visits on 12/27/20.  Assessment & Plan     Primary hypertension  Acute non-recurrent pansinusitis  Meds ordered this encounter  Medications  . amoxicillin-clavulanate (AUGMENTIN) 875-125 MG tablet    Sig: Take 1 tablet by mouth 2 (two) times daily for 10 days.    Dispense:  20 tablet    Refill:  0     Red Flags discussed. The patient was given clear instructions to go to ER or return to medical center if any red flags develop, symptoms do not improve, worsen or new problems develop. They verbalized understanding.  Return in about 1 month (around 01/26/2021), or if symptoms worsen or fail to improve, for at any time for any worsening symptoms, Go to Emergency room/ urgent care if worse.      The entirety of the information documented in the History of Present Illness, Review of Systems and Physical Exam were personally obtained by me. Portions of this information were initially documented by the CMA and reviewed by me for thoroughness and accuracy.   [place] Radin Raptis,  [place] [phone] (phone) [phone] (fax)  [place]

## 2020-12-28 MED ORDER — BUPROPION HCL ER (XL) 150 MG PO TB24: 150.0000 mg | ORAL_TABLET | Freq: Every day | ORAL | 0 refills | Status: DC

## 2020-12-28 MED ORDER — METOPROLOL SUCCINATE ER 25 MG PO TB24
ORAL_TABLET | ORAL | 0 refills | Status: DC
Start: 2020-12-28 — End: 2021-03-28

## 2020-12-28 NOTE — Telephone Encounter (Signed)
Ok to fill enough until she has appointment with Dr. Jacinto Reap end of may. Needs labs then.

## 2020-12-28 NOTE — Telephone Encounter (Signed)
Patient reports that she is on both medications, please review request below. KW

## 2020-12-28 NOTE — Telephone Encounter (Signed)
Before I send refill in can you take a look at medications its flagging that it is high drug risk being on these two drugs. KW#

## 2020-12-28 NOTE — Telephone Encounter (Signed)
Clarification, I did not start Prozac today, it was a chronic medication refill. Appears patient is taking both from review, will you call her to clarify with her as this was the first time I seen her and both are listed and have been filled by [facility] in past.

## 2020-12-31 DIAGNOSIS — J0140 Acute pansinusitis, unspecified: Secondary | ICD-10-CM | POA: Insufficient documentation

## 2021-01-13 ENCOUNTER — Other Ambulatory Visit: Payer: Self-pay | Admitting: Adult Health

## 2021-01-13 DIAGNOSIS — F3341 Major depressive disorder, recurrent, in partial remission: Secondary | ICD-10-CM

## 2021-01-18 ENCOUNTER — Other Ambulatory Visit: Payer: Self-pay | Admitting: Physician Assistant

## 2021-01-18 DIAGNOSIS — I10 Essential (primary) hypertension: Secondary | ICD-10-CM

## 2021-01-22 ENCOUNTER — Telehealth: Payer: Self-pay | Admitting: Adult Health

## 2021-01-22 DIAGNOSIS — F3341 Major depressive disorder, recurrent, in partial remission: Secondary | ICD-10-CM

## 2021-01-22 NOTE — Telephone Encounter (Signed)
Pt called saying she only got 30 pills of the 20 mg Prozac the last time it was refilled.  She is almost out and needs a new refill but it needs to be 20 mg 3 times a day.   She had been getting 3 months prescription at a time.  He would like the most she can get with her insurance.  [facility]  CB#  [phone]

## 2021-01-23 MED ORDER — FLUOXETINE HCL 20 MG PO CAPS: ORAL_CAPSULE | ORAL | 0 refills | Status: DC

## 2021-01-23 NOTE — Telephone Encounter (Signed)
Prescription has been sent. KW

## 2021-01-23 NOTE — Telephone Encounter (Signed)
270 tablets will be 3 months worth.  No refills she will need to be seen by someone

## 2021-02-12 ENCOUNTER — Encounter: Payer: Self-pay | Admitting: Family Medicine

## 2021-02-12 ENCOUNTER — Ambulatory Visit (INDEPENDENT_AMBULATORY_CARE_PROVIDER_SITE_OTHER): Payer: 59 | Admitting: Family Medicine

## 2021-02-12 ENCOUNTER — Other Ambulatory Visit: Payer: Self-pay

## 2021-02-12 VITALS — BP 152/82 | HR 76 | Temp 98.9°F | Resp 16 | Ht 61.0 in | Wt 331.9 lb

## 2021-02-12 DIAGNOSIS — R7303 Prediabetes: Secondary | ICD-10-CM

## 2021-02-12 DIAGNOSIS — I10 Essential (primary) hypertension: Secondary | ICD-10-CM | POA: Diagnosis not present

## 2021-02-12 DIAGNOSIS — G8929 Other chronic pain: Secondary | ICD-10-CM

## 2021-02-12 DIAGNOSIS — E7800 Pure hypercholesterolemia, unspecified: Secondary | ICD-10-CM | POA: Diagnosis not present

## 2021-02-12 DIAGNOSIS — Z6841 Body Mass Index (BMI) 40.0 and over, adult: Secondary | ICD-10-CM

## 2021-02-12 DIAGNOSIS — F330 Major depressive disorder, recurrent, mild: Secondary | ICD-10-CM

## 2021-02-12 DIAGNOSIS — M5416 Radiculopathy, lumbar region: Secondary | ICD-10-CM

## 2021-02-12 DIAGNOSIS — M48062 Spinal stenosis, lumbar region with neurogenic claudication: Secondary | ICD-10-CM

## 2021-02-12 DIAGNOSIS — M5442 Lumbago with sciatica, left side: Secondary | ICD-10-CM

## 2021-02-12 MED ORDER — LOSARTAN POTASSIUM-HCTZ 100-25 MG PO TABS: 1.0000 | ORAL_TABLET | Freq: Every day | ORAL | 3 refills | Status: DC

## 2021-02-12 MED ORDER — GABAPENTIN 300 MG PO CAPS: 300.0000 mg | ORAL_CAPSULE | Freq: Three times a day (TID) | ORAL | 3 refills | Status: DC

## 2021-02-12 NOTE — Assessment & Plan Note (Signed)
Recommend low carb diet °Recheck A1c  °

## 2021-02-12 NOTE — Assessment & Plan Note (Signed)
Chronic and uncontrolled recently Continue therapy Continue prozac and wellbutrin

## 2021-02-12 NOTE — Assessment & Plan Note (Signed)
Reviewed last lipid panel Not currently on a statin Recheck FLP and CMP Discussed diet and exercise  

## 2021-02-12 NOTE — Assessment & Plan Note (Signed)
Uncontrolled Increase gabapentin to 300 mg 3 times daily Referral to pain management for interventional options Referral to physical therapy

## 2021-02-12 NOTE — Assessment & Plan Note (Addendum)
Chronic and uncontrolled Continue Losartan-HCTZ and metoprolol at current dose Declines adding amlodipine 5 mg daily Will try 3 months of lifestyle changes and recheck Recheck metabolic panel

## 2021-02-12 NOTE — Patient Instructions (Signed)

## 2021-02-12 NOTE — Progress Notes (Signed)
Established patient visit   Patient: Ashley Mccullough   DOB: [birth year]   60 y.o. Female  MRN: [id number] Visit Date: 02/12/2021  Today's healthcare provider: Lavon Paganini, MD   Chief Complaint  Patient presents with  . Hypertension   Subjective    Hypertension Pertinent negatives include no chest pain, headaches, neck pain, palpitations or shortness of breath.    Mental Health She is currently seeing a [facility] [employer] [employer] [facility]. She reports she wanted to see someone because she has been struggling with her depression for an extended timeframe. She has attended 3 sessions but she has seen therapist intermittently since she was 60yr old. She feels confident with her current therapist. She is taking wellbutrin and prozac and she believes it is helpful.   Spinal Stenosis She is requesting for a prescription for a shower bench for her spinal stenosis. She had one in the past but she misplaced it. She reports taking 300 mg of gabapentin for back pain. Her pain is in her back and radiates down to her legs. She was receiving spinal injections on both sides but due to her weight she is unable to receive the injections because of the weight capacity of the table. She has tried maloxicam and muscle relaxer but it has caused her stomach pain and fever.   Lab work She is requesting to receive her lab work. She says her A1C has not been checked since her last visit.    Hypertension, follow-up  BP Readings from Last 3 Encounters:  02/12/21 (!) 152/82  12/27/20 (!) 148/75  06/15/20 118/90   Wt Readings from Last 3 Encounters:  02/12/21 (!) 331 lb 14.4 oz (150.5 kg)  12/27/20 (!) 337 lb 8 oz (153.1 kg)  06/15/20 (!) 316 lb (143.3 kg)     She was last seen for hypertension 1 months ago.  BP at that visit was 148/75. Management since that visit includes no changes.  She reports excellent compliance with treatment. She is not  having side effects.  She is following a Regular diet. She is not exercising. She does not smoke. She doesn't want to change add another medication, she prefers to work on her diet and follow-up.  Losartan is only being refilled every 30 days but she is requesting for a 90 day supply.  Use of agents associated with hypertension: none.   Outside blood pressures are not being checked. Symptoms: No chest pain No chest pressure  No palpitations No syncope  No dyspnea No orthopnea  No paroxysmal nocturnal dyspnea Yes lower extremity edema   Pertinent labs: Lab Results  Component Value Date   CHOL 245 (H) 11/25/2019   HDL 89 11/25/2019   LDLCALC 147 (H) 11/25/2019   TRIG 53 11/25/2019   CHOLHDL 2.8 07/02/2018   Lab Results  Component Value Date   NA 138 11/25/2019   K 4.7 11/25/2019   CREATININE 1.15 (H) 11/25/2019   GFRNONAA 53 (L) 11/25/2019   GFRAA 61 11/25/2019   GLUCOSE 92 11/25/2019     The 10-year ASCVD risk score (Mikey BussingDC Jr., et al., 2013) is: 9.3%   ---------------------------------------------------------------------------------------------------     Patient Active Problem List   Diagnosis Date Noted  . Chronic pain of both knees 06/21/2020  . Spinal stenosis of lumbar region with neurogenic claudication 11/18/2019  . DDD (degenerative disc disease), lumbosacral 11/18/2019  . [place] episode of recurrent major depressive disorder [place] 10/26/2018  . BMI  60.0-69.9, adult ([place]) 07/01/2017  . Morbid obesity due to excess calories ([place]) 12/24/2015  . Allergic rhinitis 05/29/2015  . Anxiety, generalized 05/29/2015  . D (diarrhea) 05/29/2015  . Avitaminosis D 05/29/2015  . Prediabetes 05/29/2015  . Fatigue 05/29/2015  . Hypertension 03/15/2015  . Cannot sleep 11/13/2009  . Congenital pes planus 03/13/2008  . Hypercholesterolemia without hypertriglyceridemia 09/16/2006   Social History   Tobacco Use  . Smoking status: Never Smoker  . Smokeless tobacco:  Never Used  Substance Use Topics  . Alcohol use: Not Currently  . Drug use: No   Allergies  Allergen Reactions  . Cashew Nut Oil Hives, Itching and Swelling  . Latex Itching  . Simvastatin     Other reaction(s): Unknown Memory changes Memory changes  . Sulfa Antibiotics     Other reaction(s): Unknown  . Levofloxacin Rash  . Prednisone Rash       Medications: Outpatient Medications Prior to Visit  Medication Sig  . acetaminophen (TYLENOL) 650 MG CR tablet Take 650-1,300 mg by mouth at bedtime. As needed for arthritis.  Marland Kitchen buPROPion (WELLBUTRIN XL) 150 MG 24 hr tablet Take 1 tablet (150 mg total) by mouth daily.  . Cetirizine HCl 10 MG CAPS Take by mouth.  . Cholecalciferol (VITAMIN D3) 5000 units TABS Take 5,000 tablets by mouth daily.  . COLLAGEN PO Take by mouth.  Marland Kitchen FLUoxetine (PROZAC) 20 MG capsule TAKE 3 CAPSULES(60 MG) BY MOUTH DAILY  . hydrOXYzine (ATARAX/VISTARIL) 25 MG tablet Take 1 tablet (25 mg total) by mouth every 4 (four) hours as needed for anxiety.  . meloxicam (MOBIC) 15 MG tablet TAKE 1 TABLET(15 MG) BY MOUTH DAILY  . metaxalone (SKELAXIN) 800 MG tablet Take 0.5-1 tablets (400-800 mg total) by mouth 3 (three) times daily as needed for muscle spasms.  . metoprolol succinate (TOPROL-XL) 25 MG 24 hr tablet TAKE 1 TABLET(25 MG) BY MOUTH DAILY  . montelukast (SINGULAIR) 10 MG tablet TAKE 1 TABLET BY MOUTH DAILY  . traMADol (ULTRAM) 50 MG tablet 1/2-1 po bid prn  . [DISCONTINUED] gabapentin (NEURONTIN) 100 MG capsule TAKE 1 CAPSULE(100 MG) BY MOUTH THREE TIMES DAILY  . [DISCONTINUED] losartan-hydrochlorothiazide (HYZAAR) 100-25 MG tablet TAKE 1 TABLET BY MOUTH DAILY  . [DISCONTINUED] Cyanocobalamin (VITAMIN B-12) 5000 MCG SUBL Place 5,000 mcg under the tongue daily. (Patient not taking: Reported on 02/12/2021)  . [DISCONTINUED] Omega-3 Fatty Acids (FISH OIL PO) Take by mouth. (Patient not taking: Reported on 02/12/2021)   No facility-administered medications prior to  visit.    Review of Systems  Constitutional: Negative for activity change, appetite change, chills, fatigue and fever.  HENT: Negative for ear pain, nosebleeds, rhinorrhea, sinus pressure, sinus pain and sore throat.   Eyes: Negative for pain.  Respiratory: Negative for cough, chest tightness, shortness of breath and wheezing.   Cardiovascular: Positive for leg swelling. Negative for chest pain and palpitations.  Gastrointestinal: Negative for abdominal pain, blood in stool, constipation, diarrhea, nausea and vomiting.  Genitourinary: Negative for flank pain, frequency, pelvic pain and urgency.  Musculoskeletal: Positive for back pain. Negative for neck pain and neck stiffness.  Neurological: Negative for dizziness, syncope, weakness, light-headedness, numbness and headaches.       Objective    BP (!) 152/82 (BP Location: Right Arm, Patient Position: Sitting, Cuff Size: Large)   Pulse 76   Temp 98.9 F (37.2 C) (Oral)   Resp 16   Ht '5\' 1"'$  (1.549 m)   Wt (!) 331 lb 14.4 oz (150.5 kg)  SpO2 97%   BMI 62.71 kg/m  BP Readings from Last 3 Encounters:  02/12/21 (!) 152/82  12/27/20 (!) 148/75  06/15/20 118/90   Wt Readings from Last 3 Encounters:  02/12/21 (!) 331 lb 14.4 oz (150.5 kg)  12/27/20 (!) 337 lb 8 oz (153.1 kg)  06/15/20 (!) 316 lb (143.3 kg)       Physical Exam Vitals reviewed.  Constitutional:      General: She is not in acute distress.    Appearance: Normal appearance. She is well-developed. She is not diaphoretic.  HENT:     Head: Normocephalic and atraumatic.  Eyes:     General: No scleral icterus.    Conjunctiva/sclera: Conjunctivae normal.  Neck:     Thyroid: No thyromegaly.  Cardiovascular:     Rate and Rhythm: Normal rate and regular rhythm.     Pulses: Normal pulses.     Heart sounds: Normal heart sounds. No murmur heard.   Pulmonary:     Effort: Pulmonary effort is normal. No respiratory distress.     Breath sounds: Normal breath sounds. No  wheezing, rhonchi or rales.  Musculoskeletal:     Cervical back: Neck supple.     Right lower leg: Edema present.     Left lower leg: Edema present.  Lymphadenopathy:     Cervical: No cervical adenopathy.  Skin:    General: Skin is warm and dry.     Findings: No rash.  Neurological:     Mental Status: She is alert and oriented to person, place, and time. Mental status is at baseline.  Psychiatric:        Mood and Affect: Mood normal.        Behavior: Behavior normal.       No results found for any visits on 02/12/21.  Assessment & Plan     Problem List Items Addressed This Visit      Cardiovascular and Mediastinum   Hypertension - Primary    Chronic and uncontrolled Continue Losartan-HCTZ and metoprolol at current dose Declines adding amlodipine 5 mg daily Will try 3 months of lifestyle changes and recheck Recheck metabolic panel      Relevant Medications   losartan-hydrochlorothiazide (HYZAAR) 100-25 MG tablet   Other Relevant Orders   Comprehensive metabolic panel     Other   Hypercholesterolemia without hypertriglyceridemia    Reviewed last lipid panel Not currently on a statin Recheck FLP and CMP Discussed diet and exercise       Relevant Medications   losartan-hydrochlorothiazide (HYZAAR) 100-25 MG tablet   Other Relevant Orders   Lipid panel   Comprehensive metabolic panel   Prediabetes    Recommend low carb diet Recheck A1c       Relevant Orders   Hemoglobin A1c   Morbid obesity due to excess calories ([facility])    Discussed importance of healthy weight management Discussed diet and exercise       BMI 60.0-69.9, adult ([facility])   Mild episode of recurrent major depressive disorder ([facility])    Chronic and uncontrolled recently Continue therapy Continue prozac and wellbutrin      Spinal stenosis of lumbar region with neurogenic claudication    Uncontrolled Increase gabapentin to 300 mg 3 times daily Referral to pain management for interventional  options Referral to physical therapy      Relevant Medications   gabapentin (NEURONTIN) 300 MG capsule   Other Relevant Orders   Ambulatory referral to Pain Clinic   Ambulatory referral to Physical Therapy  Other Visit Diagnoses    Chronic midline low back pain with left-sided sciatica       Relevant Medications   gabapentin (NEURONTIN) 300 MG capsule   Other Relevant Orders   Ambulatory referral to Pain Clinic   Ambulatory referral to Physical Therapy   Lumbar radiculitis       Relevant Medications   gabapentin (NEURONTIN) 300 MG capsule   Other Relevant Orders   Ambulatory referral to Pain Clinic   Ambulatory referral to Physical Therapy       Return in about 3 months (around 05/15/2021) for chronic disease f/u, call back in 1 month to schedule with new PCP.       I,Essence Turner,acting as a Education administrator for Lavon Paganini, MD.,have documented all relevant documentation on the behalf of Lavon Paganini, MD,as directed by  Lavon Paganini, MD while in the presence of Lavon Paganini, MD.  I, Lavon Paganini, MD, have reviewed all documentation for this visit. The documentation on 02/12/21 for the exam, diagnosis, procedures, and orders are all accurate and complete.   Ndea Kilroy, Dionne Bucy, MD, MPH [facility]

## 2021-02-12 NOTE — Assessment & Plan Note (Signed)
Discussed importance of healthy weight management Discussed diet and exercise  

## 2021-02-15 ENCOUNTER — Other Ambulatory Visit: Payer: Self-pay

## 2021-02-15 DIAGNOSIS — M5431 Sciatica, right side: Secondary | ICD-10-CM

## 2021-02-15 DIAGNOSIS — M5432 Sciatica, left side: Secondary | ICD-10-CM

## 2021-02-15 MED ORDER — MELOXICAM 15 MG PO TABS: ORAL_TABLET | ORAL | 0 refills | Status: DC

## 2021-02-15 NOTE — Telephone Encounter (Signed)
[facility] faxed refill request for the following medications:  meloxicam (MOBIC) 15 MG tablet   Please advise.  

## 2021-03-27 ENCOUNTER — Other Ambulatory Visit: Payer: Self-pay | Admitting: Physician Assistant

## 2021-03-27 ENCOUNTER — Telehealth: Payer: Self-pay

## 2021-03-27 DIAGNOSIS — I10 Essential (primary) hypertension: Secondary | ICD-10-CM

## 2021-03-27 DIAGNOSIS — F411 Generalized anxiety disorder: Secondary | ICD-10-CM

## 2021-03-27 DIAGNOSIS — F3341 Major depressive disorder, recurrent, in partial remission: Secondary | ICD-10-CM

## 2021-03-27 NOTE — Telephone Encounter (Signed)
[facility] faxed refill request for the following medications:  buPROPion (WELLBUTRIN XL) 150 MG 24 hr tablet  metoprolol succinate (TOPROL-XL) 25 MG 24 hr tablet  Please advise.

## 2021-03-28 MED ORDER — METOPROLOL SUCCINATE ER 25 MG PO TB24: ORAL_TABLET | ORAL | 1 refills | Status: DC

## 2021-03-28 MED ORDER — BUPROPION HCL ER (XL) 150 MG PO TB24: 150.0000 mg | ORAL_TABLET | Freq: Every day | ORAL | 1 refills | Status: DC

## 2021-03-28 NOTE — Telephone Encounter (Signed)
Patients last office visit was 02/12/21 to address chronic issues and she has a scheduled follow up appointment for 08/20/21. KW

## 2021-04-11 ENCOUNTER — Other Ambulatory Visit: Payer: Self-pay

## 2021-04-11 ENCOUNTER — Ambulatory Visit
Payer: 59 | Attending: Student in an Organized Health Care Education/Training Program | Admitting: Student in an Organized Health Care Education/Training Program

## 2021-04-11 ENCOUNTER — Encounter: Payer: Self-pay | Admitting: Student in an Organized Health Care Education/Training Program

## 2021-04-11 ENCOUNTER — Telehealth: Payer: Self-pay

## 2021-04-11 VITALS — BP 178/67 | HR 71 | Temp 96.8°F | Resp 16 | Ht 61.0 in | Wt 337.1 lb

## 2021-04-11 DIAGNOSIS — M170 Bilateral primary osteoarthritis of knee: Secondary | ICD-10-CM | POA: Insufficient documentation

## 2021-04-11 DIAGNOSIS — M5416 Radiculopathy, lumbar region: Secondary | ICD-10-CM | POA: Insufficient documentation

## 2021-04-11 DIAGNOSIS — G894 Chronic pain syndrome: Secondary | ICD-10-CM | POA: Insufficient documentation

## 2021-04-11 DIAGNOSIS — M533 Sacrococcygeal disorders, not elsewhere classified: Secondary | ICD-10-CM | POA: Diagnosis present

## 2021-04-11 DIAGNOSIS — M47816 Spondylosis without myelopathy or radiculopathy, lumbar region: Secondary | ICD-10-CM | POA: Insufficient documentation

## 2021-04-11 DIAGNOSIS — G8929 Other chronic pain: Secondary | ICD-10-CM | POA: Diagnosis present

## 2021-04-11 NOTE — Telephone Encounter (Signed)
I thought this sounded familiar. Yes I will resend the forms that were faxed on 04/03/21. Sorry.

## 2021-04-11 NOTE — Progress Notes (Signed)
Safety precautions to be maintained throughout the outpatient stay will include: orient to surroundings, keep bed in low position, maintain call bell within reach at all times, provide assistance with transfer out of bed and ambulation.  

## 2021-04-11 NOTE — Telephone Encounter (Signed)
I just filled these out last week. Can we just resend?

## 2021-04-11 NOTE — Telephone Encounter (Signed)
Received forms from [place]. Forms placed in Dr. Sharmaine Base box. Pt's LOV was with Dr. Jacinto Reap on 02/12/21. Blank copy is already on file in pt's chart. Please advise.

## 2021-04-11 NOTE — Telephone Encounter (Signed)
Thanks
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
## 2021-04-11 NOTE — Telephone Encounter (Signed)
FYI. KW 

## 2021-04-12 NOTE — Telephone Encounter (Signed)
Forms were resent via fax 04/11/21.

## 2021-04-26 ENCOUNTER — Other Ambulatory Visit: Payer: Self-pay | Admitting: Family Medicine

## 2021-04-26 DIAGNOSIS — F3341 Major depressive disorder, recurrent, in partial remission: Secondary | ICD-10-CM

## 2021-04-28 ENCOUNTER — Other Ambulatory Visit: Payer: Self-pay | Admitting: Physician Assistant

## 2021-04-28 DIAGNOSIS — G8929 Other chronic pain: Secondary | ICD-10-CM

## 2021-04-28 DIAGNOSIS — M5442 Lumbago with sciatica, left side: Secondary | ICD-10-CM

## 2021-04-28 DIAGNOSIS — M5416 Radiculopathy, lumbar region: Secondary | ICD-10-CM

## 2021-04-29 ENCOUNTER — Other Ambulatory Visit: Payer: Self-pay

## 2021-04-29 ENCOUNTER — Ambulatory Visit
Admission: RE | Admit: 2021-04-29 | Discharge: 2021-04-29 | Disposition: A | Discharge status: Home / Self Care | Payer: 59 | Source: Ambulatory Visit | Attending: Emergency Medicine | Admitting: Emergency Medicine

## 2021-04-29 VITALS — BP 175/88 | HR 84 | Temp 98.3°F | Resp 18

## 2021-04-29 DIAGNOSIS — L02211 Cutaneous abscess of abdominal wall: Secondary | ICD-10-CM | POA: Diagnosis not present

## 2021-04-29 MED ORDER — DOXYCYCLINE HYCLATE 100 MG PO CAPS: 100.0000 mg | ORAL_CAPSULE | Freq: Two times a day (BID) | ORAL | 0 refills | Status: AC

## 2021-04-29 NOTE — Discharge Instructions (Addendum)
Take antibiotic (Doxycycline) with food as prescribed. Apply warm compresses to promote drainage. Keep covered until wound closes. Expect it to gradually improve over 7 - 10 days.  Watch for signs of worsening infection: increased redness, swelling, red streaking, fever, or worsening pain. If these symptoms are present, or if you have new or concerning symptoms, return to clinic immediately for a recheck.

## 2021-04-29 NOTE — ED Triage Notes (Signed)
Pt here with abscess under stomach fold in left groin area that is open and draining.

## 2021-04-29 NOTE — ED Provider Notes (Signed)
Chief Complaint  Patient presents with   Abscess     Subjective/HPI:  Ashley Mccullough is a very pleasant 60 y.o. female presents with abscess under left stomach fold in the groin region for which she noticed a couple of days ago.  Patient reports that the area is open and draining.  She reports that she tends to get these at times and it does take some time for them to close.  She does not report any fever, chills, vomiting.  She does report that the area has been quite tender.  ROS: General/Constitutional: No fevers, chills, or night sweats Skin: See HPI Musculoskeletal: No weakness, myalgias, arthralgias, joint swelling, locking popping or limitations in joint range of motion Peripheral Vascular: No edema, numbness, discoloration, pain, or paresthesias Lymph: No swelling, red streaks or swollen lymph nodes  OBJECTIVE:  Vitals:   04/29/21 1408 04/29/21 1431  BP: (!) 201/133 (!) 175/88  Pulse: 84   Resp: 18   Temp: 98.3 F (36.8 C)   SpO2: 97%     General:   Alert and oriented, in no acute distress  Lesion Description: Open draining abscess noted beneath left lower abdominal skin fold measuring approximately 2 cm x 2 cm with central area of opening with purulent drainage.  Mild TTP noted about entire area. Lymph:   No lymphadenopathy, red streaking, redness, pain or swelling noted Musculoskeletal:   No involvement of deep tissue, tendon or bony deformity, full range of motion, strength normal Extremities:   No edema, cyanosis Vascular: Normal perfusion, no arterial or venous injury Neurological: Full range of motion, sensation intact, reflexes normal, strength normal  PROCEDURE NOTE:  No procedure completed in clinic today as the abscess is currently open and draining.   Tetanus/Tdap.  Not indicated   Assessment:   1. Abscess of skin of abdomen - doxycycline (VIBRAMYCIN) 100 MG capsule; Take 1 capsule (100 mg total) by mouth 2 (two) times daily for 10 days.  Dispense:  20 capsule; Refill: 0   Plan:      MDM: Patient presents with abscess under left stomach fold in the groin region for which she noticed a couple of days ago.  Patient reports that the area is open and draining.  She reports that she tends to get these at times and it does take some time for them to close.  She does not report any fever, chills, vomiting.  She does report that the area has been quite tender.  Chart review completed.  Blood pressure was elevated in clinic, but on manual recheck 175/88.  She has not taken her blood pressure medications today.  Given that the area is already open and draining, will cover for bacterial infection with doxycycline twice daily for the next 10 days.  There is no central area of fluctuance for which we would need additional I&D today.  Advised to return with any worsening of symptoms to include increased pain, redness, nausea, fevers.  Patient verbalized understanding and agreed with plan.  Patient stable upon discharge.  Return as needed.    Discharge Instructions      Take antibiotic (Doxycycline) with food as prescribed. Apply warm compresses to promote drainage. Keep covered until wound closes. Expect it to gradually improve over 7 - 10 days.  Watch for signs of worsening infection: increased redness, swelling, red streaking, fever, or worsening pain. If these symptoms are present, or if you have new or concerning symptoms, return to clinic immediately for a recheck.  Serafina Royals, [place] 04/29/21 1433

## 2021-06-05 ENCOUNTER — Other Ambulatory Visit: Payer: Self-pay

## 2021-06-05 ENCOUNTER — Ambulatory Visit (INDEPENDENT_AMBULATORY_CARE_PROVIDER_SITE_OTHER): Payer: 59 | Admitting: Family Medicine

## 2021-06-05 ENCOUNTER — Encounter: Payer: Self-pay | Admitting: Family Medicine

## 2021-06-05 VITALS — BP 158/93 | HR 70 | Temp 98.4°F | Wt 346.8 lb

## 2021-06-05 DIAGNOSIS — M5416 Radiculopathy, lumbar region: Secondary | ICD-10-CM | POA: Insufficient documentation

## 2021-06-05 DIAGNOSIS — G4710 Hypersomnia, unspecified: Secondary | ICD-10-CM | POA: Insufficient documentation

## 2021-06-05 DIAGNOSIS — Z23 Encounter for immunization: Secondary | ICD-10-CM

## 2021-06-05 DIAGNOSIS — R5382 Chronic fatigue, unspecified: Secondary | ICD-10-CM | POA: Diagnosis not present

## 2021-06-05 DIAGNOSIS — F5101 Primary insomnia: Secondary | ICD-10-CM

## 2021-06-05 DIAGNOSIS — Z6841 Body Mass Index (BMI) 40.0 and over, adult: Secondary | ICD-10-CM

## 2021-06-05 DIAGNOSIS — I10 Essential (primary) hypertension: Secondary | ICD-10-CM | POA: Diagnosis not present

## 2021-06-05 DIAGNOSIS — Z1231 Encounter for screening mammogram for malignant neoplasm of breast: Secondary | ICD-10-CM

## 2021-06-05 MED ORDER — TRAZODONE HCL 100 MG PO TABS: ORAL_TABLET | ORAL | 0 refills | Status: DC

## 2021-06-05 MED ORDER — AMLODIPINE BESYLATE 5 MG PO TABS: 5.0000 mg | ORAL_TABLET | Freq: Every day | ORAL | 1 refills | Status: DC

## 2021-06-05 MED ORDER — GABAPENTIN 300 MG PO CAPS: 600.0000 mg | ORAL_CAPSULE | Freq: Three times a day (TID) | ORAL | 3 refills | Status: DC

## 2021-06-05 NOTE — Assessment & Plan Note (Signed)
OK for screening now boil has healed

## 2021-06-05 NOTE — Assessment & Plan Note (Signed)
Lab work today Discussion regarding weight loss options and depression mgmt

## 2021-06-05 NOTE — Assessment & Plan Note (Signed)
Has increased gabapentin; Rx changed to reflect

## 2021-06-05 NOTE — Progress Notes (Addendum)
Established patient visit   Patient: Ashley Mccullough   DOB: Jan 07, 1961   60 y.o. Female  MRN: [id number] Visit Date: 06/05/2021  Today's healthcare provider: Gwyneth Sprout, FNP   Chief Complaint  Patient presents with   Hypertension   Follow-up   Subjective    HPI  Right side had boil on it about a year ago. It has healed but patient wears bandage over it due to it being wrinkled and she does not like friction it causes on her side and would like to know if she could have a dermatology referral. -Has taken gabapentin 2 tabs 3xs daily sometimes Hypertension, follow-up  BP Readings from Last 3 Encounters:  06/05/21 (!) 158/93  04/29/21 (!) 175/88  04/11/21 (!) 178/67   Wt Readings from Last 3 Encounters:  06/05/21 (!) 346 lb 12.8 oz (157.3 kg)  04/11/21 (!) 337 lb 1.6 oz (152.9 kg)  02/12/21 (!) 331 lb 14.4 oz (150.5 kg)     She was last seen for hypertension 3 months ago. (02/12/21)  BP at that visit was 152/82. Management since that visit includes lifetsyle changes and then revisit.  She reports poor compliance with treatment. She is not having side effects.  She is following a Regular diet. She is not exercising. She does not smoke.  Use of agents associated with hypertension: none.   Outside blood pressures are none Symptoms: No chest pain No chest pressure  No palpitations No syncope  No dyspnea No orthopnea  No paroxysmal nocturnal dyspnea No lower extremity edema   Pertinent labs: Lab Results  Component Value Date   CHOL 245 (H) 11/25/2019   HDL 89 11/25/2019   LDLCALC 147 (H) 11/25/2019   TRIG 53 11/25/2019   CHOLHDL 2.8 07/02/2018   Lab Results  Component Value Date   NA 138 11/25/2019   K 4.7 11/25/2019   CREATININE 1.15 (H) 11/25/2019   GFRNONAA 53 (L) 11/25/2019   GFRAA 61 11/25/2019   GLUCOSE 92 11/25/2019     The 10-year ASCVD risk score (Arnett DK, et al., 2019) is: 11.2%    ---------------------------------------------------------------------------------------------------   Medications: Outpatient Medications Prior to Visit  Medication Sig   acetaminophen (TYLENOL) 650 MG CR tablet Take 650-1,300 mg by mouth at bedtime. As needed for arthritis.   buPROPion (WELLBUTRIN XL) 150 MG 24 hr tablet Take 1 tablet (150 mg total) by mouth daily.   Cetirizine HCl 10 MG CAPS Take by mouth.   Cholecalciferol (VITAMIN D3) 5000 units TABS Take 5,000 tablets by mouth daily.   COLLAGEN PO Take 40 mg by mouth daily.   FLUoxetine (PROZAC) 20 MG capsule TAKE 3 CAPSULES(60 MG) BY MOUTH DAILY   hydrOXYzine (ATARAX/VISTARIL) 25 MG tablet Take 1 tablet (25 mg total) by mouth every 4 (four) hours as needed for anxiety.   losartan-hydrochlorothiazide (HYZAAR) 100-25 MG tablet Take 1 tablet by mouth daily.   meloxicam (MOBIC) 15 MG tablet TAKE 1 TABLET(15 MG) BY MOUTH DAILY   metaxalone (SKELAXIN) 800 MG tablet Take 0.5-1 tablets (400-800 mg total) by mouth 3 (three) times daily as needed for muscle spasms.   metoprolol succinate (TOPROL-XL) 25 MG 24 hr tablet TAKE 1 TABLET(25 MG) BY MOUTH DAILY   montelukast (SINGULAIR) 10 MG tablet TAKE 1 TABLET BY MOUTH DAILY   traMADol (ULTRAM) 50 MG tablet 1/2-1 po bid prn   [DISCONTINUED] gabapentin (NEURONTIN) 300 MG capsule Take 1 capsule (300 mg total) by mouth 3 (three) times daily.  No facility-administered medications prior to visit.    Review of Systems     Objective    BP (!) 158/93 (BP Location: Right Wrist, Patient Position: Sitting, Cuff Size: Large)   Pulse 70   Temp 98.4 F (36.9 C)   Wt (!) 346 lb 12.8 oz (157.3 kg)   BMI 65.53 kg/m  {Show previous vital signs (optional):23777}  Physical Exam Vitals and nursing note reviewed.  Constitutional:      General: She is not in acute distress.    Appearance: Normal appearance. She is obese. She is not ill-appearing, toxic-appearing or diaphoretic.  HENT:     Head:  Normocephalic and atraumatic.  Cardiovascular:     Rate and Rhythm: Normal rate and regular rhythm.     Pulses: Normal pulses.     Heart sounds: Normal heart sounds. No murmur heard.   No friction rub. No gallop.  Pulmonary:     Effort: Pulmonary effort is normal. No respiratory distress.     Breath sounds: Normal breath sounds. No stridor. No wheezing, rhonchi or rales.  Chest:     Chest wall: No tenderness.  Abdominal:     General: Bowel sounds are normal.     Palpations: Abdomen is soft.  Musculoskeletal:        General: No swelling, tenderness, deformity or signs of injury. Normal range of motion.     Right lower leg: 2+ Pitting Edema present.     Left lower leg: 2+ Pitting Edema present.  Skin:    General: Skin is warm and dry.     Capillary Refill: Capillary refill takes less than 2 seconds.     Coloration: Skin is not jaundiced or pale.     Findings: No bruising, erythema, lesion or rash.          Comments: Healed sites of boils; pt had covered with bandage- advised can be left OTA  Neurological:     General: No focal deficit present.     Mental Status: She is alert and oriented to person, place, and time. Mental status is at baseline.     Cranial Nerves: No cranial nerve deficit.     Sensory: No sensory deficit.     Motor: No weakness.     Coordination: Coordination normal.  Psychiatric:        Mood and Affect: Mood normal.        Behavior: Behavior normal.        Thought Content: Thought content normal.        Judgment: Judgment normal.    No results found for any visits on 06/05/21.  Assessment & Plan     Problem List Items Addressed This Visit       Cardiovascular and Mediastinum   Hypertension - Primary    Remains elevated Endorses lots of eating out/carrying in Plan to start norvasc 1 month f/u RTC      Relevant Medications   amLODipine (NORVASC) 5 MG tablet     Nervous and Auditory   Lumbar radiculitis    Has increased gabapentin; Rx changed  to reflect      Relevant Medications   gabapentin (NEURONTIN) 300 MG capsule   traZODone (DESYREL) 100 MG tablet     Other   Cannot sleep    Sleep study ordered; trial of trazodone      Relevant Medications   traZODone (DESYREL) 100 MG tablet   Fatigue    Lab work today Discussion regarding weight loss options and depression  mgmt      Relevant Orders   CBC with Differential/Platelet   Comprehensive metabolic panel   Hemoglobin A1c   Lipid panel   Magnesium   T4, free   TSH   BMI 60.0-69.9, adult ([place])    Plan to f/u 3 areas of focus in 1 month; Will determine within 1 week Discussed importance of healthy weight management Discussed diet and exercise       Hypersomnia    Sleep study ordered; trial of trazodone       Relevant Orders   Ambulatory referral to Sleep Studies   Encounter for screening mammogram for malignant neoplasm of breast    OK for screening now boil has healed      Relevant Orders   MM 3D SCREEN BREAST BILATERAL     Return in about 4 weeks (around 07/03/2021) for blood pressure check.     Vonna Kotyk, FNP, have reviewed all documentation for this visit. The documentation on 06/05/21 for the exam, diagnosis, procedures, and orders are all accurate and complete.    Gwyneth Sprout, [place] [phone] (phone) [phone] (fax)  [facility]

## 2021-06-05 NOTE — Assessment & Plan Note (Signed)
Sleep study ordered; trial of trazodone

## 2021-06-05 NOTE — Assessment & Plan Note (Signed)
Remains elevated Endorses lots of eating out/carrying in Plan to start norvasc 1 month f/u RTC

## 2021-06-05 NOTE — Assessment & Plan Note (Signed)
Plan to f/u 3 areas of focus in 1 month; Will determine within 1 week Discussed importance of healthy weight management Discussed diet and exercise

## 2021-06-06 ENCOUNTER — Telehealth: Payer: Self-pay

## 2021-06-06 ENCOUNTER — Other Ambulatory Visit: Payer: Self-pay | Admitting: Family Medicine

## 2021-06-06 DIAGNOSIS — N184 Chronic kidney disease, stage 4 (severe): Secondary | ICD-10-CM | POA: Insufficient documentation

## 2021-06-06 LAB — LIPID PANEL
Chol/HDL Ratio: 3.6 ratio (ref 0.0–4.4)
Cholesterol, Total: 273 mg/dL — ABNORMAL HIGH (ref 100–199)
HDL: 75 mg/dL (ref >39)
LDL Chol Calc (NIH): 184 mg/dL — ABNORMAL HIGH (ref 0–99)
Triglycerides: 83 mg/dL (ref 0–149)
VLDL Cholesterol Cal: 14 mg/dL (ref 5–40)

## 2021-06-06 LAB — CBC WITH DIFFERENTIAL/PLATELET
Basophils Absolute: 0 10*3/uL (ref 0.0–0.2)
Basos: 0 %
EOS (ABSOLUTE): 0.4 10*3/uL (ref 0.0–0.4)
Eos: 5 %
Hematocrit: 37.6 % (ref 34.0–46.6)
Hemoglobin: 12 g/dL (ref 11.1–15.9)
Immature Grans (Abs): 0 10*3/uL (ref 0.0–0.1)
Immature Granulocytes: 0 %
Lymphocytes Absolute: 1.7 10*3/uL (ref 0.7–3.1)
Lymphs: 21 %
MCH: 28.9 pg (ref 26.6–33.0)
MCHC: 31.9 g/dL (ref 31.5–35.7)
MCV: 91 fL (ref 79–97)
Monocytes Absolute: 0.6 10*3/uL (ref 0.1–0.9)
Monocytes: 7 %
Neutrophils Absolute: 5.3 10*3/uL (ref 1.4–7.0)
Neutrophils: 67 %
Platelets: 281 10*3/uL (ref 150–450)
RBC: 4.15 x10E6/uL (ref 3.77–5.28)
RDW: 13.7 % (ref 11.7–15.4)
WBC: 7.9 10*3/uL (ref 3.4–10.8)

## 2021-06-06 LAB — COMPREHENSIVE METABOLIC PANEL
ALT: 13 IU/L (ref 0–32)
AST: 16 IU/L (ref 0–40)
Albumin/Globulin Ratio: 1.3 (ref 1.2–2.2)
Albumin: 4.1 g/dL (ref 3.8–4.9)
Alkaline Phosphatase: 114 IU/L (ref 44–121)
BUN/Creatinine Ratio: 15 (ref 12–28)
BUN: 30 mg/dL — ABNORMAL HIGH (ref 8–27)
Bilirubin Total: 0.3 mg/dL (ref 0.0–1.2)
CO2: 22 mmol/L (ref 20–29)
Calcium: 9.4 mg/dL (ref 8.7–10.3)
Chloride: 106 mmol/L (ref 96–106)
Creatinine, Ser: 1.97 mg/dL — ABNORMAL HIGH (ref 0.57–1.00)
Globulin, Total: 3.2 g/dL (ref 1.5–4.5)
Glucose: 93 mg/dL (ref 65–99)
Potassium: 4.9 mmol/L (ref 3.5–5.2)
Sodium: 141 mmol/L (ref 134–144)
Total Protein: 7.3 g/dL (ref 6.0–8.5)
eGFR: 29 mL/min/{1.73_m2} — ABNORMAL LOW (ref >59)

## 2021-06-06 LAB — TSH: TSH: 3.36 u[IU]/mL (ref 0.450–4.500)

## 2021-06-06 LAB — HEMOGLOBIN A1C
Est. average glucose Bld gHb Est-mCnc: 131 mg/dL
Hgb A1c MFr Bld: 6.2 % — ABNORMAL HIGH (ref 4.8–5.6)

## 2021-06-06 LAB — MAGNESIUM: Magnesium: 2.5 mg/dL — ABNORMAL HIGH (ref 1.6–2.3)

## 2021-06-06 LAB — T4, FREE: Free T4: 0.9 ng/dL (ref 0.82–1.77)

## 2021-06-06 NOTE — Telephone Encounter (Signed)
Patient advised as below.  

## 2021-06-06 NOTE — Telephone Encounter (Signed)
-----   Message from Gwyneth Sprout, FNP sent at 06/06/2021  8:02 AM EDT ----- Ashley Mccullough kidney function has drastically declined; referral to nephro.  ----- Message ----- From: Lavone Neri Lab Results In Sent: 06/06/2021   5:38 AM EDT To: Gwyneth Sprout, FNP

## 2021-06-24 ENCOUNTER — Encounter: Payer: Self-pay | Admitting: General Surgery

## 2021-07-02 ENCOUNTER — Encounter: Payer: Self-pay | Admitting: Physical Therapy

## 2021-07-02 ENCOUNTER — Ambulatory Visit: Payer: 59 | Attending: Student in an Organized Health Care Education/Training Program | Admitting: Physical Therapy

## 2021-07-02 DIAGNOSIS — M5441 Lumbago with sciatica, right side: Secondary | ICD-10-CM | POA: Insufficient documentation

## 2021-07-02 DIAGNOSIS — M5442 Lumbago with sciatica, left side: Secondary | ICD-10-CM | POA: Insufficient documentation

## 2021-07-02 DIAGNOSIS — G8929 Other chronic pain: Secondary | ICD-10-CM | POA: Diagnosis present

## 2021-07-02 DIAGNOSIS — M25561 Pain in right knee: Secondary | ICD-10-CM | POA: Insufficient documentation

## 2021-07-02 DIAGNOSIS — R262 Difficulty in walking, not elsewhere classified: Secondary | ICD-10-CM

## 2021-07-02 DIAGNOSIS — M25562 Pain in left knee: Secondary | ICD-10-CM | POA: Insufficient documentation

## 2021-07-02 DIAGNOSIS — M6281 Muscle weakness (generalized): Secondary | ICD-10-CM | POA: Insufficient documentation

## 2021-07-02 NOTE — Therapy (Signed)
[place] [facility] [address], [place], [address] Phone: [phone]   Fax:  [phone]  Physical Therapy Evaluation  Patient Details  Name: Ashley Mccullough MRN: [id number] Date of Birth: [birth year] Referring Provider (PT): Gillis Santa, MD   Encounter Date: 07/02/2021   PT End of Session - 07/02/21 1545     Visit Number 1    Number of Visits 24    Date for PT Re-Evaluation 09/24/21    Authorization Type [employer] reporting period from 07/02/2021    Authorization Time Period 30 max combined PT/OT/SLP per year    Authorization - Visit Number 1    Authorization - Number of Visits 30    Progress Note Due on Visit 10    PT Start Time 1430    PT Stop Time 1517    PT Time Calculation (min) 47 min    Activity Tolerance Patient limited by fatigue;Patient limited by pain    Behavior During Therapy [facility] for tasks assessed/performed             Past Medical History:  Diagnosis Date   Allergy    Anxiety    Depression    Hyperlipidemia    Hypertension     Past Surgical History:  Procedure Laterality Date   ABDOMINAL HYSTERECTOMY     still has ovaries   COLONOSCOPY WITH PROPOFOL N/A 12/23/2019   Procedure: COLONOSCOPY WITH PROPOFOL;  Surgeon: Jonathon Bellows, MD;  Location: [facility] ENDOSCOPY;  Service: Gastroenterology;  Laterality: N/A;    There were no vitals filed for this visit.    Subjective Assessment - 07/02/21 1426     Subjective Patient states condition started maybe in 2020 but she went on short term disability in June 2021. She was having pains shooting down the back of both legs, particularly in the morning when she was getting up. She couldn't really move and it took her a while to get up and start walking. After a few weeks or months she went to the doctor where an MRI was performed resulting in a diagnosis of spinal stenosis. She has been dealing with that henceforth. She was getting  epidural shots from Dr. Sharlet Salina. They helped at first but then stopped lasting as long. She decided to go to the pain clinic where she saw Dr. Holley Raring. Since she has not had any PT for this condition at all so "he signed me up" for she thinks 8-10 weeks after which time she is to return to Dr. Holley Raring. As far as her knees go, she has been told she need bilateral TKA but she must get her weight down first. She has had injections in both knees either this or last year. States she could not tell a big difference in her knee pain with the injections. Thinks it may have helped slightly. States her pain first started in back and then moved down to knees. States it's hard to tell the two apart. 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 She thinks this contributed to her current conditions. No major traumatic injuries that she recalls. She lives on the 3rd floor and walks up and down stairs to get there. She has been using a SPC for the last 2-3 years because she needed it to get to the bathroom without having an accident. She currently uses it when out of the house. She holds onto  the walls in the home. She did not have to do this prior to her back and knee pain. She did fall last week (but that was the only one in the last 6 months). She reaching forward in a chair and the chair slid out from behind her. She fell on the floor and it took her a while to get up. She did hurt her right shoulder while trying to get up. She lives alone. She is having trouble keeping her apartment clean which causes increased fall risk. Has a shower chair that needs to be put together. She currently cannot cook for herself or care for her home properly. States she orders out a lot and orders prepared meals. She can make sandwiches. She is starting to work with the lifestyle center to manage her weight. Sees a mental health therapist for depression. Reports she has difficulty  focusing to read (her pastime). Does have some difficulty with focus, feeling "foggy" etc. Muscle relaxer helps pain a bit but she hates the side effects. Also only intermittently helps. Gabapentin doesn't seem to help much. She hates meloxicam that gives her more relief than muscle relaxant but it hurts stomach and caused blood in stool. Doesn't like the way pain meds make her feel. Feels like she is going to the bathroom all the time and has a BM when she urinates. Does feel like this is worse when her back pain is worse. Has notices a slight tremor in her left hand when she is getting sleepy. Later remembers she has history of paresthesia over left thigh.    Pertinent History Patient is a 60 y.o. female who presents to outpatient physical therapy with a referral for medical diagnosis chronic pain syndrome, bilateral primary osteoarthritis of knee, chronic radicular lumbar pain, lumbar facet arthropathy, lumbar spondylosis, sacroiliac joint pain. This patient's chief complaints consist of bilateral posterior thigh, hip, and sometimes low back pain and B anterior knee pain leading to the following functional deficits: difficulty with household and community mobility, home care, stairs, cooking, bathing, dressing, bending, lifting, not falling, sleeping, transferring, and sitting.  Relevant past medical history and comorbidities include morbid obesity, depression, HTN, anxiety, CKD stage 4 (appointment in December), pre-diabetes.  Patient denies hx of cancer, stroke, seizures, lung problem, major cardiac events, unexplained weight loss, new onset stumbling or dropping things, spinal surgery.    Limitations Sitting;Reading;Lifting;Standing;Walking;House hold activities;Other (comment)   difficulty with household and community mobility, home care, stairs, cooking, bathing, dressing, bending, lifting, not falling, sleeping, transferring, sitting.   Diagnostic tests Lumbar MRI report 04/24/2021: "IMPRESSION:  1.  Moderate central and bilateral foraminal stenosis at L4-5  secondary to uncovering of broad-based disc protrusion associated  with grade 1 anterolisthesis and advanced facet hypertrophy.  Previous plain film radiographs demonstrate dynamic listhesis at  this level which likely results in more significant stenosis when  the listhesis is worse.  2. Mild bilateral foraminal narrowing at L3-4 and L5-S1 as  described."    Currently in Pain? Yes    Pain Score 8    W: 10/10; B: 7/10   Pain Location Other (Comment)   Pain is from B buttocks down back of thigh to knees, L > R, sometimes in both hips  Knee pain is in the front of the knees.   Pain Orientation Right;Left    Pain Descriptors / Indicators Sharp;Other (Comment)   "like a wishbone being pulled apart" feels afraid she is going to move the wrong way, stiffness   Pain  Type Chronic pain    Pain Radiating Towards back of thighs to knees, denies tingling    Pain Onset More than a month ago    Pain Frequency Constant    Aggravating Factors  prolonged standing, prolonged sitting, lying in a certain position (on her back), doing anything up on her feet, stairs, bending, lifting.    Pain Relieving Factors laying on side and moving legs up and down, moving around until she finds some relief in some way while laying in the bed, gabapentin but doesn't seem to really help that much.    Effect of Pain on Daily Activities Functional Limitations: household and community mobility, home care, stairs, cooking, bathing, dressing, bending, lifting, not falling, sleeping, transferring, sitting.                [facility] PT Assessment - 07/02/21 1425       Assessment   Medical Diagnosis chronic pain syndrome, bilateral primary osteoarthritis of knee, chronic radicular lumbar pain, lumbar facet arthropathy, lumbar spondylosis, sacroiliac joint pain.    Referring Provider (PT) Gillis Santa, MD    Onset Date/Surgical Date --   2-3 years ago   Next MD Visit after PT  is done    Prior Therapy no      Precautions   Precautions Fall      Balance Screen   Has the patient fallen in the past 6 months Yes    How many times? 1    Has the patient had a decrease in activity level because of a fear of falling?  Yes    Is the patient reluctant to leave their home because of a fear of falling?  Yes      [place] residence    Living Arrangements Alone    Available Help at Discharge Other (Comment)   none   Type of [place] to enter    Entrance Stairs-Number of Steps 3rd floor, 2 flights (15 steps per flight?)    Entrance Stairs-Rails Right;Left;Can reach both    Home Layout One level    [place] - single point;Tub bench   shower bench needs to be put together.     Prior Function   Vocation On disability   for back and knee pain, and depression; working on Fish farm manager disability.   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  Posture (standing): forward flexed at lumbopelvic/hip region with compensatory upper spinal extension, chest well forward of pelvis. Anthropometrics Tremor: none Body composition: morbid obesity Functional Mobility Bed mobility: not tested Transfers: sit <> stand mod I with B UE support, painful Gait: ambulates short distances mod I with SPC in R UE; Demo moderate bilateral compensated trendelenburg gait, forward flexed at lumbopelvic//hip region with compensatory upper spinal extension to be upright, wide base of  support, slow pace, limited step length, fatigues quickly.   NEUROLOGICAL Upper Motor Neuron Screen Hoffman's and Clonus (ankle) negative bilaterally.  Dermatomes L2-L4 diminished to  light touch on left compared to right, L5-S2 equal to light touch.   PERIPHERAL JOINT MOTION  PROM B LE appears grossly WFL for basic mobility except likely restrictions in hip extension that limits patient's ability to stand fully upright.   MUSCLE PERFORMANCE (MMT):  *Indicates pain 07/02/21 Date Date  Joint/Motion R/L R/L R/L  Knee     Extension (L3) 4+*/4* / /  Flexion (S2) 4+/3* / /  Ankle/Foot     Dorsiflexion (L4) 4/4+ / /  Great toe extension (L5) 4-/4- / /  Eversion (S1) 4+/4 / /  Plantarflexion (S1) 4/4+ / /  Comments:  07/02/2021: pain at ipsilateral anterior knee with knee extension bilaterally, pain at left lateral thigh with L knee flexion.   SPECIAL TESTS: LOWER LIMB NEURODYNAMIC Slump Test (entire nervous system)  R = possibly positive  L = negative to sensitizing manuver  FUNCTIONAL/BALANCE TESTS: Five Time Sit to Stand (5TSTS): 32.11 seconds from 18.5 inch plinth using L UE on plinth, and right UE on SPC. Did not come to complete stand first rep. Pain in B posterior legs and knees. Feels like she needs to use the bathroom now.      PT Education - 07/02/21 1545     Education Details Education on diagnosis, prognosis, POC, anatomy and physiology of current condition    Person(s) Educated Patient    Methods Explanation    Comprehension Verbalized understanding;Need further instruction              PT Short Term Goals - 07/02/21 1555       PT SHORT TERM GOAL #1   Title Be independent with initial home exercise program for self-management of symptoms.    Baseline Initial HEP to be provided at visti 2 as appropriate (07/02/2021);    Time 2    Period Weeks    Status New    Target Date 07/16/21               PT Long Term Goals - 07/02/21 1555       PT LONG  TERM GOAL #1   Title Be independent with a long-term home exercise program for self-management of symptoms.    Baseline Initial HEP to be provided at visti 2 as appropriate (07/02/2021);    Time 12    Period Weeks    Status New   TARGET DATE FOR ALL LONG TERM GOALS: 09/24/2021     PT LONG TERM GOAL #2   Title Demonstrate improved FOTO score by 10 units to demonstrate improvement in overall condition and self-reported functional ability.    Baseline to be tested at visit 2 as appropriate (07/02/2021);    Time 12    Period Weeks    Status New      PT LONG TERM GOAL #3   Title Patient will complete 5 Time Sit To Stand Test in equal or less than 15 seconds from 18.5 inch surface or lower with LRAD to improve functional transfers and household mobility.    Baseline 32.11 seconds from 18.5 inch plinth using L UE on plinth, and right UE on SPC. Did not come to complete stand first rep. (07/02/2021);    Time 12    Period Weeks    Status New      PT LONG TERM GOAL #4   Title Patient will amublate at least 600 feet during 6 Minute Walk Test with LRAD to improve community and househould mobility.    Baseline difficulty ambulating 100  feet from vehicle to clinic with [facility] (07/02/2021);    Time 12    Period Weeks    Status New      PT LONG TERM GOAL #5   Title Complete community, work and/or recreational activities with 50% less limitation due to current condition    Baseline difficulty with household and community mobility, home care, stairs, cooking, bathing, dressing, bending, lifting, not falling, sleeping, transferring, sitting (07/02/2021);    Time 12    Period Weeks    Status New                    Plan - 07/02/21 1824     Clinical Impression Statement Patient is a 60 y.o. female referred to outpatient physical therapy with a medical diagnosis of chronic pain syndrome, bilateral primary osteoarthritis of knee, chronic radicular lumbar pain, lumbar facet arthropathy, lumbar  spondylosis, sacroiliac joint pain who presents with signs and symptoms consistent with chronic low back pain with radiation to bilateral posterior thighs and B chronic knee pain in the setting of generalized deconditioning and morbid obesity that restricts function. Patients low back and posterior leg pain presentation consistent with possible symptomatic lumbar stenosis. Patient demonstrates significant difficulty with functional mobility including transfers, and household and community mobility. She required 32 seconds to complete 5 Time Sit To Stand suggesting increased fall risk and significantly limited LE/functional strength. She had difficulty ambulating up to 100 feet from vehicle to clinic and needed frequent rests when ambulating in clinic (~50 foot distance). Patient presents with significant pain, joint stiffness, ROM, posture, balance, sensation, neural tension, muscle performance (strength/power/endurance) and activity tolerance impairments that are limiting ability to complete usual activities including household and community mobility, housework, stairs, cooking, bathing, dressing, bending, lifting, not falling, sleeping, transferring, sitting, and keeping apartment clear of tripping obstacles without difficulty. Due to her condition, patient has high fall risk and decreased quality of life. She is currently not a surgical candidate due to excessive body weight/adipose tissue but has difficulty with preparing healthy meals, caring for herself, sleeping, and exercising that are components needed for effective weight loss. Patient will benefit from skilled physical therapy intervention to address current body structure impairments and activity limitations to improve function and work towards goals set in current POC in order to return to prior level of function or maximal functional improvement.    Personal Factors and Comorbidities Past/Current Experience;Fitness;Comorbidity 3+;Time since onset of  injury/illness/exacerbation    Comorbidities Relevant past medical history and comorbidities include morbid obesity, depression, HTN, anxiety, CKD stage 4 (appointment in December), pre-diabetes    Examination-Activity Limitations Bathing;Squat;Hygiene/Grooming;Bed Mobility;Lift;Stairs;Bend;Locomotion Level;Stand;Carry;Sit;Dressing;Sleep;Transfers    Examination-Participation Restrictions Laundry;Cleaning;Shop;Community Activity;Meal Prep;Interpersonal Relationship   difficulty with household and community mobility, home care, stairs, cooking, bathing, dressing, bending, lifting, not falling, sleeping, transferring, sitting.   Stability/Clinical Decision Making Evolving/Moderate complexity    Clinical Decision Making Moderate    Rehab Potential Fair    PT Frequency 2x / week    PT Duration 12 weeks    PT Treatment/Interventions ADLs/Self Care Home Management;Biofeedback;Aquatic Therapy;Cryotherapy;Moist Heat;Electrical Stimulation;DME Instruction;Gait training;Stair training;Functional mobility training;Therapeutic activities;Therapeutic exercise;Balance training;Neuromuscular re-education;Manual techniques;Dry needling;Passive range of motion;Joint Manipulations;Spinal Manipulations;Energy conservation    PT Next Visit Plan establish HEP, test 6MWT, graded exercise for LE/postural strengthening and mobility    PT Home Exercise Plan TBD    Consulted and Agree with Plan of Care Patient             Patient will benefit from skilled therapeutic  intervention in order to improve the following deficits and impairments:  Abnormal gait, Decreased knowledge of use of DME, Impaired sensation, Improper body mechanics, Pain, Postural dysfunction, Increased muscle spasms, Hypermobility, Decreased mobility, Decreased activity tolerance, Decreased endurance, Decreased range of motion, Decreased strength, Hypomobility, Impaired perceived functional ability, Obesity, Impaired flexibility, Difficulty walking,  Decreased balance  Visit Diagnosis: Chronic bilateral low back pain with bilateral sciatica  Chronic pain of right knee  Chronic pain of left knee  Difficulty in walking, not elsewhere classified  Muscle weakness (generalized)     Problem List Patient Active Problem List   Diagnosis Date Noted   CKD (chronic kidney disease) stage 4, GFR 15-29 ml/min ([place]) 06/06/2021   Hypersomnia 06/05/2021   Lumbar radiculitis 06/05/2021   Encounter for screening mammogram for malignant neoplasm of breast 06/05/2021   Chronic pain syndrome 04/11/2021   Bilateral primary osteoarthritis of knee 04/11/2021   Lumbar spondylosis 04/11/2021   Sacroiliac joint pain 04/11/2021   Chronic pain of both knees 06/21/2020   Chronic radicular lumbar pain 11/18/2019   Spinal stenosis of lumbar region with neurogenic claudication 11/18/2019   DDD (degenerative disc disease), lumbosacral 11/18/2019   Mild episode of recurrent major depressive disorder ([place]) 10/26/2018   BMI 60.0-69.9, adult ([place]) 07/01/2017   Morbid obesity due to excess calories ([place]) 12/24/2015   Allergic rhinitis 05/29/2015   Anxiety, generalized 05/29/2015   D (diarrhea) 05/29/2015   Avitaminosis D 05/29/2015   Prediabetes 05/29/2015   Fatigue 05/29/2015   Hypertension 03/15/2015   Cannot sleep 11/13/2009   Congenital pes planus 03/13/2008   Hypercholesterolemia without hypertriglyceridemia 09/16/2006    Clarise Cruz R. Graylon Good, PT, DPT 07/02/21, 6:29 PM   [place] [facility] [address] [place], [address] Phone: [phone]   Fax:  [phone]  Name: Ashley Mccullough MRN: [id number] Date of Birth: 1961/03/20

## 2021-07-04 ENCOUNTER — Encounter: Payer: 59 | Admitting: Physical Therapy

## 2021-07-05 ENCOUNTER — Ambulatory Visit: Payer: 59 | Admitting: Dietician

## 2021-07-09 ENCOUNTER — Ambulatory Visit: Payer: 59 | Admitting: Physical Therapy

## 2021-07-11 ENCOUNTER — Encounter: Payer: 59 | Admitting: Physical Therapy

## 2021-07-16 ENCOUNTER — Ambulatory Visit: Payer: 59 | Admitting: Physical Therapy

## 2021-07-18 ENCOUNTER — Encounter: Payer: 59 | Admitting: Physical Therapy

## 2021-07-23 ENCOUNTER — Telehealth: Payer: Self-pay | Admitting: Physical Therapy

## 2021-07-23 ENCOUNTER — Other Ambulatory Visit: Payer: Self-pay | Admitting: Family Medicine

## 2021-07-23 ENCOUNTER — Ambulatory Visit: Payer: 59 | Attending: Student in an Organized Health Care Education/Training Program | Admitting: Physical Therapy

## 2021-07-23 DIAGNOSIS — M6281 Muscle weakness (generalized): Secondary | ICD-10-CM | POA: Insufficient documentation

## 2021-07-23 DIAGNOSIS — R262 Difficulty in walking, not elsewhere classified: Secondary | ICD-10-CM | POA: Insufficient documentation

## 2021-07-23 DIAGNOSIS — M25561 Pain in right knee: Secondary | ICD-10-CM | POA: Insufficient documentation

## 2021-07-23 DIAGNOSIS — M25562 Pain in left knee: Secondary | ICD-10-CM | POA: Insufficient documentation

## 2021-07-23 DIAGNOSIS — F3341 Major depressive disorder, recurrent, in partial remission: Secondary | ICD-10-CM

## 2021-07-23 DIAGNOSIS — G8929 Other chronic pain: Secondary | ICD-10-CM | POA: Insufficient documentation

## 2021-07-23 DIAGNOSIS — M5442 Lumbago with sciatica, left side: Secondary | ICD-10-CM | POA: Insufficient documentation

## 2021-07-23 DIAGNOSIS — M5441 Lumbago with sciatica, right side: Secondary | ICD-10-CM | POA: Insufficient documentation

## 2021-07-23 NOTE — Telephone Encounter (Signed)
Called patient when she did not come to her appointment scheduled for 2:30pm today. Patient answered, apologized for the missed visit, and said she thought it was on Thursday at 2:30pm. Confirmed she does have her next scheduled appointment at that time and she said she plans to come.  Ashley Mccullough. Graylon Good, PT, DPT 07/23/21, 2:53 PM

## 2021-07-24 NOTE — Telephone Encounter (Signed)
Requested Prescriptions  Pending Prescriptions Disp Refills  . FLUoxetine (PROZAC) 20 MG capsule [Pharmacy Med Name: FLUOXETINE 20MG  CAPSULES] 270 capsule 0    Sig: TAKE 3 CAPSULES(60 MG) BY MOUTH DAILY     Psychiatry:  Antidepressants - SSRI Passed - 07/23/2021  5:55 PM      Passed - Completed PHQ-2 or PHQ-9 in the last 360 days      Passed - Valid encounter within last 6 months    Recent Outpatient Visits          1 month ago Primary hypertension   [facility] Gwyneth Sprout, FNP   5 months ago Primary hypertension   [facility] [place], Dionne Bucy, MD   6 months ago Primary hypertension   [place], Kelby Aline, FNP   10 months ago Primary hypertension   [facility] [place], Clearnce Sorrel, [place]   1 year ago Essential hypertension   [place], Clearnce Sorrel, [place]      Future Appointments            In 3 weeks [place], Dionne Bucy, MD [facility], [place]

## 2021-07-25 ENCOUNTER — Ambulatory Visit: Payer: 59 | Admitting: Physical Therapy

## 2021-07-25 ENCOUNTER — Other Ambulatory Visit: Payer: Self-pay

## 2021-07-25 ENCOUNTER — Encounter: Payer: Self-pay | Admitting: Physical Therapy

## 2021-07-25 DIAGNOSIS — G8929 Other chronic pain: Secondary | ICD-10-CM | POA: Diagnosis present

## 2021-07-25 DIAGNOSIS — M25561 Pain in right knee: Secondary | ICD-10-CM | POA: Diagnosis present

## 2021-07-25 DIAGNOSIS — M6281 Muscle weakness (generalized): Secondary | ICD-10-CM

## 2021-07-25 DIAGNOSIS — R262 Difficulty in walking, not elsewhere classified: Secondary | ICD-10-CM | POA: Diagnosis present

## 2021-07-25 DIAGNOSIS — M5442 Lumbago with sciatica, left side: Secondary | ICD-10-CM | POA: Diagnosis not present

## 2021-07-25 DIAGNOSIS — M5441 Lumbago with sciatica, right side: Secondary | ICD-10-CM | POA: Diagnosis present

## 2021-07-25 DIAGNOSIS — M25562 Pain in left knee: Secondary | ICD-10-CM | POA: Diagnosis present

## 2021-07-25 NOTE — Therapy (Signed)
[facility] [address] [place], [address] Phone: [phone]   Fax:  [phone]  Physical Therapy Treatment  Patient Details  Name: Ashley Mccullough MRN: [id number] Date of Birth: 1961-06-26 Referring Provider (PT): Gillis Santa, MD   Encounter Date: 07/25/2021   PT End of Session - 07/25/21 1443     Visit Number 2    Number of Visits 24    Date for PT Re-Evaluation 09/24/21    Authorization Type [employer] reporting period from 07/02/2021    Authorization Time Period 30 max combined PT/OT/SLP per year    Authorization - Visit Number 2    Authorization - Number of Visits 30    Progress Note Due on Visit 10    PT Start Time 1430    PT Stop Time 1510    PT Time Calculation (min) 40 min    Activity Tolerance Patient limited by fatigue;Patient limited by pain    Behavior During Therapy [facility] for tasks assessed/performed             Past Medical History:  Diagnosis Date   Allergy    Anxiety    Depression    Hyperlipidemia    Hypertension     Past Surgical History:  Procedure Laterality Date   ABDOMINAL HYSTERECTOMY     still has ovaries   COLONOSCOPY WITH PROPOFOL N/A 12/23/2019   Procedure: COLONOSCOPY WITH PROPOFOL;  Surgeon: Jonathon Bellows, MD;  Location: [facility] ENDOSCOPY;  Service: Gastroenterology;  Laterality: N/A;    There were no vitals filed for this visit.   Subjective Assessment - 07/25/21 1432     Subjective Patient reports she feels pain 7/10 all over upon arrival. States she used her arms a lot when she came down the stairs to come to her visit today. She fell while tying up the trash since last PT session and that caused her to miss some appointments as well as she was sick. She feels like she is sitting on a bone or something under her left buttock when she was sitting. [facility] with [facility]. When she last fell she hurt her left shoulder and wrist the way she fell or when she  pulled her self up.    Pertinent History Patient is a 60 y.o. female who presents to outpatient physical therapy with a referral for medical diagnosis chronic pain syndrome, bilateral primary osteoarthritis of knee, chronic radicular lumbar pain, lumbar facet arthropathy, lumbar spondylosis, sacroiliac joint pain. This patient's chief complaints consist of bilateral posterior thigh, hip, and sometimes low back pain and B anterior knee pain leading to the following functional deficits: difficulty with household and community mobility, home care, stairs, cooking, bathing, dressing, bending, lifting, not falling, sleeping, transferring, and sitting.  Relevant past medical history and comorbidities include morbid obesity, depression, HTN, anxiety, CKD stage 4 (appointment in December), pre-diabetes.  Patient denies hx of cancer, stroke, seizures, lung problem, major cardiac events, unexplained weight loss, new onset stumbling or dropping things, spinal surgery.    Limitations Sitting;Reading;Lifting;Standing;Walking;House hold activities;Other (comment)   difficulty with household and community mobility, home care, stairs, cooking, bathing, dressing, bending, lifting, not falling, sleeping, transferring, sitting.   Diagnostic tests Lumbar MRI report 04/24/2021: "IMPRESSION:  1. Moderate central and bilateral foraminal stenosis at L4-5  secondary to uncovering of broad-based disc protrusion associated  with grade 1 anterolisthesis and advanced facet hypertrophy.  Previous plain film radiographs demonstrate dynamic listhesis at  this level which  likely results in more significant stenosis when  the listhesis is worse.  2. Mild bilateral foraminal narrowing at L3-4 and L5-S1 as  described."    Currently in Pain? Yes    Pain Score 8     Pain Onset More than a month ago            OBJECTIVE  6 Minute Walk test: 100 feet with SPC. SpO2 92% and HR 91 bpm about 2 min after sitting. Patient unclear about why she  needed to sit down.    TREATMENT:   Therapeutic exercise: to centralize symptoms and improve ROM, strength, muscular endurance, and activity tolerance required for successful completion of functional activities.  - ambulation around clinic for time in 6 minutes (see above).  - NuStep level 1 using bilateral upper and lower extremities. Seat/handle setting 9/6. For improved extremity mobility, muscular endurance, and activity tolerance; and to induce the analgesic effect of aerobic exercise, stimulate improved joint nutrition, and prepare body structures and systems for following interventions. x 10 minutes. Average SPM = 19. Reports pain in knees and thighs. Thinks pain in knees decreased with time.  - supine <> sit mod I  - hooklying LTR 1x20 each side.  - hooklying posterior pelvic tilt AROM, 1x20 each side - hooklying posterior pelvic tilt with 5 second hold, 1x20 - sitting posterior pelvic tilt, 1x20 with 5 second hold.  - sitting LAQ, 5 second holds, 1x5, 1x10 each side.  - Education on HEP including handout   Exercise purpose/form. Self management techniques. Education on diagnosis, prognosis, POC, anatomy and physiology of current condition Education on HEP including handout   HOME EXERCISE PROGRAM Access Code: [email or web address] URL: [email or web address] Date: 07/25/2021 Prepared by: Rosita Kea  Exercises Supine Lower Trunk Rotation - 1 x daily - 1 sets - 20 reps Supine Posterior Pelvic Tilt - 1 x daily - 1 sets - 20 reps - 1 breath/5 seconds hold Seated Pelvic Tilt - 2 x daily - 1 sets - 20 reps - 5 seconds hold Seated Long Arc Quad - 1 x daily - 2 sets - 10 reps - 5 seconds hold     PT Education - 07/25/21 1443     Education Details Exercise purpose/form. Self management techniques.    Person(s) Educated Patient    Methods Explanation;Demonstration;Tactile cues;Verbal cues    Comprehension Verbalized understanding;Returned demonstration;Verbal cues  required;Tactile cues required;Need further instruction              PT Short Term Goals - 07/25/21 1916       PT SHORT TERM GOAL #1   Title Be independent with initial home exercise program for self-management of symptoms.    Baseline Initial HEP to be provided at visti 2 as appropriate (07/02/2021); Initial HEP provided visit 2 (07/25/2021);    Time 2    Period Weeks    Status Partially Met    Target Date 07/16/21               PT Long Term Goals - 07/25/21 1915       PT LONG TERM GOAL #1   Title Be independent with a long-term home exercise program for self-management of symptoms.    Baseline Initial HEP to be provided at visti 2 as appropriate (07/02/2021); initial HEP provided visit 2 (07/25/2021);    Time 12    Period Weeks    Status Partially Met   TARGET DATE FOR ALL LONG TERM GOALS: 09/24/2021     PT  LONG TERM GOAL #2   Title Demonstrate improved FOTO score by 10 units to demonstrate improvement in overall condition and self-reported functional ability.    Baseline to be tested at visit 2 as appropriate (07/02/2021); 20 (07/25/2021);    Time 12    Period Weeks    Status On-going      PT LONG TERM GOAL #3   Title Patient will complete 5 Time Sit To Stand Test in equal or less than 15 seconds from 18.5 inch surface or lower with LRAD to improve functional transfers and household mobility.    Baseline 32.11 seconds from 18.5 inch plinth using L UE on plinth, and right UE on SPC. Did not come to complete stand first rep. (07/02/2021);    Time 12    Period Weeks    Status On-going      PT LONG TERM GOAL #4   Title Patient will amublate at least 600 feet during 6 Minute Walk Test with LRAD to improve community and househould mobility.    Baseline difficulty ambulating 100 feet from vehicle to clinic with [place] (07/02/2021); 100 feet with SPC (07/25/2021);    Time 12    Period Weeks    Status On-going      PT LONG TERM GOAL #5   Title Complete community, work  and/or recreational activities with 50% less limitation due to current condition    Baseline difficulty with household and community mobility, home care, stairs, cooking, bathing, dressing, bending, lifting, not falling, sleeping, transferring, sitting (07/02/2021);    Time 12    Period Weeks    Status On-going                   Plan - 07/25/21 1915     Clinical Impression Statement Patient tolerated treatment session with difficulty due to pain with all exercises at first. Reported she felt "tight" by end of session. Treatment focused on initiating gentle generalized activity for improved activity tolerance, lumbopelvic awareness working towards control, and quad strengthening. 6MWT demonstrated severe limitation in community ambulation and patient was only able to ambulate 100 feet before requesting to sit. Patient would benefit from continued management of limiting condition by skilled physical therapist to address remaining impairments and functional limitations to work towards stated goals and return to PLOF or maximal functional independence.    Personal Factors and Comorbidities Past/Current Experience;Fitness;Comorbidity 3+;Time since onset of injury/illness/exacerbation    Comorbidities Relevant past medical history and comorbidities include morbid obesity, depression, HTN, anxiety, CKD stage 4 (appointment in December), pre-diabetes    Examination-Activity Limitations Bathing;Squat;Hygiene/Grooming;Bed Mobility;Lift;Stairs;Bend;Locomotion Level;Stand;Carry;Sit;Dressing;Sleep;Transfers    Examination-Participation Restrictions Laundry;Cleaning;Shop;Community Activity;Meal Prep;Interpersonal Relationship   difficulty with household and community mobility, home care, stairs, cooking, bathing, dressing, bending, lifting, not falling, sleeping, transferring, sitting.   Stability/Clinical Decision Making Evolving/Moderate complexity    Rehab Potential Fair    PT Frequency 2x / week     PT Duration 12 weeks    PT Treatment/Interventions ADLs/Self Care Home Management;Biofeedback;Aquatic Therapy;Cryotherapy;Moist Heat;Electrical Stimulation;DME Instruction;Gait training;Stair training;Functional mobility training;Therapeutic activities;Therapeutic exercise;Balance training;Neuromuscular re-education;Manual techniques;Dry needling;Passive range of motion;Joint Manipulations;Spinal Manipulations;Energy conservation    PT Next Visit Plan graded exercise for LE/postural strengthening and mobility    PT Home Exercise Plan Medbridge Access Code: [email or web address]    Consulted and Agree with Plan of Care Patient             Patient will benefit from skilled therapeutic intervention in order to improve the following deficits and impairments:  Abnormal gait,  Decreased knowledge of use of DME, Impaired sensation, Improper body mechanics, Pain, Postural dysfunction, Increased muscle spasms, Hypermobility, Decreased mobility, Decreased activity tolerance, Decreased endurance, Decreased range of motion, Decreased strength, Hypomobility, Impaired perceived functional ability, Obesity, Impaired flexibility, Difficulty walking, Decreased balance  Visit Diagnosis: Chronic bilateral low back pain with bilateral sciatica  Chronic pain of right knee  Chronic pain of left knee  Difficulty in walking, not elsewhere classified  Muscle weakness (generalized)     Problem List Patient Active Problem List   Diagnosis Date Noted   CKD (chronic kidney disease) stage 4, GFR 15-29 ml/min ([place]) 06/06/2021   Hypersomnia 06/05/2021   Lumbar radiculitis 06/05/2021   Encounter for screening mammogram for malignant neoplasm of breast 06/05/2021   Chronic pain syndrome 04/11/2021   Bilateral primary osteoarthritis of knee 04/11/2021   Lumbar spondylosis 04/11/2021   Sacroiliac joint pain 04/11/2021   Chronic pain of both knees 06/21/2020   Chronic radicular lumbar pain 11/18/2019   Spinal stenosis of  lumbar region with neurogenic claudication 11/18/2019   DDD (degenerative disc disease), lumbosacral 11/18/2019   Mild episode of recurrent major depressive disorder ([place]) 10/26/2018   BMI 60.0-69.9, adult ([place]) 07/01/2017   Morbid obesity due to excess calories ([place]) 12/24/2015   Allergic rhinitis 05/29/2015   Anxiety, generalized 05/29/2015   D (diarrhea) 05/29/2015   Avitaminosis D 05/29/2015   Prediabetes 05/29/2015   Fatigue 05/29/2015   Hypertension 03/15/2015   Cannot sleep 11/13/2009   Congenital pes planus 03/13/2008   Hypercholesterolemia without hypertriglyceridemia 09/16/2006    Clarise Cruz R. Graylon Good, PT, DPT 07/25/21, 7:18 PM   [place] [address] [place], [address] Phone: [phone]   Fax:  [phone]  Name: Cheresa Siers MRN: [id number] Date of Birth: 12/09/1960

## 2021-07-30 ENCOUNTER — Ambulatory Visit: Payer: 59 | Admitting: Physical Therapy

## 2021-08-01 ENCOUNTER — Other Ambulatory Visit: Payer: Self-pay | Admitting: Nephrology

## 2021-08-01 ENCOUNTER — Ambulatory Visit: Payer: 59 | Admitting: Physical Therapy

## 2021-08-01 DIAGNOSIS — R82998 Other abnormal findings in urine: Secondary | ICD-10-CM

## 2021-08-01 DIAGNOSIS — I10 Essential (primary) hypertension: Secondary | ICD-10-CM

## 2021-08-01 DIAGNOSIS — N184 Chronic kidney disease, stage 4 (severe): Secondary | ICD-10-CM

## 2021-08-01 DIAGNOSIS — E785 Hyperlipidemia, unspecified: Secondary | ICD-10-CM

## 2021-08-01 DIAGNOSIS — D631 Anemia in chronic kidney disease: Secondary | ICD-10-CM

## 2021-08-02 ENCOUNTER — Encounter: Payer: Self-pay | Admitting: Family Medicine

## 2021-08-06 ENCOUNTER — Ambulatory Visit: Payer: 59 | Admitting: Physical Therapy

## 2021-08-07 ENCOUNTER — Other Ambulatory Visit: Payer: 59

## 2021-08-13 ENCOUNTER — Encounter: Payer: 59 | Admitting: Physical Therapy

## 2021-08-14 ENCOUNTER — Other Ambulatory Visit: Payer: Self-pay

## 2021-08-14 ENCOUNTER — Ambulatory Visit
Admission: RE | Admit: 2021-08-14 | Discharge: 2021-08-14 | Disposition: A | Discharge status: Home / Self Care | Payer: 59 | Source: Ambulatory Visit | Attending: Nephrology | Admitting: Nephrology

## 2021-08-14 DIAGNOSIS — E785 Hyperlipidemia, unspecified: Secondary | ICD-10-CM

## 2021-08-14 DIAGNOSIS — R82998 Other abnormal findings in urine: Secondary | ICD-10-CM | POA: Insufficient documentation

## 2021-08-14 DIAGNOSIS — I10 Essential (primary) hypertension: Secondary | ICD-10-CM | POA: Insufficient documentation

## 2021-08-14 DIAGNOSIS — N184 Chronic kidney disease, stage 4 (severe): Secondary | ICD-10-CM | POA: Diagnosis present

## 2021-08-14 DIAGNOSIS — D631 Anemia in chronic kidney disease: Secondary | ICD-10-CM | POA: Insufficient documentation

## 2021-08-20 ENCOUNTER — Encounter: Payer: Self-pay | Admitting: Family Medicine

## 2021-08-22 ENCOUNTER — Institutional Professional Consult (permissible substitution): Payer: Managed Care, Other (non HMO) | Admitting: Neurology

## 2021-08-26 ENCOUNTER — Telehealth: Payer: Self-pay | Admitting: Family Medicine

## 2021-08-26 ENCOUNTER — Encounter: Payer: 59 | Attending: Family Medicine | Admitting: Dietician

## 2021-08-26 ENCOUNTER — Other Ambulatory Visit: Payer: Self-pay | Admitting: Family Medicine

## 2021-08-26 ENCOUNTER — Encounter: Payer: Self-pay | Admitting: Dietician

## 2021-08-26 ENCOUNTER — Other Ambulatory Visit: Payer: Self-pay

## 2021-08-26 VITALS — Ht 62.0 in | Wt 344.0 lb

## 2021-08-26 DIAGNOSIS — E1122 Type 2 diabetes mellitus with diabetic chronic kidney disease: Secondary | ICD-10-CM | POA: Diagnosis present

## 2021-08-26 DIAGNOSIS — N189 Chronic kidney disease, unspecified: Secondary | ICD-10-CM | POA: Diagnosis not present

## 2021-08-26 DIAGNOSIS — N184 Chronic kidney disease, stage 4 (severe): Secondary | ICD-10-CM

## 2021-08-26 MED ORDER — MELOXICAM 15 MG PO TABS: 15.0000 mg | ORAL_TABLET | Freq: Every day | ORAL | 0 refills | Status: DC

## 2021-08-26 NOTE — Telephone Encounter (Signed)
[facility] faxed refill request for the following medications:  meloxicam (MOBIC) 15 MG tablet   Please advise.  

## 2021-08-26 NOTE — Patient Instructions (Addendum)
Keep portions of protein foods to the size of the palm of the hand or smaller.  Have some balanced meal options from grocery store rather than restaurant delivery.  Check sodium content of meals, ideally keep to 600mg  or less. Work to have a small meal or healthy snack every 4-5 hours while awake.

## 2021-08-26 NOTE — Progress Notes (Signed)
Medical Nutrition Therapy: Visit start time: 1330  end time: 1430  Assessment:  Diagnosis: CKD Stage 4 Past medical history: pre-diabetes, HTN, HLD, sleep apnea, Sciatic pain Psychosocial issues/ stress concerns: bipolar disorder  Preferred learning method:  Visual   Current weight: 344lbs Height: 5'2" BMI: 63 Medications, supplements: reconciled list in medical record  Progress and evaluation:  Recent labs indicate HbA1C of 6.2%,  alkaline phos 114, sodium 141, 4.9, calcium 9.4 , GFR 29 (06/05/21) Reports poor appetite, usually does not eat until evening. Usually orders out as unable to stand and cook.  Was eating pint of ice cream nightly after mother's death last year, but has now stopped.  Looking for new apartment, as her current apartment is on the 3rd floor and sciatic pain along with weight make stair climbing very difficult. Patient is not leaving her apartment much due to the stairs. Patient reports having supportive and caring family members, but she has little self-love, states she has not liked herself since childhood. Has had thoughts of self harm in the past but not recently. She is seeing a therapist regularly.  Physical activity: none due to pain; standing and moving are difficult.  Dietary Intake:  Usual eating pattern includes 1 meals and 1-2 snacks per day. Dining out frequency: 7 meals per week.  Breakfast: none Snack: none  Lunch: often none; loves salads; loves hot dogs but avoids them most of the time Snack: skinny pop popcorn; apple; occ grapes or pineapple; used to eat pecans Supper: burger, occ fries or chips usu salad with vinaigrette dressing; hcicken salad with grapes, crackers; chopped steak with gravy; salmon grilled; occ fried flounder; meat loaf Snack: none Beverages: water only  Nutrition Care Education: Topics covered:  Basic nutrition: basic food groups, appropriate nutrient balance, appropriate meal and snack schedule, general nutrition  guidelines    Weight control: importance of low sugar and low fat choices; appropriate food portions, eating at regular intervals CKD: avoiding large portions of protein foods and eating small portions at several intervals; limiting sodium, goals for sodium with each meal, avoiding increases in potassium and phosphorus intake and large portions of high potassium or high phosphorus foods.  Other: discussed options for convenient meals at home, and making restaurant meals into 2 or more meals, especially with meat portions  Nutritional Diagnosis:  [place]-2.1 Inpaired nutrition utilization and [place]-2.2 Altered nutrition-related laboratory As related to Stage 4 CKD, hyperglycemia.  As evidenced by elevated HbA1C, low GFR. [place]-3.3 Overweight/obesity As related to limited mobility, history of excess calories, stress.  As evidenced by patient with current BMI of 63. [place]-5.11.1 Predicted suboptimal nutrient intake As related to infrequent meals.  As evidenced by patient reporting 1 meal daily.  Intervention:  Instruction and discussion as noted above. Patient voices readiness to make changes. She reports some bad days emotionally, but does have supportive family.  Established goals for change with direction from patient.   Education Materials given:  Renal disease handout ([facility]) Museum/gallery conservator with food lists Visit summary with goals/ instructions   Learner/ who was taught:  Patient   Level of understanding: Verbalizes/ demonstrates competency   Demonstrated degree of understanding via:   Teach back Learning barriers: None  Willingness to learn/ readiness for change: Acceptance, ready for change   Monitoring and Evaluation:  Dietary intake, exercise, renal function, and body weight      follow up:  10/23/21 at 1:30pm

## 2021-09-23 ENCOUNTER — Encounter: Payer: 59 | Admitting: Family Medicine

## 2021-09-23 NOTE — Progress Notes (Deleted)
Complete physical exam   Patient: Ashley Mccullough   DOB: 07-Aug-1961   61 y.o. Female  MRN: [id number] Visit Date: 09/23/2021  Today's healthcare provider: Gwyneth Sprout, FNP   No chief complaint on file.  Subjective     HPI  Ashley Mccullough is a 61 y.o. female who presents today for a complete physical exam.  She reports consuming a {diet types:17450} diet. {Exercise:19826} She generally feels {well/fairly well/poorly:18703}. She reports sleeping {well/fairly well/poorly:18703}. She {does/does not:200015} have additional problems to discuss today.  Last Reported Colonoscopy-12/23/2019 Mammo-ordered 06/05/2021 DUE  Past Medical History:  Diagnosis Date   Allergy    Anxiety    Depression    Hyperlipidemia    Hypertension    Past Surgical History:  Procedure Laterality Date   ABDOMINAL HYSTERECTOMY     still has ovaries   COLONOSCOPY WITH PROPOFOL N/A 12/23/2019   Procedure: COLONOSCOPY WITH PROPOFOL;  Surgeon: Jonathon Bellows, MD;  Location: [facility] ENDOSCOPY;  Service: Gastroenterology;  Laterality: N/A;   Social History   Socioeconomic History   Marital status: Single    Spouse name: Not on file   Number of children: Not on file   Years of education: Not on file   Highest education level: Not on file  Occupational History   Not on file  Tobacco Use   Smoking status: Never   Smokeless tobacco: Never  Vaping Use   Vaping Use: Never used  Substance and Sexual Activity   Alcohol use: Not Currently   Drug use: No   Sexual activity: Not on file  Other Topics Concern   Not on file  Social History Narrative   Not on file   Social Determinants of Health   Financial Resource Strain: Not on file  Food Insecurity: Not on file  Transportation Needs: Not on file  Physical Activity: Not on file  Stress: Not on file  Social Connections: Not on file  Intimate Partner Violence: Not on file   Family Status  Relation Name Status   Mother  Alive       pre  diabetic   Father  Deceased at age 36       CAD   Sister  [place] 2 sisters Thomas maternal Alive   Family History  Problem Relation Age of Onset   Cancer Mother        breast   Breast cancer Mother    Depression Father    Heart disease Father    Alcohol abuse Father    Hypertension Sister    Diabetes Brother    Diabetes Brother    Breast cancer Maternal Aunt    Breast cancer Cousin    Allergies  Allergen Reactions   Cashew Nut Oil Hives, Itching and Swelling   Latex Itching   Norvasc [Amlodipine] Other (See Comments)    Dizziness, flushed   Simvastatin     Other reaction(s): Unknown Memory changes Memory changes   Sulfa Antibiotics     Other reaction(s): Unknown   Levofloxacin Rash   Prednisone Rash    Patient Care Team: Gwyneth Sprout, FNP as PCP - General (Family Medicine)   Medications: Outpatient Medications Prior to Visit  Medication Sig   acetaminophen (TYLENOL) 650 MG CR tablet Take 650-1,300 mg by mouth at bedtime. As needed for arthritis.   amLODipine (NORVASC) 5 MG tablet Take 1 tablet by  mouth daily.   buPROPion (WELLBUTRIN XL) 150 MG 24 hr tablet Take 1 tablet by mouth daily.   Cetirizine HCl 10 MG CAPS Take by mouth.   Cholecalciferol (VITAMIN D3) 5000 units TABS Take 5,000 tablets by mouth daily.   COLLAGEN PO Take by mouth.   FLUoxetine (PROZAC) 20 MG capsule Take by mouth.   gabapentin (NEURONTIN) 300 MG capsule Take 2 capsules by mouth 3 (three) times daily.   hydrOXYzine (ATARAX/VISTARIL) 25 MG tablet Take 1 tablet (25 mg total) by mouth every 4 (four) hours as needed for anxiety.   losartan-hydrochlorothiazide (HYZAAR) 100-25 MG tablet Take 1 tablet by mouth daily.   meloxicam (MOBIC) 15 MG tablet Take 1 tablet (15 mg total) by mouth daily.   metaxalone (SKELAXIN) 800 MG tablet Take 0.5-1 tablets (400-800 mg total) by mouth 3 (three) times daily as needed for muscle spasms.   metoprolol  succinate (TOPROL-XL) 25 MG 24 hr tablet TAKE 1 TABLET(25 MG) BY MOUTH DAILY   montelukast (SINGULAIR) 10 MG tablet Take 1 tablet by mouth daily.   traMADol (ULTRAM) 50 MG tablet 1/2-1 po bid prn   traZODone (DESYREL) 100 MG tablet Take 1-2 tablets at night prior to sleep   No facility-administered medications prior to visit.    Review of Systems  {Labs   Heme   Chem   Endocrine   Serology   Results Review (optional):23779}  Objective    There were no vitals taken for this visit. {Show previous vital signs (optional):23777}  Physical Exam  ***  Last depression screening scores PHQ 2/9 Scores 08/26/2021 06/05/2021 02/12/2021  PHQ - 2 Score 6 6 6   PHQ- 9 Score 25 21 22   Exception Documentation - - -  Not completed - - -   Last fall risk screening Fall Risk  08/26/2021  Falls in the past year? 1  Number falls in past yr: 0  Injury with Fall? 1  Comment affected ability to leave her apartment on 3rd floor, no elevator  Risk for fall due to : History of fall(s)   Last Audit-C alcohol use screening Alcohol Use Disorder Test (AUDIT) 06/05/2021  1. How often do you have a drink containing alcohol? 0  2. How many drinks containing alcohol do you have on a typical day when you are drinking? 0  3. How often do you have six or more drinks on one occasion? 0  AUDIT-C Score 0  Alcohol Brief Interventions/Follow-up -   A score of 3 or more in women, and 4 or more in men indicates increased risk for alcohol abuse, EXCEPT if all of the points are from question 1   No results found for any visits on 09/23/21.  Assessment & Plan    Routine Health Maintenance and Physical Exam  Exercise Activities and Dietary recommendations  Goals   None     Immunization History  Administered Date(s) Administered   Influenza Split 08/11/2006   Influenza,inj,Quad PF,6+ Mos 07/01/2017, 07/02/2018, 07/06/2019, 06/15/2020, 06/05/2021   PFIZER(Purple Top)SARS-COV-2 Vaccination 12/01/2019, 12/28/2019    Tdap 12/26/2009    Health Maintenance  Topic Date Due   Zoster Vaccines- Shingrix (1 of 2) Never done   MAMMOGRAM  01/03/2018   TETANUS/TDAP  12/27/2019   COVID-19 Vaccine (3 - Booster for Pfizer series) 02/22/2020   COLONOSCOPY (Pts 45-64yrs Insurance coverage will need to be confirmed)  12/22/2029   INFLUENZA VACCINE  Completed   Hepatitis C Screening  Completed   Pneumococcal Vaccine 23-61 Years old  Aged Out   HPV VACCINES  Aged Out   PAP SMEAR-Modifier  Discontinued   HIV Screening  Discontinued    Discussed health benefits of physical activity, and encouraged her to engage in regular exercise appropriate for her age and condition.  ***  No follow-ups on file.     {provider attestation***:1}   Gwyneth Sprout, [place] [phone] (phone) [phone] (fax)  [place]

## 2021-09-26 DIAGNOSIS — R69 Illness, unspecified: Secondary | ICD-10-CM | POA: Diagnosis not present

## 2021-09-26 DIAGNOSIS — R8290 Unspecified abnormal findings in urine: Secondary | ICD-10-CM | POA: Diagnosis not present

## 2021-09-26 DIAGNOSIS — G4733 Obstructive sleep apnea (adult) (pediatric): Secondary | ICD-10-CM | POA: Diagnosis not present

## 2021-09-26 DIAGNOSIS — I10 Essential (primary) hypertension: Secondary | ICD-10-CM | POA: Diagnosis not present

## 2021-09-26 DIAGNOSIS — D631 Anemia in chronic kidney disease: Secondary | ICD-10-CM | POA: Diagnosis not present

## 2021-09-26 DIAGNOSIS — E668 Other obesity: Secondary | ICD-10-CM | POA: Diagnosis not present

## 2021-09-26 DIAGNOSIS — E785 Hyperlipidemia, unspecified: Secondary | ICD-10-CM | POA: Diagnosis not present

## 2021-09-26 DIAGNOSIS — N184 Chronic kidney disease, stage 4 (severe): Secondary | ICD-10-CM | POA: Diagnosis not present

## 2021-10-17 ENCOUNTER — Ambulatory Visit (INDEPENDENT_AMBULATORY_CARE_PROVIDER_SITE_OTHER): Payer: 59 | Admitting: Family Medicine

## 2021-10-17 ENCOUNTER — Other Ambulatory Visit: Payer: Self-pay

## 2021-10-17 VITALS — BP 152/89 | HR 68 | Temp 98.5°F | Resp 16 | Ht 61.0 in | Wt 345.5 lb

## 2021-10-17 DIAGNOSIS — Z0000 Encounter for general adult medical examination without abnormal findings: Secondary | ICD-10-CM | POA: Diagnosis not present

## 2021-10-17 DIAGNOSIS — R7303 Prediabetes: Secondary | ICD-10-CM | POA: Diagnosis not present

## 2021-10-17 DIAGNOSIS — R69 Illness, unspecified: Secondary | ICD-10-CM | POA: Diagnosis not present

## 2021-10-17 DIAGNOSIS — F322 Major depressive disorder, single episode, severe without psychotic features: Secondary | ICD-10-CM

## 2021-10-17 DIAGNOSIS — I10 Essential (primary) hypertension: Secondary | ICD-10-CM

## 2021-10-17 DIAGNOSIS — N184 Chronic kidney disease, stage 4 (severe): Secondary | ICD-10-CM

## 2021-10-17 DIAGNOSIS — Z1231 Encounter for screening mammogram for malignant neoplasm of breast: Secondary | ICD-10-CM

## 2021-10-17 DIAGNOSIS — Z6841 Body Mass Index (BMI) 40.0 and over, adult: Secondary | ICD-10-CM | POA: Diagnosis not present

## 2021-10-17 DIAGNOSIS — E7800 Pure hypercholesterolemia, unspecified: Secondary | ICD-10-CM | POA: Diagnosis not present

## 2021-10-17 NOTE — Assessment & Plan Note (Signed)
Repeat a1c given weight gain

## 2021-10-17 NOTE — Assessment & Plan Note (Signed)
Chronic, elevated Denies CP Denies SOB Denies DOE No LE Edema noted on exam Continue medication Refills stable Seek emergent care if you develop CP, chest pain or chest pressure

## 2021-10-17 NOTE — Assessment & Plan Note (Signed)
grieving process per pt report d/t loss of mom >1 year ago

## 2021-10-17 NOTE — Assessment & Plan Note (Signed)
BMI 65 Referral placed to weight loss center for consult

## 2021-10-17 NOTE — Assessment & Plan Note (Signed)
F/b nephro Pt discouraged with conflicted information from dietician and nephrologist

## 2021-10-17 NOTE — Assessment & Plan Note (Signed)
Due for dental  Due for eye exam Pt encouraged to call to make both Accompanied by her aunt today Both concerned over pt's worsening depression Things to do to keep yourself healthy  - Exercise at least 30-45 minutes a day, 3-4 days a week.  - Eat a low-fat diet with lots of fruits and vegetables, up to 7-9 servings per day.  - Seatbelts can save your life. Wear them always.  - Smoke detectors on every level of your home, check batteries every year.  - Eye Doctor - have an eye exam every 1-2 years  - Safe sex - if you may be exposed to STDs, use a condom.  - Alcohol -  If you drink, do it moderately, less than 2 drinks per day.  - [place]. Choose someone to speak for you if you are not able.  - Depression is common in our stressful world.If you're feeling down or losing interest in things you normally enjoy, please come in for a visit.  - Violence - If anyone is threatening or hurting you, please call immediately.

## 2021-10-17 NOTE — Assessment & Plan Note (Signed)
Repeat labs today encouraged plant based diet and less processed meals Discussed air fryer to assist given limited mobility

## 2021-10-17 NOTE — Assessment & Plan Note (Signed)
Pt encouraged to go

## 2021-10-17 NOTE — Progress Notes (Signed)
Complete physical exam   Patient: Ashley Mccullough   DOB: 07/07/1961   61 y.o. Female  MRN: [id number] Visit Date: 10/17/2021  Today's healthcare provider: Gwyneth Sprout, FNP   Chief Complaint  Patient presents with   Annual Exam   Subjective     HPI  Ashley Mccullough is a 61 y.o. female who presents today for a complete physical exam.  She reports consuming a poor diet, patient reports one meal a day and a piece of fruit in PM.  She generally feels poorly. She reports sleeping poorly, The patient does not participate in regular exercise at present.Patient reports difficulty sleeping. Patient reports at times feeling anxious or tense before falling asleep, on average she reports that she sleeps 4hrs a night waking up more than 2x a night.Patient reports that she has had sleep study ordered but did not go because she states that she would be unable to fall asleep, patient reports previously being prescribed Trazodone for sleep but states that she felt drowsy next morning on medication so she d/c She does have additional problems to discuss today.  Last reported Mammogram- Ordered 06/05/21 OVERDUE Coonoscopy-12/23/19 Past Medical History:  Diagnosis Date   Allergy    Anxiety    Depression    Hyperlipidemia    Hypertension    Past Surgical History:  Procedure Laterality Date   ABDOMINAL HYSTERECTOMY     still has ovaries   COLONOSCOPY WITH PROPOFOL N/A 12/23/2019   Procedure: COLONOSCOPY WITH PROPOFOL;  Surgeon: Jonathon Bellows, MD;  Location: [facility];  Service: Gastroenterology;  Laterality: N/A;   Social History   Socioeconomic History   Marital status: Single    Spouse name: Not on file   Number of children: Not on file   Years of education: Not on file   Highest education level: Not on file  Occupational History   Not on file  Tobacco Use   Smoking status: Never   Smokeless tobacco: Never  Vaping Use   Vaping Use: Never used  Substance and Sexual  Activity   Alcohol use: Not Currently   Drug use: No   Sexual activity: Not on file  Other Topics Concern   Not on file  Social History Narrative   Not on file   Social Determinants of Health   Financial Resource Strain: Not on file  Food Insecurity: Not on file  Transportation Needs: Not on file  Physical Activity: Not on file  Stress: Not on file  Social Connections: Not on file  Intimate Partner Violence: Not on file   Family Status  Relation Name Status   Mother  Alive       pre diabetic   Father  Deceased at age 24       CAD   Sister  32   Brother  [place] 2 sisters [place] maternal 81   Family History  Problem Relation Age of Onset   Cancer Mother        breast   Breast cancer Mother    Depression Father    Heart disease Father    Alcohol abuse Father    Hypertension Sister    Diabetes Brother    Diabetes Brother    Breast cancer Maternal Aunt    Breast cancer Cousin    Allergies  Allergen Reactions   Cashew Nut Oil Hives, Itching and Swelling   Latex Itching  Norvasc [Amlodipine] Other (See Comments)    Dizziness, flushed   Simvastatin     Other reaction(s): Unknown Memory changes Memory changes   Sulfa Antibiotics     Other reaction(s): Unknown   Levofloxacin Rash   Prednisone Rash    Patient Care Team: Gwyneth Sprout, FNP as PCP - General (Family Medicine)   Medications: Outpatient Medications Prior to Visit  Medication Sig   acetaminophen (TYLENOL) 650 MG CR tablet Take 650-1,300 mg by mouth at bedtime. As needed for arthritis.   amLODipine (NORVASC) 5 MG tablet Take 1 tablet by mouth daily.   buPROPion (WELLBUTRIN XL) 150 MG 24 hr tablet Take 1 tablet by mouth daily.   Cetirizine HCl 10 MG CAPS Take by mouth.   Cholecalciferol (VITAMIN D3) 5000 units TABS Take 5,000 tablets by mouth daily.   COLLAGEN PO Take by mouth.   FLUoxetine (PROZAC) 20 MG capsule Take by mouth.   gabapentin (NEURONTIN)  300 MG capsule Take 2 capsules by mouth 3 (three) times daily.   losartan-hydrochlorothiazide (HYZAAR) 100-25 MG tablet Take 1 tablet by mouth daily.   metaxalone (SKELAXIN) 800 MG tablet Take 0.5-1 tablets (400-800 mg total) by mouth 3 (three) times daily as needed for muscle spasms.   metoprolol succinate (TOPROL-XL) 25 MG 24 hr tablet TAKE 1 TABLET(25 MG) BY MOUTH DAILY   montelukast (SINGULAIR) 10 MG tablet Take 1 tablet by mouth daily.   hydrOXYzine (ATARAX/VISTARIL) 25 MG tablet Take 1 tablet (25 mg total) by mouth every 4 (four) hours as needed for anxiety. (Patient not taking: Reported on 10/17/2021)   meloxicam (MOBIC) 15 MG tablet Take 1 tablet (15 mg total) by mouth daily. (Patient not taking: Reported on 10/17/2021)   traMADol (ULTRAM) 50 MG tablet 1/2-1 po bid prn (Patient not taking: Reported on 10/17/2021)   traZODone (DESYREL) 100 MG tablet Take 1-2 tablets at night prior to sleep (Patient not taking: Reported on 10/17/2021)   No facility-administered medications prior to visit.    Review of Systems  Constitutional:  Positive for fatigue.  Respiratory:  Positive for shortness of breath.   Genitourinary:  Positive for urgency.  Musculoskeletal:  Positive for arthralgias.  Allergic/Immunologic: Positive for environmental allergies and food allergies.  Psychiatric/Behavioral:  Positive for sleep disturbance. The patient is nervous/anxious.   All other systems reviewed and are negative.    Objective    BP (!) 152/89    Pulse 68    Temp 98.5 F (36.9 C) (Oral)    Resp 16    Ht 5\' 1"  (1.549 m)    Wt (!) 345 lb 8 oz (156.7 kg)    SpO2 96%    BMI 65.28 kg/m    Physical Exam Vitals and nursing note reviewed.  Constitutional:      General: She is awake. She is not in acute distress.    Appearance: Normal appearance. She is well-developed and well-groomed. She is obese. She is not ill-appearing, toxic-appearing or diaphoretic.  HENT:     Head: Normocephalic and atraumatic.     Jaw:  There is normal jaw occlusion. No trismus, tenderness, swelling or pain on movement.     Right Ear: Hearing, tympanic membrane, ear canal and external ear normal. There is no impacted cerumen.     Left Ear: Hearing, tympanic membrane, ear canal and external ear normal. There is no impacted cerumen.     Nose: Nose normal. No congestion or rhinorrhea.     Right Turbinates: Not enlarged, swollen or pale.  Left Turbinates: Not enlarged, swollen or pale.     Right Sinus: No maxillary sinus tenderness or frontal sinus tenderness.     Left Sinus: No maxillary sinus tenderness or frontal sinus tenderness.     Mouth/Throat:     Lips: Pink.     Mouth: Mucous membranes are moist. No injury.     Tongue: No lesions.     Pharynx: Oropharynx is clear. Uvula midline. No pharyngeal swelling, oropharyngeal exudate, posterior oropharyngeal erythema or uvula swelling.     Tonsils: No tonsillar exudate or tonsillar abscesses.  Eyes:     General: Lids are normal. Lids are everted, no foreign bodies appreciated. Vision grossly intact. Gaze aligned appropriately. No allergic shiner or visual field deficit.       Right eye: No discharge.        Left eye: No discharge.     Extraocular Movements: Extraocular movements intact.     Conjunctiva/sclera: Conjunctivae normal.     Right eye: Right conjunctiva is not injected. No exudate.    Left eye: Left conjunctiva is not injected. No exudate.    Pupils: Pupils are equal, round, and reactive to light.  Neck:     Thyroid: No thyroid mass, thyromegaly or thyroid tenderness.     Vascular: No carotid bruit.     Trachea: Trachea normal.  Cardiovascular:     Rate and Rhythm: Normal rate and regular rhythm.     Pulses: Normal pulses.          Carotid pulses are 2+ on the right side and 2+ on the left side.      Radial pulses are 2+ on the right side and 2+ on the left side.       Dorsalis pedis pulses are 2+ on the right side and 2+ on the left side.       Posterior  tibial pulses are 2+ on the right side and 2+ on the left side.     Heart sounds: Normal heart sounds, S1 normal and S2 normal. No murmur heard.   No friction rub. No gallop.  Pulmonary:     Effort: Pulmonary effort is normal. No respiratory distress.     Breath sounds: Normal breath sounds and air entry. No stridor. No wheezing, rhonchi or rales.  Chest:     Chest wall: No tenderness.     Comments: Breast exam deferred; discussed self exam Abdominal:     General: Abdomen is flat. Bowel sounds are normal. There is no distension.     Palpations: Abdomen is soft. There is no mass.     Tenderness: There is no abdominal tenderness. There is no right CVA tenderness, left CVA tenderness, guarding or rebound.     Hernia: No hernia is present.  Genitourinary:    Comments: Exam deferred; denies complaints Musculoskeletal:        General: No swelling, tenderness, deformity or signs of injury. Normal range of motion.     Cervical back: Full passive range of motion without pain, normal range of motion and neck supple. No edema, rigidity or tenderness. No muscular tenderness.     Right lower leg: No edema.     Left lower leg: No edema.  Lymphadenopathy:     Cervical: No cervical adenopathy.     Right cervical: No superficial, deep or posterior cervical adenopathy.    Left cervical: No superficial, deep or posterior cervical adenopathy.  Skin:    General: Skin is warm and dry.  Capillary Refill: Capillary refill takes less than 2 seconds.     Coloration: Skin is not jaundiced or pale.     Findings: No bruising, erythema, lesion or rash.  Neurological:     General: No focal deficit present.     Mental Status: She is alert and oriented to person, place, and time. Mental status is at baseline.     GCS: GCS eye subscore is 4. GCS verbal subscore is 5. GCS motor subscore is 6.     Sensory: Sensation is intact. No sensory deficit.     Motor: Motor function is intact. No weakness.     Coordination:  Coordination is intact. Coordination normal.     Gait: Gait is intact. Gait normal.  Psychiatric:        Attention and Perception: Attention and perception normal.        Mood and Affect: Mood and affect normal.        Speech: Speech normal.        Behavior: Behavior normal. Behavior is cooperative.        Thought Content: Thought content normal.        Cognition and Memory: Cognition and memory normal.        Judgment: Judgment normal.     Last depression screening scores PHQ 2/9 Scores 10/17/2021 08/26/2021 06/05/2021  PHQ - 2 Score 4 6 6   PHQ- 9 Score 17 25 21   Exception Documentation - - -  Not completed - - -   Last fall risk screening Fall Risk  10/17/2021  Falls in the past year? 1  Number falls in past yr: 0  Injury with Fall? 0  Comment -  Risk for fall due to : Impaired mobility   Last Audit-C alcohol use screening Alcohol Use Disorder Test (AUDIT) 10/17/2021  1. How often do you have a drink containing alcohol? 0  2. How many drinks containing alcohol do you have on a typical day when you are drinking? 0  3. How often do you have six or more drinks on one occasion? 0  AUDIT-C Score 0  Alcohol Brief Interventions/Follow-up -   A score of 3 or more in women, and 4 or more in men indicates increased risk for alcohol abuse, EXCEPT if all of the points are from question 1   No results found for any visits on 10/17/21.  Assessment & Plan    Routine Health Maintenance and Physical Exam  Exercise Activities and Dietary recommendations  Goals   None     Immunization History  Administered Date(s) Administered   Influenza Split 08/11/2006   Influenza,inj,Quad PF,6+ Mos 07/01/2017, 07/02/2018, 07/06/2019, 06/15/2020, 06/05/2021   PFIZER(Purple Top)SARS-COV-2 Vaccination 12/01/2019, 12/28/2019   Tdap 12/26/2009    Health Maintenance  Topic Date Due   Zoster Vaccines- Shingrix (1 of 2) Never done   MAMMOGRAM  01/03/2018   TETANUS/TDAP  12/27/2019   COVID-19 Vaccine  (3 - Booster for Pfizer series) 02/22/2020   COLONOSCOPY (Pts 45-46yrs Insurance coverage will need to be confirmed)  12/22/2029   INFLUENZA VACCINE  Completed   Hepatitis C Screening  Completed   HPV VACCINES  Aged Out   PAP SMEAR-Modifier  Discontinued   HIV Screening  Discontinued    Discussed health benefits of physical activity, and encouraged her to engage in regular exercise appropriate for her age and condition.  Problem List Items Addressed This Visit       Cardiovascular and Mediastinum   Primary hypertension  Chronic, elevated Denies CP Denies SOB Denies DOE No LE Edema noted on exam Continue medication Refills stable Seek emergent care if you develop CP, chest pain or chest pressure       Relevant Orders   Comprehensive metabolic panel     Genitourinary   Chronic kidney disease (CKD) stage G4/A1, severely decreased glomerular filtration rate (GFR) between 15-29 mL/min/1.73 square meter and albuminuria creatinine ratio less than 30 mg/g (HCC)    F/b nephro Pt discouraged with conflicted information from dietician and nephrologist         Other   Hypercholesterolemia without hypertriglyceridemia    Repeat labs today encouraged plant based diet and less processed meals Discussed air fryer to assist given limited mobility       Relevant Orders   Lipid panel   Prediabetes    Repeat a1c given weight gain      Relevant Orders   Hemoglobin A1c   BMI 60.0-69.9, adult ([place])    BMI 65 Referral placed to weight loss center for consult       Relevant Orders   Amb Referral to Bariatric Surgery   Encounter for screening mammogram for malignant neoplasm of breast    Pt encouraged to go      Relevant Orders   MM 3D SCREEN BREAST BILATERAL   Annual physical exam - Primary    Due for dental  Due for eye exam Pt encouraged to call to make both Accompanied by her aunt today Both concerned over pt's worsening depression Things to do to keep yourself  healthy  - Exercise at least 30-45 minutes a day, 3-4 days a week.  - Eat a low-fat diet with lots of fruits and vegetables, up to 7-9 servings per day.  - Seatbelts can save your life. Wear them always.  - Smoke detectors on every level of your home, check batteries every year.  - Eye Doctor - have an eye exam every 1-2 years  - Safe sex - if you may be exposed to STDs, use a condom.  - Alcohol -  If you drink, do it moderately, less than 2 drinks per day.  - [place]. Choose someone to speak for you if you are not able.  - Depression is common in our stressful world.If you're feeling down or losing interest in things you normally enjoy, please come in for a visit.  - Violence - If anyone is threatening or hurting you, please call immediately.        Depression, major, single episode, severe ([place])    grieving process per pt report d/t loss of mom >1 year ago      Relevant Orders   AMB Referral to [place]     Return in about 3 months (around 01/14/2022).     Vonna Kotyk, FNP, have reviewed all documentation for this visit. The documentation on 10/17/21 for the exam, diagnosis, procedures, and orders are all accurate and complete.  Patient seen and examined by Tally Joe,  FNP note scribed by Jennings [employer], [place], [place] [phone] (phone) [phone] (fax)  [place]

## 2021-10-18 ENCOUNTER — Telehealth: Payer: Self-pay | Admitting: *Deleted

## 2021-10-18 LAB — COMPREHENSIVE METABOLIC PANEL
ALT: 12 IU/L (ref 0–32)
AST: 12 IU/L (ref 0–40)
Albumin/Globulin Ratio: 1.3 (ref 1.2–2.2)
Albumin: 4.2 g/dL (ref 3.8–4.9)
Alkaline Phosphatase: 95 IU/L (ref 44–121)
BUN/Creatinine Ratio: 16 (ref 12–28)
BUN: 31 mg/dL — ABNORMAL HIGH (ref 8–27)
Bilirubin Total: 0.3 mg/dL (ref 0.0–1.2)
CO2: 23 mmol/L (ref 20–29)
Calcium: 9.6 mg/dL (ref 8.7–10.3)
Chloride: 104 mmol/L (ref 96–106)
Creatinine, Ser: 1.93 mg/dL — ABNORMAL HIGH (ref 0.57–1.00)
Globulin, Total: 3.3 g/dL (ref 1.5–4.5)
Glucose: 95 mg/dL (ref 70–99)
Potassium: 5 mmol/L (ref 3.5–5.2)
Sodium: 141 mmol/L (ref 134–144)
Total Protein: 7.5 g/dL (ref 6.0–8.5)
eGFR: 29 mL/min/{1.73_m2} — ABNORMAL LOW (ref >59)

## 2021-10-18 LAB — HEMOGLOBIN A1C
Est. average glucose Bld gHb Est-mCnc: 131 mg/dL
Hgb A1c MFr Bld: 6.2 % — ABNORMAL HIGH (ref 4.8–5.6)

## 2021-10-18 LAB — LIPID PANEL
Chol/HDL Ratio: 4 ratio (ref 0.0–4.4)
Cholesterol, Total: 257 mg/dL — ABNORMAL HIGH (ref 100–199)
HDL: 65 mg/dL (ref >39)
LDL Chol Calc (NIH): 177 mg/dL — ABNORMAL HIGH (ref 0–99)
Triglycerides: 90 mg/dL (ref 0–149)
VLDL Cholesterol Cal: 15 mg/dL (ref 5–40)

## 2021-10-18 NOTE — Chronic Care Management (AMB) (Signed)
°  Care Management   Note  10/18/2021 Name: Ashley Mccullough MRN: [id number] DOB: 1961/09/05  Ashley Mccullough is a 61 y.o. year old female who is a primary care patient of Gwyneth Sprout, FNP. I reached out to [name] by phone today in response to a referral sent by Ashley Mccullough's primary care provider.   Ashley Mccullough was given information about care management services today including:  Care management services include personalized support from designated clinical staff supervised by her physician, including individualized plan of care and coordination with other care providers 24/7 contact phone numbers for assistance for urgent and routine care needs. The patient may stop care management services at any time by phone call to the office staff.  Patient agreed to services and verbal consent obtained.   Follow up plan: Telephone appointment with care management team member scheduled for: 10/21/2021 and 11/05/2021  Julian Hy, [place] [facility]  Direct Dial: [phone]

## 2021-10-21 ENCOUNTER — Ambulatory Visit: Payer: 59

## 2021-10-21 DIAGNOSIS — F322 Major depressive disorder, single episode, severe without psychotic features: Secondary | ICD-10-CM

## 2021-10-21 DIAGNOSIS — N184 Chronic kidney disease, stage 4 (severe): Secondary | ICD-10-CM

## 2021-10-21 DIAGNOSIS — I10 Essential (primary) hypertension: Secondary | ICD-10-CM

## 2021-10-21 DIAGNOSIS — E7800 Pure hypercholesterolemia, unspecified: Secondary | ICD-10-CM

## 2021-10-21 NOTE — Patient Instructions (Addendum)
Visit Information  Thank you for allowing the Chronic Care Management team to participate in your care. It was great speaking with you! Please don't hesitate to contact me if you require assistance before our next scheduled telephone appointment.  Our next appointment is on November 15, 2021 at noon. Please call the care guide team at [phone] if you need to cancel or reschedule your appointment.    If you are experiencing a Mental Health or [place] or need someone to talk to, please contact any of the following: -Suicide and Crisis Lifeline: 988 -[place] National Suicide Prevention Lifeline: 530-102-2046 or TTY: 847-433-7209 TTY 214 482 4846) to talk to a trained counselor -1-800-273-TALK (toll free, 24 hour hotline) -Call 911   Following is a copy of your full care plan: Patient Care Plan: RN Care Management Plan of Care     Problem Identified: HTN, HLD, CKD, Anxiety and Depression      Long-Range Goal: Disease Progression Prevented or Minimized   Start Date: 10/21/2021  Expected End Date: 01/19/2022  Priority: High  Note:   Current Barriers:  Chronic Disease Management support and education needs related to HTN, HLD, Anxiety, and Depression .  RNCM Clinical Goal(s):  Patient will demonstrate improved adherence to prescribed treatment plan for HTN, HLD, Anxiety, and Depression through collaboration with the provider, RN Care Manager and the care team.   Interventions: 1:1 collaboration with primary care provider regarding development and update of comprehensive plan of care as evidenced by provider attestation and co-signature Inter-disciplinary care team collaboration (see longitudinal plan of care) Evaluation of current treatment plan related to  self management and patient's adherence to plan as established by provider   Hyperlipidemia Interventions:  (Status:  New goal.) Long Term Goal Lab Results  Component Value Date   CHOL 257 (H) 10/17/2021   HDL 65  10/17/2021   LDLCALC 177 (H) 10/17/2021   TRIG 90 10/17/2021   CHOLHDL 4.0 10/17/2021    Reviewed provider established cholesterol goals Discussed importance of completing regular laboratory monitoring as prescribed Reviewed importance of limiting foods high in cholesterol Reviewed exercise goals. Mobility is limited due chronic shoulder and joint pain. Advised to walk and engage in low impact exercises as tolerated.  Wellness Interventions:   (Status: New goal.) Long Term Goal Reviewed current treatment plan r/t depression and anxiety. Reports adhering to current medication plan. Denies suicidal ideations. Reports significant improvement with sleep hygiene since taking hydroxyzine. Symptoms r/t depression and anxiety are chronic. Reports experiencing these symptoms since childhood. Recalls starting therapy/counseling at the age of 80. Feels that symptoms are manageable with the current medications. Agrees that she would benefit from additional counseling and Psychiatric services. Confirmed pending outreach with the CCM LCSW later this month. Expressed interest in establishing care with a new Psychiatrist in the [place] area. Agreeable to outreach with the team at [facility]. Will follow up to confirm referral approval.   Hypertension Interventions:  (Status:  New goal.) Long Term Goal Last practice recorded BP readings:  BP Readings from Last 3 Encounters:  10/17/21 (!) 152/89  06/05/21 (!) 158/93  04/29/21 (!) 175/88  Most recent eGFR/CrCl:  Lab Results  Component Value Date   EGFR 29 (L) 10/17/2021    No components found for: CRCL  Reviewed medications and indications for use. Advised to continue taking as prescribed and notify a clinic provider if unable to tolerate the regimen. Discussed recent change in insurance plan. Encouraged to update the care management team with concerns regarding medication  management or prescription cost. Provided information regarding established  blood pressure parameters along with indications for notifying a provider. Advised to monitor and record readings.  Discussed compliance with recommended cardiac prudent diet. Encouraged to read nutrition labels, monitor sodium intake, and avoid highly processed foods when possible. Discussed complications of uncontrolled blood pressure.  Reviewed s/sx of heart attack, stroke and worsening symptoms that require immediate medical attention.   Patient Goals/Self-Care Activities: Take all medications as prescribed Attend all scheduled provider appointments Call pharmacy for medication refills 3-7 days in advance of running out of medications Perform all self care activities independently  Call provider office for new concerns or questions    Follow Up Plan:   Will follow up next month.      Consent to CCM Services: Ashley Mccullough was given information about Chronic Care Management services including:  CCM service includes personalized support from designated clinical staff supervised by her physician, including individualized plan of care and coordination with other care providers 24/7 contact phone numbers for assistance for urgent and routine care needs. Service will only be billed when office clinical staff spend 20 minutes or more in a month to coordinate care. Only one practitioner may furnish and bill the service in a calendar month. The patient may stop CCM services at any time (effective at the end of the month) by phone call to the office staff. The patient will be responsible for cost sharing (co-pay) of up to 20% of the service fee (after annual deductible is met).  Patient agreed to services and verbal consent obtained.   The patient verbalized understanding of instructions, educational materials, and care plan provided today and declined offer to receive copy of patient instructions, educational materials, and care plan.   A member of the care management team will follow up  next month.   Ashley Mccullough [facility] Care Management [facility] [phone]

## 2021-10-21 NOTE — Chronic Care Management (AMB) (Addendum)
Care Management   RN Visit Note  10/21/2021 Name: Ashley Mccullough MRN: [id number] DOB: 03/11/1961  Subjective: Ashley Mccullough is a 61 y.o. year old female who is a primary care patient of Ashley Sprout, FNP. The care management team was consulted for assistance with disease management and care coordination needs.    Engaged with patient by telephone for initial visit in response to provider referral for case management and care coordination services.   Consent to Services:   Ashley Mccullough was given information about Care Management services including:  Care Management services includes personalized support from designated clinical staff supervised by her physician, including individualized plan of care and coordination with other care providers 24/7 contact phone numbers for assistance for urgent and routine care needs. The patient may stop case management services at any time by phone call to the office staff.  Patient agreed to services and consent obtained.   Assessment: Review of patient past medical history, allergies, medications, health status, including review of consultants reports, laboratory and other test data, was performed as part of comprehensive evaluation and provision of chronic care management services.   SDOH (Social Determinants of Health) assessments and interventions performed:  SDOH Interventions    Flowsheet Row Most Recent Value  SDOH Interventions   Food Insecurity Interventions Intervention Not Indicated  Transportation Interventions Intervention Not Indicated  Depression Interventions/Treatment  Medication, Currently on Treatment        Care Plan  Allergies  Allergen Reactions   Cashew Nut Oil Hives, Itching and Swelling   Latex Itching   Norvasc [Amlodipine] Other (See Comments)    Dizziness, flushed   Simvastatin     Other reaction(s): Unknown Memory changes Memory changes   Sulfa Antibiotics     Other reaction(s): Unknown    Levofloxacin Rash   Prednisone Rash    Outpatient Encounter Medications as of 10/21/2021  Medication Sig Note   acetaminophen (TYLENOL) 650 MG CR tablet Take 650-1,300 mg by mouth at bedtime. As needed for arthritis.    buPROPion (WELLBUTRIN XL) 150 MG 24 hr tablet Take 1 tablet by mouth daily.    Cetirizine HCl 10 MG CAPS Take by mouth. 01/19/2019: Pt takes 1 tablet daily.   COLLAGEN PO Take by mouth.    FLUoxetine (PROZAC) 20 MG capsule Take by mouth. 10/21/2021: Reports dose changed to 20 mg (three times a day) for total of 17m   gabapentin (NEURONTIN) 300 MG capsule Take 2 capsules by mouth 3 (three) times daily. 10/21/2021: Reports taking twice a day   hydrOXYzine (ATARAX/VISTARIL) 25 MG tablet Take 1 tablet (25 mg total) by mouth every 4 (four) hours as needed for anxiety.    losartan-hydrochlorothiazide (HYZAAR) 100-25 MG tablet Take 1 tablet by mouth daily.    metoprolol succinate (TOPROL-XL) 25 MG 24 hr tablet TAKE 1 TABLET(25 MG) BY MOUTH DAILY    montelukast (SINGULAIR) 10 MG tablet Take 1 tablet by mouth daily.    traMADol (ULTRAM) 50 MG tablet  10/21/2021: Reports taking very rarely if needed   traZODone (DESYREL) 100 MG tablet Take 1-2 tablets at night prior to sleep    amLODipine (NORVASC) 5 MG tablet Take 1 tablet by mouth daily. (Patient not taking: Reported on 10/21/2021)    Cholecalciferol (VITAMIN D3) 5000 units TABS Take 5,000 tablets by mouth daily.    meloxicam (MOBIC) 15 MG tablet Take 1 tablet (15 mg total) by mouth daily. (Patient not taking: Reported on 10/17/2021)    metaxalone (SKELAXIN) 800  MG tablet Take 0.5-1 tablets (400-800 mg total) by mouth 3 (three) times daily as needed for muscle spasms. (Patient not taking: Reported on 10/21/2021)    No facility-administered encounter medications on file as of 10/21/2021.    Patient Active Problem List   Diagnosis Date Noted   Annual physical exam 10/17/2021   Depression, major, single episode, severe ([place]) 10/17/2021   Chronic  kidney disease (CKD) stage G4/A1, severely decreased glomerular filtration rate (GFR) between 15-29 mL/min/1.73 square meter and albuminuria creatinine ratio less than 30 mg/g ([place]) 10/17/2021   Primary hypertension 10/17/2021   CKD (chronic kidney disease) stage 4, GFR 15-29 ml/min ([place]) 06/06/2021   Hypersomnia 06/05/2021   Lumbar radiculitis 06/05/2021   Encounter for screening mammogram for malignant neoplasm of breast 06/05/2021   Chronic pain syndrome 04/11/2021   Bilateral primary osteoarthritis of knee 04/11/2021   Lumbar spondylosis 04/11/2021   Sacroiliac joint pain 04/11/2021   Chronic pain of both knees 06/21/2020   Chronic radicular lumbar pain 11/18/2019   Spinal stenosis of lumbar region with neurogenic claudication 11/18/2019   DDD (degenerative disc disease), lumbosacral 11/18/2019   Mild episode of recurrent major depressive disorder ([place]) 10/26/2018   BMI 60.0-69.9, adult ([place]) 07/01/2017   Morbid obesity due to excess calories ([place]) 12/24/2015   Allergic rhinitis 05/29/2015   Anxiety, generalized 05/29/2015   D (diarrhea) 05/29/2015   Avitaminosis D 05/29/2015   Prediabetes 05/29/2015   Fatigue 05/29/2015   Hypertension 03/15/2015   Cannot sleep 11/13/2009   Congenital pes planus 03/13/2008   Hypercholesterolemia without hypertriglyceridemia 09/16/2006    Patient Care Plan: RN Care Management Plan of Care     Problem Identified: HTN, HLD, CKD, Anxiety and Depression      Long-Range Goal: Disease Progression Prevented or Minimized   Start Date: 10/21/2021  Expected End Date: 01/19/2022  Priority: High  Note:   Current Barriers:  Disease Management support and education needs related to HTN, HLD, Anxiety, and Depression .  RNCM Clinical Goal(s):  Patient will demonstrate improved adherence to prescribed treatment plan for HTN, HLD, Anxiety, and Depression through collaboration with the provider, RN Care Manager and the care team.   Interventions: 1:1  collaboration with primary care provider regarding development and update of comprehensive plan of care as evidenced by provider attestation and co-signature Inter-disciplinary care team collaboration (see longitudinal plan of care) Evaluation of current treatment plan related to  self management and patient's adherence to plan as established by provider   Hyperlipidemia Interventions:  (Status:  New goal.) Long Term Goal Lab Results  Component Value Date   CHOL 257 (H) 10/17/2021   HDL 65 10/17/2021   LDLCALC 177 (H) 10/17/2021   TRIG 90 10/17/2021   CHOLHDL 4.0 10/17/2021    Reviewed provider established cholesterol goals Discussed importance of completing regular laboratory monitoring as prescribed Reviewed importance of limiting foods high in cholesterol Reviewed exercise goals. Mobility is limited due chronic shoulder and joint pain. Advised to walk and engage in low impact exercises as tolerated.  Wellness Interventions:   (Status: New goal.) Long Term Goal Reviewed current treatment plan r/t depression and anxiety. Reports adhering to current medication plan. Denies suicidal ideations. Reports significant improvement with sleep hygiene since taking hydroxyzine. Symptoms r/t depression and anxiety are chronic. Reports experiencing these symptoms since childhood. Recalls starting therapy/counseling at the age of 9. Feels that symptoms are manageable with the current medications. Agrees that she would benefit from additional counseling and Psychiatric services. Confirmed pending outreach with  the CCM LCSW later this month. Expressed interest in establishing care with a new Psychiatrist in the [place] area. Agreeable to outreach with the team at [facility]. Will follow up to confirm referral approval.   Hypertension Interventions:  (Status:  New goal.) Long Term Goal Last practice recorded BP readings:  BP Readings from Last 3 Encounters:  10/17/21 (!) 152/89  06/05/21 (!)  158/93  04/29/21 (!) 175/88  Most recent eGFR/CrCl:  Lab Results  Component Value Date   EGFR 29 (L) 10/17/2021    No components found for: CRCL  Reviewed medications and indications for use. Advised to continue taking as prescribed and notify a clinic provider if unable to tolerate the regimen. Discussed recent change in insurance plan. Encouraged to update the care management team with concerns regarding medication management or prescription cost. Provided information regarding established blood pressure parameters along with indications for notifying a provider. Advised to monitor and record readings.  Discussed compliance with recommended cardiac prudent diet. Encouraged to read nutrition labels, monitor sodium intake, and avoid highly processed foods when possible. Discussed complications of uncontrolled blood pressure.  Reviewed s/sx of heart attack, stroke and worsening symptoms that require immediate medical attention.   Patient Goals/Self-Care Activities: Take all medications as prescribed Attend all scheduled provider appointments Call pharmacy for medication refills 3-7 days in advance of running out of medications Perform all self care activities independently  Call provider office for new concerns or questions    Follow Up Plan:   Will follow up next month.       PLAN A member of the care management team will follow up next month.   Cristy Friedlander [facility] Care Management [facility] [phone]

## 2021-10-23 ENCOUNTER — Ambulatory Visit: Payer: 59 | Admitting: Dietician

## 2021-11-01 ENCOUNTER — Other Ambulatory Visit: Payer: Self-pay | Admitting: Family Medicine

## 2021-11-01 ENCOUNTER — Ambulatory Visit: Payer: 59

## 2021-11-01 DIAGNOSIS — F322 Major depressive disorder, single episode, severe without psychotic features: Secondary | ICD-10-CM

## 2021-11-01 DIAGNOSIS — I10 Essential (primary) hypertension: Secondary | ICD-10-CM

## 2021-11-01 MED ORDER — METOPROLOL SUCCINATE ER 25 MG PO TB24: ORAL_TABLET | ORAL | 1 refills | Status: DC

## 2021-11-01 NOTE — Chronic Care Management (AMB) (Signed)
Care Management    RN Visit Note  11/01/2021 Name: Ashley Mccullough MRN: [id number] DOB: 04-Apr-1961  Subjective: Ashley Mccullough is a 61 y.o. year old female who is a primary care patient of Ashley Sprout, FNP. The care management team was consulted for assistance with disease management and care coordination needs.    Follow up care coordination was conducted in response to patient's request for assistance with establishing Psychiatry services.  Consent to Services:   Ashley Mccullough was given information about Care Management services including:  Care Management services includes personalized support from designated clinical staff supervised by her physician, including individualized plan of care and coordination with other care providers 24/7 contact phone numbers for assistance for urgent and routine care needs. The patient may stop case management services at any time by phone call to the office staff.  Patient agreed to services and consent obtained.   Assessment: Review of patient past medical history, allergies, medications, health status, including review of consultants reports, laboratory and other test data, was performed as part of comprehensive evaluation and provision of chronic care management services.   SDOH (Social Determinants of Health) assessments and interventions performed: No  CCM Care Plan  Allergies  Allergen Reactions   Cashew Nut Oil Hives, Itching and Swelling   Latex Itching   Norvasc [Amlodipine] Other (See Comments)    Dizziness, flushed   Simvastatin     Other reaction(s): Unknown Memory changes Memory changes   Sulfa Antibiotics     Other reaction(s): Unknown   Levofloxacin Rash   Prednisone Rash    Outpatient Encounter Medications as of 11/01/2021  Medication Sig Note   acetaminophen (TYLENOL) 650 MG CR tablet Take 650-1,300 mg by mouth at bedtime. As needed for arthritis.    amLODipine (NORVASC) 5 MG tablet Take 1 tablet by mouth  daily. (Patient not taking: Reported on 10/21/2021)    buPROPion (WELLBUTRIN XL) 150 MG 24 hr tablet Take 1 tablet by mouth daily.    Cetirizine HCl 10 MG CAPS Take by mouth. 01/19/2019: Pt takes 1 tablet daily.   Cholecalciferol (VITAMIN D3) 5000 units TABS Take 5,000 tablets by mouth daily.    COLLAGEN PO Take by mouth.    FLUoxetine (PROZAC) 20 MG capsule Take by mouth. 10/21/2021: Reports dose changed to 20 mg (three times a day) for total of 52m   gabapentin (NEURONTIN) 300 MG capsule Take 2 capsules by mouth 3 (three) times daily. 10/21/2021: Reports taking twice a day   hydrOXYzine (ATARAX/VISTARIL) 25 MG tablet Take 1 tablet (25 mg total) by mouth every 4 (four) hours as needed for anxiety.    losartan-hydrochlorothiazide (HYZAAR) 100-25 MG tablet Take 1 tablet by mouth daily.    meloxicam (MOBIC) 15 MG tablet Take 1 tablet (15 mg total) by mouth daily. (Patient not taking: Reported on 10/17/2021)    metaxalone (SKELAXIN) 800 MG tablet Take 0.5-1 tablets (400-800 mg total) by mouth 3 (three) times daily as needed for muscle spasms. (Patient not taking: Reported on 10/21/2021)    metoprolol succinate (TOPROL-XL) 25 MG 24 hr tablet TAKE 1 TABLET(25 MG) BY MOUTH DAILY    montelukast (SINGULAIR) 10 MG tablet Take 1 tablet by mouth daily.    traMADol (ULTRAM) 50 MG tablet  10/21/2021: Reports taking very rarely if needed   traZODone (DESYREL) 100 MG tablet Take 1-2 tablets at night prior to sleep    No facility-administered encounter medications on file as of 11/01/2021.    Patient Active  Problem List   Diagnosis Date Noted   Annual physical exam 10/17/2021   Depression, major, single episode, severe ([place]) 10/17/2021   Chronic kidney disease (CKD) stage G4/A1, severely decreased glomerular filtration rate (GFR) between 15-29 mL/min/1.73 square meter and albuminuria creatinine ratio less than 30 mg/g ([facility]) 10/17/2021   Primary hypertension 10/17/2021   CKD (chronic kidney disease) stage 4, GFR 15-29  ml/min ([place]) 06/06/2021   Hypersomnia 06/05/2021   Lumbar radiculitis 06/05/2021   Encounter for screening mammogram for malignant neoplasm of breast 06/05/2021   Chronic pain syndrome 04/11/2021   Bilateral primary osteoarthritis of knee 04/11/2021   Lumbar spondylosis 04/11/2021   Sacroiliac joint pain 04/11/2021   Chronic pain of both knees 06/21/2020   Chronic radicular lumbar pain 11/18/2019   Spinal stenosis of lumbar region with neurogenic claudication 11/18/2019   DDD (degenerative disc disease), lumbosacral 11/18/2019   Mild episode of recurrent major depressive disorder ([place]) 10/26/2018   BMI 60.0-69.9, adult ([place]) 07/01/2017   Morbid obesity due to excess calories ([place]) 12/24/2015   Allergic rhinitis 05/29/2015   Anxiety, generalized 05/29/2015   D (diarrhea) 05/29/2015   Avitaminosis D 05/29/2015   Prediabetes 05/29/2015   Fatigue 05/29/2015   Hypertension 03/15/2015   Cannot sleep 11/13/2009   Congenital pes planus 03/13/2008   Hypercholesterolemia without hypertriglyceridemia 09/16/2006     Patient Care Plan: RN Care Management Plan of Care     Problem Identified: HTN, HLD, CKD, Anxiety and Depression      Long-Range Goal: Disease Progression Prevented or Minimized   Start Date: 10/21/2021  Expected End Date: 01/19/2022  Priority: High  Note:   Current Barriers:  Disease Management support and education needs related to HTN, HLD, Anxiety, and Depression .  RNCM Clinical Goal(s):  Patient will demonstrate improved adherence to prescribed treatment plan for HTN, HLD, Anxiety, and Depression through collaboration with the provider, RN Care Manager and the care team.   Interventions: 1:1 collaboration with primary care provider regarding development and update of comprehensive plan of care as evidenced by provider attestation and co-signature Inter-disciplinary care team collaboration (see longitudinal plan of care) Evaluation of current treatment plan related to   self management and patient's adherence to plan as established by provider   Hyperlipidemia Interventions:  (Status:  New goal.) Long Term Goal Lab Results  Component Value Date   CHOL 257 (H) 10/17/2021   HDL 65 10/17/2021   LDLCALC 177 (H) 10/17/2021   TRIG 90 10/17/2021   CHOLHDL 4.0 10/17/2021    Reviewed provider established cholesterol goals Discussed importance of completing regular laboratory monitoring as prescribed Reviewed importance of limiting foods high in cholesterol Reviewed exercise goals. Mobility is limited due chronic shoulder and joint pain. Advised to walk and engage in low impact exercises as tolerated.     Hypertension Interventions:  (Status:  New goal.) Long Term Goal Last practice recorded BP readings:  BP Readings from Last 3 Encounters:  10/17/21 (!) 152/89  06/05/21 (!) 158/93  04/29/21 (!) 175/88  Most recent eGFR/CrCl:  Lab Results  Component Value Date   EGFR 29 (L) 10/17/2021    No components found for: CRCL  Reviewed medications and indications for use. Advised to continue taking as prescribed and notify a clinic provider if unable to tolerate the regimen. Discussed recent change in insurance plan. Encouraged to update the care management team with concerns regarding medication management or prescription cost. Provided information regarding established blood pressure parameters along with indications for notifying a provider. Advised  to monitor and record readings.  Discussed compliance with recommended cardiac prudent diet. Encouraged to read nutrition labels, monitor sodium intake, and avoid highly processed foods when possible. Discussed complications of uncontrolled blood pressure.  Reviewed s/sx of heart attack, stroke and worsening symptoms that require immediate medical attention.    Wellness Interventions:    Reviewed current treatment plan r/t depression and anxiety. Reports adhering to current medication plan. Denies suicidal  ideations. Reports significant improvement with sleep hygiene since taking hydroxyzine. Symptoms r/t depression and anxiety are chronic. Reports experiencing these symptoms since childhood. Recalls starting therapy/counseling at the age of 55. Feels that symptoms are manageable with the current medications. Agrees that she would benefit from additional counseling and Psychiatric services. Confirmed pending outreach with the CCM LCSW later this month. Expressed interest in establishing care with a new Psychiatrist in the [place] area. Agreeable to outreach with the team at [facility]. Will follow up to confirm referral approval. Update 11/01/21: Collaboration with intake staff at [facility] regarding request for Psychiatry services. Per staff, primary focus will be medication management and routine evaluation. Most sessions are currently being conducted virtually. Discussed insurance coverage and out of pocket expenses. During previous outreach, patient mentioned possible transition to a different insurance provider. Intake staff indicated patient will be charged $250 for the initial visit and $200 for subsequent visits if her insurance provider declines coverage. Will follow up with patient to confirm anticipated coverage.    Patient Goals/Self-Care Activities: Take all medications as prescribed Attend all scheduled provider appointments Call pharmacy for medication refills 3-7 days in advance of running out of medications Perform all self care activities independently  Call provider office for new concerns or questions    Follow Up Plan:   Will follow up within the next two weeks.    PLAN A member of the care management team will follow up within the next two weeks.   Cristy Friedlander [facility] Care Management [facility] [phone]

## 2021-11-01 NOTE — Telephone Encounter (Signed)
Copied from Long. Topic: Quick Communication - Rx Refill/Question >> Nov 01, 2021 11:30 AM Tessa Lerner A wrote: Medication: metoprolol succinate (TOPROL-XL) 25 MG 24 hr tablet [[id number]]   Has the patient contacted their pharmacy? Yes. The patient has been directed to contact their PCP (Agent: If no, request that the patient contact the pharmacy for the refill. If patient does not wish to contact the pharmacy document the reason why and proceed with request.) (Agent: If yes, when and what did the pharmacy advise?)  Preferred Pharmacy (with phone number or street name): [id number] [place] [address] [place] [address] Phone: [phone] Fax: [phone] Hours: Not open 24 hours  Has the patient been seen for an appointment in the last year OR does the patient have an upcoming appointment? Yes.    Agent: Please be advised that RX refills may take up to 3 business days. We ask that you follow-up with your pharmacy.

## 2021-11-05 ENCOUNTER — Ambulatory Visit: Payer: 59 | Admitting: *Deleted

## 2021-11-05 DIAGNOSIS — I10 Essential (primary) hypertension: Secondary | ICD-10-CM

## 2021-11-05 DIAGNOSIS — F322 Major depressive disorder, single episode, severe without psychotic features: Secondary | ICD-10-CM

## 2021-11-05 NOTE — Chronic Care Management (AMB) (Signed)
Care Management Clinical Social Work Note  11/05/2021 Name: Leara Rawl MRN: [id number] DOB: [birth year]  Edson Snowball Dutt is a 61 y.o. year old female who is a primary care patient of Gwyneth Sprout, FNP.  The Care Management team was consulted for assistance with chronic disease management and coordination needs.  Engaged with patient by telephone for initial visit in response to provider referral for social work chronic care management and care coordination services  Consent to Services:  Ms. Gotts was given information about Care Management services today including:  Care Management services includes personalized support from designated clinical staff supervised by her physician, including individualized plan of care and coordination with other care providers 24/7 contact phone numbers for assistance for urgent and routine care needs. The patient may stop case management services at any time by phone call to the office staff.  Patient agreed to services and consent obtained.   Assessment: Review of patient past medical history, allergies, medications, and health status, including review of relevant consultants reports was performed today as part of a comprehensive evaluation and provision of chronic care management and care coordination services.  SDOH (Social Determinants of Health) assessments and interventions performed:  SDOH Interventions    Flowsheet Row Most Recent Value  SDOH Interventions   SDOH Interventions for the Following Domains Depression  Depression Interventions/Treatment  Counseling        Advanced Directives Status: Not addressed in this encounter.  Care Plan  Allergies  Allergen Reactions   Cashew Nut Oil Hives, Itching and Swelling   Latex Itching   Norvasc [Amlodipine] Other (See Comments)    Dizziness, flushed   Simvastatin     Other reaction(s): Unknown Memory changes Memory changes   Sulfa Antibiotics     Other reaction(s): Unknown    Levofloxacin Rash   Prednisone Rash    Outpatient Encounter Medications as of 11/05/2021  Medication Sig Note   acetaminophen (TYLENOL) 650 MG CR tablet Take 650-1,300 mg by mouth at bedtime. As needed for arthritis.    amLODipine (NORVASC) 5 MG tablet Take 1 tablet by mouth daily. (Patient not taking: Reported on 10/21/2021)    buPROPion (WELLBUTRIN XL) 150 MG 24 hr tablet Take 1 tablet by mouth daily.    Cetirizine HCl 10 MG CAPS Take by mouth. 01/19/2019: Pt takes 1 tablet daily.   Cholecalciferol (VITAMIN D3) 5000 units TABS Take 5,000 tablets by mouth daily.    COLLAGEN PO Take by mouth.    FLUoxetine (PROZAC) 20 MG capsule Take by mouth. 10/21/2021: Reports dose changed to 20 mg (three times a day) for total of 60mg    gabapentin (NEURONTIN) 300 MG capsule Take 2 capsules by mouth 3 (three) times daily. 10/21/2021: Reports taking twice a day   hydrOXYzine (ATARAX/VISTARIL) 25 MG tablet Take 1 tablet (25 mg total) by mouth every 4 (four) hours as needed for anxiety.    losartan-hydrochlorothiazide (HYZAAR) 100-25 MG tablet Take 1 tablet by mouth daily.    meloxicam (MOBIC) 15 MG tablet Take 1 tablet (15 mg total) by mouth daily. (Patient not taking: Reported on 10/17/2021)    metaxalone (SKELAXIN) 800 MG tablet Take 0.5-1 tablets (400-800 mg total) by mouth 3 (three) times daily as needed for muscle spasms. (Patient not taking: Reported on 10/21/2021)    metoprolol succinate (TOPROL-XL) 25 MG 24 hr tablet TAKE 1 TABLET(25 MG) BY MOUTH DAILY    montelukast (SINGULAIR) 10 MG tablet Take 1 tablet by mouth daily.    traMADol Veatrice Bourbon)  50 MG tablet  10/21/2021: Reports taking very rarely if needed   traZODone (DESYREL) 100 MG tablet Take 1-2 tablets at night prior to sleep    No facility-administered encounter medications on file as of 11/05/2021.    Patient Active Problem List   Diagnosis Date Noted   Annual physical exam 10/17/2021   Depression, major, single episode, severe ([place]) 10/17/2021    Chronic kidney disease (CKD) stage G4/A1, severely decreased glomerular filtration rate (GFR) between 15-29 mL/min/1.73 square meter and albuminuria creatinine ratio less than 30 mg/g ([place]) 10/17/2021   Primary hypertension 10/17/2021   CKD (chronic kidney disease) stage 4, GFR 15-29 ml/min ([place]) 06/06/2021   Hypersomnia 06/05/2021   Lumbar radiculitis 06/05/2021   Encounter for screening mammogram for malignant neoplasm of breast 06/05/2021   Chronic pain syndrome 04/11/2021   Bilateral primary osteoarthritis of knee 04/11/2021   Lumbar spondylosis 04/11/2021   Sacroiliac joint pain 04/11/2021   Chronic pain of both knees 06/21/2020   Chronic radicular lumbar pain 11/18/2019   Spinal stenosis of lumbar region with neurogenic claudication 11/18/2019   DDD (degenerative disc disease), lumbosacral 11/18/2019   Mild episode of recurrent major depressive disorder ([place]) 10/26/2018   BMI 60.0-69.9, adult ([place]) 07/01/2017   Morbid obesity due to excess calories ([place]) 12/24/2015   Allergic rhinitis 05/29/2015   Anxiety, generalized 05/29/2015   D (diarrhea) 05/29/2015   Avitaminosis D 05/29/2015   Prediabetes 05/29/2015   Fatigue 05/29/2015   Hypertension 03/15/2015   Cannot sleep 11/13/2009   Congenital pes planus 03/13/2008   Hypercholesterolemia without hypertriglyceridemia 09/16/2006    Conditions to be addressed/monitored: Depression; Mental Health Concerns   Care Plan : RN Care Management Plan of Care  Updates made by Vern Claude, LCSW since 11/05/2021 12:00 AM     Problem: [facility] AMB "PATIENT-SPECIFIC PROBLEM"   Note:   CARE PLAN ENTRY (see longitudinal plan of care for additional care plan information)  Current Barriers:  Knowledge deficits related to accessing mental health provider in patient with Depression  Patient is experiencing symptoms of  depression which seem to be excarerbated by feelings overwhelm in regards to her family dynamics Patient needs Support,  Education, and Care Coordination in order to meet unmet mental health needs  Mental Health Concerns   Clinical Social Work Goal(s):  Over the next 90 days, patient will work with SW bi-weekly by telephone or in person to reduce or manage symptoms of depression until connected for ongoing counseling resources.  Patient discussed family conflicts and difficulty maintaining boundaries Patient also discussed difficulty dealing with the death of her mother Positive coping strategies discussed and reinforced-setting personal boundaries, reading, beading, music Discussed possibility of intensive outpatient program, group therapy, individual therapy Patient will implement clinical interventions discussed today to decreases symptoms of depression and increase knowledge and/or ability of: coping skills. Interventions:  Assessed patient's understanding, education, previous treatment and care coordination needs  Patient interviewed and appropriate assessments performed: PHQ 2 PHQ 9 Provided basic mental health support, education and interventions  Collaborated with appropriate clinical care team members regarding patient needs Discussed several options for long term counseling based on need and insurance.  Reviewed mental health medications with patient prescribed by PCP and discussed compliance  Other interventions include: Active listening / Reflection utilized  Emotional Support Provided  [place], Obie Dredge, MSW   [address], [place], [place], [place] [address] [phone]  Patient Self Care Activities & Deficits:  Patient is unable to independently navigate community resource  options without care coordination support Patient is able to implement clinical interventions discussed today and is motivated for treatment  Patient will select one of the agencies from the list provided and call to schedule an appointment  Performs ADL's independently Performs IADL's independently Ability for  insight Motivation for treatment Strong family or social support  Initial goal documentation       Follow Up Plan: SW will follow up with patient by phone over the next 14 business days   [place], [place] Worker  [facility] [phone]

## 2021-11-05 NOTE — Patient Instructions (Addendum)
Visit Information  Thank you for taking time to visit with me today. Please don't hesitate to contact me if I can be of assistance to you before our next scheduled telephone appointment.  Following are the goals we discussed today:  - begin personal counseling - call and visit an old friend - join a support group - practice relaxation or meditation daily - talk about feelings with a friend, family or spiritual advisor - practice positive thinking and self-talk  Our next appointment is by telephone on 11/19/21 at 1pm  Please call the care guide team at [phone] if you need to cancel or reschedule your appointment.   If you are experiencing a Mental Health or [place] or need someone to talk to, please call the Suicide and Crisis Lifeline: 988   Patient verbalizes understanding of instructions and care plan provided today and agrees to view in [place]. Active MyChart status confirmed with patient.    Telephone follow up appointment with care management team member scheduled for: 11/19/21   Elliot Gurney, [place] Worker  [facility] Care Management [phone]

## 2021-11-06 ENCOUNTER — Other Ambulatory Visit: Payer: Self-pay | Admitting: Family Medicine

## 2021-11-06 DIAGNOSIS — I10 Essential (primary) hypertension: Secondary | ICD-10-CM

## 2021-11-07 ENCOUNTER — Institutional Professional Consult (permissible substitution): Payer: Self-pay | Admitting: Neurology

## 2021-11-07 ENCOUNTER — Ambulatory Visit: Payer: Self-pay

## 2021-11-07 DIAGNOSIS — I10 Essential (primary) hypertension: Secondary | ICD-10-CM

## 2021-11-07 NOTE — Telephone Encounter (Signed)
This was refused because it was filled [id number].   [facility] [phone].

## 2021-11-07 NOTE — Telephone Encounter (Signed)
Pt was advised [id number] [place], [address] to get her meds. FYI

## 2021-11-07 NOTE — Chronic Care Management (AMB) (Signed)
Care Management    RN Visit Note  11/07/2021 Name: Ashley Mccullough MRN: [id number] DOB: 03/25/1961  Subjective: Ashley Mccullough is a 61 y.o. year old female who is a primary care patient of Ashley Sprout, FNP. The care management team was consulted for assistance with disease management and care coordination needs.    Engaged with patient by telephone for follow up visit in response to provider referral for case management and care coordination services.   Consent to Services:   Ashley Mccullough was given information about Care Management services including:  Care Management services includes personalized support from designated clinical staff supervised by her physician, including individualized plan of care and coordination with other care providers 24/7 contact phone numbers for assistance for urgent and routine care needs. The patient may stop case management services at any time by phone call to the office staff.  Patient agreed to services and consent obtained.   Assessment: Review of patient past medical history, allergies, medications, health status, including review of consultants reports, laboratory and other test data, was performed as part of comprehensive evaluation and provision of chronic care management services.   SDOH (Social Determinants of Health) assessments and interventions performed: No  Care Plan  Allergies  Allergen Reactions   Cashew Nut Oil Hives, Itching and Swelling   Latex Itching   Norvasc [Amlodipine] Other (See Comments)    Dizziness, flushed   Simvastatin     Other reaction(s): Unknown Memory changes Memory changes   Sulfa Antibiotics     Other reaction(s): Unknown   Levofloxacin Rash   Prednisone Rash    Outpatient Encounter Medications as of 11/07/2021  Medication Sig Note   acetaminophen (TYLENOL) 650 MG CR tablet Take 650-1,300 mg by mouth at bedtime. As needed for arthritis.    amLODipine (NORVASC) 5 MG tablet Take 1 tablet by  mouth daily. (Patient not taking: Reported on 10/21/2021)    buPROPion (WELLBUTRIN XL) 150 MG 24 hr tablet Take 1 tablet by mouth daily.    Cetirizine HCl 10 MG CAPS Take by mouth. 01/19/2019: Pt takes 1 tablet daily.   Cholecalciferol (VITAMIN D3) 5000 units TABS Take 5,000 tablets by mouth daily.    COLLAGEN PO Take by mouth.    FLUoxetine (PROZAC) 20 MG capsule Take by mouth. 10/21/2021: Reports dose changed to 20 mg (three times a day) for total of 40m   gabapentin (NEURONTIN) 300 MG capsule Take 2 capsules by mouth 3 (three) times daily. 10/21/2021: Reports taking twice a day   hydrOXYzine (ATARAX/VISTARIL) 25 MG tablet Take 1 tablet (25 mg total) by mouth every 4 (four) hours as needed for anxiety.    losartan-hydrochlorothiazide (HYZAAR) 100-25 MG tablet Take 1 tablet by mouth daily.    meloxicam (MOBIC) 15 MG tablet Take 1 tablet (15 mg total) by mouth daily. (Patient not taking: Reported on 10/17/2021)    metaxalone (SKELAXIN) 800 MG tablet Take 0.5-1 tablets (400-800 mg total) by mouth 3 (three) times daily as needed for muscle spasms. (Patient not taking: Reported on 10/21/2021)    metoprolol succinate (TOPROL-XL) 25 MG 24 hr tablet TAKE 1 TABLET(25 MG) BY MOUTH DAILY    montelukast (SINGULAIR) 10 MG tablet Take 1 tablet by mouth daily.    traMADol (ULTRAM) 50 MG tablet  10/21/2021: Reports taking very rarely if needed   traZODone (DESYREL) 100 MG tablet Take 1-2 tablets at night prior to sleep    No facility-administered encounter medications on file as of 11/07/2021.  Patient Active Problem List   Diagnosis Date Noted   Annual physical exam 10/17/2021   Depression, major, single episode, severe ([place]) 10/17/2021   Chronic kidney disease (CKD) stage G4/A1, severely decreased glomerular filtration rate (GFR) between 15-29 mL/min/1.73 square meter and albuminuria creatinine ratio less than 30 mg/g ([place]) 10/17/2021   Primary hypertension 10/17/2021   CKD (chronic kidney disease) stage 4, GFR  15-29 ml/min ([place]) 06/06/2021   Hypersomnia 06/05/2021   Lumbar radiculitis 06/05/2021   Encounter for screening mammogram for malignant neoplasm of breast 06/05/2021   Chronic pain syndrome 04/11/2021   Bilateral primary osteoarthritis of knee 04/11/2021   Lumbar spondylosis 04/11/2021   Sacroiliac joint pain 04/11/2021   Chronic pain of both knees 06/21/2020   Chronic radicular lumbar pain 11/18/2019   Spinal stenosis of lumbar region with neurogenic claudication 11/18/2019   DDD (degenerative disc disease), lumbosacral 11/18/2019   Mild episode of recurrent major depressive disorder ([place]) 10/26/2018   BMI 60.0-69.9, adult ([place]) 07/01/2017   Morbid obesity due to excess calories ([place]) 12/24/2015   Allergic rhinitis 05/29/2015   Anxiety, generalized 05/29/2015   D (diarrhea) 05/29/2015   Avitaminosis D 05/29/2015   Prediabetes 05/29/2015   Fatigue 05/29/2015   Hypertension 03/15/2015   Cannot sleep 11/13/2009   Congenital pes planus 03/13/2008   Hypercholesterolemia without hypertriglyceridemia 09/16/2006    Patient Care Plan: RN Care Management Plan of Care     Problem Identified: HTN, HLD, CKD, Anxiety and Depression      Long-Range Goal: Disease Progression Prevented or Minimized   Start Date: 10/21/2021  Expected End Date: 01/19/2022  Priority: High  Note:   Current Barriers:  Care Management support and education needs related to HTN, HLD, Anxiety, and Depression .  RNCM Clinical Goal(s):  Patient will demonstrate improved adherence to prescribed treatment plan for HTN, HLD, Anxiety, and Depression through collaboration with the provider, RN Care Manager and the care team.   Interventions: 1:1 collaboration with primary care provider regarding development and update of comprehensive plan of care as evidenced by provider attestation and co-signature Inter-disciplinary care team collaboration (see longitudinal plan of care) Evaluation of current treatment plan related  to  self management and patient's adherence to plan as established by provider   Hyperlipidemia Interventions:  (Status:  New goal.) Long Term Goal Lab Results  Component Value Date   CHOL 257 (H) 10/17/2021   HDL 65 10/17/2021   LDLCALC 177 (H) 10/17/2021   TRIG 90 10/17/2021   CHOLHDL 4.0 10/17/2021    Reviewed provider established cholesterol goals Discussed importance of completing regular laboratory monitoring as prescribed Reviewed importance of limiting foods high in cholesterol Reviewed exercise goals. Mobility is limited due chronic shoulder and joint pain. Advised to walk and engage in low impact exercises as tolerated.     Hypertension Interventions:  (Status:  New goal.) Long Term Goal Last practice recorded BP readings:  BP Readings from Last 3 Encounters:  10/17/21 (!) 152/89  06/05/21 (!) 158/93  04/29/21 (!) 175/88  Most recent eGFR/CrCl:  Lab Results  Component Value Date   EGFR 29 (L) 10/17/2021    No components found for: CRCL  Reviewed medications and indications for use. Advised to continue taking as prescribed and notify a clinic provider if unable to tolerate the regimen. Discussed recent change in insurance plan. Encouraged to update the care management team with concerns regarding medication management or prescription cost. Provided information regarding established blood pressure parameters along with indications for notifying a provider.  Advised to monitor and record readings.  Discussed compliance with recommended cardiac prudent diet. Encouraged to read nutrition labels, monitor sodium intake, and avoid highly processed foods when possible. Discussed complications of uncontrolled blood pressure.  Reviewed s/sx of heart attack, stroke and worsening symptoms that require immediate medical attention. Update 11/07/21: Patient contacted the team to request assistance with obtaining medications. Reports she is currently out of metoprolol. She attempted to  [facility]ill [facility] within the last week. Reports being unable to obtain refills due to pending change in insurance coverage to [place]. Reports being informed that prescriptions at [facility] will not be covered. Reports being advised by [facility]. Per pharmacy staff, patient has pending refills at [facility] however they are unable to dispense d/t the system [facility]  [facility] Per pharmacy staff, the patient's profile does not indicate an order for metoprolol. They are requesting an order. Message forwarded to PCP. Pharmacy staff agreed to contact the patient when the order is received, and the medication is ready for pick up. Thorough discussion with patient regarding indications for seeking immediate medical attention. She has not taken metoprolol in approximately a week. Reports episodes of anxiety but denies chest discomfort, palpitations, headaches, or shortness of breath. She plans to obtain a new BP device and monitor readings. Reviewed indications for contacting the provider. Will follow up tomorrow to confirm needed medications were received.  Wellness Interventions:    Reviewed current treatment plan r/t depression and anxiety. Reports adhering to current medication plan. Denies suicidal ideations. Reports significant improvement with sleep hygiene since taking hydroxyzine. Symptoms r/t depression and anxiety are chronic. Reports experiencing these symptoms since childhood. Recalls starting therapy/counseling at the age of 22. Feels that symptoms are manageable with the current medications. Agrees that she would benefit from additional counseling and Psychiatric services. Confirmed pending outreach with the CCM LCSW later this month. Expressed interest in establishing care with a new Psychiatrist in the [place] area. Agreeable to outreach with the team at  [facility]. Will follow up to confirm referral approval. Update 11/01/21: Collaboration with intake staff at [facility] regarding request for Psychiatry services. Per staff, primary focus will be medication management and routine evaluation. Most sessions are currently being conducted virtually. Discussed insurance coverage and out of pocket expenses. During previous outreach, patient mentioned possible transition to a different insurance provider. Intake staff indicated patient will be charged $250 for the initial visit and $200 for subsequent visits if her insurance provider declines coverage. Will follow up with patient to confirm anticipated coverage.    Patient Goals/Self-Care Activities: Take all medications as prescribed Attend all scheduled provider appointments Call pharmacy for medication refills 3-7 days in advance of running out of medications Perform all self care activities independently  Call provider office for new concerns or questions        PLAN Will follow up to confirm medications were received.   Ashley Mccullough [facility] Care Management [facility] [phone]

## 2021-11-08 ENCOUNTER — Ambulatory Visit: Payer: Self-pay

## 2021-11-08 ENCOUNTER — Other Ambulatory Visit: Payer: Self-pay | Admitting: Family Medicine

## 2021-11-08 DIAGNOSIS — I10 Essential (primary) hypertension: Secondary | ICD-10-CM

## 2021-11-08 MED ORDER — METOPROLOL SUCCINATE ER 25 MG PO TB24: ORAL_TABLET | ORAL | 1 refills | Status: DC

## 2021-11-08 NOTE — Chronic Care Management (AMB) (Cosign Needed)
Care Management    RN Visit Note  11/08/2021 Name: Ashley Mccullough MRN: [id number] DOB: 05-27-1961  Subjective: Ashley Mccullough is a 60 y.o. year old female who is a primary care patient of Ashley Sprout, FNP. The care management team was consulted for assistance with disease management and care coordination needs.    Engaged with patient by telephone for follow up visit in response to provider referral for case management and care coordination services.   Consent to Services:   [name] was given information about Care Management services today including:  Care Management services includes personalized support from designated clinical staff supervised by her physician, including individualized plan of care and coordination with other care providers 24/7 contact phone numbers for assistance for urgent and routine care needs. The patient may stop case management services at any time by phone call to the office staff.  Patient agreed to services and consent obtained.   Assessment: Review of patient past medical history, allergies, medications, health status, including review of consultants reports, laboratory and other test data, was performed as part of comprehensive evaluation and provision of chronic care management services.   SDOH (Social Determinants of Health) assessments and interventions performed: No  Care Plan  Allergies  Allergen Reactions   Cashew Nut Oil Hives, Itching and Swelling   Latex Itching   Norvasc [Amlodipine] Other (See Comments)    Dizziness, flushed   Simvastatin     Other reaction(s): Unknown Memory changes Memory changes   Sulfa Antibiotics     Other reaction(s): Unknown   Levofloxacin Rash   Prednisone Rash    Outpatient Encounter Medications as of 11/08/2021  Medication Sig Note   acetaminophen (TYLENOL) 650 MG CR tablet Take 650-1,300 mg by mouth at bedtime. As needed for arthritis.    amLODipine (NORVASC) 5 MG tablet Take 1  tablet by mouth daily. (Patient not taking: Reported on 10/21/2021)    buPROPion (WELLBUTRIN XL) 150 MG 24 hr tablet Take 1 tablet by mouth daily.    Cetirizine HCl 10 MG CAPS Take by mouth. 01/19/2019: Pt takes 1 tablet daily.   Cholecalciferol (VITAMIN D3) 5000 units TABS Take 5,000 tablets by mouth daily.    COLLAGEN PO Take by mouth.    FLUoxetine (PROZAC) 20 MG capsule Take by mouth. 10/21/2021: Reports dose changed to 20 mg (three times a day) for total of 57m   gabapentin (NEURONTIN) 300 MG capsule Take 2 capsules by mouth 3 (three) times daily. 10/21/2021: Reports taking twice a day   hydrOXYzine (ATARAX/VISTARIL) 25 MG tablet Take 1 tablet (25 mg total) by mouth every 4 (four) hours as needed for anxiety.    losartan-hydrochlorothiazide (HYZAAR) 100-25 MG tablet Take 1 tablet by mouth daily.    meloxicam (MOBIC) 15 MG tablet Take 1 tablet (15 mg total) by mouth daily. (Patient not taking: Reported on 10/17/2021)    metaxalone (SKELAXIN) 800 MG tablet Take 0.5-1 tablets (400-800 mg total) by mouth 3 (three) times daily as needed for muscle spasms. (Patient not taking: Reported on 10/21/2021)    metoprolol succinate (TOPROL-XL) 25 MG 24 hr tablet TAKE 1 TABLET(25 MG) BY MOUTH DAILY    montelukast (SINGULAIR) 10 MG tablet Take 1 tablet by mouth daily.    traMADol (ULTRAM) 50 MG tablet  10/21/2021: Reports taking very rarely if needed   traZODone (DESYREL) 100 MG tablet Take 1-2 tablets at night prior to sleep    No facility-administered encounter medications on file as of 11/08/2021.  Patient Active Problem List   Diagnosis Date Noted   Annual physical exam 10/17/2021   Depression, major, single episode, severe ([place]) 10/17/2021   Chronic kidney disease (CKD) stage G4/A1, severely decreased glomerular filtration rate (GFR) between 15-29 mL/min/1.73 square meter and albuminuria creatinine ratio less than 30 mg/g ([place]) 10/17/2021   Primary hypertension 10/17/2021   CKD (chronic kidney disease) stage  4, GFR 15-29 ml/min ([place]) 06/06/2021   Hypersomnia 06/05/2021   Lumbar radiculitis 06/05/2021   Encounter for screening mammogram for malignant neoplasm of breast 06/05/2021   Chronic pain syndrome 04/11/2021   Bilateral primary osteoarthritis of knee 04/11/2021   Lumbar spondylosis 04/11/2021   Sacroiliac joint pain 04/11/2021   Chronic pain of both knees 06/21/2020   Chronic radicular lumbar pain 11/18/2019   Spinal stenosis of lumbar region with neurogenic claudication 11/18/2019   DDD (degenerative disc disease), lumbosacral 11/18/2019   Mild episode of recurrent major depressive disorder ([place]) 10/26/2018   BMI 60.0-69.9, adult ([place]) 07/01/2017   Morbid obesity due to excess calories ([place]) 12/24/2015   Allergic rhinitis 05/29/2015   Anxiety, generalized 05/29/2015   D (diarrhea) 05/29/2015   Avitaminosis D 05/29/2015   Prediabetes 05/29/2015   Fatigue 05/29/2015   Hypertension 03/15/2015   Cannot sleep 11/13/2009   Congenital pes planus 03/13/2008   Hypercholesterolemia without hypertriglyceridemia 09/16/2006     Patient Care Plan: RN Care Management Plan of Care     Problem Identified: HTN, HLD, CKD, Anxiety and Depression      Long-Range Goal: Disease Progression Prevented or Minimized   Start Date: 10/21/2021  Expected End Date: 01/19/2022  Priority: High  Note:   Current Barriers:  Care Management support and education needs related to HTN, HLD, Anxiety, and Depression .  RNCM Clinical Goal(s):  Patient will demonstrate improved adherence to prescribed treatment plan for HTN, HLD, Anxiety, and Depression through collaboration with the provider, RN Care Manager and the care team.   Interventions: 1:1 collaboration with primary care provider regarding development and update of comprehensive plan of care as evidenced by provider attestation and co-signature Inter-disciplinary care team collaboration (see longitudinal plan of care) Evaluation of current treatment plan  related to  self management and patient's adherence to plan as established by provider   Hyperlipidemia Interventions:  (Status:  New goal.) Long Term Goal Lab Results  Component Value Date   CHOL 257 (H) 10/17/2021   HDL 65 10/17/2021   LDLCALC 177 (H) 10/17/2021   TRIG 90 10/17/2021   CHOLHDL 4.0 10/17/2021    Reviewed provider established cholesterol goals Discussed importance of completing regular laboratory monitoring as prescribed Reviewed importance of limiting foods high in cholesterol Reviewed exercise goals. Mobility is limited due chronic shoulder and joint pain. Advised to walk and engage in low impact exercises as tolerated.   Hypertension Interventions:  (Status:  New goal.) Long Term Goal Last practice recorded BP readings:  BP Readings from Last 3 Encounters:  10/17/21 (!) 152/89  06/05/21 (!) 158/93  04/29/21 (!) 175/88  Most recent eGFR/CrCl:  Lab Results  Component Value Date   EGFR 29 (L) 10/17/2021    No components found for: CRCL  Reviewed medications and indications for use. Advised to continue taking as prescribed and notify a clinic provider if unable to tolerate the regimen. Discussed recent change in insurance plan. Encouraged to update the care management team with concerns regarding medication management or prescription cost. Provided information regarding established blood pressure parameters along with indications for notifying a provider. Advised  to monitor and record readings.  Discussed compliance with recommended cardiac prudent diet. Encouraged to read nutrition labels, monitor sodium intake, and avoid highly processed foods when possible. Discussed complications of uncontrolled blood pressure.  Reviewed s/sx of heart attack, stroke and worsening symptoms that require immediate medical attention. Update 11/07/21: Patient contacted the team to request assistance with obtaining medications. Reports she is currently out of metoprolol. She attempted  to obtain a refill via [facility] within the last week. Reports being unable to obtain refills due to pending change in insurance coverage to [place]. Reports being informed that prescriptions at [facility] will not be covered. Reports being advised by the [facility] team to [facility]. [facility]. Per pharmacy staff, patient has pending refills at [facility] however they are unable to dispense d/t the system indicating the prescription was filled at [facility].  [facility]ed [facility]. Per pharmacy staff, the patient's profile does not indicate an order for metoprolol. They are requesting an order. Message forwarded to PCP. Pharmacy staff agreed to contact the patient when the order is received, and the medication is ready for pick up. Thorough discussion with patient regarding indications for seeking immediate medical attention. She has not taken metoprolol in approximately a week. Reports episodes of anxiety but denies chest discomfort, palpitations, headaches, or shortness of breath. She plans to obtain a new BP device and monitor readings. Reviewed indications for contacting the provider. Will follow up tomorrow to confirm needed medications were received. Update 11/08/21: Follow up regarding medications. Patient called to inform staff she was unable to obtain metoprolol as expected. [facility]ed [facility] staff. Staff initially indicated her prescription was available for pick up on the evening of 11/07/21, however patient was still unable to obtain the prescription. Reports the pharmacy tech was unable to verify the order.  Medication dose reviewed with pharmacy staff. Primary care provider confirmed the requested order for metoprolol 25 mg was submitted. The [facility] pharmacy staff indicated that the prescription will be available for patient to pick up today. Patient will contact the clinic if she the medication is still unavailable or if she requires additional  assistance.  Wellness Interventions:    Reviewed current treatment plan r/t depression and anxiety. Reports adhering to current medication plan. Denies suicidal ideations. Reports significant improvement with sleep hygiene since taking hydroxyzine. Symptoms r/t depression and anxiety are chronic. Reports experiencing these symptoms since childhood. Recalls starting therapy/counseling at the age of 75. Feels that symptoms are manageable with the current medications. Agrees that she would benefit from additional counseling and Psychiatric services. Confirmed pending outreach with the CCM LCSW later this month. Expressed interest in establishing care with a new Psychiatrist in the [place] area. Agreeable to outreach with the team at [facility]. Will follow up to confirm referral approval. Update 11/01/21: Collaboration with intake staff at [facility] regarding request for Psychiatry services. Per staff, primary focus will be medication management and routine evaluation. Most sessions are currently being conducted virtually. Discussed insurance coverage and out of pocket expenses. During previous outreach, patient mentioned possible transition to a different insurance provider. Intake staff indicated patient will be charged $250 for the initial visit and $200 for subsequent visits if her insurance provider declines coverage. Will follow up with patient to confirm anticipated coverage.    Patient Goals/Self-Care Activities: Take all medications as prescribed Attend all scheduled provider appointments Call pharmacy for medication refills 3-7 days in advance of running out of medications Perform all self care activities  independently  Call provider office for new concerns or questions        PLAN:  Ms. Cassar will call or contact the clinic if she requires additional assistance with medications. A member of the care management team will follow up next month.   Cristy Friedlander  Health/[facility] Care Management [facility] [phone]

## 2021-11-15 ENCOUNTER — Telehealth: Payer: 59

## 2021-11-15 ENCOUNTER — Ambulatory Visit: Payer: 59

## 2021-11-15 DIAGNOSIS — I10 Essential (primary) hypertension: Secondary | ICD-10-CM

## 2021-11-15 DIAGNOSIS — F322 Major depressive disorder, single episode, severe without psychotic features: Secondary | ICD-10-CM

## 2021-11-15 NOTE — Chronic Care Management (AMB) (Unsigned)
Chronic Care Management   CCM RN Visit Note  11/15/2021 Name: Ashley Mccullough MRN: [id number] DOB: [birth year]  Subjective: Ashley Mccullough is a 61 y.o. year old female who is a primary care patient of Gwyneth Sprout, FNP. The care management team was consulted for assistance with disease management and care coordination needs.    {CCMTELEPHONEFACETOFACE:21091510} for {CCMINITIALFOLLOWUPCHOICE:21091511} in response to provider referral for case management and/or care coordination services.   Consent to Services:  {CCMCONSENTOPTIONS:25074}  Patient agreed to services and verbal consent obtained.   Assessment: Review of patient past medical history, allergies, medications, health status, including review of consultants reports, laboratory and other test data, was performed as part of comprehensive evaluation and provision of chronic care management services.   SDOH (Social Determinants of Health) assessments and interventions performed:    CCM Care Plan  Allergies  Allergen Reactions   Cashew Nut Oil Hives, Itching and Swelling   Latex Itching   Norvasc [Amlodipine] Other (See Comments)    Dizziness, flushed   Simvastatin     Other reaction(s): Unknown Memory changes Memory changes   Sulfa Antibiotics     Other reaction(s): Unknown   Levofloxacin Rash   Prednisone Rash    Outpatient Encounter Medications as of 11/15/2021  Medication Sig Note   acetaminophen (TYLENOL) 650 MG CR tablet Take 650-1,300 mg by mouth at bedtime. As needed for arthritis.    amLODipine (NORVASC) 5 MG tablet Take 1 tablet by mouth daily. (Patient not taking: Reported on 10/21/2021)    buPROPion (WELLBUTRIN XL) 150 MG 24 hr tablet Take 1 tablet by mouth daily.    Cetirizine HCl 10 MG CAPS Take by mouth. 01/19/2019: Pt takes 1 tablet daily.   Cholecalciferol (VITAMIN D3) 5000 units TABS Take 5,000 tablets by mouth daily.    COLLAGEN PO Take by mouth.    FLUoxetine (PROZAC) 20 MG capsule Take by  mouth. 10/21/2021: Reports dose changed to 20 mg (three times a day) for total of 60mg    gabapentin (NEURONTIN) 300 MG capsule Take 2 capsules by mouth 3 (three) times daily. 10/21/2021: Reports taking twice a day   hydrOXYzine (ATARAX/VISTARIL) 25 MG tablet Take 1 tablet (25 mg total) by mouth every 4 (four) hours as needed for anxiety.    losartan-hydrochlorothiazide (HYZAAR) 100-25 MG tablet Take 1 tablet by mouth daily.    meloxicam (MOBIC) 15 MG tablet Take 1 tablet (15 mg total) by mouth daily. (Patient not taking: Reported on 10/17/2021)    metaxalone (SKELAXIN) 800 MG tablet Take 0.5-1 tablets (400-800 mg total) by mouth 3 (three) times daily as needed for muscle spasms. (Patient not taking: Reported on 10/21/2021)    metoprolol succinate (TOPROL-XL) 25 MG 24 hr tablet TAKE 1 TABLET(25 MG) BY MOUTH DAILY    montelukast (SINGULAIR) 10 MG tablet Take 1 tablet by mouth daily.    traMADol (ULTRAM) 50 MG tablet  10/21/2021: Reports taking very rarely if needed   traZODone (DESYREL) 100 MG tablet Take 1-2 tablets at night prior to sleep    No facility-administered encounter medications on file as of 11/15/2021.    Patient Active Problem List   Diagnosis Date Noted   Annual physical exam 10/17/2021   Depression, major, single episode, severe ([place]) 10/17/2021   Chronic kidney disease (CKD) stage G4/A1, severely decreased glomerular filtration rate (GFR) between 15-29 mL/min/1.73 square meter and albuminuria creatinine ratio less than 30 mg/g ([place]) 10/17/2021   Primary hypertension 10/17/2021   CKD (chronic kidney disease) stage 4,  GFR 15-29 ml/min ([facility]) 06/06/2021   Hypersomnia 06/05/2021   Lumbar radiculitis 06/05/2021   Encounter for screening mammogram for malignant neoplasm of breast 06/05/2021   Chronic pain syndrome 04/11/2021   Bilateral primary osteoarthritis of knee 04/11/2021   Lumbar spondylosis 04/11/2021   Sacroiliac joint pain 04/11/2021   Chronic pain of both knees 06/21/2020    Chronic radicular lumbar pain 11/18/2019   Spinal stenosis of lumbar region with neurogenic claudication 11/18/2019   DDD (degenerative disc disease), lumbosacral 11/18/2019   Mild episode of recurrent major depressive disorder ([place]) 10/26/2018   BMI 60.0-69.9, adult ([place]) 07/01/2017   Morbid obesity due to excess calories ([place]) 12/24/2015   Allergic rhinitis 05/29/2015   Anxiety, generalized 05/29/2015   D (diarrhea) 05/29/2015   Avitaminosis D 05/29/2015   Prediabetes 05/29/2015   Fatigue 05/29/2015   Hypertension 03/15/2015   Cannot sleep 11/13/2009   Congenital pes planus 03/13/2008   Hypercholesterolemia without hypertriglyceridemia 09/16/2006    Conditions to be addressed/monitored:{CCM ASSESSMENT DZ OPTIONS:25047}  There are no care plans that you recently modified to display for this patient.   Plan:{CM FOLLOW UP PLAN:25073} SIG***

## 2021-11-19 ENCOUNTER — Ambulatory Visit: Payer: 59 | Admitting: *Deleted

## 2021-11-19 DIAGNOSIS — F322 Major depressive disorder, single episode, severe without psychotic features: Secondary | ICD-10-CM

## 2021-11-19 DIAGNOSIS — I10 Essential (primary) hypertension: Secondary | ICD-10-CM

## 2021-11-19 NOTE — Patient Instructions (Signed)
Visit Information ? ?Thank you for taking time to visit with me today. Please don't hesitate to contact me if I can be of assistance to you before our next scheduled telephone appointment. ? ?Following are the goals we discussed today:  ?(Copy and paste patient goals from clinical care plan here) ? ?Our next appointment is by telephone on 12/03/21 at 11am ? ?Please call the care guide team at [phone] if you need to cancel or reschedule your appointment.  ? ?If you are experiencing a Mental Health or [place] or need someone to talk to, please call the Suicide and Crisis Lifeline: 988  ? ?Patient verbalizes understanding of instructions and care plan provided today and agrees to view in [place]. Active MyChart status confirmed with patient.   ? ?Telephone follow up appointment with care management team member scheduled for: 12/03/21 ? ? ? ?[employer], LCSW ?Clinical Social Worker  ?[facility] ?[phone] ? ?

## 2021-11-19 NOTE — Chronic Care Management (AMB) (Signed)
?Chronic Care Management  ? ? Clinical Social Work Note ? ?11/19/2021 ?Name: Ashley Mccullough MRN: [id number] DOB: 01-19-1961 ? ?Ashley Mccullough is a 61 y.o. year old female who is a primary care patient of Gwyneth Sprout, FNP. The CCM team was consulted to assist the patient with chronic disease management and/or care coordination needs related to: Mental Health Counseling and Resources.  ? ?Engaged with patient by telephone for follow up visit in response to provider referral for social work chronic care management and care coordination services.  ? ?Consent to Services:  ?The patient was given information about Chronic Care Management services, agreed to services, and gave verbal consent prior to initiation of services.  Please see initial visit note for detailed documentation.  ? ?Patient agreed to services and consent obtained.  ? ?Assessment: Review of patient past medical history, allergies, medications, and health status, including review of relevant consultants reports was performed today as part of a comprehensive evaluation and provision of chronic care management and care coordination services.    ? ?SDOH (Social Determinants of Health) assessments and interventions performed:   ? ?Advanced Directives Status: Not addressed in this encounter. ? ?CCM Care Plan ? ?Allergies  ?Allergen Reactions  ? Cashew Nut Oil Hives, Itching and Swelling  ? Latex Itching  ? Norvasc [Amlodipine] Other (See Comments)  ?  Dizziness, flushed  ? Simvastatin   ?  Other reaction(s): Unknown ?Memory changes ?Memory changes  ? Sulfa Antibiotics   ?  Other reaction(s): Unknown  ? Levofloxacin Rash  ? Prednisone Rash  ? ? ?Outpatient Encounter Medications as of 11/19/2021  ?Medication Sig Note  ? acetaminophen (TYLENOL) 650 MG CR tablet Take 650-1,300 mg by mouth at bedtime. As needed for arthritis.   ? amLODipine (NORVASC) 5 MG tablet Take 1 tablet by mouth daily. (Patient not taking: Reported on 10/21/2021)   ? buPROPion  (WELLBUTRIN XL) 150 MG 24 hr tablet Take 1 tablet by mouth daily.   ? Cetirizine HCl 10 MG CAPS Take by mouth. 01/19/2019: Pt takes 1 tablet daily.  ? Cholecalciferol (VITAMIN D3) 5000 units TABS Take 5,000 tablets by mouth daily.   ? COLLAGEN PO Take by mouth.   ? FLUoxetine (PROZAC) 20 MG capsule Take by mouth. 10/21/2021: Reports dose changed to 20 mg (three times a day) for total of 60mg   ? gabapentin (NEURONTIN) 300 MG capsule Take 2 capsules by mouth 3 (three) times daily. 10/21/2021: Reports taking twice a day  ? hydrOXYzine (ATARAX/VISTARIL) 25 MG tablet Take 1 tablet (25 mg total) by mouth every 4 (four) hours as needed for anxiety.   ? losartan-hydrochlorothiazide (HYZAAR) 100-25 MG tablet Take 1 tablet by mouth daily.   ? meloxicam (MOBIC) 15 MG tablet Take 1 tablet (15 mg total) by mouth daily. (Patient not taking: Reported on 10/17/2021)   ? metaxalone (SKELAXIN) 800 MG tablet Take 0.5-1 tablets (400-800 mg total) by mouth 3 (three) times daily as needed for muscle spasms. (Patient not taking: Reported on 10/21/2021)   ? metoprolol succinate (TOPROL-XL) 25 MG 24 hr tablet TAKE 1 TABLET(25 MG) BY MOUTH DAILY   ? montelukast (SINGULAIR) 10 MG tablet Take 1 tablet by mouth daily.   ? traMADol (ULTRAM) 50 MG tablet  10/21/2021: Reports taking very rarely if needed  ? traZODone (DESYREL) 100 MG tablet Take 1-2 tablets at night prior to sleep   ? ?No facility-administered encounter medications on file as of 11/19/2021.  ? ? ?Patient Active Problem List  ?  Diagnosis Date Noted  ? Annual physical exam 10/17/2021  ? Depression, major, single episode, severe ([place]) 10/17/2021  ? Chronic kidney disease (CKD) stage G4/A1, severely decreased glomerular filtration rate (GFR) between 15-29 mL/min/1.73 square meter and albuminuria creatinine ratio less than 30 mg/g (HCC) 10/17/2021  ? Primary hypertension 10/17/2021  ? CKD (chronic kidney disease) stage 4, GFR 15-29 ml/min (HCC) 06/06/2021  ? Hypersomnia 06/05/2021  ? Lumbar  radiculitis 06/05/2021  ? Encounter for screening mammogram for malignant neoplasm of breast 06/05/2021  ? Chronic pain syndrome 04/11/2021  ? Bilateral primary osteoarthritis of knee 04/11/2021  ? Lumbar spondylosis 04/11/2021  ? Sacroiliac joint pain 04/11/2021  ? Chronic pain of both knees 06/21/2020  ? Chronic radicular lumbar pain 11/18/2019  ? Spinal stenosis of lumbar region with neurogenic claudication 11/18/2019  ? DDD (degenerative disc disease), lumbosacral 11/18/2019  ? Mild episode of recurrent major depressive disorder ([place]) 10/26/2018  ? BMI 60.0-69.9, adult ([place]) 07/01/2017  ? Morbid obesity due to excess calories ([place]) 12/24/2015  ? Allergic rhinitis 05/29/2015  ? Anxiety, generalized 05/29/2015  ? D (diarrhea) 05/29/2015  ? Avitaminosis D 05/29/2015  ? Prediabetes 05/29/2015  ? Fatigue 05/29/2015  ? Hypertension 03/15/2015  ? Cannot sleep 11/13/2009  ? Congenital pes planus 03/13/2008  ? Hypercholesterolemia without hypertriglyceridemia 09/16/2006  ? ? ?Conditions to be addressed/monitored: Depression; Mental Health Concerns  ? ?Care Plan : RN Care Management Plan of Care  ?Updates made by Vern Claude, LCSW since 11/19/2021 12:00 AM  ?  ? ?Problem: CHL AMB "PATIENT-SPECIFIC PROBLEM"   ?Note:   ?CARE PLAN ENTRY ?(see longitudinal plan of care for additional care plan information) ? ?Current Barriers:  ?Knowledge deficits related to accessing mental health provider in patient with Depression  ?Patient is experiencing symptoms of  depression which seem to be excarerbated by feelings overwhelm in regards to her family dynamics ?Patient needs Support, Education, and Care Coordination in order to meet unmet mental health needs  ?Mental Health Concerns  ? ?Clinical Social Work Goal(s):  ?Over the next 90 days, patient will work with SW bi-weekly by telephone or in person to reduce or manage symptoms of depression until connected for ongoing counseling resources.  ?Patient continues to discuss family  conflicts and difficulty maintaining boundaries ?Patient will implement clinical interventions discussed today to decreases symptoms of depression and increase knowledge and/or ability of: coping skills. ?Interventions:  ?Assessed patient's understanding, education, previous treatment and care coordination needs  ?Patient continues to discuss difficulty dealing with the death of her mother-discussed having a lack of motivation and symptoms of fatigue ?Positive coping strategies discussed and reinforced-setting personal boundaries, reading, beading, music-getting back to what brings peace ?Discussed prioritizing tasks-breaking them down to smaller more manageable tasks ?Discussed possibility of intensive outpatient program, group therapy, individual therapy ?Patient continues to be interested in group therapy-following resources provided for group therapy: [facility] [phone] and [facility] [phone] ?Provided basic mental health support, education and interventions  ?Collaborated with appropriate clinical care team members regarding patient needs through provider co-signature ?Discussed several options for long term counseling based on need and insurance.  ?Reviewed mental health medications with patient prescribed by PCP and discussed compliance  ?Other interventions include: Active listening / Reflection utilized  ?Emotional Support Provided  ? ?Patient Self Care Activities & Deficits:  ?Patient is unable to independently navigate community resource options without care coordination support ?Patient is able to implement clinical interventions discussed today and is motivated for treatment  ?  Patient will select one of the agencies from the list provided and call to schedule an appointment  ?Performs ADL's independently ?Performs IADL's independently ?Ability for insight ?Motivation for treatment ?Strong family or social support ? ?Initial goal documentation ? ?  ?   ? ?Follow Up Plan: SW will follow up with patient by phone over the next 14 business days ?     ? ?Ashley Mcsorley, LCSW ?Clinical Social Worker  ?[facility] ?[phone] ? ? ? ?

## 2021-11-21 ENCOUNTER — Encounter: Payer: Self-pay | Admitting: Dietician

## 2021-11-21 DIAGNOSIS — R69 Illness, unspecified: Secondary | ICD-10-CM | POA: Diagnosis not present

## 2021-11-21 DIAGNOSIS — G4733 Obstructive sleep apnea (adult) (pediatric): Secondary | ICD-10-CM | POA: Diagnosis not present

## 2021-11-21 DIAGNOSIS — N184 Chronic kidney disease, stage 4 (severe): Secondary | ICD-10-CM | POA: Diagnosis not present

## 2021-11-21 DIAGNOSIS — I10 Essential (primary) hypertension: Secondary | ICD-10-CM | POA: Diagnosis not present

## 2021-11-21 DIAGNOSIS — D631 Anemia in chronic kidney disease: Secondary | ICD-10-CM | POA: Diagnosis not present

## 2021-11-21 DIAGNOSIS — E668 Other obesity: Secondary | ICD-10-CM | POA: Diagnosis not present

## 2021-11-21 DIAGNOSIS — E785 Hyperlipidemia, unspecified: Secondary | ICD-10-CM | POA: Diagnosis not present

## 2021-11-21 DIAGNOSIS — R8290 Unspecified abnormal findings in urine: Secondary | ICD-10-CM | POA: Diagnosis not present

## 2021-11-21 DIAGNOSIS — R7303 Prediabetes: Secondary | ICD-10-CM | POA: Diagnosis not present

## 2021-11-21 NOTE — Progress Notes (Signed)
Have not heard back from patient to reschedule her cancelled appointment from 10/23/21. Sent notification to referring provider. ?

## 2021-12-03 ENCOUNTER — Ambulatory Visit: Payer: 59 | Admitting: *Deleted

## 2021-12-03 ENCOUNTER — Telehealth: Payer: Self-pay | Admitting: *Deleted

## 2021-12-03 DIAGNOSIS — I10 Essential (primary) hypertension: Secondary | ICD-10-CM

## 2021-12-03 DIAGNOSIS — F322 Major depressive disorder, single episode, severe without psychotic features: Secondary | ICD-10-CM

## 2021-12-03 NOTE — Chronic Care Management (AMB) (Signed)
?Chronic Care Management  ? ? Clinical Social Work Note ? ?12/03/2021 ?Name: Ashley Mccullough MRN: [id number] DOB: 1960-11-28 ? ?Ashley Mccullough is a 61 y.o. year old female who is a primary care patient of Gwyneth Sprout, FNP. The CCM team was consulted to assist the patient with chronic disease management and/or care coordination needs related to: Mental Health Counseling and Resources.  ? ?Engaged with patient by telephone for follow up visit in response to provider referral for social work chronic care management and care coordination services.  ? ?Consent to Services:  ?The patient was given information about Chronic Care Management services, agreed to services, and gave verbal consent prior to initiation of services.  Please see initial visit note for detailed documentation.  ? ?Patient agreed to services and consent obtained.  ? ?Assessment: Review of patient past medical history, allergies, medications, and health status, including review of relevant consultants reports was performed today as part of a comprehensive evaluation and provision of chronic care management and care coordination services.    ? ?SDOH (Social Determinants of Health) assessments and interventions performed:   ? ?Advanced Directives Status: Not addressed in this encounter. ? ?CCM Care Plan ? ?Allergies  ?Allergen Reactions  ? Cashew Nut Oil Hives, Itching and Swelling  ? Latex Itching  ? Norvasc [Amlodipine] Other (See Comments)  ?  Dizziness, flushed  ? Simvastatin   ?  Other reaction(s): Unknown ?Memory changes ?Memory changes  ? Sulfa Antibiotics   ?  Other reaction(s): Unknown  ? Levofloxacin Rash  ? Prednisone Rash  ? ? ?Outpatient Encounter Medications as of 12/03/2021  ?Medication Sig Note  ? acetaminophen (TYLENOL) 650 MG CR tablet Take 650-1,300 mg by mouth at bedtime. As needed for arthritis.   ? amLODipine (NORVASC) 5 MG tablet Take 1 tablet by mouth daily. (Patient not taking: Reported on 10/21/2021)   ? buPROPion  (WELLBUTRIN XL) 150 MG 24 hr tablet Take 1 tablet by mouth daily.   ? Cetirizine HCl 10 MG CAPS Take by mouth. 01/19/2019: Pt takes 1 tablet daily.  ? Cholecalciferol (VITAMIN D3) 5000 units TABS Take 5,000 tablets by mouth daily.   ? COLLAGEN PO Take by mouth.   ? FLUoxetine (PROZAC) 20 MG capsule Take by mouth. 10/21/2021: Reports dose changed to 20 mg (three times a day) for total of 60mg   ? gabapentin (NEURONTIN) 300 MG capsule Take 2 capsules by mouth 3 (three) times daily. 10/21/2021: Reports taking twice a day  ? hydrOXYzine (ATARAX/VISTARIL) 25 MG tablet Take 1 tablet (25 mg total) by mouth every 4 (four) hours as needed for anxiety.   ? losartan-hydrochlorothiazide (HYZAAR) 100-25 MG tablet Take 1 tablet by mouth daily.   ? meloxicam (MOBIC) 15 MG tablet Take 1 tablet (15 mg total) by mouth daily. (Patient not taking: Reported on 10/17/2021)   ? metaxalone (SKELAXIN) 800 MG tablet Take 0.5-1 tablets (400-800 mg total) by mouth 3 (three) times daily as needed for muscle spasms. (Patient not taking: Reported on 10/21/2021)   ? metoprolol succinate (TOPROL-XL) 25 MG 24 hr tablet TAKE 1 TABLET(25 MG) BY MOUTH DAILY   ? montelukast (SINGULAIR) 10 MG tablet Take 1 tablet by mouth daily.   ? traMADol (ULTRAM) 50 MG tablet  10/21/2021: Reports taking very rarely if needed  ? traZODone (DESYREL) 100 MG tablet Take 1-2 tablets at night prior to sleep   ? ?No facility-administered encounter medications on file as of 12/03/2021.  ? ? ?Patient Active Problem List  ?  Diagnosis Date Noted  ? Annual physical exam 10/17/2021  ? Depression, major, single episode, severe ([place]) 10/17/2021  ? Chronic kidney disease (CKD) stage G4/A1, severely decreased glomerular filtration rate (GFR) between 15-29 mL/min/1.73 square meter and albuminuria creatinine ratio less than 30 mg/g (HCC) 10/17/2021  ? Primary hypertension 10/17/2021  ? CKD (chronic kidney disease) stage 4, GFR 15-29 ml/min (HCC) 06/06/2021  ? Hypersomnia 06/05/2021  ? Lumbar  radiculitis 06/05/2021  ? Encounter for screening mammogram for malignant neoplasm of breast 06/05/2021  ? Chronic pain syndrome 04/11/2021  ? Bilateral primary osteoarthritis of knee 04/11/2021  ? Lumbar spondylosis 04/11/2021  ? Sacroiliac joint pain 04/11/2021  ? Chronic pain of both knees 06/21/2020  ? Chronic radicular lumbar pain 11/18/2019  ? Spinal stenosis of lumbar region with neurogenic claudication 11/18/2019  ? DDD (degenerative disc disease), lumbosacral 11/18/2019  ? Mild episode of recurrent major depressive disorder ([place]) 10/26/2018  ? BMI 60.0-69.9, adult ([place]) 07/01/2017  ? Morbid obesity due to excess calories ([place]) 12/24/2015  ? Allergic rhinitis 05/29/2015  ? Anxiety, generalized 05/29/2015  ? D (diarrhea) 05/29/2015  ? Avitaminosis D 05/29/2015  ? Prediabetes 05/29/2015  ? Fatigue 05/29/2015  ? Hypertension 03/15/2015  ? Cannot sleep 11/13/2009  ? Congenital pes planus 03/13/2008  ? Hypercholesterolemia without hypertriglyceridemia 09/16/2006  ? ? ?Conditions to be addressed/monitored: Depression; Mental Health Concerns  ? ?Care Plan : RN Care Management Plan of Care  ?Updates made by Ashley Claude, LCSW since 12/03/2021 12:00 AM  ?  ? ?Problem: CHL AMB "PATIENT-SPECIFIC PROBLEM"   ?Note:   ?CARE PLAN ENTRY ?(see longitudinal plan of care for additional care plan information) ? ?Current Barriers:  ?Knowledge deficits related to accessing mental health provider in patient with Depression  ?Patient is experiencing symptoms of  depression which seem to be excarerbated by feelings overwhelm in regards to her family dynamics ?Patient needs Support, Education, and Care Coordination in order to meet unmet mental health needs  ?Mental Health Concerns  ? ?Clinical Social Work Goal(s):  ?Over the next 90 days, patient will work with SW bi-weekly by telephone or in person to reduce or manage symptoms of depression until connected for ongoing counseling resources.  ?Patient will implement clinical  interventions discussed today to decreases symptoms of depression and increase knowledge and/or ability of: coping skills. ?Interventions:  ?Assessed patient's understanding, education, previous treatment and care coordination needs  ?Patient continues to discuss difficulty dealing with the death of her mother-discussed having a lack of motivation and symptoms of fatigue ?Patient has additional struggles with family and maintaining boundaries, specifically with decisions regarding her housing ?Positive coping strategies discussed and reinforced ?Continued to discuss importance of prioritizing tasks-breaking them down to smaller more manageable tasks ?Patient continues to be interested in group therapy-following resources provided for group therapy: [facility] [phone] and [facility] [phone] has not called but remains agreeable to contacting this agency for additional support ?Provided basic mental health support, education and interventions  ?Collaborated with appropriate clinical care team members regarding patient needs through provider co-signature ?Reviewed mental health medications with patient prescribed by PCP and discussed compliance  ?Other interventions include: Active listening / Reflection utilized  ?Emotional Support Provided  ? ?Patient Self Care Activities & Deficits:  ?Patient is unable to independently navigate community resource options without care coordination support ?Patient is able to implement clinical interventions discussed today and is motivated for treatment  ?Patient will select one of the agencies from the list  provided and call to schedule an appointment  ?Performs ADL's independently ?Performs IADL's independently ?Ability for insight ?Motivation for treatment ?Strong family or social support ? ?Please see past updates related to this goal by clicking on the "Past Updates" button in the selected goal  ? ?  ?  ? ?Follow Up  Plan: SW will follow up with patient by phone over the next 14 business days ?     ? ?Ashley Criswell, LCSW ?Clinical Social Worker  ?[facility] ?[phone] ? ? ? ?

## 2021-12-03 NOTE — Patient Instructions (Signed)
Visit Information ? ?Thank you for taking time to visit with me today. Please don't hesitate to contact me if I can be of assistance to you before our next scheduled telephone appointment. ? ?Following are the goals we discussed today:  ? ?- begin personal counseling ?- call and visit an old friend ?- join a support group ?- practice relaxation or meditation daily ?- talk about feelings with a friend, family or spiritual advisor ?- practice positive thinking and self-talk  ?-contact the following agencies for group therapy: [facility] [phone] [facility] [phone], [facility] [phone] ? ?Our next appointment is by telephone on 12/17/21 at 11am ? ?Please call the care guide team at [phone] if you need to cancel or reschedule your appointment.  ? ?If you are experiencing a Mental Health or [place] or need someone to talk to, please call the Suicide and Crisis Lifeline: 988  ? ?Patient verbalizes understanding of instructions and care plan provided today and agrees to view in [place]. Active MyChart status confirmed with patient.   ? ?Telephone follow up appointment with care management team member scheduled for: 12/17/21 ? ? ?Albirtha Grinage, LCSW ?Clinical Social Worker  ?[facility] ?[phone] ? ?

## 2021-12-03 NOTE — Telephone Encounter (Signed)
?  Care Management  ? ?Follow Up Note ? ? ?12/03/2021 ?Name: Ashley Mccullough MRN: [id number] DOB: 07-31-1961 ? ? ?Referred by: Gwyneth Sprout, FNP ?Reason for referral : Care Coordination ? ? ?An unsuccessful telephone outreach was attempted today. The patient was referred to the case management team for assistance with care management and care coordination.  ? ?Follow Up Plan: Telephone follow up appointment with care management team member to be re-scheduled by careguide: ? ? ?Tibor Lemmons, LCSW ?Clinical Social Worker  ?[facility] ?[phone] ? ? ?

## 2021-12-17 ENCOUNTER — Ambulatory Visit: Payer: 59 | Admitting: *Deleted

## 2021-12-17 DIAGNOSIS — I10 Essential (primary) hypertension: Secondary | ICD-10-CM

## 2021-12-17 DIAGNOSIS — F322 Major depressive disorder, single episode, severe without psychotic features: Secondary | ICD-10-CM

## 2021-12-17 NOTE — Patient Instructions (Signed)
Visit Information ? ?Thank you for taking time to visit with me today. Please don't hesitate to contact me if I can be of assistance to you before our next scheduled telephone appointment. ? ?Following are the goals we discussed today:  ? ?- begin personal counseling ?- call and visit an old friend ?- join a support group ?- practice relaxation or meditation daily ?- talk about feelings with a friend, family or spiritual advisor ?- practice positive thinking and self-talk  ?-contact the following agencies for group therapy: [facility] [phone] [facility] [phone], [facility] [phone] ? ?Our next appointment is by telephone on 12/31/21 at 1pm ? ?Please call the care guide team at [phone] if you need to cancel or reschedule your appointment.  ? ?If you are experiencing a Mental Health or [place] or need someone to talk to, please call the Suicide and Crisis Lifeline: 988  ? ?Patient verbalizes understanding of instructions and care plan provided today and agrees to view in [place]. Active MyChart status confirmed with patient.   ? ?Telephone follow up appointment with care management team member scheduled for: 12/31/21 ? ? ?Ruddy Swire, LCSW ?Clinical Social Worker  ?[facility] ?[phone] ? ?

## 2021-12-17 NOTE — Chronic Care Management (AMB) (Signed)
Care Management ?Clinical Social Work Note ? ?12/17/2021 ?Name: Ashley Mccullough MRN: [id number] DOB: 1961-05-15 ? ?Ashley Mccullough is a 61 y.o. year old female who is a primary care patient of Gwyneth Sprout, FNP.  The Care Management team was consulted for assistance with chronic disease management and coordination needs. ? ?Engaged with patient by telephone for follow up visit in response to provider referral for social work chronic care management and care coordination services ? ?Consent to Services:  ?[name] was given information about Care Management services today including:  ?Care Management services includes personalized support from designated clinical staff supervised by her physician, including individualized plan of care and coordination with other care providers ?24/7 contact phone numbers for assistance for urgent and routine care needs. ?The patient may stop case management services at any time by phone call to the office staff. ? ?Patient agreed to services and consent obtained.  ? ?Assessment: Review of patient past medical history, allergies, medications, and health status, including review of relevant consultants reports was performed today as part of a comprehensive evaluation and provision of chronic care management and care coordination services. ? ?SDOH (Social Determinants of Health) assessments and interventions performed:   ? ?Advanced Directives Status: Not addressed in this encounter. ? ?Care Plan ? ?Allergies  ?Allergen Reactions  ? Cashew Nut Oil Hives, Itching and Swelling  ? Latex Itching  ? Norvasc [Amlodipine] Other (See Comments)  ?  Dizziness, flushed  ? Simvastatin   ?  Other reaction(s): Unknown ?Memory changes ?Memory changes  ? Sulfa Antibiotics   ?  Other reaction(s): Unknown  ? Levofloxacin Rash  ? Prednisone Rash  ? ? ?Outpatient Encounter Medications as of 12/17/2021  ?Medication Sig Note  ? acetaminophen (TYLENOL) 650 MG CR tablet Take 650-1,300 mg by mouth at  bedtime. As needed for arthritis.   ? amLODipine (NORVASC) 5 MG tablet Take 1 tablet by mouth daily. (Patient not taking: Reported on 10/21/2021)   ? buPROPion (WELLBUTRIN XL) 150 MG 24 hr tablet Take 1 tablet by mouth daily.   ? Cetirizine HCl 10 MG CAPS Take by mouth. 01/19/2019: Pt takes 1 tablet daily.  ? Cholecalciferol (VITAMIN D3) 5000 units TABS Take 5,000 tablets by mouth daily.   ? COLLAGEN PO Take by mouth.   ? FLUoxetine (PROZAC) 20 MG capsule Take by mouth. 10/21/2021: Reports dose changed to 20 mg (three times a day) for total of 60mg   ? gabapentin (NEURONTIN) 300 MG capsule Take 2 capsules by mouth 3 (three) times daily. 10/21/2021: Reports taking twice a day  ? hydrOXYzine (ATARAX/VISTARIL) 25 MG tablet Take 1 tablet (25 mg total) by mouth every 4 (four) hours as needed for anxiety.   ? losartan-hydrochlorothiazide (HYZAAR) 100-25 MG tablet Take 1 tablet by mouth daily.   ? meloxicam (MOBIC) 15 MG tablet Take 1 tablet (15 mg total) by mouth daily. (Patient not taking: Reported on 10/17/2021)   ? metaxalone (SKELAXIN) 800 MG tablet Take 0.5-1 tablets (400-800 mg total) by mouth 3 (three) times daily as needed for muscle spasms. (Patient not taking: Reported on 10/21/2021)   ? metoprolol succinate (TOPROL-XL) 25 MG 24 hr tablet TAKE 1 TABLET(25 MG) BY MOUTH DAILY   ? montelukast (SINGULAIR) 10 MG tablet Take 1 tablet by mouth daily.   ? traMADol (ULTRAM) 50 MG tablet  10/21/2021: Reports taking very rarely if needed  ? traZODone (DESYREL) 100 MG tablet Take 1-2 tablets at night prior to sleep   ? ?No facility-administered  encounter medications on file as of 12/17/2021.  ? ? ?Patient Active Problem List  ? Diagnosis Date Noted  ? Annual physical exam 10/17/2021  ? Depression, major, single episode, severe ([place]) 10/17/2021  ? Chronic kidney disease (CKD) stage G4/A1, severely decreased glomerular filtration rate (GFR) between 15-29 mL/min/1.73 square meter and albuminuria creatinine ratio less than 30 mg/g (HCC)  10/17/2021  ? Primary hypertension 10/17/2021  ? CKD (chronic kidney disease) stage 4, GFR 15-29 ml/min (HCC) 06/06/2021  ? Hypersomnia 06/05/2021  ? Lumbar radiculitis 06/05/2021  ? Encounter for screening mammogram for malignant neoplasm of breast 06/05/2021  ? Chronic pain syndrome 04/11/2021  ? Bilateral primary osteoarthritis of knee 04/11/2021  ? Lumbar spondylosis 04/11/2021  ? Sacroiliac joint pain 04/11/2021  ? Chronic pain of both knees 06/21/2020  ? Chronic radicular lumbar pain 11/18/2019  ? Spinal stenosis of lumbar region with neurogenic claudication 11/18/2019  ? DDD (degenerative disc disease), lumbosacral 11/18/2019  ? Mild episode of recurrent major depressive disorder ([place]) 10/26/2018  ? BMI 60.0-69.9, adult ([place]) 07/01/2017  ? Morbid obesity due to excess calories ([place]) 12/24/2015  ? Allergic rhinitis 05/29/2015  ? Anxiety, generalized 05/29/2015  ? D (diarrhea) 05/29/2015  ? Avitaminosis D 05/29/2015  ? Prediabetes 05/29/2015  ? Fatigue 05/29/2015  ? Hypertension 03/15/2015  ? Cannot sleep 11/13/2009  ? Congenital pes planus 03/13/2008  ? Hypercholesterolemia without hypertriglyceridemia 09/16/2006  ? ? ?Conditions to be addressed/monitored: Depression; Mental Health Concerns  ? ?Care Plan : RN Care Management Plan of Care  ?Updates made by Vern Claude, LCSW since 12/17/2021 12:00 AM  ?  ? ?Problem: CHL AMB "PATIENT-SPECIFIC PROBLEM"   ?Note:   ?CARE PLAN ENTRY ?(see longitudinal plan of care for additional care plan information) ? ?Current Barriers:  ?Knowledge deficits related to accessing mental health provider in patient with Depression  ?Patient is experiencing symptoms of  depression which seem to be excarerbated by feelings overwhelm in regards to her family dynamics ?Patient needs Support, Education, and Care Coordination in order to meet unmet mental health needs  ?Mental Health Concerns  ? ?Clinical Social Work Goal(s):  ?Over the next 90 days, patient will work with SW bi-weekly by  telephone or in person to reduce or manage symptoms of depression until connected for ongoing counseling resources.  ?Patient will implement clinical interventions discussed today to decreases symptoms of depression and increase knowledge and/or ability of: coping skills. ?Interventions:  ?Assessed patient's understanding, education, previous treatment and care coordination needs  ?Patient continues to discuss difficulty dealing with the death of her mother-discussed continued having a lack of motivation and fatigue now having difficulty leaving her home ?Patient confirmed having a strong network of support-frequent calls from family expressing concern and support ?Confirmed that the "love for her family" motivates her to continue to move forward ?Discussion continues regarding move to an accessible apartment and spending time with family for the Easter holiday ?Positive coping strategies discussed and reinforced ?Continued to discuss importance of prioritizing tasks-breaking them down to smaller more manageable tasks ?Patient continues to be interested in group therapy-following resources provided for group therapy: [facility] [phone] and [facility] [phone] has not called but remains agreeable to contacting this agency for additional support ?Provided basic mental health support, education and interventions  ?Collaborated with appropriate clinical care team members regarding patient needs through provider co-signature ?Reviewed mental health medications with patient prescribed by PCP and discussed compliance  ?Other interventions include: Active listening / Reflection  utilized  ?Emotional Support Provided Positive reinforcement provided for plans to move and spend more time with family ? ?Patient Self Care Activities & Deficits:  ?Patient is unable to independently navigate community resource options without care coordination support ?Patient is able to  implement clinical interventions discussed today and is motivated for treatment  ?Patient will select one of the agencies from the list provided and call to schedule an appointment  ?Performs ADL's independent

## 2021-12-27 ENCOUNTER — Other Ambulatory Visit: Payer: Self-pay | Admitting: Family Medicine

## 2021-12-27 DIAGNOSIS — F5101 Primary insomnia: Secondary | ICD-10-CM

## 2021-12-30 MED ORDER — BUPROPION HCL ER (XL) 150 MG PO TB24: 150.0000 mg | ORAL_TABLET | Freq: Every day | ORAL | 0 refills | Status: DC

## 2021-12-30 MED ORDER — FLUOXETINE HCL 20 MG PO CAPS: 20.0000 mg | ORAL_CAPSULE | Freq: Every day | ORAL | 0 refills | Status: DC

## 2021-12-30 MED ORDER — TRAZODONE HCL 100 MG PO TABS: ORAL_TABLET | ORAL | 0 refills | Status: DC

## 2021-12-30 NOTE — Telephone Encounter (Signed)
Patient comment: This should go to [facility], [address].  I am no longer at [facility]. ?

## 2021-12-31 ENCOUNTER — Ambulatory Visit: Payer: 59 | Admitting: *Deleted

## 2021-12-31 DIAGNOSIS — F322 Major depressive disorder, single episode, severe without psychotic features: Secondary | ICD-10-CM

## 2021-12-31 DIAGNOSIS — I10 Essential (primary) hypertension: Secondary | ICD-10-CM

## 2021-12-31 NOTE — Patient Instructions (Signed)
Visit Information ? ?Thank you for taking time to visit with me today. Please don't hesitate to contact me if I can be of assistance to you before our next scheduled telephone appointment. ? ?Following are the goals we discussed today:  ? ?- begin personal counseling ?- call and visit an old friend ?- join a support group ?- practice relaxation or meditation daily ?- talk about feelings with a friend, family or spiritual advisor ?- practice positive thinking and self-talk  ?-contact the following agencies for group therapy: [facility] [phone] [facility] [phone], [facility] [phone] ? ?Our next appointment is by telephone on 01/14/22 at 1pm ? ?Please call the care guide team at [phone] if you need to cancel or reschedule your appointment.  ? ?If you are experiencing a Mental Health or [place] or need someone to talk to, please call the Suicide and Crisis Lifeline: 988  ? ?Patient verbalizes understanding of instructions and care plan provided today and agrees to view in [place]. Active MyChart status confirmed with patient.   ? ?Telephone follow up appointment with care management team member scheduled for: 01/14/22 ? ? ?Shyler Holzman, LCSW ?Clinical Social Worker  ?[facility] ?[phone] ? ?

## 2021-12-31 NOTE — Chronic Care Management (AMB) (Signed)
Care Management ?Clinical Social Work Note ? ?12/31/2021 ?Name: Ashley Mccullough MRN: [id number] DOB: 1961/02/11 ? ?Ashley Mccullough is a 61 y.o. year old female who is a primary care patient of Ashley Sprout, FNP.  The Care Management team was consulted for assistance with chronic disease management and coordination needs. ? ?Engaged with patient by telephone for follow up visit in response to provider referral for social work chronic care management and care coordination services ? ?Consent to Services:  ?Ashley Mccullough was given information about Care Management services today including:  ?Care Management services includes personalized support from designated clinical staff supervised by her physician, including individualized plan of care and coordination with other care providers ?24/7 contact phone numbers for assistance for urgent and routine care needs. ?The patient may stop case management services at any time by phone call to the office staff. ? ?Patient agreed to services and consent obtained.  ? ?Assessment: Review of patient past medical history, allergies, medications, and health status, including review of relevant consultants reports was performed today as part of a comprehensive evaluation and provision of chronic care management and care coordination services. ? ?SDOH (Social Determinants of Health) assessments and interventions performed:   ? ?Advanced Directives Status: Not addressed in this encounter. ? ?Care Plan ? ?Allergies  ?Allergen Reactions  ? Cashew Nut Oil Hives, Itching and Swelling  ? Latex Itching  ? Norvasc [Amlodipine] Other (See Comments)  ?  Dizziness, flushed  ? Simvastatin   ?  Other reaction(s): Unknown ?Memory changes ?Memory changes  ? Sulfa Antibiotics   ?  Other reaction(s): Unknown  ? Levofloxacin Rash  ? Prednisone Rash  ? ? ?Outpatient Encounter Medications as of 12/31/2021  ?Medication Sig Note  ? buPROPion (WELLBUTRIN XL) 150 MG 24 hr tablet Take 1 tablet (150 mg  total) by mouth daily.   ? FLUoxetine (PROZAC) 20 MG capsule Take 1 capsule (20 mg total) by mouth daily.   ? traZODone (DESYREL) 100 MG tablet Take 1-2 tablets at night prior to sleep   ? acetaminophen (TYLENOL) 650 MG CR tablet Take 650-1,300 mg by mouth at bedtime. As needed for arthritis.   ? amLODipine (NORVASC) 5 MG tablet Take 1 tablet by mouth daily. (Patient not taking: Reported on 10/21/2021)   ? Cetirizine HCl 10 MG CAPS Take by mouth. 01/19/2019: Pt takes 1 tablet daily.  ? Cholecalciferol (VITAMIN D3) 5000 units TABS Take 5,000 tablets by mouth daily.   ? COLLAGEN PO Take by mouth.   ? gabapentin (NEURONTIN) 300 MG capsule Take 2 capsules by mouth 3 (three) times daily. 10/21/2021: Reports taking twice a day  ? hydrOXYzine (ATARAX/VISTARIL) 25 MG tablet Take 1 tablet (25 mg total) by mouth every 4 (four) hours as needed for anxiety.   ? losartan-hydrochlorothiazide (HYZAAR) 100-25 MG tablet Take 1 tablet by mouth daily.   ? meloxicam (MOBIC) 15 MG tablet Take 1 tablet (15 mg total) by mouth daily. (Patient not taking: Reported on 10/17/2021)   ? metaxalone (SKELAXIN) 800 MG tablet Take 0.5-1 tablets (400-800 mg total) by mouth 3 (three) times daily as needed for muscle spasms. (Patient not taking: Reported on 10/21/2021)   ? metoprolol succinate (TOPROL-XL) 25 MG 24 hr tablet TAKE 1 TABLET(25 MG) BY MOUTH DAILY   ? montelukast (SINGULAIR) 10 MG tablet Take 1 tablet by mouth daily.   ? traMADol (ULTRAM) 50 MG tablet  10/21/2021: Reports taking very rarely if needed  ? ?No facility-administered encounter medications on file as  of 12/31/2021.  ? ? ?Patient Active Problem List  ? Diagnosis Date Noted  ? Annual physical exam 10/17/2021  ? Depression, major, single episode, severe ([place]) 10/17/2021  ? Chronic kidney disease (CKD) stage G4/A1, severely decreased glomerular filtration rate (GFR) between 15-29 mL/min/1.73 square meter and albuminuria creatinine ratio less than 30 mg/g (HCC) 10/17/2021  ? Primary hypertension  10/17/2021  ? CKD (chronic kidney disease) stage 4, GFR 15-29 ml/min (HCC) 06/06/2021  ? Hypersomnia 06/05/2021  ? Lumbar radiculitis 06/05/2021  ? Encounter for screening mammogram for malignant neoplasm of breast 06/05/2021  ? Chronic pain syndrome 04/11/2021  ? Bilateral primary osteoarthritis of knee 04/11/2021  ? Lumbar spondylosis 04/11/2021  ? Sacroiliac joint pain 04/11/2021  ? Chronic pain of both knees 06/21/2020  ? Chronic radicular lumbar pain 11/18/2019  ? Spinal stenosis of lumbar region with neurogenic claudication 11/18/2019  ? DDD (degenerative disc disease), lumbosacral 11/18/2019  ? Mild episode of recurrent major depressive disorder ([place]) 10/26/2018  ? BMI 60.0-69.9, adult ([place]) 07/01/2017  ? Morbid obesity due to excess calories ([place]) 12/24/2015  ? Allergic rhinitis 05/29/2015  ? Anxiety, generalized 05/29/2015  ? D (diarrhea) 05/29/2015  ? Avitaminosis D 05/29/2015  ? Prediabetes 05/29/2015  ? Fatigue 05/29/2015  ? Hypertension 03/15/2015  ? Cannot sleep 11/13/2009  ? Congenital pes planus 03/13/2008  ? Hypercholesterolemia without hypertriglyceridemia 09/16/2006  ? ? ?Conditions to be addressed/monitored: Depression; Mental Health Concerns  ? ?Care Plan : RN Care Management Plan of Care  ?Updates made by Vern Claude, LCSW since 12/31/2021 12:00 AM  ?  ? ?Problem: CHL AMB "PATIENT-SPECIFIC PROBLEM"   ?Note:   ?CARE PLAN ENTRY ?(see longitudinal plan of care for additional care plan information) ? ?Current Barriers:  ?Knowledge deficits related to accessing mental health provider in patient with Depression  ?Patient is experiencing symptoms of  depression which seem to be excarerbated by feelings overwhelm in regards to her family dynamics ?Patient needs Support, Education, and Care Coordination in order to meet unmet mental health needs  ?Mental Health Concerns  ? ?Clinical Social Work Goal(s):  ?Over the next 90 days, patient will work with SW bi-weekly by telephone or in person to reduce  or manage symptoms of depression until connected for ongoing counseling resources.  ?Patient will implement clinical interventions discussed today to decreases symptoms of depression and increase knowledge and/or ability of: coping skills. ?Interventions:  ?Assessed patient's understanding, education, previous treatment and care coordination needs  ?Patient continues to discuss difficulty dealing with the death of her mother-discussed feeling that she is making progress in dealing with her grief-starting to become more active. ?Patient confirmed having a strong network of support-frequent calls and visits from family expressing concern and support ?Discussion continues regarding move to an accessible apartment and spending time with family for the Easter holiday-visit went well, patient states that she has submitted an application for a new apartment with a elevator ?Positive coping strategies continue to be discussed and reinforced ?Continued to discuss importance of prioritizing tasks-breaking them down to smaller more manageable tasks ?Patient continues to be interested in group therapy-following resources provided for group therapy: [facility] [phone] and [facility] [phone] has not called but remains agreeable to contacting this agency for additional support ?Provided basic mental health support, education and interventions  ?Collaborated with appropriate clinical care team members regarding patient needs through provider co-signature ?Reviewed mental health medications with patient prescribed by PCP and discussed compliance  ?Other interventions include:  Active listening / Reflection utilized  ?Emotional Support Provided Positive reinforcement provided for gains made related to increase in activity and plans to move into an accessible apartment ? ?Patient Self Care Activities & Deficits:  ?Patient is unable to independently navigate community resource  options without care coordination support ?Patient is able to implement clinical interventions discussed today and is motivated for treatment  ?Patient will select one of the agencies from the list prov

## 2022-01-14 ENCOUNTER — Telehealth: Payer: 59

## 2022-01-16 ENCOUNTER — Ambulatory Visit: Payer: 59 | Admitting: *Deleted

## 2022-01-16 DIAGNOSIS — I10 Essential (primary) hypertension: Secondary | ICD-10-CM

## 2022-01-16 DIAGNOSIS — F322 Major depressive disorder, single episode, severe without psychotic features: Secondary | ICD-10-CM

## 2022-01-16 NOTE — Patient Instructions (Signed)
Visit Information ? ?Thank you for taking time to visit with me today. Please don't hesitate to contact me if I can be of assistance to you before our next scheduled telephone appointment. ? ?Following are the goals we discussed today:  ? ?- begin personal counseling ?- call and visit an old friend ?- join a support group ?- practice relaxation or meditation daily ?- talk about feelings with a friend, family or spiritual advisor ?- practice positive thinking and self-talk  ?-contact the following agencies for group therapy: [facility] [phone] [facility] [phone], [facility] [phone] ? ?Our next appointment is by telephone on 01/30/22 at 1pm ? ?Please call the care guide team at [phone] if you need to cancel or reschedule your appointment.  ? ?If you are experiencing a Mental Health or [place] or need someone to talk to, please call the Suicide and Crisis Lifeline: 988  ? ?Patient verbalizes understanding of instructions and care plan provided today and agrees to view in [place]. Active MyChart status confirmed with patient.   ? ?Telephone follow up appointment with care management team member scheduled for: 01/30/22 ? ? ? ?[employer], LCSW ?Clinical Social Worker  ?[facility] ?[phone] ? ?

## 2022-01-16 NOTE — Chronic Care Management (AMB) (Signed)
Care Management ?Clinical Social Work Note ? ?01/16/2022 ?Name: Dionicia Cerritos MRN: [id number] DOB: Dec 17, 1960 ? ?Mariann Palo is a 61 y.o. year old female who is a primary care patient of Gwyneth Sprout, FNP.  The Care Management team was consulted for assistance with chronic disease management and coordination needs. ? ?Engaged with patient by telephone for follow up visit in response to provider referral for social work chronic care management and care coordination services ? ?Consent to Services:  ?Ms. Taff was given information about Care Management services today including:  ?Care Management services includes personalized support from designated clinical staff supervised by her physician, including individualized plan of care and coordination with other care providers ?24/7 contact phone numbers for assistance for urgent and routine care needs. ?The patient may stop case management services at any time by phone call to the office staff. ? ?Patient agreed to services and consent obtained.  ? ?Assessment: Review of patient past medical history, allergies, medications, and health status, including review of relevant consultants reports was performed today as part of a comprehensive evaluation and provision of chronic care management and care coordination services. ? ?SDOH (Social Determinants of Health) assessments and interventions performed:   ? ?Advanced Directives Status: Not ready or willing to discuss. ? ?Care Plan ? ?Allergies  ?Allergen Reactions  ? Cashew Nut Oil Hives, Itching and Swelling  ? Latex Itching  ? Norvasc [Amlodipine] Other (See Comments)  ?  Dizziness, flushed  ? Simvastatin   ?  Other reaction(s): Unknown ?Memory changes ?Memory changes  ? Sulfa Antibiotics   ?  Other reaction(s): Unknown  ? Levofloxacin Rash  ? Prednisone Rash  ? ? ?Outpatient Encounter Medications as of 01/16/2022  ?Medication Sig Note  ? buPROPion (WELLBUTRIN XL) 150 MG 24 hr tablet Take 1 tablet (150 mg  total) by mouth daily.   ? FLUoxetine (PROZAC) 20 MG capsule Take 1 capsule (20 mg total) by mouth daily.   ? traZODone (DESYREL) 100 MG tablet Take 1-2 tablets at night prior to sleep   ? acetaminophen (TYLENOL) 650 MG CR tablet Take 650-1,300 mg by mouth at bedtime. As needed for arthritis.   ? amLODipine (NORVASC) 5 MG tablet Take 1 tablet by mouth daily. (Patient not taking: Reported on 10/21/2021)   ? Cetirizine HCl 10 MG CAPS Take by mouth. 01/19/2019: Pt takes 1 tablet daily.  ? Cholecalciferol (VITAMIN D3) 5000 units TABS Take 5,000 tablets by mouth daily.   ? COLLAGEN PO Take by mouth.   ? gabapentin (NEURONTIN) 300 MG capsule Take 2 capsules by mouth 3 (three) times daily. 10/21/2021: Reports taking twice a day  ? hydrOXYzine (ATARAX/VISTARIL) 25 MG tablet Take 1 tablet (25 mg total) by mouth every 4 (four) hours as needed for anxiety.   ? losartan-hydrochlorothiazide (HYZAAR) 100-25 MG tablet Take 1 tablet by mouth daily.   ? meloxicam (MOBIC) 15 MG tablet Take 1 tablet (15 mg total) by mouth daily. (Patient not taking: Reported on 10/17/2021)   ? metaxalone (SKELAXIN) 800 MG tablet Take 0.5-1 tablets (400-800 mg total) by mouth 3 (three) times daily as needed for muscle spasms. (Patient not taking: Reported on 10/21/2021)   ? metoprolol succinate (TOPROL-XL) 25 MG 24 hr tablet TAKE 1 TABLET(25 MG) BY MOUTH DAILY   ? montelukast (SINGULAIR) 10 MG tablet Take 1 tablet by mouth daily.   ? traMADol (ULTRAM) 50 MG tablet  10/21/2021: Reports taking very rarely if needed  ? ?No facility-administered encounter medications on file  as of 01/16/2022.  ? ? ?Patient Active Problem List  ? Diagnosis Date Noted  ? Annual physical exam 10/17/2021  ? Depression, major, single episode, severe ([place]) 10/17/2021  ? Chronic kidney disease (CKD) stage G4/A1, severely decreased glomerular filtration rate (GFR) between 15-29 mL/min/1.73 square meter and albuminuria creatinine ratio less than 30 mg/g ([facility]) 10/17/2021  ? Primary hypertension  10/17/2021  ? CKD (chronic kidney disease) stage 4, GFR 15-29 ml/min ([facility]) 06/06/2021  ? Hypersomnia 06/05/2021  ? Lumbar radiculitis 06/05/2021  ? Encounter for screening mammogram for malignant neoplasm of breast 06/05/2021  ? Chronic pain syndrome 04/11/2021  ? Bilateral primary osteoarthritis of knee 04/11/2021  ? Lumbar spondylosis 04/11/2021  ? Sacroiliac joint pain 04/11/2021  ? Chronic pain of both knees 06/21/2020  ? Chronic radicular lumbar pain 11/18/2019  ? Spinal stenosis of lumbar region with neurogenic claudication 11/18/2019  ? DDD (degenerative disc disease), lumbosacral 11/18/2019  ? Mild episode of recurrent major depressive disorder ([place]) 10/26/2018  ? BMI 60.0-69.9, adult ([place]) 07/01/2017  ? Morbid obesity due to excess calories ([place]) 12/24/2015  ? Allergic rhinitis 05/29/2015  ? Anxiety, generalized 05/29/2015  ? D (diarrhea) 05/29/2015  ? Avitaminosis D 05/29/2015  ? Prediabetes 05/29/2015  ? Fatigue 05/29/2015  ? Hypertension 03/15/2015  ? Cannot sleep 11/13/2009  ? Congenital pes planus 03/13/2008  ? Hypercholesterolemia without hypertriglyceridemia 09/16/2006  ? ? ?Conditions to be addressed/monitored: Depression; Mental Health Concerns  ? ?Care Plan : RN Care Management Plan of Care  ?Updates made by Vern Claude, LCSW since 01/16/2022 12:00 AM  ?  ? ?Problem: CHL AMB "PATIENT-SPECIFIC PROBLEM"   ?Note:   ?CARE PLAN ENTRY ?(see longitudinal plan of care for additional care plan information) ? ?Current Barriers:  ?Knowledge deficits related to accessing mental health provider in patient with Depression  ?Patient is experiencing symptoms of  depression which seem to be excarerbated by feelings overwhelm in regards to her family dynamics ?Patient needs Support, Education, and Care Coordination in order to meet unmet mental health needs  ?Mental Health Concerns  ? ?Clinical Social Work Goal(s):  ?Over the next 90 days, patient will work with SW bi-weekly by telephone or in person to reduce  or manage symptoms of depression until connected for ongoing counseling resources.  ?Patient will implement clinical interventions discussed today to decreases symptoms of depression and increase knowledge and/or ability of: coping skills. ?Interventions:  ?Assessed patient's understanding, education, previous treatment and care coordination needs  ?Patient continues to consider options for alternative living, apartment previously applied for was out of her price range ?Patient needing an apartment on the top floor with an elevator, working on weight loss to assist with navigating the stairs in her current apartment until she is able to move ?Patient continues to discuss difficulty dealing with the death of her mother-discussed feeling that she is making progress in dealing with her grief-starting to become more active but still struggles  ?Patient confirmed having a strong network of support-frequent calls and visits from family expressing concern and support ?Positive coping strategies continue to be discussed and reinforced ?Continued to discuss importance of prioritizing tasks, finding her purpose, setting clear boundaries with family and making efforts towards being her authentic self ?Patient continues to be interested in group therapy-following resources provided for group therapy: [facility] [phone] and [facility] [phone] has not called but remains agreeable to contacting this agency for additional support-encouragement provided to follow through on group/individual counseling ?Provided basic  mental health support, education and interventions  ?Collaborated with appropriate clinical care team members regarding patient needs through provider co-signature ?Reviewed mental health medications with patient prescribed by PCP and discussed compliance  ?Other interventions include: Active listening / Reflection utilized  ?Emotional Support Provided  Positive reinforcement provided for gains made related to increase in activity and plans to move into an accessible apartment ? ?Patient Self Care Activities & Deficits:  ?Patient is unable to independently navigat

## 2022-01-17 ENCOUNTER — Other Ambulatory Visit: Payer: Self-pay | Admitting: Family Medicine

## 2022-01-21 ENCOUNTER — Other Ambulatory Visit: Payer: Self-pay | Admitting: Family Medicine

## 2022-01-21 MED ORDER — FLUOXETINE HCL 20 MG PO CAPS: 20.0000 mg | ORAL_CAPSULE | Freq: Every day | ORAL | 1 refills | Status: DC

## 2022-01-21 MED ORDER — FLUOXETINE HCL 40 MG PO CAPS: 40.0000 mg | ORAL_CAPSULE | Freq: Every day | ORAL | 1 refills | Status: DC

## 2022-01-30 ENCOUNTER — Ambulatory Visit: Payer: 59 | Admitting: *Deleted

## 2022-01-30 DIAGNOSIS — I10 Essential (primary) hypertension: Secondary | ICD-10-CM

## 2022-01-30 DIAGNOSIS — F322 Major depressive disorder, single episode, severe without psychotic features: Secondary | ICD-10-CM

## 2022-01-30 NOTE — Patient Instructions (Signed)
Visit Information  Thank you for taking time to visit with me today. Please don't hesitate to contact me if I can be of assistance to you before our next scheduled telephone appointment.  Following are the goals we discussed today:   - begin personal counseling -practice self care - practice relaxation or meditation daily - practice positive thinking and self-talk  -contact the following agencies for group therapy: [facility] [phone], [facility] [phone]  Our next appointment is by telephone on 02/13/22 at 1pm  Please call the care guide team at [phone] if you need to cancel or reschedule your appointment.   If you are experiencing a Mental Health or [place] or need someone to talk to, please call the Suicide and Crisis Lifeline: 988   Patient verbalizes understanding of instructions and care plan provided today and agrees to view in [place]. Active MyChart status and patient understanding of how to access instructions and care plan via MyChart confirmed with patient.     Telephone follow up appointment with care management team member scheduled for: 02/13/22   Elliot Gurney, [place] Worker  [facility] Care [facility] [phone]

## 2022-01-30 NOTE — Chronic Care Management (AMB) (Signed)
Care Management Clinical Social Work Note  01/30/2022 Name: Ashley Mccullough MRN: [id number] DOB: 1961/05/17  Ashley Mccullough is a 61 y.o. year old female who is a primary care patient of Gwyneth Sprout, FNP.  The Care Management team was consulted for assistance with chronic disease management and coordination needs.  Engaged with patient by telephone for follow up visit in response to provider referral for social work chronic care management and care coordination services  Consent to Services:  Ashley Mccullough was given information about Care Management services today including:  Care Management services includes personalized support from designated clinical staff supervised by her physician, including individualized plan of care and coordination with other care providers 24/7 contact phone numbers for assistance for urgent and routine care needs. The patient may stop case management services at any time by phone call to the office staff.  Patient agreed to services and consent obtained.   Assessment: Review of patient past medical history, allergies, medications, and health status, including review of relevant consultants reports was performed today as part of a comprehensive evaluation and provision of chronic care management and care coordination services.  SDOH (Social Determinants of Health) assessments and interventions performed:    Advanced Directives Status: Not addressed in this encounter.  Care Plan  Allergies  Allergen Reactions   Cashew Nut Oil Hives, Itching and Swelling   Latex Itching   Norvasc [Amlodipine] Other (See Comments)    Dizziness, flushed   Simvastatin     Other reaction(s): Unknown Memory changes Memory changes   Sulfa Antibiotics     Other reaction(s): Unknown   Levofloxacin Rash   Prednisone Rash    Outpatient Encounter Medications as of 01/30/2022  Medication Sig Note   buPROPion (WELLBUTRIN XL) 150 MG 24 hr tablet Take 1 tablet (150 mg  total) by mouth daily.    traZODone (DESYREL) 100 MG tablet Take 1-2 tablets at night prior to sleep    acetaminophen (TYLENOL) 650 MG CR tablet Take 650-1,300 mg by mouth at bedtime. As needed for arthritis.    amLODipine (NORVASC) 5 MG tablet Take 1 tablet by mouth daily. (Patient not taking: Reported on 10/21/2021)    Cetirizine HCl 10 MG CAPS Take by mouth. 01/19/2019: Pt takes 1 tablet daily.   Cholecalciferol (VITAMIN D3) 5000 units TABS Take 5,000 tablets by mouth daily.    COLLAGEN PO Take by mouth.    FLUoxetine (PROZAC) 20 MG capsule Take 1 capsule (20 mg total) by mouth daily. Take once daily with 40 mg dose for total of 60 mg.    FLUoxetine (PROZAC) 40 MG capsule Take 1 capsule (40 mg total) by mouth daily. Take once daily with 20 mg dose for total of 60 mg.    gabapentin (NEURONTIN) 300 MG capsule Take 2 capsules by mouth 3 (three) times daily. 10/21/2021: Reports taking twice a day   hydrOXYzine (ATARAX/VISTARIL) 25 MG tablet Take 1 tablet (25 mg total) by mouth every 4 (four) hours as needed for anxiety.    losartan-hydrochlorothiazide (HYZAAR) 100-25 MG tablet Take 1 tablet by mouth daily.    meloxicam (MOBIC) 15 MG tablet Take 1 tablet (15 mg total) by mouth daily. (Patient not taking: Reported on 10/17/2021)    metaxalone (SKELAXIN) 800 MG tablet Take 0.5-1 tablets (400-800 mg total) by mouth 3 (three) times daily as needed for muscle spasms. (Patient not taking: Reported on 10/21/2021)    metoprolol succinate (TOPROL-XL) 25 MG 24 hr tablet TAKE 1 TABLET(25 MG) BY  MOUTH DAILY    montelukast (SINGULAIR) 10 MG tablet Take 1 tablet by mouth daily.    traMADol (ULTRAM) 50 MG tablet  10/21/2021: Reports taking very rarely if needed   No facility-administered encounter medications on file as of 01/30/2022.    Patient Active Problem List   Diagnosis Date Noted   Annual physical exam 10/17/2021   Depression, major, single episode, severe ([place]) 10/17/2021   Chronic kidney disease (CKD) stage  G4/A1, severely decreased glomerular filtration rate (GFR) between 15-29 mL/min/1.73 square meter and albuminuria creatinine ratio less than 30 mg/g ([place]) 10/17/2021   Primary hypertension 10/17/2021   CKD (chronic kidney disease) stage 4, GFR 15-29 ml/min ([place]) 06/06/2021   Hypersomnia 06/05/2021   Lumbar radiculitis 06/05/2021   Encounter for screening mammogram for malignant neoplasm of breast 06/05/2021   Chronic pain syndrome 04/11/2021   Bilateral primary osteoarthritis of knee 04/11/2021   Lumbar spondylosis 04/11/2021   Sacroiliac joint pain 04/11/2021   Chronic pain of both knees 06/21/2020   Chronic radicular lumbar pain 11/18/2019   Spinal stenosis of lumbar region with neurogenic claudication 11/18/2019   DDD (degenerative disc disease), lumbosacral 11/18/2019   Mild episode of recurrent major depressive disorder ([place]) 10/26/2018   BMI 60.0-69.9, adult ([place]) 07/01/2017   Morbid obesity due to excess calories ([place]) 12/24/2015   Allergic rhinitis 05/29/2015   Anxiety, generalized 05/29/2015   D (diarrhea) 05/29/2015   Avitaminosis D 05/29/2015   Prediabetes 05/29/2015   Fatigue 05/29/2015   Hypertension 03/15/2015   Cannot sleep 11/13/2009   Congenital pes planus 03/13/2008   Hypercholesterolemia without hypertriglyceridemia 09/16/2006    Conditions to be addressed/monitored: Depression; Mental Health Concerns   Care Plan : RN Care Management Plan of Care  Updates made by Ashley Claude, LCSW since 01/30/2022 12:00 AM     Problem: CHL AMB "PATIENT-SPECIFIC PROBLEM"   Note:   CARE PLAN ENTRY (see longitudinal plan of care for additional care plan information)  Current Barriers:  Knowledge deficits related to accessing mental health provider in patient with Depression  Patient is experiencing symptoms of  depression which seem to be excarerbated by feelings overwhelm in regards to her family dynamics Patient needs Support, Education, and Care Coordination in order  to meet unmet mental health needs  Mental Health Concerns   Clinical Social Work Goal(s):  Over the next 90 days, patient will work with SW bi-weekly by telephone or in person to reduce or manage symptoms of depression until connected for ongoing counseling resources.  Patient will implement clinical interventions discussed today to decreases symptoms of depression and increase knowledge and/or ability of: coping skills. Interventions:  Continue to assess patient's understanding, education, previous treatment and care coordination needs  Explored patient's feelings of grief and loss that resurfaced on Mother's Day -explored ways to honor her mother's memory ie journaling, memory box Patient discussed increased depressive symptoms, decrease in motivation and finding purpose in her life Patient continues to consider options for alternative living, continuing the search with her aunts help for a one story apartment or one with a elevator Patient continues to have confirm having a strong network of support-frequent calls and visits from family expressing concern and support Positive coping strategies continue to be discussed and reinforced Continued to discuss importance of prioritizing tasks, finding her purpose, setting clear boundaries with family and practicing self care Confirmed plan to focus on hobbies that she is enjoys-painting, making jewelry, coloring Patient continues to be interested in group therapy but has not  called-following resources provided for group therapy: [facility] [phone] and [facility] [phone] has not called but remains agreeable to contacting this agency for additional support-encouragement provided to follow through on group/individual counseling Provided basic mental health support, education and interventions  Collaborated with appropriate clinical care team members regarding patient needs through provider  co-signature Reviewed mental health medications with patient prescribed by PCP and discussed compliance  Other interventions include: Active listening / Reflection utilized  Emotional Support Provided   Patient Self Care Activities & Deficits:  Patient is unable to independently navigate community resource options without care coordination support Patient is able to implement clinical interventions discussed today and is motivated for treatment  Patient will select one of the agencies from the list provided and call to schedule an appointment  Performs ADL's independently Performs IADL's independently Ability for insight Motivation for treatment Strong family or social support  Please see past updates related to this goal by clicking on the "Past Updates" button in the selected goal        Follow Up Plan: SW will follow up with patient by phone over the next 14 business days   [employer], [place] Worker  [place] [facility] Management [phone]

## 2022-02-04 ENCOUNTER — Telehealth: Payer: 59

## 2022-02-13 ENCOUNTER — Ambulatory Visit: Payer: 59 | Admitting: *Deleted

## 2022-02-13 DIAGNOSIS — F322 Major depressive disorder, single episode, severe without psychotic features: Secondary | ICD-10-CM

## 2022-02-13 DIAGNOSIS — I10 Essential (primary) hypertension: Secondary | ICD-10-CM

## 2022-02-13 NOTE — Chronic Care Management (AMB) (Signed)
Care Management Clinical Social Work Note  02/13/2022 Name: Ashley Mccullough MRN: [id number] DOB: May 22, 1961  Ashley Mccullough is a 61 y.o. year old female who is a primary care patient of Gwyneth Sprout, FNP.  The Care Management team was consulted for assistance with chronic disease management and coordination needs.  Engaged with patient by telephone for follow up visit in response to provider referral for social work chronic care management and care coordination services  Consent to Services:  Ashley Mccullough was given information about Care Management services today including:  Care Management services includes personalized support from designated clinical staff supervised by her physician, including individualized plan of care and coordination with other care providers 24/7 contact phone numbers for assistance for urgent and routine care needs. The patient may stop case management services at any time by phone call to the office staff.  Patient agreed to services and consent obtained.   Assessment: Review of patient past medical history, allergies, medications, and health status, including review of relevant consultants reports was performed today as part of a comprehensive evaluation and provision of chronic care management and care coordination services.  SDOH (Social Determinants of Health) assessments and interventions performed:    Advanced Directives Status: Not addressed in this encounter.  Care Plan  Allergies  Allergen Reactions   Cashew Nut Oil Hives, Itching and Swelling   Latex Itching   Norvasc [Amlodipine] Other (See Comments)    Dizziness, flushed   Simvastatin     Other reaction(s): Unknown Memory changes Memory changes   Sulfa Antibiotics     Other reaction(s): Unknown   Levofloxacin Rash   Prednisone Rash    Outpatient Encounter Medications as of 02/13/2022  Medication Sig Note   buPROPion (WELLBUTRIN XL) 150 MG 24 hr tablet Take 1 tablet (150 mg  total) by mouth daily.    traZODone (DESYREL) 100 MG tablet Take 1-2 tablets at night prior to sleep    acetaminophen (TYLENOL) 650 MG CR tablet Take 650-1,300 mg by mouth at bedtime. As needed for arthritis.    amLODipine (NORVASC) 5 MG tablet Take 1 tablet by mouth daily. (Patient not taking: Reported on 10/21/2021)    Cetirizine HCl 10 MG CAPS Take by mouth. 01/19/2019: Pt takes 1 tablet daily.   Cholecalciferol (VITAMIN D3) 5000 units TABS Take 5,000 tablets by mouth daily.    COLLAGEN PO Take by mouth.    FLUoxetine (PROZAC) 20 MG capsule Take 1 capsule (20 mg total) by mouth daily. Take once daily with 40 mg dose for total of 60 mg.    FLUoxetine (PROZAC) 40 MG capsule Take 1 capsule (40 mg total) by mouth daily. Take once daily with 20 mg dose for total of 60 mg.    gabapentin (NEURONTIN) 300 MG capsule Take 2 capsules by mouth 3 (three) times daily. 10/21/2021: Reports taking twice a day   hydrOXYzine (ATARAX/VISTARIL) 25 MG tablet Take 1 tablet (25 mg total) by mouth every 4 (four) hours as needed for anxiety.    losartan-hydrochlorothiazide (HYZAAR) 100-25 MG tablet Take 1 tablet by mouth daily.    meloxicam (MOBIC) 15 MG tablet Take 1 tablet (15 mg total) by mouth daily. (Patient not taking: Reported on 10/17/2021)    metaxalone (SKELAXIN) 800 MG tablet Take 0.5-1 tablets (400-800 mg total) by mouth 3 (three) times daily as needed for muscle spasms. (Patient not taking: Reported on 10/21/2021)    metoprolol succinate (TOPROL-XL) 25 MG 24 hr tablet TAKE 1 TABLET(25 MG) BY  MOUTH DAILY    montelukast (SINGULAIR) 10 MG tablet Take 1 tablet by mouth daily.    traMADol (ULTRAM) 50 MG tablet  10/21/2021: Reports taking very rarely if needed   No facility-administered encounter medications on file as of 02/13/2022.    Patient Active Problem List   Diagnosis Date Noted   Annual physical exam 10/17/2021   Depression, major, single episode, severe ([place]) 10/17/2021   Chronic kidney disease (CKD) stage  G4/A1, severely decreased glomerular filtration rate (GFR) between 15-29 mL/min/1.73 square meter and albuminuria creatinine ratio less than 30 mg/g ([place]) 10/17/2021   Primary hypertension 10/17/2021   CKD (chronic kidney disease) stage 4, GFR 15-29 ml/min ([place]) 06/06/2021   Hypersomnia 06/05/2021   Lumbar radiculitis 06/05/2021   Encounter for screening mammogram for malignant neoplasm of breast 06/05/2021   Chronic pain syndrome 04/11/2021   Bilateral primary osteoarthritis of knee 04/11/2021   Lumbar spondylosis 04/11/2021   Sacroiliac joint pain 04/11/2021   Chronic pain of both knees 06/21/2020   Chronic radicular lumbar pain 11/18/2019   Spinal stenosis of lumbar region with neurogenic claudication 11/18/2019   DDD (degenerative disc disease), lumbosacral 11/18/2019   Mild episode of recurrent major depressive disorder ([place]) 10/26/2018   BMI 60.0-69.9, adult ([place]) 07/01/2017   Morbid obesity due to excess calories ([place]) 12/24/2015   Allergic rhinitis 05/29/2015   Anxiety, generalized 05/29/2015   D (diarrhea) 05/29/2015   Avitaminosis D 05/29/2015   Prediabetes 05/29/2015   Fatigue 05/29/2015   Hypertension 03/15/2015   Cannot sleep 11/13/2009   Congenital pes planus 03/13/2008   Hypercholesterolemia without hypertriglyceridemia 09/16/2006    Conditions to be addressed/monitored: Depression; Mental Health Concerns   Care Plan : RN Care Management Plan of Care  Updates made by Vern Claude, LCSW since 02/13/2022 12:00 AM     Problem: CHL AMB "PATIENT-SPECIFIC PROBLEM"   Note:   CARE PLAN ENTRY (see longitudinal plan of care for additional care plan information)  Current Barriers:  Knowledge deficits related to accessing mental health provider in patient with Depression  Patient is experiencing symptoms of  depression which seem to be excarerbated by feelings overwhelm in regards to her family dynamics Patient needs Support, Education, and Care Coordination in order  to meet unmet mental health needs  Mental Health Concerns   Clinical Social Work Goal(s):  Over the next 90 days, patient will work with SW bi-weekly by telephone or in person to reduce or manage symptoms of depression until connected for ongoing counseling resources.  Patient will implement clinical interventions discussed today to decreases symptoms of depression and increase knowledge and/or ability of: coping skills. Interventions:  Continued to assess patient's understanding, education, previous treatment and care coordination needs  Provided basic mental health support, education and interventions  Patient discussed recent family crisis, verbalization of feelings encouraged, active listening/reflection utilized emotional support provided-patient denies thoughts of self harm Patient discussed continued depressive symptoms, lack of motivation and fatigue, exacerbated by family crisis Patient continues to consider options for alternative living, results remain pending Patient continues to have confirm having a strong network of support-frequent calls and visits from family expressing concern and support Positive coping strategies continue to be discussed and reinforced Continued to discuss importance of ongoing counseling, however per patient she is extremely reluctant, setting clear boundaries with family and practicing self care emphasized Confirmed plan to focus on hobbies that she is enjoys-painting, making jewelry, coloring Patient continues to be interested in group therapy but has not called-following resources provided  for group therapy: [facility] [phone] and [facility] [phone] has not called but remains agreeable to contacting this agency for additional support-encouragement provided to follow through on group/individual counseling Collaborated with appropriate clinical care team members regarding patient needs through  provider co-signature Discuss plan to develop care coordination plan during next visit as patient is declining ongoing mental health follow up at this time  Patient Self Care Activities & Deficits:  Patient is unable to independently navigate community resource options without care coordination support Patient is able to implement clinical interventions discussed today and is motivated for treatment  Patient will select one of the agencies from the list provided and call to schedule an appointment  Performs ADL's independently Performs IADL's independently Ability for insight Motivation for treatment Strong family or social support  Please see past updates related to this goal by clicking on the "Past Updates" button in the selected goal        Follow Up Plan: SW will follow up with patient by phone over the next 30 business days   [place], [place] Worker  [facility] [phone]

## 2022-02-13 NOTE — Patient Instructions (Addendum)
Visit Information  Thank you for taking time to visit with me today. Please don't hesitate to contact me if I can be of assistance to you before our next scheduled telephone appointment.  Following are the goals we discussed today:  -continue to consider personal counseling -practice self care - practice relaxation or meditation daily - practice positive thinking and self-talk  -contact the following agencies for group therapy: [facility] [phone] [facility] [phone], [facility] [phone]  Our next appointment is by telephone on 03/04/22 at 1pm  Please call the care guide team at [phone] if you need to cancel or reschedule your appointment.   If you are experiencing a Mental Health or [place] or need someone to talk to, please call the Suicide and Crisis Lifeline: 988   Patient verbalizes understanding of instructions and care plan provided today and agrees to view in [place]. Active MyChart status and patient understanding of how to access instructions and care plan via MyChart confirmed with patient.     Telephone follow up appointment with care management team member scheduled for:   Elliot Gurney, [place] Worker  [facility] [phone]

## 2022-02-28 ENCOUNTER — Encounter: Payer: Self-pay | Admitting: Family Medicine

## 2022-03-01 ENCOUNTER — Other Ambulatory Visit: Payer: Self-pay | Admitting: Family Medicine

## 2022-03-04 ENCOUNTER — Ambulatory Visit: Payer: 59 | Admitting: *Deleted

## 2022-03-04 DIAGNOSIS — F322 Major depressive disorder, single episode, severe without psychotic features: Secondary | ICD-10-CM

## 2022-03-04 DIAGNOSIS — I10 Essential (primary) hypertension: Secondary | ICD-10-CM

## 2022-03-04 NOTE — Chronic Care Management (AMB) (Signed)
Care Management Clinical Social Work Note  03/04/2022 Name: Ashley Mccullough MRN: [id number] DOB: [birth year]  Ashley Mccullough is a 61 y.o. year old female who is a primary care patient of Ashley Sprout, FNP.  The Care Management team was consulted for assistance with chronic disease management and coordination needs.  Engaged with patient by telephone for follow up visit in response to provider referral for social work chronic care management and care coordination services  Consent to Services:  Ashley Mccullough was given information about Care Management services today including:  Care Management services includes personalized support from designated clinical staff supervised by her physician, including individualized plan of care and coordination with other care providers 24/7 contact phone numbers for assistance for urgent and routine care needs. The patient may stop case management services at any time by phone call to the office staff.  Patient agreed to services and consent obtained.   Assessment: Review of patient past medical history, allergies, medications, and health status, including review of relevant consultants reports was performed today as part of a comprehensive evaluation and provision of chronic care management and care coordination services.  SDOH (Social Determinants of Health) assessments and interventions performed:    Advanced Directives Status: Not addressed in this encounter.  Care Plan  Allergies  Allergen Reactions   Cashew Nut Oil Hives, Itching and Swelling   Latex Itching   Norvasc [Amlodipine] Other (See Comments)    Dizziness, flushed   Simvastatin     Other reaction(s): Unknown Memory changes Memory changes   Sulfa Antibiotics     Other reaction(s): Unknown   Levofloxacin Rash   Prednisone Rash    Outpatient Encounter Medications as of 03/04/2022  Medication Sig Note   buPROPion (WELLBUTRIN XL) 150 MG 24 hr tablet Take 1 tablet (150 mg  total) by mouth daily.    traZODone (DESYREL) 100 MG tablet Take 1-2 tablets at night prior to sleep    acetaminophen (TYLENOL) 650 MG CR tablet Take 650-1,300 mg by mouth at bedtime. As needed for arthritis.    amLODipine (NORVASC) 5 MG tablet Take 1 tablet by mouth daily. (Patient not taking: Reported on 10/21/2021)    Cetirizine HCl 10 MG CAPS Take by mouth. 01/19/2019: Pt takes 1 tablet daily.   Cholecalciferol (VITAMIN D3) 5000 units TABS Take 5,000 tablets by mouth daily.    COLLAGEN PO Take by mouth.    FLUoxetine (PROZAC) 20 MG capsule Take 1 capsule (20 mg total) by mouth daily. Take once daily with 40 mg dose for total of 60 mg.    FLUoxetine (PROZAC) 40 MG capsule Take 1 capsule (40 mg total) by mouth daily. Take once daily with 20 mg dose for total of 60 mg.    gabapentin (NEURONTIN) 300 MG capsule Take 2 capsules by mouth 3 (three) times daily. 10/21/2021: Reports taking twice a day   hydrOXYzine (ATARAX/VISTARIL) 25 MG tablet Take 1 tablet (25 mg total) by mouth every 4 (four) hours as needed for anxiety.    losartan-hydrochlorothiazide (HYZAAR) 100-25 MG tablet TAKE 1 TABLET BY MOUTH DAILY    meloxicam (MOBIC) 15 MG tablet Take 1 tablet (15 mg total) by mouth daily. (Patient not taking: Reported on 10/17/2021)    metaxalone (SKELAXIN) 800 MG tablet Take 0.5-1 tablets (400-800 mg total) by mouth 3 (three) times daily as needed for muscle spasms. (Patient not taking: Reported on 10/21/2021)    metoprolol succinate (TOPROL-XL) 25 MG 24 hr tablet TAKE 1 TABLET(25 MG) BY  MOUTH DAILY    montelukast (SINGULAIR) 10 MG tablet Take 1 tablet by mouth daily.    traMADol (ULTRAM) 50 MG tablet  10/21/2021: Reports taking very rarely if needed   No facility-administered encounter medications on file as of 03/04/2022.    Patient Active Problem List   Diagnosis Date Noted   Annual physical exam 10/17/2021   Depression, major, single episode, severe ([place]) 10/17/2021   Chronic kidney disease (CKD) stage  G4/A1, severely decreased glomerular filtration rate (GFR) between 15-29 mL/min/1.73 square meter and albuminuria creatinine ratio less than 30 mg/g ([place]) 10/17/2021   Primary hypertension 10/17/2021   CKD (chronic kidney disease) stage 4, GFR 15-29 ml/min ([place]) 06/06/2021   Hypersomnia 06/05/2021   Lumbar radiculitis 06/05/2021   Encounter for screening mammogram for malignant neoplasm of breast 06/05/2021   Chronic pain syndrome 04/11/2021   Bilateral primary osteoarthritis of knee 04/11/2021   Lumbar spondylosis 04/11/2021   Sacroiliac joint pain 04/11/2021   Chronic pain of both knees 06/21/2020   Chronic radicular lumbar pain 11/18/2019   Spinal stenosis of lumbar region with neurogenic claudication 11/18/2019   DDD (degenerative disc disease), lumbosacral 11/18/2019   Mild episode of recurrent major depressive disorder ([place]) 10/26/2018   BMI 60.0-69.9, adult ([place]) 07/01/2017   Morbid obesity due to excess calories ([place]) 12/24/2015   Allergic rhinitis 05/29/2015   Anxiety, generalized 05/29/2015   D (diarrhea) 05/29/2015   Avitaminosis D 05/29/2015   Prediabetes 05/29/2015   Fatigue 05/29/2015   Hypertension 03/15/2015   Cannot sleep 11/13/2009   Congenital pes planus 03/13/2008   Hypercholesterolemia without hypertriglyceridemia 09/16/2006    Conditions to be addressed/monitored: Depression; Mental Health Concerns   Care Plan : RN Care Management Plan of Care  Updates made by Vern Claude, LCSW since 03/04/2022 12:00 AM     Problem: CHL AMB "PATIENT-SPECIFIC PROBLEM"   Note:   CARE PLAN ENTRY (see longitudinal plan of care for additional care plan information)  Current Barriers:  Knowledge deficits related to accessing mental health provider in patient with Depression  Patient is experiencing symptoms of  depression which seem to be excarerbated by feelings overwhelm in regards to her family dynamics Patient needs Support, Education, and Care Coordination in order  to meet unmet mental health needs  Mental Health Concerns   Clinical Social Work Goal(s):  Over the next 90 days, patient will work with SW bi-weekly by telephone or in person to reduce or manage symptoms of depression until connected for ongoing counseling resources.  Patient will implement clinical interventions discussed today to decreases symptoms of depression and increase knowledge and/or ability of: coping skills. Interventions:  Continued to assess patient's understanding, education, previous treatment and care coordination needs  Provided basic mental health support, education and interventions  Patient acknowledged continued depressed mood, however feels that she is at baseline and is able to verbalize appropriate coping mechanisms to manage her mood Patient continues to consider options for alternative living, results remain pending Patient continues to have confirm having a strong network of support-frequent calls and visits from family expressing concern and support-appropriate boundary setting reinforced Positive coping strategies continue to be discussed and reinforced Continued to discuss importance of ongoing counseling, patient will continue to consider this option Patient continues to be interested in group therapy but has not called-following resources provided for group therapy: [facility] [phone] and [facility] [phone] has not called but remains agreeable to contacting this agency for additional support-encouragement provided to  follow through on group/individual counseling Collaborated with appropriate clinical care team members regarding patient needs through provider co-signature Discuss plan to close patient's case at this time, patient will benefit from long term mental health counseling-resources previously provided  Patient Self Care Activities & Deficits:  Patient is unable to independently navigate  community resource options without care coordination support Patient is able to implement clinical interventions discussed today and is motivated for treatment  Patient will select one of the agencies from the list provided and call to schedule an appointment  Performs ADL's independently Performs IADL's independently Ability for insight Motivation for treatment Strong family or social support  Please see past updates related to this goal by clicking on the "Past Updates" button in the selected goal        Follow Up Plan:  No further follow up needed at this time, patient will contact her providers office with additional community resource needs.   Elliot Gurney, [place] Worker  [facility] [phone]

## 2022-03-04 NOTE — Patient Instructions (Signed)
Visit Information  Thank you for taking time to visit with me today. Please don't hesitate to contact me if I can be of assistance to you before our next scheduled telephone appointment.  Following are the goals we discussed today:  begin personal counseling-if needed -practice self care - practice relaxation or meditation daily - practice positive thinking and self-talk  -contact the following agencies for group therapy: [facility] [phone], [facility] [phone]   If you are experiencing a [place] or [place] or need someone to talk to, please call the Suicide and Crisis Lifeline: 988   Patient verbalizes understanding of instructions and care plan provided today and agrees to view in [place]. Active MyChart status and patient understanding of how to access instructions and care plan via MyChart confirmed with patient.     No further follow up required: patient to consider referrals that were previously provided for ongoing mental health follow up if needed.   Elliot Gurney, [place] Worker  [facility] [phone]

## 2022-03-17 ENCOUNTER — Ambulatory Visit: Payer: 59

## 2022-03-17 DIAGNOSIS — N184 Chronic kidney disease, stage 4 (severe): Secondary | ICD-10-CM

## 2022-03-17 DIAGNOSIS — I10 Essential (primary) hypertension: Secondary | ICD-10-CM

## 2022-03-17 DIAGNOSIS — F322 Major depressive disorder, single episode, severe without psychotic features: Secondary | ICD-10-CM

## 2022-03-17 NOTE — Chronic Care Management (AMB) (Signed)
Chronic Care Management   CCM RN Visit Note  03/17/2022 Name: Ashley Mccullough MRN: [id number] DOB: 01-17-1961  Subjective: Ashley Mccullough is a 60 y.o. year old female who is a primary care patient of Gwyneth Sprout, FNP. The care management team was consulted for assistance with disease management and care coordination needs.    Engaged with patient by telephone for follow up visit in response to provider referral for case management and care coordination services.   Consent to Services:  The patient was given information about Chronic Care Management services, agreed to services, and gave verbal consent prior to initiation of services.  Please see initial visit note for detailed documentation.   Assessment: Review of patient past medical history, allergies, medications, health status, including review of consultants reports, laboratory and other test data, was performed as part of comprehensive evaluation and provision of chronic care management services.   SDOH (Social Determinants of Health) assessments and interventions performed: No  CCM Care Plan  Allergies  Allergen Reactions   Cashew Nut Oil Hives, Itching and Swelling   Latex Itching   Norvasc [Amlodipine] Other (See Comments)    Dizziness, flushed   Simvastatin     Other reaction(s): Unknown Memory changes Memory changes   Sulfa Antibiotics     Other reaction(s): Unknown   Levofloxacin Rash   Prednisone Rash    Outpatient Encounter Medications as of 03/17/2022  Medication Sig Note   buPROPion (WELLBUTRIN XL) 150 MG 24 hr tablet Take 1 tablet (150 mg total) by mouth daily.    traZODone (DESYREL) 100 MG tablet Take 1-2 tablets at night prior to sleep    acetaminophen (TYLENOL) 650 MG CR tablet Take 650-1,300 mg by mouth at bedtime. As needed for arthritis.    amLODipine (NORVASC) 5 MG tablet Take 1 tablet by mouth daily. (Patient not taking: Reported on 10/21/2021)    Cetirizine HCl 10 MG CAPS Take by mouth.  01/19/2019: Pt takes 1 tablet daily.   Cholecalciferol (VITAMIN D3) 5000 units TABS Take 5,000 tablets by mouth daily. (Patient not taking: Reported on 03/17/2022)    COLLAGEN PO Take by mouth.    FLUoxetine (PROZAC) 20 MG capsule Take 1 capsule (20 mg total) by mouth daily. Take once daily with 40 mg dose for total of 60 mg.    FLUoxetine (PROZAC) 40 MG capsule Take 1 capsule (40 mg total) by mouth daily. Take once daily with 20 mg dose for total of 60 mg.    gabapentin (NEURONTIN) 300 MG capsule Take 2 capsules by mouth 3 (three) times daily. 10/21/2021: Reports taking twice a day   hydrOXYzine (ATARAX/VISTARIL) 25 MG tablet Take 1 tablet (25 mg total) by mouth every 4 (four) hours as needed for anxiety.    losartan-hydrochlorothiazide (HYZAAR) 100-25 MG tablet TAKE 1 TABLET BY MOUTH DAILY    meloxicam (MOBIC) 15 MG tablet Take 1 tablet (15 mg total) by mouth daily. (Patient not taking: Reported on 10/17/2021)    metaxalone (SKELAXIN) 800 MG tablet Take 0.5-1 tablets (400-800 mg total) by mouth 3 (three) times daily as needed for muscle spasms. (Patient not taking: Reported on 10/21/2021)    metoprolol succinate (TOPROL-XL) 25 MG 24 hr tablet TAKE 1 TABLET(25 MG) BY MOUTH DAILY    montelukast (SINGULAIR) 10 MG tablet Take 1 tablet by mouth daily.    traMADol (ULTRAM) 50 MG tablet  10/21/2021: Reports taking very rarely if needed   No facility-administered encounter medications on file as of 03/17/2022.  Patient Active Problem List   Diagnosis Date Noted   Annual physical exam 10/17/2021   Depression, major, single episode, severe ([place]) 10/17/2021   Chronic kidney disease (CKD) stage G4/A1, severely decreased glomerular filtration rate (GFR) between 15-29 mL/min/1.73 square meter and albuminuria creatinine ratio less than 30 mg/g ([place]) 10/17/2021   Primary hypertension 10/17/2021   CKD (chronic kidney disease) stage 4, GFR 15-29 ml/min ([place]) 06/06/2021   Hypersomnia 06/05/2021   Lumbar radiculitis  06/05/2021   Encounter for screening mammogram for malignant neoplasm of breast 06/05/2021   Chronic pain syndrome 04/11/2021   Bilateral primary osteoarthritis of knee 04/11/2021   Lumbar spondylosis 04/11/2021   Sacroiliac joint pain 04/11/2021   Chronic pain of both knees 06/21/2020   Chronic radicular lumbar pain 11/18/2019   Spinal stenosis of lumbar region with neurogenic claudication 11/18/2019   DDD (degenerative disc disease), lumbosacral 11/18/2019   Mild episode of recurrent major depressive disorder ([place]) 10/26/2018   BMI 60.0-69.9, adult ([place]) 07/01/2017   Morbid obesity due to excess calories ([place]) 12/24/2015   Allergic rhinitis 05/29/2015   Anxiety, generalized 05/29/2015   D (diarrhea) 05/29/2015   Avitaminosis D 05/29/2015   Prediabetes 05/29/2015   Fatigue 05/29/2015   Hypertension 03/15/2015   Cannot sleep 11/13/2009   Congenital pes planus 03/13/2008   Hypercholesterolemia without hypertriglyceridemia 09/16/2006     Patient Care Plan: RN Care Management Plan of Care     Problem Identified: HTN, HLD, CKD, Anxiety and Depression      Long-Range Goal: Disease Progression Prevented or Minimized   Start Date: 10/21/2021  Expected End Date: 01/19/2022  Priority: High  Note:   Current Barriers:  Care Management support and education needs related to HTN, HLD, Anxiety, and Depression .  RNCM Clinical Goal(s):  Patient will demonstrate improved adherence to prescribed treatment plan for HTN, HLD, Anxiety, and Depression through collaboration with the provider, RN Care Manager and the care team.   Interventions: 1:1 collaboration with primary care provider regarding development and update of comprehensive plan of care as evidenced by provider attestation and co-signature Inter-disciplinary care team collaboration (see longitudinal plan of care) Evaluation of current treatment plan related to  self management and patient's adherence to plan as established by  provider   Hyperlipidemia Interventions:   Reviewed provider established cholesterol goals Discussed importance of completing regular laboratory monitoring as prescribed Reviewed importance of limiting foods high in cholesterol Reviewed exercise goals. Mobility remains limited due chronic shoulder and joint pain. Advised to engage in purposeful low impact activities as tolerated.   Hypertension Interventions:  Reviewed plan for hypertension management. Reports taking medications as prescribed. Reviewed established blood pressure parameters along with indications for notifying a provider. Reports not monitoring consistently due to needing a new BP monitor. Advised to monitor BP at least weekly if unable to monitor daily after obtaining a new monitor. Advised to record readings to identify trends. Reviewed importance of complying with a cardiac prudent/heart healthy diet. Encouraged to read nutrition labels, monitor sodium intake, and avoid highly processed foods when possible. Reviewed complications of uncontrolled blood pressure.  Reviewed s/sx of heart attack, stroke and worsening symptoms that require immediate medical attention.  Wellness Interventions:    Reviewed current treatment plan r/t depression and anxiety. Reports adhering to current medication plan. Reports she is not currently attending regular counseling sessions. Reports doing well at this point but agreed to contact the clinic if she opts to reengage with Psychiatry and counseling.  Discussed concerns regarding housing and  rise in rental prices. Provided options for senior and income restricted housing in [place]. Agreed to contact the properties. Declined need for current financial/housing assistance. Agreed to contact the clinic if additional assistance is needed.  Patient Goals/Self-Care Activities: Take all medications as prescribed Attend all scheduled provider appointments Call pharmacy for medication refills 3-7  days in advance of running out of medications Perform all self care activities independently  Call provider office for new concerns or questions         PLAN: Ms. Bejar will contact the clinic if she requires additional outreach. The care management team will gladly assist.   [facility] Care Management [phone]

## 2022-04-01 ENCOUNTER — Other Ambulatory Visit: Payer: Self-pay | Admitting: Family Medicine

## 2022-04-04 ENCOUNTER — Other Ambulatory Visit: Payer: Self-pay | Admitting: Family Medicine

## 2022-04-07 NOTE — Telephone Encounter (Signed)
Requested Prescriptions  Pending Prescriptions Disp Refills  . buPROPion (WELLBUTRIN XL) 150 MG 24 hr tablet [Pharmacy Med Name: BUPROPION HCL XL 150 MG TABLET] 30 tablet 2    Sig: TAKE 1 TABLET BY MOUTH EVERY DAY     Psychiatry: Antidepressants - bupropion Failed - 04/04/2022  4:38 PM      Failed - Cr in normal range and within 360 days    Creatinine, Ser  Date Value Ref Range Status  10/17/2021 1.93 (H) 0.57 - 1.00 mg/dL Final         Failed - Last BP in normal range    BP Readings from Last 1 Encounters:  10/17/21 (!) 152/89         Passed - AST in normal range and within 360 days    AST  Date Value Ref Range Status  10/17/2021 12 0 - 40 IU/L Final         Passed - ALT in normal range and within 360 days    ALT  Date Value Ref Range Status  10/17/2021 12 0 - 32 IU/L Final         Passed - Completed PHQ-2 or PHQ-9 in the last 360 days      Passed - Valid encounter within last 6 months    Recent Outpatient Visits          5 months ago Annual physical exam   [facility] Gwyneth Sprout, FNP   10 months ago Primary hypertension   [facility] Gwyneth Sprout, FNP   1 year ago Primary hypertension   [facility] Bacigal[place], Dionne Bucy, MD   1 year ago Primary hypertension   [place], Kelby Aline, [place]   1 year ago Primary hypertension   [place], Clearnce Sorrel, [place]      Future Appointments            In 1 week Gwyneth Sprout, [place], [place]

## 2022-04-16 ENCOUNTER — Ambulatory Visit: Payer: 59 | Admitting: Family Medicine

## 2022-04-28 LAB — HEMOGLOBIN A1C: Hemoglobin A1C: 6.4

## 2022-05-09 ENCOUNTER — Other Ambulatory Visit: Payer: Self-pay | Admitting: Family Medicine

## 2022-05-09 DIAGNOSIS — I10 Essential (primary) hypertension: Secondary | ICD-10-CM

## 2022-05-13 ENCOUNTER — Ambulatory Visit: Payer: 59 | Admitting: Family Medicine

## 2022-06-02 ENCOUNTER — Encounter: Payer: Self-pay | Admitting: Family Medicine

## 2022-06-02 ENCOUNTER — Ambulatory Visit: Payer: 59 | Admitting: Family Medicine

## 2022-06-02 DIAGNOSIS — F419 Anxiety disorder, unspecified: Secondary | ICD-10-CM | POA: Diagnosis not present

## 2022-06-02 DIAGNOSIS — M793 Panniculitis, unspecified: Secondary | ICD-10-CM | POA: Diagnosis not present

## 2022-06-02 DIAGNOSIS — M5137 Other intervertebral disc degeneration, lumbosacral region: Secondary | ICD-10-CM

## 2022-06-02 DIAGNOSIS — Z6841 Body Mass Index (BMI) 40.0 and over, adult: Secondary | ICD-10-CM | POA: Diagnosis not present

## 2022-06-02 DIAGNOSIS — I10 Essential (primary) hypertension: Secondary | ICD-10-CM | POA: Diagnosis not present

## 2022-06-02 DIAGNOSIS — R7303 Prediabetes: Secondary | ICD-10-CM | POA: Diagnosis not present

## 2022-06-02 DIAGNOSIS — R69 Illness, unspecified: Secondary | ICD-10-CM | POA: Diagnosis not present

## 2022-06-02 DIAGNOSIS — E782 Mixed hyperlipidemia: Secondary | ICD-10-CM | POA: Diagnosis not present

## 2022-06-02 DIAGNOSIS — Z23 Encounter for immunization: Secondary | ICD-10-CM | POA: Insufficient documentation

## 2022-06-02 MED ORDER — BUPROPION HCL ER (XL) 150 MG PO TB24: 300.0000 mg | ORAL_TABLET | Freq: Every day | ORAL | 1 refills | Status: DC

## 2022-06-02 MED ORDER — METOPROLOL SUCCINATE ER 25 MG PO TB24: ORAL_TABLET | ORAL | 1 refills | Status: DC

## 2022-06-02 MED ORDER — HYDROXYZINE PAMOATE 25 MG PO CAPS: 25.0000 mg | ORAL_CAPSULE | Freq: Three times a day (TID) | ORAL | 0 refills | Status: DC | PRN

## 2022-06-02 MED ORDER — HYDRALAZINE HCL 10 MG PO TABS: 10.0000 mg | ORAL_TABLET | Freq: Three times a day (TID) | ORAL | 1 refills | Status: DC

## 2022-06-02 MED ORDER — LOSARTAN POTASSIUM-HCTZ 100-25 MG PO TABS: 1.0000 | ORAL_TABLET | Freq: Every day | ORAL | 1 refills | Status: DC

## 2022-06-02 MED ORDER — AMLODIPINE BESYLATE 5 MG PO TABS: 5.0000 mg | ORAL_TABLET | Freq: Every day | ORAL | 1 refills | Status: DC

## 2022-06-02 MED ORDER — FLUOXETINE HCL 40 MG PO CAPS: 40.0000 mg | ORAL_CAPSULE | Freq: Every day | ORAL | 1 refills | Status: DC

## 2022-06-02 MED ORDER — MONTELUKAST SODIUM 10 MG PO TABS: 10.0000 mg | ORAL_TABLET | Freq: Every day | ORAL | 1 refills | Status: DC

## 2022-06-02 MED ORDER — FLUOXETINE HCL 20 MG PO CAPS: 20.0000 mg | ORAL_CAPSULE | Freq: Every day | ORAL | 1 refills | Status: DC

## 2022-06-02 NOTE — Assessment & Plan Note (Signed)
Chronic, worsening Increase wellbutrin to 300 mg to assist Discussed potential side effects, incl GI upset, sexual dysfunction, increased anxiety, and SI Discussed that it can take 6-8 weeks to reach full efficacy Contracted for safety - no SI/HI Discussed synergistic effects of medications and therapy

## 2022-06-02 NOTE — Patient Instructions (Signed)
Please call and schedule your mammogram:  [facility]  [address], [address] [facility] [place],  [place]  [address] Get Driving Directions Main: [phone]  Sunday:Closed Monday:7:20 AM - 5:00 PM Tuesday:7:20 AM - 5:00 PM Wednesday:7:20 AM - 5:00 PM Thursday:7:20 AM - 5:00 PM Friday:7:20 AM - 4:30 PM Saturday:Closed  

## 2022-06-02 NOTE — Assessment & Plan Note (Signed)
Chronic, stable Continue to recommend balanced, lower carb meals. Smaller meal size, adding snacks. Choosing water as drink of choice and increasing purposeful exercise. Repeat A1c

## 2022-06-02 NOTE — Assessment & Plan Note (Signed)
Chronic, uncontrolled Goal of <140/<90 Add hydral 10 TID with follow up within 4-6 weeks Continue toprol 25 mg daily Continue hyzaar 100-25 mg  Declines 5 mg of norvasc d/t "flushing"

## 2022-06-02 NOTE — Assessment & Plan Note (Signed)
Body mass index is 61.29 kg/m.

## 2022-06-02 NOTE — Assessment & Plan Note (Signed)
Hx of healing boil; appears closed, non warm, non tender, no active drainage Check CBC given recent infection with concern for abscess

## 2022-06-02 NOTE — Assessment & Plan Note (Signed)
Chronic, stable Remains confined to apartment d/t mobility issues

## 2022-06-02 NOTE — Assessment & Plan Note (Signed)
Chronic, stable Plans to change; however, not successful Discussed importance of healthy weight management Discussed diet and exercise

## 2022-06-02 NOTE — Assessment & Plan Note (Signed)
Consented; VIS made available; no immediate side effects following administration; plan to repeat yearly

## 2022-06-02 NOTE — Progress Notes (Signed)
Established patient visit   Patient: Ashley Mccullough   DOB: [birth year]   61 y.o. Female  MRN: [id number] Visit Date: 06/02/2022  Today's healthcare provider: Gwyneth Sprout, FNP  Re Introduced to nurse practitioner role and practice setting.  All questions answered.  Discussed provider/patient relationship and expectations.  I,Tiffany J Bragg,acting as a scribe for Gwyneth Sprout, FNP.,have documented all relevant documentation on the behalf of Gwyneth Sprout, FNP,as directed by  Gwyneth Sprout, FNP while in the presence of Gwyneth Sprout, FNP.   Chief Complaint  Patient presents with   Depression   Hypertension   Hyperlipidemia   Wound Check    Patient complains of a wound on her abdomen that will not heal, has been there for months.    Subjective    HPI HPI     Wound Check    Additional comments: Patient complains of a wound on her abdomen that will not heal, has been there for months.       Last edited by Smitty Knudsen, CMA on 06/02/2022  2:27 PM.      Depression, Follow-up  She  was last seen for this 7 months ago. Changes made at last visit include continue medication.   She reports excellent compliance with treatment. She is not having side effects.   She reports excellent tolerance of treatment. Current symptoms include: depressed mood, difficulty concentrating, fatigue, feelings of worthlessness/guilt, hopelessness, hypersomnia, and insomnia She feels she is Unchanged since last visit.     06/02/2022    2:32 PM 03/04/2022    1:57 PM 11/05/2021    2:36 PM  Depression screen PHQ 2/9  Decreased Interest '3 2 3  ' Down, Depressed, Hopeless '2 2 2  ' PHQ - 2 Score '5 4 5  ' Altered sleeping '3 1 1  ' Tired, decreased energy 3 1   Change in appetite 3 1 0  Feeling bad or failure about yourself  3 3   Trouble concentrating 2 2   Moving slowly or fidgety/restless 0 0   Suicidal thoughts 1 0   PHQ-9 Score '20 12 6  ' Difficult doing work/chores Extremely dIfficult       -----------------------------------------------------------------------------------------  Hypertension, follow-up  BP Readings from Last 3 Encounters:  06/02/22 (!) 181/92  10/17/21 (!) 152/89  06/05/21 (!) 158/93   Wt Readings from Last 3 Encounters:  06/02/22 (!) 346 lb (156.9 kg)  10/17/21 (!) 345 lb 8 oz (156.7 kg)  08/26/21 (!) 344 lb (156 kg)     She was last seen for hypertension 7 months ago.  BP at that visit was 152/89. Management since that visit includes continue medication.  She reports excellent compliance with treatment. She is not having side effects.  She is following a Regular diet. She is not exercising. She does not smoke.  Use of agents associated with hypertension: none.   Outside blood pressures are not checked. Symptoms: No chest pain No chest pressure  No palpitations No syncope  No dyspnea No orthopnea  No paroxysmal nocturnal dyspnea No lower extremity edema   Pertinent labs Lab Results  Component Value Date   CHOL 257 (H) 10/17/2021   HDL 65 10/17/2021   LDLCALC 177 (H) 10/17/2021   TRIG 90 10/17/2021   CHOLHDL 4.0 10/17/2021   Lab Results  Component Value Date   NA 141 10/17/2021   K 5.0 10/17/2021   CREATININE 1.93 (H) 10/17/2021   EGFR 29 (L) 10/17/2021  GLUCOSE 95 10/17/2021   TSH 3.360 06/05/2021     The 10-year ASCVD risk score (Arnett DK, et al., 2019) is: 20.9%  ---------------------------------------------------------------------------------------------------  Lipid/Cholesterol, Follow-up  Last lipid panel Other pertinent labs  Lab Results  Component Value Date   CHOL 257 (H) 10/17/2021   HDL 65 10/17/2021   LDLCALC 177 (H) 10/17/2021   TRIG 90 10/17/2021   CHOLHDL 4.0 10/17/2021   Lab Results  Component Value Date   ALT 12 10/17/2021   AST 12 10/17/2021   PLT 281 06/05/2021   TSH 3.360 06/05/2021     She was last seen for this 7 months ago.  Management since that visit includes continue  medication.  She reports excellent compliance with treatment. She is not having side effects.   Symptoms: No chest pain No chest pressure/discomfort  No dyspnea No lower extremity edema  No numbness or tingling of extremity No orthopnea  No palpitations No paroxysmal nocturnal dyspnea  No speech difficulty No syncope   Current diet: in general, an "unhealthy" diet Current exercise: none  The 10-year ASCVD risk score (Arnett DK, et al., 2019) is: 20.9%  ---------------------------------------------------------------------------------------------------   Medications: Outpatient Medications Prior to Visit  Medication Sig   acetaminophen (TYLENOL) 650 MG CR tablet Take 650-1,300 mg by mouth at bedtime. As needed for arthritis.   calcitRIOL (ROCALTROL) 0.25 MCG capsule Take 0.25 mcg by mouth daily.   Cetirizine HCl 10 MG CAPS Take by mouth.   Cholecalciferol (VITAMIN D3) 5000 units TABS Take 5,000 tablets by mouth daily.   COLLAGEN PO Take by mouth.   [DISCONTINUED] amLODipine (NORVASC) 5 MG tablet Take 1 tablet by mouth daily.   [DISCONTINUED] buPROPion (WELLBUTRIN XL) 150 MG 24 hr tablet TAKE 1 TABLET BY MOUTH EVERY DAY   [DISCONTINUED] FLUoxetine (PROZAC) 20 MG capsule Take 1 capsule (20 mg total) by mouth daily. Take once daily with 40 mg dose for total of 60 mg.   [DISCONTINUED] FLUoxetine (PROZAC) 40 MG capsule Take 1 capsule (40 mg total) by mouth daily. Take once daily with 20 mg dose for total of 60 mg.   [DISCONTINUED] gabapentin (NEURONTIN) 300 MG capsule Take 2 capsules by mouth 3 (three) times daily.   [DISCONTINUED] hydrOXYzine (ATARAX/VISTARIL) 25 MG tablet Take 1 tablet (25 mg total) by mouth every 4 (four) hours as needed for anxiety.   [DISCONTINUED] losartan-hydrochlorothiazide (HYZAAR) 100-25 MG tablet TAKE 1 TABLET BY MOUTH DAILY   [DISCONTINUED] metoprolol succinate (TOPROL-XL) 25 MG 24 hr tablet TAKE 1 TABLET BY MOUTH DAILY   [DISCONTINUED] montelukast (SINGULAIR)  10 MG tablet TAKE 1 TABLET BY MOUTH EVERY DAY   [DISCONTINUED] traMADol (ULTRAM) 50 MG tablet    [DISCONTINUED] traZODone (DESYREL) 100 MG tablet Take 1-2 tablets at night prior to sleep   [DISCONTINUED] meloxicam (MOBIC) 15 MG tablet Take 1 tablet (15 mg total) by mouth daily. (Patient not taking: Reported on 06/02/2022)   [DISCONTINUED] metaxalone (SKELAXIN) 800 MG tablet Take 0.5-1 tablets (400-800 mg total) by mouth 3 (three) times daily as needed for muscle spasms. (Patient not taking: Reported on 06/02/2022)   No facility-administered medications prior to visit.    Review of Systems    Objective    BP (!) 181/92 (BP Location: Right Wrist, Patient Position: Sitting, Cuff Size: Large)   Pulse 77   Resp 17   Ht '5\' 3"'  (1.6 m)   Wt (!) 346 lb (156.9 kg)   SpO2 98%   BMI 61.29 kg/m   Physical Exam Vitals  and nursing note reviewed.  Constitutional:      General: She is not in acute distress.    Appearance: Normal appearance. She is obese. She is not ill-appearing, toxic-appearing or diaphoretic.  HENT:     Head: Normocephalic and atraumatic.  Cardiovascular:     Rate and Rhythm: Normal rate and regular rhythm.     Pulses: Normal pulses.     Heart sounds: Normal heart sounds. No murmur heard.    No friction rub. No gallop.  Pulmonary:     Effort: Pulmonary effort is normal. No respiratory distress.     Breath sounds: Normal breath sounds. No stridor. No wheezing, rhonchi or rales.  Chest:     Chest wall: No tenderness.  Abdominal:     General: Bowel sounds are normal.     Palpations: Abdomen is soft.     Comments: Healing abscess to L abdominal roll  Musculoskeletal:        General: No swelling, tenderness, deformity or signs of injury. Normal range of motion.     Right lower leg: No edema.     Left lower leg: No edema.  Skin:    General: Skin is warm and dry.     Capillary Refill: Capillary refill takes less than 2 seconds.     Coloration: Skin is not jaundiced or pale.      Findings: No bruising, erythema, lesion or rash.  Neurological:     General: No focal deficit present.     Mental Status: She is alert and oriented to person, place, and time. Mental status is at baseline.     Cranial Nerves: No cranial nerve deficit.     Sensory: No sensory deficit.     Motor: No weakness.     Coordination: Coordination normal.  Psychiatric:        Mood and Affect: Mood is depressed. Affect is flat.        Behavior: Behavior normal.        Thought Content: Thought content normal. Thought content does not include homicidal or suicidal ideation. Thought content does not include homicidal or suicidal plan.        Judgment: Judgment normal.     No results found for any visits on 06/02/22.  Assessment & Plan     Problem List Items Addressed This Visit       Cardiovascular and Mediastinum   Uncontrolled hypertension    Chronic, uncontrolled Goal of <140/<90 Add hydral 10 TID with follow up within 4-6 weeks Continue toprol 25 mg daily Continue hyzaar 100-25 mg  Declines 5 mg of norvasc d/t "flushing"      Relevant Medications   metoprolol succinate (TOPROL-XL) 25 MG 24 hr tablet   losartan-hydrochlorothiazide (HYZAAR) 100-25 MG tablet   hydrALAZINE (APRESOLINE) 10 MG tablet     Musculoskeletal and Integument   DDD (degenerative disc disease), lumbosacral    Chronic, stable Remains confined to apartment d/t mobility issues       Panniculitis    Hx of healing boil; appears closed, non warm, non tender, no active drainage Check CBC given recent infection with concern for abscess       Relevant Orders   CBC with Differential/Platelet     Other   Anxiety    Chronic, worsening Increase wellbutrin to 300 mg to assist Discussed potential side effects, incl GI upset, sexual dysfunction, increased anxiety, and SI Discussed that it can take 6-8 weeks to reach full efficacy Contracted for safety - no SI/HI  Discussed synergistic effects of medications and  therapy       Relevant Medications   FLUoxetine (PROZAC) 40 MG capsule   FLUoxetine (PROZAC) 20 MG capsule   buPROPion (WELLBUTRIN XL) 150 MG 24 hr tablet   hydrOXYzine (VISTARIL) 25 MG capsule   BMI 60.0-69.9, adult (HCC)    Body mass index is 61.29 kg/m.       Mixed hyperlipidemia   Relevant Medications   metoprolol succinate (TOPROL-XL) 25 MG 24 hr tablet   losartan-hydrochlorothiazide (HYZAAR) 100-25 MG tablet   hydrALAZINE (APRESOLINE) 10 MG tablet   Other Relevant Orders   Comprehensive metabolic panel   Lipid panel   Morbid obesity due to excess calories (HCC) - Primary    Chronic, stable Plans to change; however, not successful Discussed importance of healthy weight management Discussed diet and exercise       Need for influenza vaccination    Consented; VIS made available; no immediate side effects following administration; plan to repeat yearly       Relevant Orders   Flu Vaccine QUAD 6+ mos PF IM (Fluarix Quad PF) (Completed)   Prediabetes    Chronic, stable Continue to recommend balanced, lower carb meals. Smaller meal size, adding snacks. Choosing water as drink of choice and increasing purposeful exercise. Repeat A1c       Relevant Orders   Comprehensive metabolic panel   Lipid panel   Hemoglobin A1c     Return in about 6 weeks (around 07/14/2022) for anxiety and depression, HTN management.      Vonna Kotyk, FNP, have reviewed all documentation for this visit. The documentation on 06/02/22 for the exam, diagnosis, procedures, and orders are all accurate and complete.    Gwyneth Sprout, [place] [phone] (phone) [phone] (fax)  [place]

## 2022-06-03 LAB — COMPREHENSIVE METABOLIC PANEL
ALT: 13 IU/L (ref 0–32)
AST: 14 IU/L (ref 0–40)
Albumin/Globulin Ratio: 1.2 (ref 1.2–2.2)
Albumin: 4.1 g/dL (ref 3.9–4.9)
Alkaline Phosphatase: 81 IU/L (ref 44–121)
BUN/Creatinine Ratio: 14 (ref 12–28)
BUN: 26 mg/dL (ref 8–27)
Bilirubin Total: 0.3 mg/dL (ref 0.0–1.2)
CO2: 22 mmol/L (ref 20–29)
Calcium: 9.6 mg/dL (ref 8.7–10.3)
Chloride: 104 mmol/L (ref 96–106)
Creatinine, Ser: 1.86 mg/dL — ABNORMAL HIGH (ref 0.57–1.00)
Globulin, Total: 3.4 g/dL (ref 1.5–4.5)
Glucose: 104 mg/dL — ABNORMAL HIGH (ref 70–99)
Potassium: 4.6 mmol/L (ref 3.5–5.2)
Sodium: 140 mmol/L (ref 134–144)
Total Protein: 7.5 g/dL (ref 6.0–8.5)
eGFR: 30 mL/min/{1.73_m2} — ABNORMAL LOW (ref >59)

## 2022-06-03 LAB — LIPID PANEL
Chol/HDL Ratio: 4.1 ratio (ref 0.0–4.4)
Cholesterol, Total: 287 mg/dL — ABNORMAL HIGH (ref 100–199)
HDL: 70 mg/dL (ref >39)
LDL Chol Calc (NIH): 200 mg/dL — ABNORMAL HIGH (ref 0–99)
Triglycerides: 98 mg/dL (ref 0–149)
VLDL Cholesterol Cal: 17 mg/dL (ref 5–40)

## 2022-06-03 LAB — HEMOGLOBIN A1C
Est. average glucose Bld gHb Est-mCnc: 123 mg/dL
Hgb A1c MFr Bld: 5.9 % — ABNORMAL HIGH (ref 4.8–5.6)

## 2022-06-03 LAB — CBC WITH DIFFERENTIAL/PLATELET
Basophils Absolute: 0 10*3/uL (ref 0.0–0.2)
Basos: 0 %
EOS (ABSOLUTE): 0.1 10*3/uL (ref 0.0–0.4)
Eos: 2 %
Hematocrit: 37 % (ref 34.0–46.6)
Hemoglobin: 11.6 g/dL (ref 11.1–15.9)
Immature Grans (Abs): 0 10*3/uL (ref 0.0–0.1)
Immature Granulocytes: 0 %
Lymphocytes Absolute: 1.2 10*3/uL (ref 0.7–3.1)
Lymphs: 18 %
MCH: 28.2 pg (ref 26.6–33.0)
MCHC: 31.4 g/dL — ABNORMAL LOW (ref 31.5–35.7)
MCV: 90 fL (ref 79–97)
Monocytes Absolute: 0.4 10*3/uL (ref 0.1–0.9)
Monocytes: 6 %
Neutrophils Absolute: 4.8 10*3/uL (ref 1.4–7.0)
Neutrophils: 74 %
Platelets: 259 10*3/uL (ref 150–450)
RBC: 4.11 x10E6/uL (ref 3.77–5.28)
RDW: 13.1 % (ref 11.7–15.4)
WBC: 6.7 10*3/uL (ref 3.4–10.8)

## 2022-06-04 NOTE — Progress Notes (Signed)
Kidney function remains stable; continue to emphasize water as drink of choice with limited potassium and phosphorus in your diet to assist.  Cholesterol has increased. The 10-year ASCVD risk score (Arnett DK, et al., 2019) is: 22.1%   Values used to calculate the score:     Age: 60 years     Sex: Female     Is Non-Hispanic African American: Yes     Diabetic: No     Tobacco smoker: No     Systolic Blood Pressure: 468 mmHg     Is BP treated: Yes     HDL Cholesterol: 70 mg/dL     Total Cholesterol: 287 mg/dL Heart attack and stroke risk is 22% estimated within the next 10 years which is high. We can recalculate this percentage following your next blood pressure follow-up.  A1c remains stable in pre-diabetic range. Continue to recommend balanced, lower carb meals. Smaller meal size, adding snacks. Choosing water as drink of choice and increasing purposeful exercise.  Cell count is stable; this is reassuring given healing abdominal abscess.  Please make your mammogram appt if you haven't done so.  Gwyneth Sprout, [address] [place], [place] [address] [phone] (phone) [phone] (fax) [place]

## 2022-06-25 ENCOUNTER — Telehealth: Payer: Self-pay

## 2022-06-25 NOTE — Telephone Encounter (Signed)
Copied from [place] [id number]. Topic: General - Other >> Jun 24, 2022  4:47 PM Cyndi Bender wrote: Reason for CRM: Marcy Siren with [place] requests call back regarding patient progress notes. Cb# [phone]  Ref# [id number]

## 2022-07-23 IMAGING — US US RENAL
1 series · 14 of 25 positions shown · non-contrast
Comparison: Abdominal MRI on 01/21/2019

CLINICAL DATA: Chronic kidney disease, stage IV. History of
hyperlipidemia, hypertension and anemia.

EXAM:
RENAL / URINARY TRACT ULTRASOUND COMPLETE

[Series 1: us renal · 0.26mm/px · 14 of 28 slices shown]
[im 1/28]
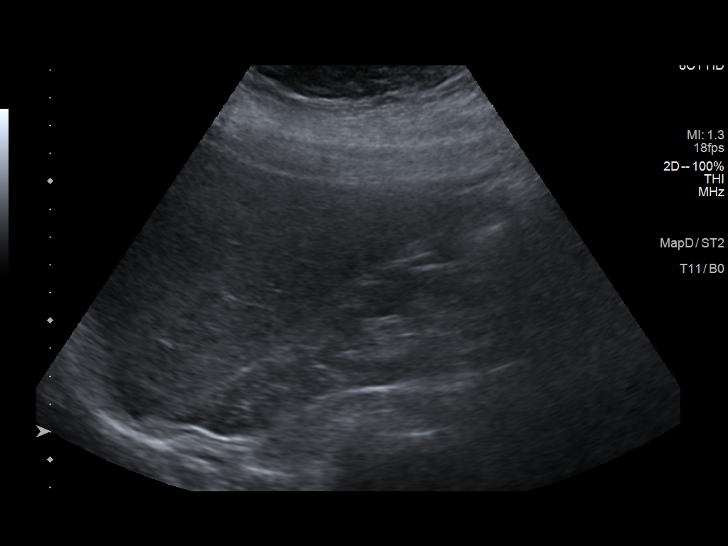
[im 3/28]
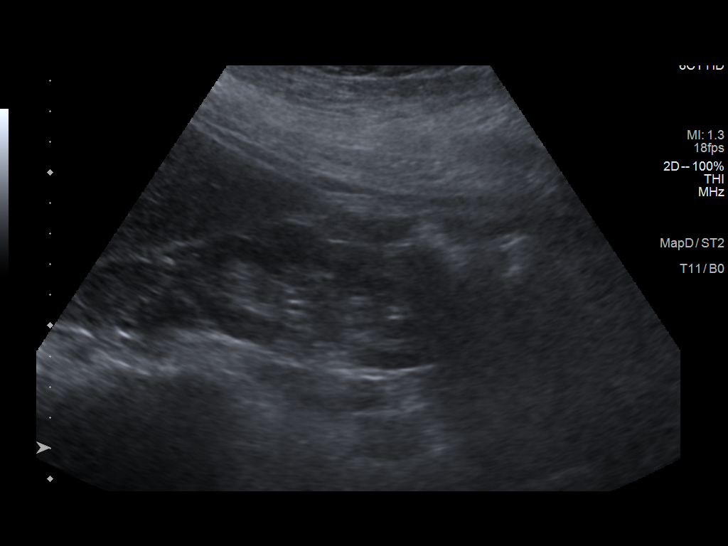
[im 5/28]
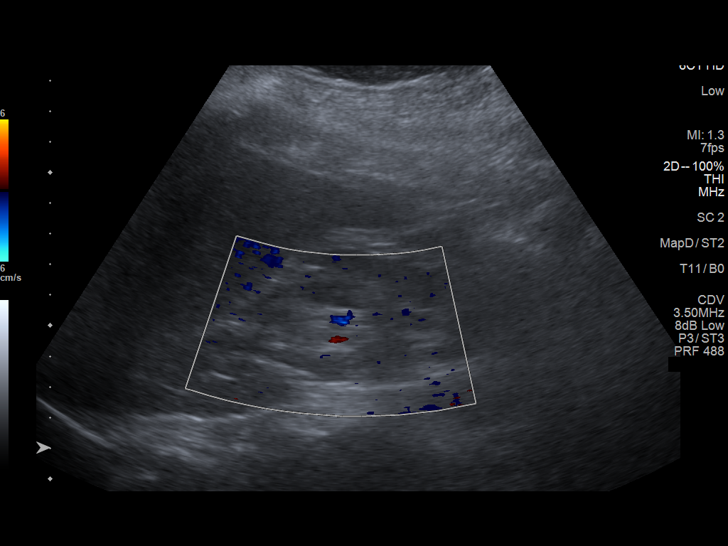
[im 7/28]
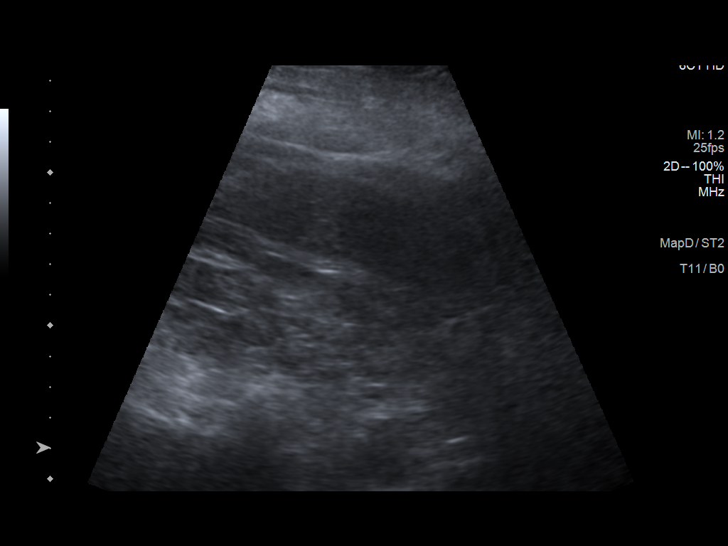
[im 10/28]
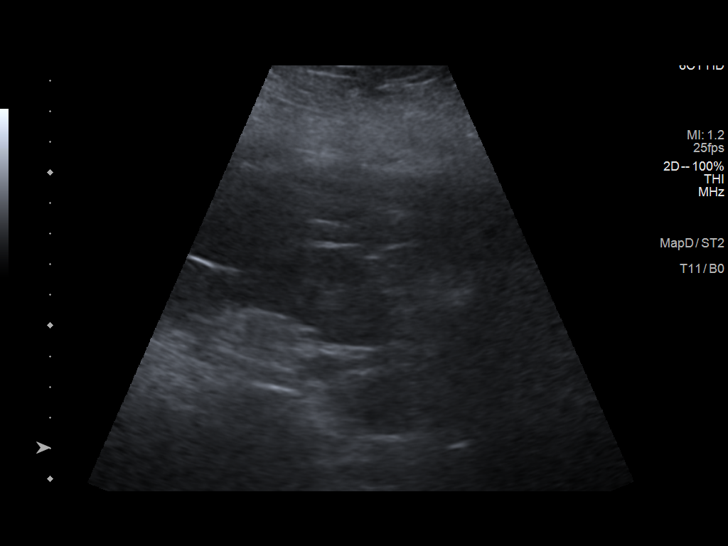
[im 11/28]
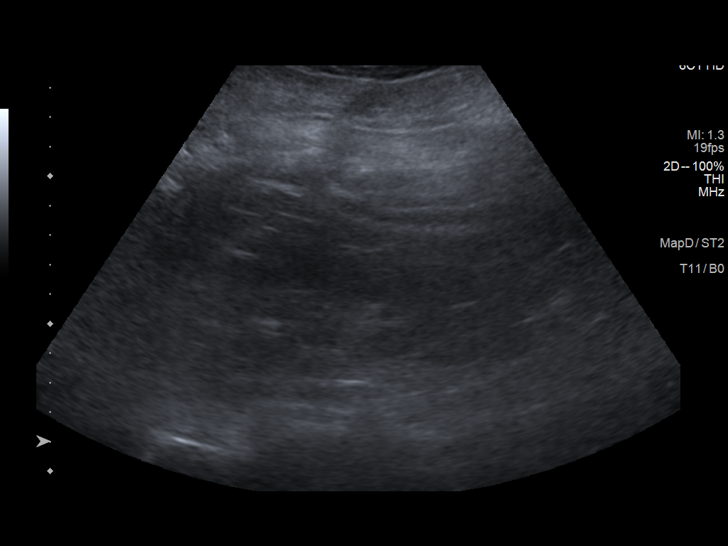
[im 13/28]
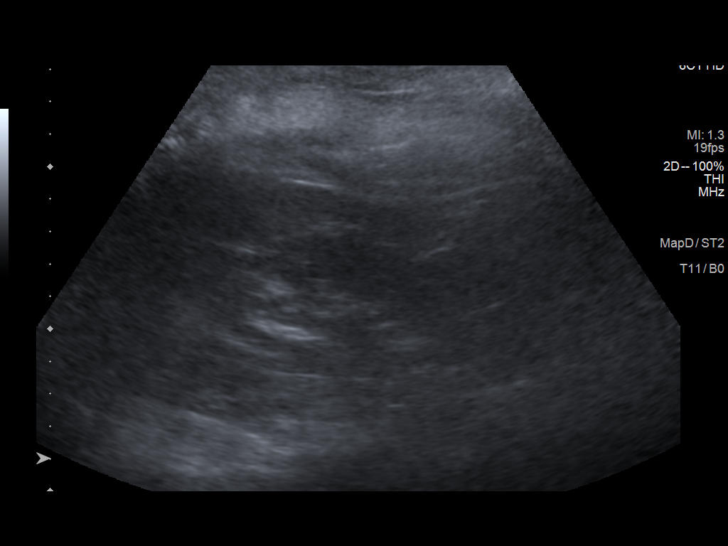
[im 15/28]
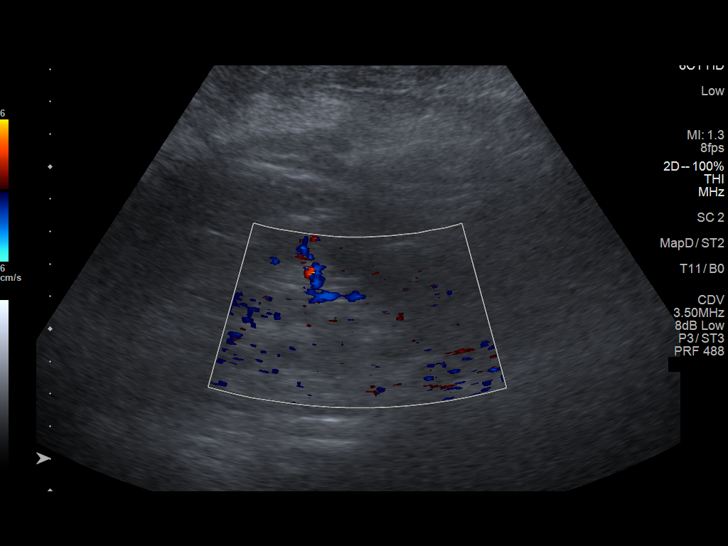
[im 17/28]
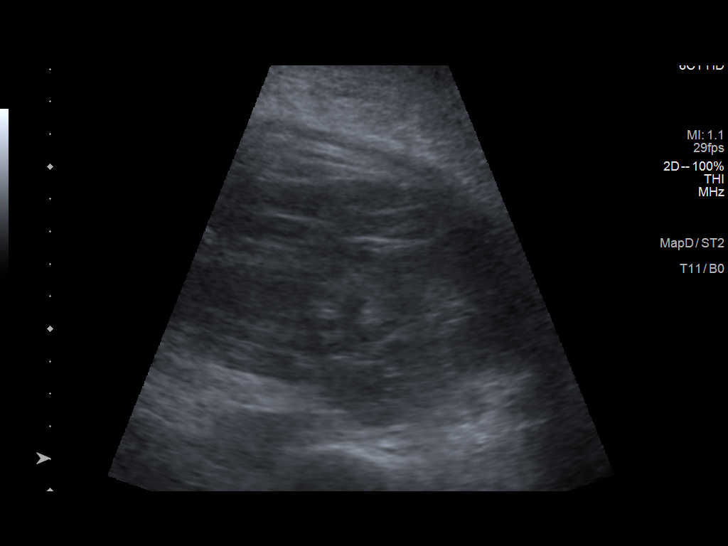
[im 19/28]
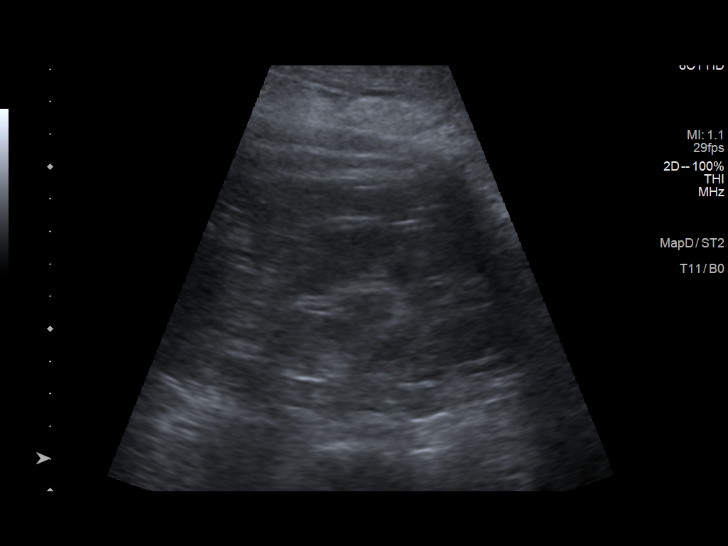
[im 21/28]
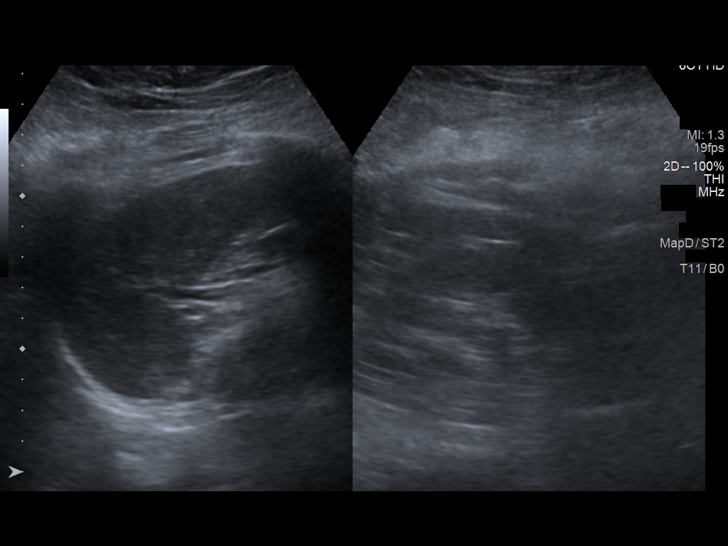
[im 23/28]
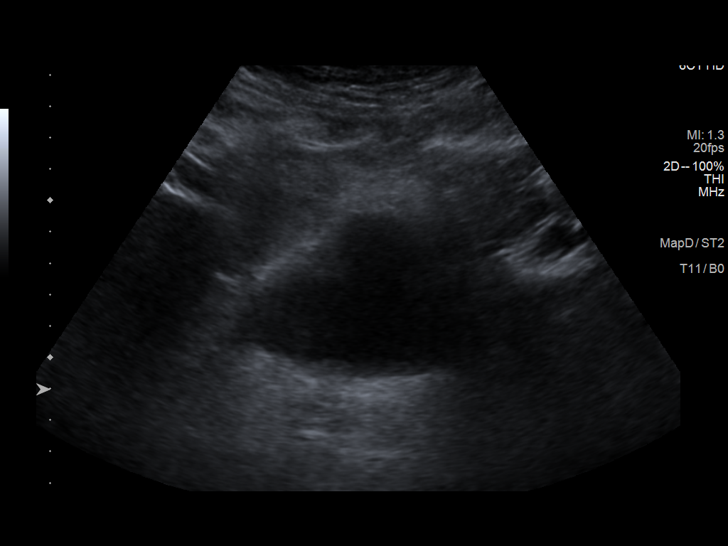
[im 25/28]
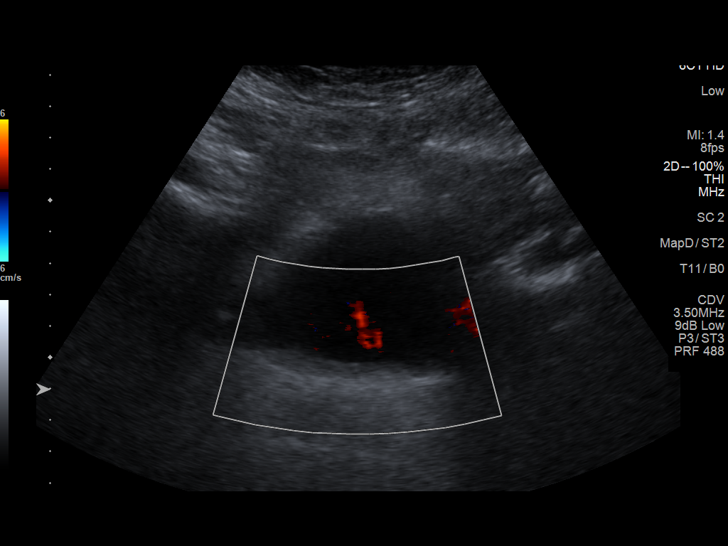
[im 28/28]
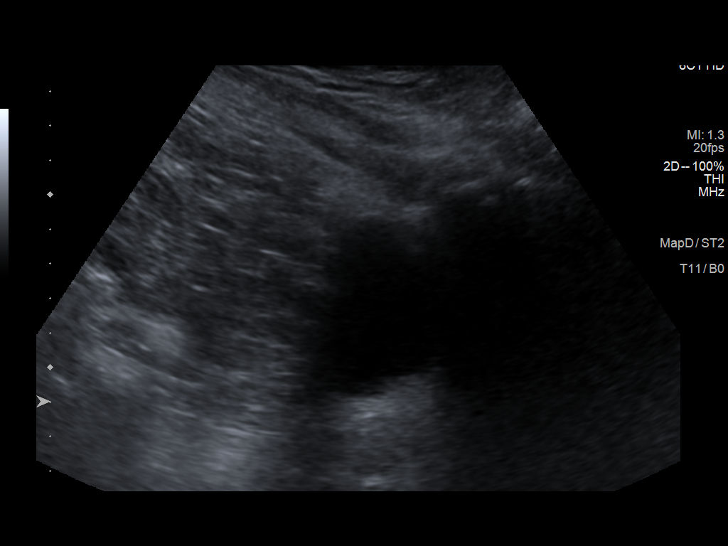

[14 of 25 positions shown; findings below may reference images not displayed]

FINDINGS: Right Kidney:

Renal measurements: 9.5 x 4.0 x 3.9 cm = volume: 79 mL. Mildly
atrophic renal cortex with mildly increased cortical echogenicity.
No focal masses or evidence of hydronephrosis.

Left Kidney:

Renal measurements: 9.1 x 4.4 x 4.1 cm = volume: 87 mL. Mildly
atrophic renal cortex with mildly increased cortical echogenicity.
No focal masses or evidence of hydronephrosis.

Bladder:

Appears normal for degree of bladder distention.

Other:

None.
IMPRESSION: Both kidneys demonstrate mild cortical atrophy and mildly increased
cortical echogenicity. Both are roughly equal in size and volume and
demonstrate no hydronephrosis.

## 2022-08-07 ENCOUNTER — Other Ambulatory Visit: Payer: Self-pay | Admitting: Family Medicine

## 2022-08-08 NOTE — Telephone Encounter (Signed)
Requested medication (s) are due for refill today:   No  Requested medication (s) are on the active medication list:   No  Future visit scheduled:   No   Last ordered: Not on medication list.    Returned because no mention of this drug in chart or on medication list.   Not sure where it came from.   Requested Prescriptions  Pending Prescriptions Disp Refills   gabapentin (NEURONTIN) 300 MG capsule [Pharmacy Med Name: GABAPENTIN 300 MG CAPSULE] 180 capsule 11    Sig: TAKE TAKE 2 CAPSULES BY MOUTH 3 TIMES A DAY     Neurology: Anticonvulsants - gabapentin Failed - 08/07/2022  7:47 PM      Failed - Cr in normal range and within 360 days    Creatinine, Ser  Date Value Ref Range Status  06/02/2022 1.86 (H) 0.57 - 1.00 mg/dL Final         Passed - Completed PHQ-2 or PHQ-9 in the last 360 days      Passed - Valid encounter within last 12 months    Recent Outpatient Visits           2 months ago Morbid obesity due to excess calories [facility])   [facility] Gwyneth Sprout, FNP   9 months ago Annual physical exam   [facility] Gwyneth Sprout, FNP   1 year ago Primary hypertension   [facility] Gwyneth Sprout, FNP   1 year ago Primary hypertension   [facility] Bacigal[place], Dionne Bucy, MD   1 year ago Primary hypertension   [place], Kelby Aline, FNP

## 2022-08-14 ENCOUNTER — Encounter: Payer: Self-pay | Admitting: Family Medicine

## 2022-08-15 MED ORDER — GABAPENTIN 300 MG PO CAPS: 600.0000 mg | ORAL_CAPSULE | Freq: Three times a day (TID) | ORAL | 0 refills | Status: DC

## 2022-09-06 ENCOUNTER — Other Ambulatory Visit: Payer: Self-pay | Admitting: Family Medicine

## 2022-09-06 DIAGNOSIS — F419 Anxiety disorder, unspecified: Secondary | ICD-10-CM

## 2022-09-18 ENCOUNTER — Telehealth: Payer: 59 | Admitting: Physician Assistant

## 2022-09-18 DIAGNOSIS — B9689 Other specified bacterial agents as the cause of diseases classified elsewhere: Secondary | ICD-10-CM

## 2022-09-18 DIAGNOSIS — J069 Acute upper respiratory infection, unspecified: Secondary | ICD-10-CM | POA: Diagnosis not present

## 2022-09-19 ENCOUNTER — Encounter: Payer: Self-pay | Admitting: Physician Assistant

## 2022-09-19 MED ORDER — AZITHROMYCIN 250 MG PO TABS: ORAL_TABLET | ORAL | 0 refills | Status: AC

## 2022-09-19 MED ORDER — BENZONATATE 100 MG PO CAPS: 100.0000 mg | ORAL_CAPSULE | Freq: Three times a day (TID) | ORAL | 0 refills | Status: DC | PRN

## 2022-09-19 NOTE — Progress Notes (Signed)
Duplicate encounter- marked erroneous

## 2022-09-19 NOTE — Progress Notes (Signed)
Duplicate encounter- mark erroneous

## 2022-09-19 NOTE — Telephone Encounter (Signed)
This encounter was created in error - please disregard.

## 2022-09-19 NOTE — Progress Notes (Signed)
We are sorry that you are not feeling well.  Here is how we plan to help!  Based on your presentation I believe you most likely have A cough due to bacteria.  When patients have a fever and a productive cough with a change in color or increased sputum production, we are concerned about bacterial bronchitis.  If left untreated it can progress to pneumonia.  If your symptoms do not improve with your treatment plan it is important that you contact your provider.   I have prescribed Azithromyin 250 mg: two tablets now and then one tablet daily for 4 additonal days    In addition you may use A non-prescription cough medication called Mucinex DM: take 2 tablets every 12 hours. and A prescription cough medication called Tessalon Perles 100mg. You may take 1-2 capsules every 8 hours as needed for your cough.   From your responses in the eVisit questionnaire you describe inflammation in the upper respiratory tract which is causing a significant cough.  This is commonly called Bronchitis and has four common causes:   Allergies Viral Infections Acid Reflux Bacterial Infection Allergies, viruses and acid reflux are treated by controlling symptoms or eliminating the cause. An example might be a cough caused by taking certain blood pressure medications. You stop the cough by changing the medication. Another example might be a cough caused by acid reflux. Controlling the reflux helps control the cough.  USE OF BRONCHODILATOR ("RESCUE") INHALERS: There is a risk from using your bronchodilator too frequently.  The risk is that over-reliance on a medication which only relaxes the muscles surrounding the breathing tubes can reduce the effectiveness of medications prescribed to reduce swelling and congestion of the tubes themselves.  Although you feel brief relief from the bronchodilator inhaler, your asthma may actually be worsening with the tubes becoming more swollen and filled with mucus.  This can delay other  crucial treatments, such as oral steroid medications. If you need to use a bronchodilator inhaler daily, several times per day, you should discuss this with your provider.  There are probably better treatments that could be used to keep your asthma under control.     HOME CARE Only take medications as instructed by your medical team. Complete the entire course of an antibiotic. Drink plenty of fluids and get plenty of rest. Avoid close contacts especially the very young and the elderly Cover your mouth if you cough or cough into your sleeve. Always remember to wash your hands A steam or ultrasonic humidifier can help congestion.   GET HELP RIGHT AWAY IF: You develop worsening fever. You become short of breath You cough up blood. Your symptoms persist after you have completed your treatment plan MAKE SURE YOU  Understand these instructions. Will watch your condition. Will get help right away if you are not doing well or get worse.    Thank you for choosing an e-visit.  Your e-visit answers were reviewed by a board certified advanced clinical practitioner to complete your personal care plan. Depending upon the condition, your plan could have included both over the counter or prescription medications.  Please review your pharmacy choice. Make sure the pharmacy is open so you can pick up prescription now. If there is a problem, you may contact your provider through MyChart messaging and have the prescription routed to another pharmacy.  Your safety is important to us. If you have drug allergies check your prescription carefully.   For the next 24 hours you can use   MyChart to ask questions about today's visit, request a non-urgent call back, or ask for a work or school excuse. You will get an email in the next two days asking about your experience. I hope that your e-visit has been valuable and will speed your recovery.  I have spent 5 minutes in review of e-visit questionnaire, review and  updating patient chart, medical decision making and response to patient.   Clements Toro M Dontarious Schaum, PA-C  

## 2022-09-22 ENCOUNTER — Other Ambulatory Visit: Payer: Self-pay | Admitting: Physician Assistant

## 2022-09-23 NOTE — Telephone Encounter (Signed)
Requested medication (s) are due for refill today: yes  Requested medication (s) are on the active medication list: yes  Last refill:  08/15/22 #180 0 refills  Future visit scheduled: no  Notes to clinic:  no refills remain, do you want to refill Rx?     Requested Prescriptions  Pending Prescriptions Disp Refills   gabapentin (NEURONTIN) 300 MG capsule [Pharmacy Med Name: GABAPENTIN 300 MG CAPSULE] 180 capsule 0    Sig: TAKE 2 CAPSULES BY MOUTH 3 TIMES DAILY.     Neurology: Anticonvulsants - gabapentin Failed - 09/22/2022 10:51 AM      Failed - Cr in normal range and within 360 days    Creatinine, Ser  Date Value Ref Range Status  06/02/2022 1.86 (H) 0.57 - 1.00 mg/dL Final         Passed - Completed PHQ-2 or PHQ-9 in the last 360 days      Passed - Valid encounter within last 12 months    Recent Outpatient Visits           3 months ago Morbid obesity due to excess calories [facility])   [facility] Gwyneth Sprout, FNP   11 months ago Annual physical exam   [facility] Gwyneth Sprout, FNP   1 year ago Primary hypertension   [facility] Gwyneth Sprout, FNP   1 year ago Primary hypertension   [facility] Bacigalupo, Dionne Bucy, MD   1 year ago Primary hypertension   [place], Kelby Aline, FNP

## 2022-09-27 ENCOUNTER — Encounter: Payer: Self-pay | Admitting: Family Medicine

## 2022-09-30 ENCOUNTER — Other Ambulatory Visit: Payer: Self-pay | Admitting: Family Medicine

## 2022-09-30 MED ORDER — GABAPENTIN 300 MG PO CAPS: 600.0000 mg | ORAL_CAPSULE | Freq: Three times a day (TID) | ORAL | 0 refills | Status: DC

## 2022-10-01 ENCOUNTER — Telehealth: Payer: Self-pay

## 2022-10-01 NOTE — Telephone Encounter (Signed)
   RN Visit Note   10/01/22 Name: Ashley Mccullough MRN: [id number]      DOB: 1961-06-07  Subjective: Ashley Mccullough is a 62 y.o. year old female who is a primary care patient of Gwyneth Sprout, FNP.    Message received regarding patient's request for home health physical therapy services d/t recent fall. Attempted to reach patient today. Outreach was unsuccessful. Unable to leave a voice message due to patient's voice mailbox being full.  PLAN: Will attempt to reach again this week.   Horris Latino RN Care Manager/Chronic Care Management [phone]

## 2022-10-03 NOTE — Telephone Encounter (Signed)
Virtual scheduled for Tuesday

## 2022-10-04 ENCOUNTER — Other Ambulatory Visit: Payer: Self-pay | Admitting: Family Medicine

## 2022-10-07 ENCOUNTER — Telehealth: Payer: 59 | Admitting: Family Medicine

## 2022-10-17 ENCOUNTER — Telehealth: Payer: Self-pay | Admitting: Family Medicine

## 2022-10-17 NOTE — Progress Notes (Unsigned)
I,Neisha Hinger R Antoino Westhoff,acting as a Education administrator for Gwyneth Sprout, FNP.,have documented all relevant documentation on the behalf of Gwyneth Sprout, FNP,as directed by  Gwyneth Sprout, FNP while in the presence of Gwyneth Sprout, FNP.  MyChart Video Visit  Virtual Visit via Video Note   This format is felt to be most appropriate for this patient at this time. Physical exam was limited by quality of the video and audio technology used for the visit.   Patient location: Home Provider location: [facility]  I discussed the limitations of evaluation and management by telemedicine and the availability of in person appointments. The patient expressed understanding and agreed to proceed.  Patient: Ashley Mccullough   DOB: September 02, 1961   62 y.o. Female  MRN: [id number] Visit Date: 10/20/2022  Today's healthcare provider: Gwyneth Sprout, FNP  Introduced to nurse practitioner role and practice setting.  All questions answered.  Discussed provider/patient relationship and expectations.  Subjective    HPI  Pt is requesting home health, patients family is concerned that she needs help showering and fixing food for herself.  Pt states she is in extreme pain from fall in the bathroom 3 weeks ago. This is the 3rd fall in last few months from her trying to complete her daily task by herself. She reports blood from her rectum and in her urine. She feels very weak.   Medications: Outpatient Medications Prior to Visit  Medication Sig   acetaminophen (TYLENOL) 650 MG CR tablet Take 650-1,300 mg by mouth at bedtime. As needed for arthritis.   benzonatate (TESSALON) 100 MG capsule Take 1 capsule (100 mg total) by mouth 3 (three) times daily as needed.   buPROPion (WELLBUTRIN XL) 150 MG 24 hr tablet Take 2 tablets (300 mg total) by mouth daily.   calcitRIOL (ROCALTROL) 0.25 MCG capsule TAKE 1 CAPSULE BY MOUTH 1 TIME EACH DAY   Cetirizine HCl 10 MG CAPS Take by mouth.   Cholecalciferol (VITAMIN D3) 5000  units TABS Take 5,000 tablets by mouth daily.   COLLAGEN PO Take by mouth.   FLUoxetine (PROZAC) 20 MG capsule Take 1 capsule (20 mg total) by mouth daily. Take once daily with 40 mg dose for total of 60 mg.   FLUoxetine (PROZAC) 40 MG capsule Take 1 capsule (40 mg total) by mouth daily. Take once daily with 20 mg dose for total of 60 mg.   gabapentin (NEURONTIN) 300 MG capsule TAKE TAKE 2 CAPSULES BY MOUTH 3 TIMES A DAY   gabapentin (NEURONTIN) 300 MG capsule Take 2 capsules (600 mg total) by mouth 3 (three) times daily.   hydrALAZINE (APRESOLINE) 10 MG tablet Take 1 tablet (10 mg total) by mouth 3 (three) times daily.   hydrOXYzine (VISTARIL) 25 MG capsule TAKE 1 CAPSULE (25 MG TOTAL) BY MOUTH EVERY 8 (EIGHT) HOURS AS NEEDED.   losartan-hydrochlorothiazide (HYZAAR) 100-25 MG tablet Take 1 tablet by mouth daily.   metoprolol succinate (TOPROL-XL) 25 MG 24 hr tablet TAKE 1 TABLET BY MOUTH DAILY   montelukast (SINGULAIR) 10 MG tablet Take 1 tablet (10 mg total) by mouth daily.   No facility-administered medications prior to visit.    Review of Systems   Objective    There were no vitals taken for this visit.  Has bought a BP cuff; however, does not know how to use.  Physical Exam Constitutional:      General: She is not in acute distress.    Appearance: Normal appearance. She is  obese. She is not ill-appearing.  Pulmonary:     Effort: Pulmonary effort is normal.  Skin:    General: Skin is dry.  Neurological:     General: No focal deficit present.     Mental Status: She is alert.  Psychiatric:        Mood and Affect: Mood normal.        Behavior: Behavior normal.        Thought Content: Thought content normal.        Judgment: Judgment normal.        Assessment & Plan     Problem List Items Addressed This Visit       Cardiovascular and Mediastinum   Hypertension associated with diabetes ([place])    Chronic, unknown- previously uncontrolled Goal <130/<80         Endocrine   Hyperlipidemia associated with type 2 diabetes mellitus (HCC)    Chronic, unknown Goal LDL <70        Genitourinary   Stage 3b chronic kidney disease ([place])    Endorses blood in urine Recommend emergent evaluation Hx of CKD last assessed >3 months ago        Other   Morbid obesity due to excess calories ([place])    Last known weight ~350#      Relevant Orders   Ambulatory referral to [place] history of fall    Reports 3 falls since 08/2022; will lay on floor "for hours" Lives alone       Relevant Orders   Ambulatory referral to [place] deficit for feeding, bathing, and toileting - Primary    Request for home health assistance; will need likely SNF vs ALF following inpatient evaluation Would benefit from apartment relocation to ground floor       Relevant Orders   Ambulatory referral to [place]   No follow-ups on file.    I discussed the assessment and treatment plan with the patient. The patient was provided an opportunity to ask questions and all were answered. The patient agreed with the plan and demonstrated an understanding of the instructions.   The patient was advised to call back or seek an in-person evaluation if the symptoms worsen or if the condition fails to improve as anticipated.  I provided 30 minutes of face-to-face time during this encounter discussing current concerns for weakness and bleeding following falls and coordinating care and transfer for inpatient evaluation. Discussion with both patient's sister and aunt to assist.  I, Gwyneth Sprout, FNP, have reviewed all documentation for this visit. The documentation on 10/20/22 for the exam, diagnosis, procedures, and orders are all accurate and complete.  Gwyneth Sprout, [place] [phone] (phone) [phone] (fax)  [place]

## 2022-10-17 NOTE — Telephone Encounter (Signed)
[employer] ID number- [id number] Patient is calling to report that she has a new insurance. And has virtual appt on Monday.

## 2022-10-20 ENCOUNTER — Emergency Department: Payer: HMO

## 2022-10-20 ENCOUNTER — Telehealth: Payer: Self-pay

## 2022-10-20 ENCOUNTER — Encounter: Payer: Self-pay | Admitting: Family Medicine

## 2022-10-20 ENCOUNTER — Other Ambulatory Visit: Payer: Self-pay

## 2022-10-20 ENCOUNTER — Telehealth (INDEPENDENT_AMBULATORY_CARE_PROVIDER_SITE_OTHER): Payer: HMO | Admitting: Family Medicine

## 2022-10-20 ENCOUNTER — Inpatient Hospital Stay
Admission: EM | Admit: 2022-10-20 | Discharge: 2022-10-30 | DRG: 394 | Disposition: A | Discharge status: Skilled Nursing Facility | Payer: HMO | Attending: Hospitalist | Admitting: Hospitalist

## 2022-10-20 DIAGNOSIS — Z882 Allergy status to sulfonamides status: Secondary | ICD-10-CM

## 2022-10-20 DIAGNOSIS — E785 Hyperlipidemia, unspecified: Secondary | ICD-10-CM | POA: Diagnosis present

## 2022-10-20 DIAGNOSIS — D509 Iron deficiency anemia, unspecified: Secondary | ICD-10-CM | POA: Diagnosis not present

## 2022-10-20 DIAGNOSIS — M7989 Other specified soft tissue disorders: Secondary | ICD-10-CM | POA: Diagnosis not present

## 2022-10-20 DIAGNOSIS — E669 Obesity, unspecified: Secondary | ICD-10-CM | POA: Diagnosis not present

## 2022-10-20 DIAGNOSIS — F339 Major depressive disorder, recurrent, unspecified: Secondary | ICD-10-CM | POA: Diagnosis not present

## 2022-10-20 DIAGNOSIS — E8721 Acute metabolic acidosis: Secondary | ICD-10-CM | POA: Diagnosis not present

## 2022-10-20 DIAGNOSIS — K649 Unspecified hemorrhoids: Secondary | ICD-10-CM | POA: Diagnosis not present

## 2022-10-20 DIAGNOSIS — K921 Melena: Secondary | ICD-10-CM | POA: Diagnosis not present

## 2022-10-20 DIAGNOSIS — W19XXXA Unspecified fall, initial encounter: Secondary | ICD-10-CM | POA: Diagnosis not present

## 2022-10-20 DIAGNOSIS — Z8249 Family history of ischemic heart disease and other diseases of the circulatory system: Secondary | ICD-10-CM

## 2022-10-20 DIAGNOSIS — K5790 Diverticulosis of intestine, part unspecified, without perforation or abscess without bleeding: Secondary | ICD-10-CM | POA: Diagnosis not present

## 2022-10-20 DIAGNOSIS — Z803 Family history of malignant neoplasm of breast: Secondary | ICD-10-CM

## 2022-10-20 DIAGNOSIS — Z7401 Bed confinement status: Secondary | ICD-10-CM | POA: Diagnosis not present

## 2022-10-20 DIAGNOSIS — E1169 Type 2 diabetes mellitus with other specified complication: Secondary | ICD-10-CM | POA: Insufficient documentation

## 2022-10-20 DIAGNOSIS — F411 Generalized anxiety disorder: Secondary | ICD-10-CM | POA: Diagnosis not present

## 2022-10-20 DIAGNOSIS — E1122 Type 2 diabetes mellitus with diabetic chronic kidney disease: Secondary | ICD-10-CM | POA: Diagnosis not present

## 2022-10-20 DIAGNOSIS — N184 Chronic kidney disease, stage 4 (severe): Secondary | ICD-10-CM

## 2022-10-20 DIAGNOSIS — M5137 Other intervertebral disc degeneration, lumbosacral region: Secondary | ICD-10-CM | POA: Diagnosis not present

## 2022-10-20 DIAGNOSIS — E875 Hyperkalemia: Secondary | ICD-10-CM | POA: Diagnosis not present

## 2022-10-20 DIAGNOSIS — M25551 Pain in right hip: Secondary | ICD-10-CM | POA: Diagnosis present

## 2022-10-20 DIAGNOSIS — G894 Chronic pain syndrome: Secondary | ICD-10-CM | POA: Diagnosis not present

## 2022-10-20 DIAGNOSIS — M5442 Lumbago with sciatica, left side: Secondary | ICD-10-CM | POA: Diagnosis not present

## 2022-10-20 DIAGNOSIS — F32A Depression, unspecified: Secondary | ICD-10-CM | POA: Diagnosis present

## 2022-10-20 DIAGNOSIS — Z818 Family history of other mental and behavioral disorders: Secondary | ICD-10-CM

## 2022-10-20 DIAGNOSIS — G8929 Other chronic pain: Secondary | ICD-10-CM

## 2022-10-20 DIAGNOSIS — D631 Anemia in chronic kidney disease: Secondary | ICD-10-CM | POA: Diagnosis not present

## 2022-10-20 DIAGNOSIS — Z6841 Body Mass Index (BMI) 40.0 and over, adult: Secondary | ICD-10-CM | POA: Diagnosis not present

## 2022-10-20 DIAGNOSIS — N1832 Chronic kidney disease, stage 3b: Secondary | ICD-10-CM | POA: Diagnosis not present

## 2022-10-20 DIAGNOSIS — K625 Hemorrhage of anus and rectum: Secondary | ICD-10-CM | POA: Diagnosis not present

## 2022-10-20 DIAGNOSIS — M48062 Spinal stenosis, lumbar region with neurogenic claudication: Secondary | ICD-10-CM | POA: Diagnosis not present

## 2022-10-20 DIAGNOSIS — M25552 Pain in left hip: Secondary | ICD-10-CM | POA: Diagnosis not present

## 2022-10-20 DIAGNOSIS — E1159 Type 2 diabetes mellitus with other circulatory complications: Secondary | ICD-10-CM | POA: Diagnosis not present

## 2022-10-20 DIAGNOSIS — R262 Difficulty in walking, not elsewhere classified: Secondary | ICD-10-CM | POA: Diagnosis present

## 2022-10-20 DIAGNOSIS — R2681 Unsteadiness on feet: Secondary | ICD-10-CM | POA: Diagnosis not present

## 2022-10-20 DIAGNOSIS — D508 Other iron deficiency anemias: Secondary | ICD-10-CM | POA: Diagnosis not present

## 2022-10-20 DIAGNOSIS — G4733 Obstructive sleep apnea (adult) (pediatric): Secondary | ICD-10-CM | POA: Diagnosis not present

## 2022-10-20 DIAGNOSIS — M545 Low back pain, unspecified: Secondary | ICD-10-CM

## 2022-10-20 DIAGNOSIS — R531 Weakness: Secondary | ICD-10-CM | POA: Diagnosis not present

## 2022-10-20 DIAGNOSIS — D649 Anemia, unspecified: Secondary | ICD-10-CM | POA: Diagnosis not present

## 2022-10-20 DIAGNOSIS — Z91018 Allergy to other foods: Secondary | ICD-10-CM

## 2022-10-20 DIAGNOSIS — Z91040 Latex allergy status: Secondary | ICD-10-CM

## 2022-10-20 DIAGNOSIS — K640 First degree hemorrhoids: Secondary | ICD-10-CM | POA: Diagnosis not present

## 2022-10-20 DIAGNOSIS — N179 Acute kidney failure, unspecified: Secondary | ICD-10-CM

## 2022-10-20 DIAGNOSIS — I129 Hypertensive chronic kidney disease with stage 1 through stage 4 chronic kidney disease, or unspecified chronic kidney disease: Secondary | ICD-10-CM | POA: Diagnosis not present

## 2022-10-20 DIAGNOSIS — R29898 Other symptoms and signs involving the musculoskeletal system: Secondary | ICD-10-CM | POA: Diagnosis not present

## 2022-10-20 DIAGNOSIS — N170 Acute kidney failure with tubular necrosis: Secondary | ICD-10-CM | POA: Diagnosis not present

## 2022-10-20 DIAGNOSIS — M5441 Lumbago with sciatica, right side: Secondary | ICD-10-CM | POA: Diagnosis not present

## 2022-10-20 DIAGNOSIS — I152 Hypertension secondary to endocrine disorders: Secondary | ICD-10-CM

## 2022-10-20 DIAGNOSIS — Z741 Need for assistance with personal care: Secondary | ICD-10-CM

## 2022-10-20 DIAGNOSIS — Z043 Encounter for examination and observation following other accident: Secondary | ICD-10-CM | POA: Diagnosis not present

## 2022-10-20 DIAGNOSIS — Z881 Allergy status to other antibiotic agents status: Secondary | ICD-10-CM

## 2022-10-20 DIAGNOSIS — K922 Gastrointestinal hemorrhage, unspecified: Secondary | ICD-10-CM | POA: Diagnosis not present

## 2022-10-20 DIAGNOSIS — M79604 Pain in right leg: Secondary | ICD-10-CM | POA: Diagnosis not present

## 2022-10-20 DIAGNOSIS — Z9181 History of falling: Secondary | ICD-10-CM

## 2022-10-20 DIAGNOSIS — Z833 Family history of diabetes mellitus: Secondary | ICD-10-CM

## 2022-10-20 DIAGNOSIS — M6281 Muscle weakness (generalized): Secondary | ICD-10-CM | POA: Diagnosis not present

## 2022-10-20 DIAGNOSIS — D62 Acute posthemorrhagic anemia: Secondary | ICD-10-CM | POA: Diagnosis present

## 2022-10-20 DIAGNOSIS — R58 Hemorrhage, not elsewhere classified: Secondary | ICD-10-CM | POA: Diagnosis not present

## 2022-10-20 DIAGNOSIS — Z888 Allergy status to other drugs, medicaments and biological substances status: Secondary | ICD-10-CM

## 2022-10-20 DIAGNOSIS — Z90710 Acquired absence of both cervix and uterus: Secondary | ICD-10-CM

## 2022-10-20 DIAGNOSIS — M549 Dorsalgia, unspecified: Secondary | ICD-10-CM | POA: Diagnosis not present

## 2022-10-20 DIAGNOSIS — R32 Unspecified urinary incontinence: Secondary | ICD-10-CM | POA: Diagnosis present

## 2022-10-20 DIAGNOSIS — I10 Essential (primary) hypertension: Secondary | ICD-10-CM | POA: Diagnosis not present

## 2022-10-20 DIAGNOSIS — Z79899 Other long term (current) drug therapy: Secondary | ICD-10-CM

## 2022-10-20 LAB — CBC WITH DIFFERENTIAL/PLATELET
Abs Immature Granulocytes: 0.03 10*3/uL (ref 0.00–0.07)
Basophils Absolute: 0 10*3/uL (ref 0.0–0.1)
Basophils Relative: 0 %
Eosinophils Absolute: 0.3 10*3/uL (ref 0.0–0.5)
Eosinophils Relative: 3 %
HCT: 30.7 % — ABNORMAL LOW (ref 36.0–46.0)
Hemoglobin: 9.5 g/dL — ABNORMAL LOW (ref 12.0–15.0)
Immature Granulocytes: 0 %
Lymphocytes Relative: 13 %
Lymphs Abs: 1.1 10*3/uL (ref 0.7–4.0)
MCH: 29 pg (ref 26.0–34.0)
MCHC: 30.9 g/dL (ref 30.0–36.0)
MCV: 93.6 fL (ref 80.0–100.0)
Monocytes Absolute: 0.6 10*3/uL (ref 0.1–1.0)
Monocytes Relative: 7 %
Neutro Abs: 6.4 10*3/uL (ref 1.7–7.7)
Neutrophils Relative %: 77 %
Platelets: 199 10*3/uL (ref 150–400)
RBC: 3.28 MIL/uL — ABNORMAL LOW (ref 3.87–5.11)
RDW: 15.6 % — ABNORMAL HIGH (ref 11.5–15.5)
WBC: 8.4 10*3/uL (ref 4.0–10.5)
nRBC: 0 % (ref 0.0–0.2)

## 2022-10-20 LAB — COMPREHENSIVE METABOLIC PANEL
ALT: 16 U/L (ref 0–44)
AST: 20 U/L (ref 15–41)
Albumin: 3.1 g/dL — ABNORMAL LOW (ref 3.5–5.0)
Alkaline Phosphatase: 43 U/L (ref 38–126)
Anion gap: 8 (ref 5–15)
BUN: 31 mg/dL — ABNORMAL HIGH (ref 8–23)
CO2: 18 mmol/L — ABNORMAL LOW (ref 22–32)
Calcium: 8.5 mg/dL — ABNORMAL LOW (ref 8.9–10.3)
Chloride: 109 mmol/L (ref 98–111)
Creatinine, Ser: 2.46 mg/dL — ABNORMAL HIGH (ref 0.44–1.00)
GFR, Estimated: 22 mL/min — ABNORMAL LOW (ref >60)
Glucose, Bld: 79 mg/dL (ref 70–99)
Potassium: 4.8 mmol/L (ref 3.5–5.1)
Sodium: 135 mmol/L (ref 135–145)
Total Bilirubin: 0.9 mg/dL (ref 0.3–1.2)
Total Protein: 6.5 g/dL (ref 6.5–8.1)

## 2022-10-20 LAB — FERRITIN: Ferritin: 80 ng/mL (ref 11–307)

## 2022-10-20 LAB — URINALYSIS, ROUTINE W REFLEX MICROSCOPIC
Bacteria, UA: NONE SEEN
Bilirubin Urine: NEGATIVE
Glucose, UA: NEGATIVE mg/dL
Hgb urine dipstick: NEGATIVE
Ketones, ur: NEGATIVE mg/dL
Leukocytes,Ua: NEGATIVE
Nitrite: NEGATIVE
Protein, ur: 100 mg/dL — AB
Specific Gravity, Urine: 1.014 (ref 1.005–1.030)
pH: 5 (ref 5.0–8.0)

## 2022-10-20 LAB — IRON AND TIBC
Iron: 62 ug/dL (ref 28–170)
Saturation Ratios: 24 % (ref 10.4–31.8)
TIBC: 262 ug/dL (ref 250–450)
UIBC: 200 ug/dL

## 2022-10-20 MED ORDER — METOPROLOL SUCCINATE ER 25 MG PO TB24
25.0000 mg | ORAL_TABLET | Freq: Every day | ORAL | Status: DC
  Administered 2022-10-21 – 2022-10-30 (×10): 25 mg via ORAL
  Filled 2022-10-20 (×10): qty 1

## 2022-10-20 MED ORDER — OXYCODONE-ACETAMINOPHEN 5-325 MG PO TABS
1.0000 | ORAL_TABLET | Freq: Once | ORAL | Status: AC
  Administered 2022-10-20: 1 via ORAL
  Filled 2022-10-20: qty 1

## 2022-10-20 MED ORDER — FLUOXETINE HCL 40 MG PO CAPS: 40.0000 mg | ORAL_CAPSULE | Freq: Every day | ORAL | Status: DC

## 2022-10-20 MED ORDER — SODIUM CHLORIDE 0.9 % IV SOLN: INTRAVENOUS | Status: DC

## 2022-10-20 MED ORDER — FLUOXETINE HCL 20 MG PO CAPS
60.0000 mg | ORAL_CAPSULE | Freq: Every day | ORAL | Status: DC
  Administered 2022-10-21 – 2022-10-30 (×10): 60 mg via ORAL
  Filled 2022-10-20 (×10): qty 3

## 2022-10-20 MED ORDER — ACETAMINOPHEN 650 MG RE SUPP: 650.0000 mg | Freq: Four times a day (QID) | RECTAL | Status: DC | PRN

## 2022-10-20 MED ORDER — CALCITRIOL 0.25 MCG PO CAPS
0.2500 ug | ORAL_CAPSULE | Freq: Every day | ORAL | Status: DC
  Administered 2022-10-21 – 2022-10-30 (×10): 0.25 ug via ORAL
  Filled 2022-10-20 (×10): qty 1

## 2022-10-20 MED ORDER — ONDANSETRON HCL 4 MG PO TABS: 4.0000 mg | ORAL_TABLET | Freq: Four times a day (QID) | ORAL | Status: DC | PRN

## 2022-10-20 MED ORDER — BUPROPION HCL ER (XL) 300 MG PO TB24
300.0000 mg | ORAL_TABLET | Freq: Every day | ORAL | Status: DC
  Administered 2022-10-21 – 2022-10-30 (×10): 300 mg via ORAL
  Filled 2022-10-20 (×5): qty 1
  Filled 2022-10-20: qty 2
  Filled 2022-10-20 (×4): qty 1

## 2022-10-20 MED ORDER — HYDRALAZINE HCL 10 MG PO TABS
10.0000 mg | ORAL_TABLET | Freq: Three times a day (TID) | ORAL | Status: DC
  Administered 2022-10-21 – 2022-10-29 (×27): 10 mg via ORAL
  Filled 2022-10-20 (×27): qty 1

## 2022-10-20 MED ORDER — LOSARTAN POTASSIUM-HCTZ 100-25 MG PO TABS: 1.0000 | ORAL_TABLET | Freq: Every day | ORAL | Status: DC

## 2022-10-20 MED ORDER — ACETAMINOPHEN 325 MG PO TABS
650.0000 mg | ORAL_TABLET | Freq: Four times a day (QID) | ORAL | Status: DC | PRN
  Administered 2022-10-21 – 2022-10-27 (×4): 650 mg via ORAL
  Filled 2022-10-20 (×4): qty 2

## 2022-10-20 MED ORDER — HYDROXYZINE HCL 10 MG PO TABS
25.0000 mg | ORAL_TABLET | Freq: Three times a day (TID) | ORAL | Status: DC | PRN
  Administered 2022-10-23 – 2022-10-27 (×7): 25 mg via ORAL
  Filled 2022-10-20 (×8): qty 3

## 2022-10-20 MED ORDER — GABAPENTIN 300 MG PO CAPS
600.0000 mg | ORAL_CAPSULE | Freq: Three times a day (TID) | ORAL | Status: DC
  Administered 2022-10-21 – 2022-10-25 (×14): 600 mg via ORAL
  Filled 2022-10-20 (×14): qty 2

## 2022-10-20 MED ORDER — ONDANSETRON HCL 4 MG/2ML IJ SOLN: 4.0000 mg | Freq: Four times a day (QID) | INTRAMUSCULAR | Status: DC | PRN

## 2022-10-20 NOTE — Assessment & Plan Note (Signed)
Continue fluoxetine, hydroxyzine and bupropion

## 2022-10-20 NOTE — Telephone Encounter (Signed)
Copied from [place]. Topic: General - Other >> Oct 20, 2022  2:47 PM Ja-Kwan M wrote: Reason for CRM: Pt sister [name] requests to be added to the [facility] virtual visit. [place] requests that the provider call her at [phone]

## 2022-10-20 NOTE — Assessment & Plan Note (Addendum)
Metabolic acidosis Creatinine 2.46 up from baseline of 1.86 a few months prior with bicarb of 18 Today's creatinine 2.28 and bicarb of 22

## 2022-10-20 NOTE — Assessment & Plan Note (Signed)
CPAP if desired 

## 2022-10-20 NOTE — ED Notes (Signed)
Xray in room at this time.

## 2022-10-20 NOTE — Assessment & Plan Note (Addendum)
Symptomatic anemia Last hemoglobin 8.6.  Colonoscopy shows hemorrhoids.  GI signed off.  IV Venofer given on 2/10.

## 2022-10-20 NOTE — ED Provider Notes (Signed)
[facility] Provider Note    Event Date/Time   First MD Initiated Contact with Patient 10/20/22 1916     (approximate)   History   Hip Pain (Patient is here today for bilateral hip pain that radiates down both her legs; She reports 2 falls in the last 2 months and states that she has decreased mobility due to the pain and can only get up to go to the bathroom; Patient also reports that since her last fall, she noticed blood and blood cloths in her stool) and Rectal Bleeding   HPI  Ashley Mccullough is a 62 y.o. female past with history of hypertension hyperlipidemia diabetes who presents with back/pelvic pain and inability to ambulate.  Patient tells me that she has chronic pain in the low back that radiates down bilateral lower extremities.  However since Thanksgiving she has had 2 falls 1 of which was just before Thanksgiving and 1 of which was last week.  Since that time her pain is worsened.  Pain radiates from the pelvic region down bilateral lower thighs to the mid thigh not to the toes.  She denies associated numbness tingling or weakness.  She is chronically incontinent of urine and has had some bowel incontinence this started when she had influenza several weeks ago and she is still having some incontinence but it is really only when she cannot get to the toilet in time.  Patient because of her pain is now barely able to ambulate.  She is not able to get down stairs and lives in a third floor apartment but does not have an elevator.  Because of this she has not been able to get to physical therapy which she was previously prescribed and has not been getting to appointments.  She is barely able to take care of herself.  She is also endorsing rectal bleeding since Thanksgiving.  She has bright red blood fills the toilet not mixed in with stool and it is pretty much with every bowel movement.  Did not have any episodes today.  Denies other bleeding. Patient had  televisit with her primary doctor who told her to come to the emergency department.    Past Medical History:  Diagnosis Date   Allergy    Anxiety    Depression    Hyperlipidemia    Hypertension     Patient Active Problem List   Diagnosis Date Noted   Self-care deficit for feeding, bathing, and toileting 10/20/2022   Personal history of fall 10/20/2022   Stage 3b chronic kidney disease ([place]) 10/20/2022   Hypertension associated with diabetes ([place]) 10/20/2022   Hyperlipidemia associated with type 2 diabetes mellitus ([place]) 10/20/2022   Panniculitis 06/02/2022   Need for influenza vaccination 06/02/2022   Mixed hyperlipidemia 06/02/2022   Anxiety 06/02/2022   Annual physical exam 10/17/2021   Depression, major, single episode, severe ([place]) 10/17/2021   Chronic kidney disease (CKD) stage G4/A1, severely decreased glomerular filtration rate (GFR) between 15-29 mL/min/1.73 square meter and albuminuria creatinine ratio less than 30 mg/g (HCC) 10/17/2021   CKD (chronic kidney disease) stage 4, GFR 15-29 ml/min ([place]) 06/06/2021   Hypersomnia 06/05/2021   Lumbar radiculitis 06/05/2021   Encounter for screening mammogram for malignant neoplasm of breast 06/05/2021   Chronic pain syndrome 04/11/2021   Bilateral primary osteoarthritis of knee 04/11/2021   Lumbar spondylosis 04/11/2021   Sacroiliac joint pain 04/11/2021   Chronic pain of both knees 06/21/2020   Chronic radicular lumbar pain 11/18/2019  Spinal stenosis of lumbar region with neurogenic claudication 11/18/2019   DDD (degenerative disc disease), lumbosacral 11/18/2019   Mild episode of recurrent major depressive disorder ([place]) 10/26/2018   BMI 60.0-69.9, adult ([place]) 07/01/2017   Morbid obesity due to excess calories ([place]) 12/24/2015   Anxiety, generalized 05/29/2015   D (diarrhea) 05/29/2015   Avitaminosis D 05/29/2015   Prediabetes 05/29/2015   Fatigue 05/29/2015   Cannot sleep 11/13/2009   Congenital pes planus  03/13/2008   Hypercholesterolemia without hypertriglyceridemia 09/16/2006     Physical Exam  Triage Vital Signs: ED Triage Vitals  Enc Vitals Group     BP 10/20/22 1644 (!) 157/92     Pulse Rate 10/20/22 1644 83     Resp 10/20/22 1644 19     Temp 10/20/22 1644 (!) 97.4 F (36.3 C)     Temp Source 10/20/22 1644 Oral     SpO2 10/20/22 1644 94 %     Weight 10/20/22 1641 (!) 350 lb (158.8 kg)     Height 10/20/22 1641 5\' 3"  (1.6 m)     Head Circumference --      Peak Flow --      Pain Score 10/20/22 1640 10     Pain Loc --      Pain Edu? --      Excl. in [place]? --     Most recent vital signs: Vitals:   10/20/22 2041 10/20/22 2301  BP: (!) 186/77 138/70  Pulse: 74 76  Resp: 20 16  Temp: (!) 97.5 F (36.4 C)   SpO2: 97% 99%     General: Awake, no distress.  Patient is obese CV:  Good peripheral perfusion. No pitting edema Resp:  Normal effort. No increased wob Abd:  No distention. soft Neuro:             Awake, Alert, Oriented x 3  Other:  Patient has 5 out of 5 strength with hip flexion, plantarflexion dorsiflexion, sensation grossly intact in bilateral lower extremities No gross blood on rectal exam   ED Results / Procedures / Treatments  Labs (all labs ordered are listed, but only abnormal results are displayed) Labs Reviewed  CBC WITH DIFFERENTIAL/PLATELET - Abnormal; Notable for the following components:      Result Value   RBC 3.28 (*)    Hemoglobin 9.5 (*)    HCT 30.7 (*)    RDW 15.6 (*)    All other components within normal limits  URINALYSIS, ROUTINE W REFLEX MICROSCOPIC - Abnormal; Notable for the following components:   Color, Urine YELLOW (*)    APPearance CLEAR (*)    Protein, ur 100 (*)    All other components within normal limits  COMPREHENSIVE METABOLIC PANEL - Abnormal; Notable for the following components:   CO2 18 (*)    BUN 31 (*)    Creatinine, Ser 2.46 (*)    Calcium 8.5 (*)    Albumin 3.1 (*)    GFR, Estimated 22 (*)    All other  components within normal limits  IRON AND TIBC  FERRITIN     EKG     RADIOLOGY    PROCEDURES:  Critical Care performed: No  Procedures  The patient is on the cardiac monitor to evaluate for evidence of arrhythmia and/or significant heart rate changes.   MEDICATIONS ORDERED IN ED: Medications  oxyCODONE-acetaminophen (PERCOCET/ROXICET) 5-325 MG per tablet 1 tablet (1 tablet Oral Given 10/20/22 1958)     IMPRESSION / MDM / ASSESSMENT AND PLAN /  ED COURSE  I reviewed the triage vital signs and the nursing notes.                              Patient's presentation is most consistent with acute complicated illness / injury requiring diagnostic workup.  Differential diagnosis includes, but is not limited to, pelvic fracture, lumbar fracture, lumbar radiculopathy, less likely cauda equina syndrome  The patient is a 62 year old female with morbidly obese presents because of worsening back pain inability to ambulate as well as rectal bleeding.  Patient has had 2 falls since Thanksgiving and her chronic low back pain is now worse to the point that she is not able to ambulate.  She lives on the third floor apartment and cannot leave the house as a result.  Pain radiates from the pelvic region down the bilateral back of the legs to the mid thigh.  She has no associated numbness tingling weakness but she is chronically incontinent of urine has had some intermittent incontinence of stool but mainly because she cannot get to the bathroom in time.  She does endorse rectal bleeding as well since Thanksgiving has had bright red blood not really mixed in with stool.  Denies abdominal pain fevers chills.  On exam patient is obese she does have good strength in her lower extremities with normal rectal tone.  No gross blood on rectal exam rectal vault essentially is empty.  Her labs do note a drop in hemoglobin to 9.5 from around 11 4 months ago.  She is also worsening renal function with creatinine  2.46 from around 1.8 baseline.  Pelvis x-ray does not have any acute fracture.  I do think patient would benefit from an MRI of her lumbar spine.  She has not done well at home and is not able to care for herself due to pain and difficulty ambulating.  I suspect her obesity is in part contributing to this.  I do think that she will need admission or to see social work for placement.  I have ordered the MRI.  Plan to discussed with the hospitalist.       FINAL CLINICAL IMPRESSION(S) / ED DIAGNOSES   Final diagnoses:  Hematochezia  Low back pain, unspecified back pain laterality, unspecified chronicity, unspecified whether sciatica present     Rx / DC Orders   ED Discharge Orders     None        Note:  This document was prepared using Dragon voice recognition software and may include unintentional dictation errors.   Rada Hay, MD 10/20/22 [phone]

## 2022-10-20 NOTE — H&P (Signed)
History and Physical    Patient: Ashley Mccullough DVV:[id number] DOB: [birth year] DOA: 10/20/2022 DOS: the patient was seen and examined on 10/20/2022 PCP: Gwyneth Sprout, FNP  Patient coming from: Home  Chief Complaint:  Chief Complaint  Patient presents with   Hip Pain    Patient is here today for bilateral hip pain that radiates down both her legs; She reports 2 falls in the last 2 months and states that she has decreased mobility due to the pain and can only get up to go to the bathroom; Patient also reports that since her last fall, she noticed blood and blood cloths in her stool   Rectal Bleeding    HPI: Ashley Mccullough is a 62 y.o. female with medical history significant for CKD 4 with anemia of CKD, obesity with BMI over 60, OSA, depression, chronic back pain due to spinal stenosis with neurogenic claudication, who presents to the ED with complaints of protracted weakness over the past several weeks, increasing fatigue, shortness of breath with inability to walk without assistance as well as worsening of her chronic back pain.  Weakness has resulted in a few falls.  Is becoming increasingly difficult to manage on her own as she lives alone on the third floor of her building and has to walk up the stairs.  She endorses intermittent rectal bleeding.  Last screening colonoscopy was in 2021 and showed internal hemorrhoids.  She denies nausea or vomiting and oral intake has been about the same.  She denies abdominal pain or diarrhea or dysuria.  Denies fever or chills, cough or chest pain. ED course and data review:Vitals unremarkable.  Labs notable for hemoglobin of 9.5, down from baseline of 11.5-12 and creatinine of 2.46 up from baseline around 1.86.  With bicarb of 18 and normal anion gap.  Urinalysis unremarkable.  Pelvic x-ray showing chronic degenerative changes of left inferior pubic ramus.  MRI lumbar spine without contrast ordered and read is pending. Patient treated with  oxycodone. Hospitalist consulted for admission for symptomatic anemia, hematochezia and inability to care for self due to ambulatory dysfunction.   Review of Systems: As mentioned in the history of present illness. All other systems reviewed and are negative.  Past Medical History:  Diagnosis Date   Allergy    Anxiety    Depression    Hyperlipidemia    Hypertension    Past Surgical History:  Procedure Laterality Date   ABDOMINAL HYSTERECTOMY     still has ovaries   COLONOSCOPY WITH PROPOFOL N/A 12/23/2019   Procedure: COLONOSCOPY WITH PROPOFOL;  Surgeon: Jonathon Bellows, MD;  Location: [facility] ENDOSCOPY;  Service: Gastroenterology;  Laterality: N/A;   Social History:  reports that she has never smoked. She has never used smokeless tobacco. She reports that she does not currently use alcohol. She reports that she does not use drugs.  Allergies  Allergen Reactions   Cashew Nut Oil Hives, Itching and Swelling   Latex Itching   Norvasc [Amlodipine] Other (See Comments)    Dizziness, flushed   Simvastatin     Other reaction(s): Unknown Memory changes Memory changes   Sulfa Antibiotics     Other reaction(s): Unknown   Levofloxacin Rash   Prednisone Rash    Family History  Problem Relation Age of Onset   Cancer Mother        breast   Breast cancer Mother    Depression Father    Heart disease Father    Alcohol abuse Father  Hypertension Sister    Diabetes Brother    Diabetes Brother    Breast cancer Maternal Aunt    Breast cancer Cousin     Prior to Admission medications   Medication Sig Start Date End Date Taking? Authorizing Provider  acetaminophen (TYLENOL) 650 MG CR tablet Take 650-1,300 mg by mouth at bedtime. As needed for arthritis.    [provider]  benzonatate (TESSALON) 100 MG capsule Take 1 capsule (100 mg total) by mouth 3 (three) times daily as needed. 09/19/22   Mar Daring, PA-C  buPROPion (WELLBUTRIN XL) 150 MG 24 hr tablet Take 2 tablets  (300 mg total) by mouth daily. 06/02/22   Gwyneth Sprout, FNP  calcitRIOL (ROCALTROL) 0.25 MCG capsule TAKE 1 CAPSULE BY MOUTH 1 TIME EACH DAY 10/06/22   Tally Joe T, FNP  Cetirizine HCl 10 MG CAPS Take by mouth. 07/30/16   [provider]  Cholecalciferol (VITAMIN D3) 5000 units TABS Take 5,000 tablets by mouth daily.    [provider]  COLLAGEN PO Take by mouth.    [provider]  FLUoxetine (PROZAC) 20 MG capsule Take 1 capsule (20 mg total) by mouth daily. Take once daily with 40 mg dose for total of 60 mg. 06/02/22   Gwyneth Sprout, FNP  FLUoxetine (PROZAC) 40 MG capsule Take 1 capsule (40 mg total) by mouth daily. Take once daily with 20 mg dose for total of 60 mg. 06/02/22   Tally Joe T, FNP  gabapentin (NEURONTIN) 300 MG capsule TAKE TAKE 2 CAPSULES BY MOUTH 3 TIMES A DAY 08/18/22   Gwyneth Sprout, FNP  gabapentin (NEURONTIN) 300 MG capsule Take 2 capsules (600 mg total) by mouth 3 (three) times daily. 09/30/22   Gwyneth Sprout, FNP  hydrALAZINE (APRESOLINE) 10 MG tablet Take 1 tablet (10 mg total) by mouth 3 (three) times daily. 06/02/22   Gwyneth Sprout, FNP  hydrOXYzine (VISTARIL) 25 MG capsule TAKE 1 CAPSULE (25 MG TOTAL) BY MOUTH EVERY 8 (EIGHT) HOURS AS NEEDED. 09/09/22   Gwyneth Sprout, FNP  losartan-hydrochlorothiazide (HYZAAR) 100-25 MG tablet Take 1 tablet by mouth daily. 06/02/22   Gwyneth Sprout, FNP  metoprolol succinate (TOPROL-XL) 25 MG 24 hr tablet TAKE 1 TABLET BY MOUTH DAILY 06/02/22   Gwyneth Sprout, FNP  montelukast (SINGULAIR) 10 MG tablet Take 1 tablet (10 mg total) by mouth daily. 06/02/22   Gwyneth Sprout, FNP    Physical Exam: Vitals:   10/20/22 1641 10/20/22 1644 10/20/22 2041 10/20/22 2301  BP:  (!) 157/92 (!) 186/77 138/70  Pulse:  83 74 76  Resp:  19 20 16   Temp:  (!) 97.4 F (36.3 C) (!) 97.5 F (36.4 C)   TempSrc:  Oral Oral   SpO2:  94% 97% 99%  Weight: (!) 158.8 kg     Height: 5\' 3"  (1.6 m)      Physical Exam  Labs on  Admission: I have personally reviewed following labs and imaging studies  CBC: Recent Labs  Lab 10/20/22 2001  WBC 8.4  NEUTROABS 6.4  HGB 9.5*  HCT 30.7*  MCV 93.6  PLT 097   Basic Metabolic Panel: Recent Labs  Lab 10/20/22 2044  NA 135  K 4.8  CL 109  CO2 18*  GLUCOSE 79  BUN 31*  CREATININE 2.46*  CALCIUM 8.5*   GFR: Estimated Creatinine Clearance: 36 mL/min (A) (by C-G formula based on SCr of 2.46 mg/dL (H)). Liver Function Tests:  Recent Labs  Lab 10/20/22 2044  AST 20  ALT 16  ALKPHOS 43  BILITOT 0.9  PROT 6.5  ALBUMIN 3.1*   No results for input(s): "LIPASE", "AMYLASE" in the last 168 hours. No results for input(s): "AMMONIA" in the last 168 hours. Coagulation Profile: No results for input(s): "INR", "PROTIME" in the last 168 hours. Cardiac Enzymes: No results for input(s): "CKTOTAL", "CKMB", "CKMBINDEX", "TROPONINI" in the last 168 hours. BNP (last 3 results) No results for input(s): "PROBNP" in the last 8760 hours. HbA1C: No results for input(s): "HGBA1C" in the last 72 hours. CBG: No results for input(s): "GLUCAP" in the last 168 hours. Lipid Profile: No results for input(s): "CHOL", "HDL", "LDLCALC", "TRIG", "CHOLHDL", "LDLDIRECT" in the last 72 hours. Thyroid Function Tests: No results for input(s): "TSH", "T4TOTAL", "FREET4", "T3FREE", "THYROIDAB" in the last 72 hours. Anemia Panel: No results for input(s): "VITAMINB12", "FOLATE", "FERRITIN", "TIBC", "IRON", "RETICCTPCT" in the last 72 hours. Urine analysis:    Component Value Date/Time   COLORURINE YELLOW (A) 10/20/2022 2001   APPEARANCEUR CLEAR (A) 10/20/2022 2001   LABSPEC 1.014 10/20/2022 2001   PHURINE 5.0 10/20/2022 2001   GLUCOSEU NEGATIVE 10/20/2022 2001   HGBUR NEGATIVE 10/20/2022 2001   [place] NEGATIVE 10/20/2022 2001   [place] NEGATIVE 10/20/2022 2001   PROTEINUR 100 (A) 10/20/2022 2001   NITRITE NEGATIVE 10/20/2022 2001   LEUKOCYTESUR NEGATIVE 10/20/2022 2001     Radiological Exams on Admission: DG Pelvis 1-2 Views  Result Date: 10/20/2022 CLINICAL DATA:  Status post fall 1 month ago. EXAM: PELVIS - 1-2 VIEW COMPARISON:  None Available. FINDINGS: There is no evidence of an acute pelvic fracture or diastasis. Chronic versus degenerative changes are seen involving the left inferior pubic ramus. IMPRESSION: Chronic versus degenerative changes involving the left inferior pubic ramus. Electronically Signed   By: Virgina Norfolk M.D.   On: 10/20/2022 22:47     Data Reviewed: Relevant notes from primary care and specialist visits, past discharge summaries as available in EHR, including Care Everywhere. Prior diagnostic testing as pertinent to current admission diagnoses Updated medications and problem lists for reconciliation ED course, including vitals, labs, imaging, treatment and response to treatment Triage notes, nursing and pharmacy notes and ED provider's notes Notable results as noted in HPI   Assessment and Plan: * Hematochezia Symptomatic anemia Chronic anemia CKD 4 Patient has intermittent bright red blood per rectum, increasing fatigue and hemoglobin 9.5 down from baseline of 11.6 in October 2023 Last colonoscopy was 2021 done as routine screening with finding of nonbleeding internal hemorrhoids Suspect hemorrhoidal bleeding Serial H&H and transfuse if needed GI consult  Acute renal failure superimposed on stage 4 chronic kidney disease ([place]) Metabolic acidosis Creatinine 2.46 up from baseline of 1.86 a few months prior with bicarb of 18 Possibly related to decreased intake/dehydration versus progression of kidney disease Patient follows with nephrology IV hydration and monitor, avoiding nephrotoxins  Chronic back pain Spinal stenosis with neurogenic claudication Ambulatory dysfunction Continue gabapentin Pain control PT and TOC consult  Class 3 severe obesity with body mass index (BMI) of 60.0 to 69.9 in adult  [facility]) Complicating factor to overall prognosis and care  Depression Continue fluoxetine, hydroxyzine and bupropion  OSA (obstructive sleep apnea) CPAP if desired    DVT prophylaxis: Lovenox  Consults: GI, Dr. Vicente Males  Advance Care Planning:   Code Status: Prior   Family Communication: none  Disposition Plan: Back to previous home environment  Severity of Illness: The appropriate patient status for this patient  is INPATIENT. Inpatient status is judged to be reasonable and necessary in order to provide the required intensity of service to ensure the patient's safety. The patient's presenting symptoms, physical exam findings, and initial radiographic and laboratory data in the context of their chronic comorbidities is felt to place them at high risk for further clinical deterioration. Furthermore, it is not anticipated that the patient will be medically stable for discharge from the hospital within 2 midnights of admission.   * I certify that at the point of admission it is my clinical judgment that the patient will require inpatient hospital care spanning beyond 2 midnights from the point of admission due to high intensity of service, high risk for further deterioration and high frequency of surveillance required.*  Author: Athena Masse, MD 10/20/2022 11:34 PM  For on call review [email or web address].

## 2022-10-20 NOTE — Assessment & Plan Note (Signed)
Reports 3 falls since 08/2022; will lay on floor "for hours" Lives alone

## 2022-10-20 NOTE — ED Triage Notes (Signed)
Arrives from home via [facility].  While on a teledoc visit with PCP r/t fall that occurred one month ago.  Initially patient states she had rectal bleeding after fall, but none currently.  VS wnl.  C/O left sided sciatic pain.  Vs wnl

## 2022-10-20 NOTE — Assessment & Plan Note (Addendum)
BMI 65.08

## 2022-10-20 NOTE — ED Provider Notes (Signed)
Consulted with Dr. Damita Dunnings our hospitalist.  Will plan for admission for further care and treatment regarding the patient's acute on chronic kidney injury, snus slightly worsening anemia, back pain, and hospitalist aware of pending MRI study   Delman Kitten, MD 10/20/22 2326

## 2022-10-20 NOTE — Assessment & Plan Note (Addendum)
Spinal stenosis with neurogenic claudication Ambulatory dysfunction.  Bilateral hip pain Continue gabapentin MRI lumbar spine---> no acute abnormality Pain control PT recommending rehab. Add diclofenac gel to hips. I will try a trial of steroid to see if that helps out with her pains.

## 2022-10-20 NOTE — ED Notes (Signed)
Pt returned from MRI at this time

## 2022-10-20 NOTE — Assessment & Plan Note (Signed)
Request for home health assistance; will need likely SNF vs ALF following inpatient evaluation Would benefit from apartment relocation to ground floor

## 2022-10-20 NOTE — Assessment & Plan Note (Addendum)
Chronic, unknown- previously uncontrolled Goal <130/<80

## 2022-10-20 NOTE — Patient Instructions (Signed)
Recommend ER given concerns for weakness, and bleeding in both urine and stool following fall. Has not sought care or left her apartment since 09/08/22.

## 2022-10-20 NOTE — Assessment & Plan Note (Signed)
Chronic, unknown Goal LDL <70

## 2022-10-20 NOTE — Assessment & Plan Note (Signed)
Endorses blood in urine Recommend emergent evaluation Hx of CKD last assessed >3 months ago

## 2022-10-20 NOTE — Assessment & Plan Note (Signed)
Last known weight ~350#

## 2022-10-21 ENCOUNTER — Inpatient Hospital Stay: Payer: HMO

## 2022-10-21 DIAGNOSIS — Z6841 Body Mass Index (BMI) 40.0 and over, adult: Secondary | ICD-10-CM

## 2022-10-21 DIAGNOSIS — M545 Low back pain, unspecified: Secondary | ICD-10-CM | POA: Diagnosis not present

## 2022-10-21 DIAGNOSIS — R262 Difficulty in walking, not elsewhere classified: Secondary | ICD-10-CM

## 2022-10-21 DIAGNOSIS — G8929 Other chronic pain: Secondary | ICD-10-CM

## 2022-10-21 DIAGNOSIS — N184 Chronic kidney disease, stage 4 (severe): Secondary | ICD-10-CM | POA: Diagnosis not present

## 2022-10-21 DIAGNOSIS — D631 Anemia in chronic kidney disease: Secondary | ICD-10-CM

## 2022-10-21 DIAGNOSIS — K921 Melena: Secondary | ICD-10-CM | POA: Diagnosis not present

## 2022-10-21 DIAGNOSIS — D649 Anemia, unspecified: Secondary | ICD-10-CM

## 2022-10-21 LAB — HEMOGLOBIN
Hemoglobin: 8.4 g/dL — ABNORMAL LOW (ref 12.0–15.0)
Hemoglobin: 8.1 g/dL — ABNORMAL LOW (ref 12.0–15.0)

## 2022-10-21 LAB — HIV ANTIBODY (ROUTINE TESTING W REFLEX): HIV Screen 4th Generation wRfx: NONREACTIVE

## 2022-10-21 MED ORDER — SODIUM CHLORIDE 0.9 % IV SOLN
INTRAVENOUS | Status: DC
  Administered 2022-10-22: 1000 mL via INTRAVENOUS

## 2022-10-21 MED ORDER — LOSARTAN POTASSIUM 50 MG PO TABS: 100.0000 mg | ORAL_TABLET | Freq: Every day | ORAL | Status: DC

## 2022-10-21 MED ORDER — OXYCODONE-ACETAMINOPHEN 5-325 MG PO TABS
1.0000 | ORAL_TABLET | Freq: Four times a day (QID) | ORAL | Status: DC | PRN
  Administered 2022-10-21 – 2022-10-23 (×6): 1 via ORAL
  Filled 2022-10-21 (×6): qty 1

## 2022-10-21 MED ORDER — HYDROCHLOROTHIAZIDE 25 MG PO TABS: 25.0000 mg | ORAL_TABLET | Freq: Every day | ORAL | Status: DC

## 2022-10-21 MED ORDER — POLYETHYLENE GLYCOL 3350 17 GM/SCOOP PO POWD
2.0000 | Freq: Once | ORAL | Status: AC
  Administered 2022-10-21: 510 g via ORAL
  Filled 2022-10-21: qty 510

## 2022-10-21 MED ORDER — PEG 3350-KCL-NA BICARB-NACL 420 G PO SOLR
4000.0000 mL | Freq: Once | ORAL | Status: DC
  Filled 2022-10-21: qty 4000

## 2022-10-21 NOTE — Evaluation (Signed)
Physical Therapy Evaluation Patient Details Name: Ashley Mccullough MRN: [id number] DOB: Jan 16, 1961 Today's Date: 10/21/2022  History of Present Illness  62 y.o. female with medical history significant for CKD 4 with anemia of CKD, obesity with BMI over 60, OSA, depression, chronic back pain due to spinal stenosis with neurogenic claudication presented with worsening hip pain and rectal bleeding.  On presentation, hemoglobin was 9.5  Clinical Impression  Pt pleasant and motivated but functionally quite limited.  She was able to do a brief bout of ambulation in the room (<20 ft) but was heavily forward leaning on the walker, needed a few standing rest breaks and c/o increasing b/l LE radiating pain that has been a progressively worsening issue for her.  She showed good effort and did get to EOB w/o assist but needed help to rise to standing and to get LEs back up into bed after the modest standing effort.  Pt will need continued PT to address functional limitations, not safe to return home at this time - recommending STR.      Recommendations for follow up therapy are one component of a multi-disciplinary discharge planning process, led by the attending physician.  Recommendations may be updated based on patient status, additional functional criteria and insurance authorization.  Follow Up Recommendations Skilled nursing-short term rehab (<3 hours/day) Can patient physically be transported by private vehicle: No    Assistance Recommended at Discharge Frequent or constant Supervision/Assistance  Patient can return home with the following  A lot of help with walking and/or transfers;A little help with bathing/dressing/bathroom;Assistance with cooking/housework;Assist for transportation;Help with stairs or ramp for entrance    Equipment Recommendations None recommended by PT  Recommendations for Other Services       Functional Status Assessment Patient has had a recent decline in their  functional status and demonstrates the ability to make significant improvements in function in a reasonable and predictable amount of time.     Precautions / Restrictions Precautions Precautions: Fall Restrictions Weight Bearing Restrictions: No      Mobility  Bed Mobility Overal bed mobility: Needs Assistance Bed Mobility: Supine to Sit, Sit to Supine     Supine to sit: Min guard Sit to supine: Mod assist   General bed mobility comments: heavy use of UEs/rails to rise, despite much effort unable to get LEs up into bed needing mod assist to get back to supine    Transfers Overall transfer level: Needs assistance Equipment used: Rolling walker (2 wheels) Transfers: Sit to/from Stand Sit to Stand: Min assist, From elevated surface           General transfer comment: unable to rise from standard height bed, raised 2-3" and gave light assist to insure forward momentum.  Pt remains forward flexed and leaning heavily on walker once "upright"    Ambulation/Gait Ambulation/Gait assistance: Min assist Gait Distance (Feet): 18 Feet Assistive device: Rolling walker (2 wheels)         General Gait Details: Pt with very labored, forward flexed poor ambulation.  during brief effort 2 standing (forearms leaning on walker) rest breaks.  Pt fatigued quickly but was even more limited by increasing pain in b/l LEs  Stairs            Wheelchair Mobility    Modified Rankin (Stroke Patients Only)       Balance Overall balance assessment: Needs assistance Sitting-balance support: No upper extremity supported Sitting balance-Leahy Scale: Good     Standing balance support: Bilateral upper extremity  supported Standing balance-Leahy Scale: Poor Standing balance comment: highly reliant on walker, poor standing tolerance                             Pertinent Vitals/Pain Pain Assessment Pain Assessment: 0-10 Pain Score: 9  Pain Location: b/l buttocks and down  posterior LEs    Home Living Family/patient expects to be discharged to:: Skilled nursing facility Living Arrangements: Alone                 Additional Comments: pt lives in 3rd story apartment (w/o elevator), has walker and cane.  aunt and sister intermittently available    Prior Function Prior Level of Function : Needs assist             Mobility Comments: has been reliant on walker/cane for years, almost never out of the home (groceries, etc delivered) and ADLs Comments: she reports she has had more and more difficulty with cooking and mostly orders in, similarly having more and more difficulty with home upkeep, dressing, etc     Hand Dominance        Extremity/Trunk Assessment   Upper Extremity Assessment Upper Extremity Assessment: Overall WFL for tasks assessed;Generalized weakness    Lower Extremity Assessment Lower Extremity Assessment: Overall WFL for tasks assessed;Generalized weakness       Communication   Communication: No difficulties  Cognition Arousal/Alertness: Awake/alert Behavior During Therapy: WFL for tasks assessed/performed Overall Cognitive Status: Within Functional Limits for tasks assessed                                          General Comments General comments (skin integrity, edema, etc.): Pt was very limited with activity tolerance, LE pain    Exercises     Assessment/Plan    PT Assessment Patient needs continued PT services  PT Problem List Decreased strength;Decreased range of motion;Decreased activity tolerance;Decreased balance;Decreased mobility;Decreased knowledge of use of DME;Decreased safety awareness;Pain       PT Treatment Interventions DME instruction;Gait training;Stair training;Functional mobility training;Therapeutic activities;Therapeutic exercise;Balance training;Neuromuscular re-education;Patient/family education    PT Goals (Current goals can be found in the Care Plan section)  Acute  Rehab PT Goals Patient Stated Goal: lose weight, get stronger PT Goal Formulation: With patient Time For Goal Achievement: 11/03/22 Potential to Achieve Goals: Fair    Frequency Min 2X/week     Co-evaluation               AM-PAC PT "6 Clicks" Mobility  Outcome Measure Help needed turning from your back to your side while in a flat bed without using bedrails?: A Little Help needed moving from lying on your back to sitting on the side of a flat bed without using bedrails?: A Little Help needed moving to and from a bed to a chair (including a wheelchair)?: A Little Help needed standing up from a chair using your arms (e.g., wheelchair or bedside chair)?: A Little Help needed to walk in hospital room?: A Lot Help needed climbing 3-5 steps with a railing? : Total 6 Click Score: 15    End of Session Equipment Utilized During Treatment: Gait belt Activity Tolerance: Patient limited by pain;Patient limited by fatigue Patient left: in bed;with call bell/phone within reach Nurse Communication: Mobility status PT Visit Diagnosis: Muscle weakness (generalized) (M62.81);Pain;Difficulty in walking, not elsewhere classified (R26.2) Pain -  Right/Left: Left Pain - part of body: Leg    Time: 6294-7654 PT Time Calculation (min) (ACUTE ONLY): 39 min   Charges:   PT Evaluation $PT Eval Moderate Complexity: 1 Mod PT Treatments $Gait Training: 8-22 mins $Therapeutic Activity: 8-22 mins        Kreg Shropshire, DPT 10/21/2022, 6:28 PM

## 2022-10-21 NOTE — ED Notes (Signed)
Pt able to stand without assistance to use bedside commode. X1 unmeasured urine occurrence and x1 unmeasured stool occurrence. Pt assisted with wiping. Pt back into bed and positioned self for comfort. Pt denies further needs at this time, call light in reach.

## 2022-10-21 NOTE — Consult Note (Signed)
Ashley Lame, MD [facility]  [address], [place], [place] [address] Phone: [phone] Fax : [phone]  Consultation  Referring Provider:     Dr. Damita Dunnings Primary Care Physician:  Gwyneth Sprout, FNP Primary Gastroenterologist:  Dr. Dr. Vicente Males         Reason for Consultation:     GI bleed  Date of Admission:  10/20/2022 Date of Consultation:  10/21/2022          HPI:   Ashley Mccullough is a 62 y.o. female who has a history of hypertension diabetes hyperlipidemia with chronic back pain.  The patient has had 2 falls since Thanksgiving and had a video visit with her primary care provider yesterday.  The patient had reported rectal bleeding since Thanksgiving.  The patient had a colonoscopy back in 2021 by Dr. Vicente Males that showed only diverticulosis.  Patient has chronic incontinence of urine and stool.  She endorses influenza infection a few weeks ago and reported that she was unable to get to the toilet on time.  He had told the ED physician that she has bright red blood that fills the toilet not mixed with the stools and is pretty much with every bowel movement.  Patient had iron studies yesterday that showed a normal iron with a normal saturation and TIBC.  Ferritin was also normal.  Patient's hemoglobin trends have shown:  Component     Latest Ref Rng 06/02/2022 10/20/2022 10/21/2022  Hemoglobin     12.0 - 15.0 g/dL 11.6  9.5 (L)  8.1 (L)   Hemoglobin        8.4 (L)    The patient's creatinine has been elevated and a CT angiography was not done to look for source of bleeding. The patient denied any abdominal pain.  She also denies any black stools or bloody stools.  Past Medical History:  Diagnosis Date   Allergy    Anxiety    Depression    Hyperlipidemia    Hypertension     Past Surgical History:  Procedure Laterality Date   ABDOMINAL HYSTERECTOMY     still has ovaries   COLONOSCOPY WITH PROPOFOL N/A 12/23/2019   Procedure: COLONOSCOPY WITH PROPOFOL;  Surgeon: Jonathon Bellows, MD;  Location: [facility] ENDOSCOPY;  Service: Gastroenterology;  Laterality: N/A;    Prior to Admission medications   Medication Sig Start Date End Date Taking? Authorizing Provider  acetaminophen (TYLENOL) 650 MG CR tablet Take 650-1,300 mg by mouth at bedtime. As needed for arthritis.    [provider]  benzonatate (TESSALON) 100 MG capsule Take 1 capsule (100 mg total) by mouth 3 (three) times daily as needed. 09/19/22   Mar Daring, PA-C  buPROPion (WELLBUTRIN XL) 150 MG 24 hr tablet Take 2 tablets (300 mg total) by mouth daily. 06/02/22   Gwyneth Sprout, FNP  calcitRIOL (ROCALTROL) 0.25 MCG capsule TAKE 1 CAPSULE BY MOUTH 1 TIME EACH DAY 10/06/22   Tally Joe T, FNP  Cetirizine HCl 10 MG CAPS Take by mouth. 07/30/16   [provider]  Cholecalciferol (VITAMIN D3) 5000 units TABS Take 5,000 tablets by mouth daily.    [provider]  COLLAGEN PO Take by mouth.    [provider]  FLUoxetine (PROZAC) 20 MG capsule Take 1 capsule (20 mg total) by mouth daily. Take once daily with 40 mg dose for total of 60 mg. 06/02/22   Gwyneth Sprout, FNP  FLUoxetine (PROZAC) 40 MG capsule  Take 1 capsule (40 mg total) by mouth daily. Take once daily with 20 mg dose for total of 60 mg. 06/02/22   Tally Joe T, FNP  gabapentin (NEURONTIN) 300 MG capsule TAKE TAKE 2 CAPSULES BY MOUTH 3 TIMES A DAY 08/18/22   Gwyneth Sprout, FNP  gabapentin (NEURONTIN) 300 MG capsule Take 2 capsules (600 mg total) by mouth 3 (three) times daily. 09/30/22   Gwyneth Sprout, FNP  hydrALAZINE (APRESOLINE) 10 MG tablet Take 1 tablet (10 mg total) by mouth 3 (three) times daily. 06/02/22   Gwyneth Sprout, FNP  hydrOXYzine (VISTARIL) 25 MG capsule TAKE 1 CAPSULE (25 MG TOTAL) BY MOUTH EVERY 8 (EIGHT) HOURS AS NEEDED. 09/09/22   Gwyneth Sprout, FNP  losartan-hydrochlorothiazide (HYZAAR) 100-25 MG tablet Take 1 tablet by mouth daily. 06/02/22   Gwyneth Sprout, FNP  metoprolol succinate (TOPROL-XL)  25 MG 24 hr tablet TAKE 1 TABLET BY MOUTH DAILY 06/02/22   Gwyneth Sprout, FNP  montelukast (SINGULAIR) 10 MG tablet Take 1 tablet (10 mg total) by mouth daily. 06/02/22   Gwyneth Sprout, FNP    Family History  Problem Relation Age of Onset   Cancer Mother        breast   Breast cancer Mother    Depression Father    Heart disease Father    Alcohol abuse Father    Hypertension Sister    Diabetes Brother    Diabetes Brother    Breast cancer Maternal Aunt    Breast cancer Cousin      Social History   Tobacco Use   Smoking status: Never   Smokeless tobacco: Never  Vaping Use   Vaping Use: Never used  Substance Use Topics   Alcohol use: Not Currently   Drug use: No    Allergies as of 10/20/2022 - Review Complete 10/20/2022  Allergen Reaction Noted   Cashew nut oil Hives, Itching, and Swelling 01/12/2020   Latex Itching 07/30/2016   Norvasc [amlodipine] Other (See Comments) 08/05/2021   Simvastatin  05/29/2015   Sulfa antibiotics  05/29/2015   Levofloxacin Rash 08/01/2016   Prednisone Rash 08/01/2016    Review of Systems:    All systems reviewed and negative except where noted in HPI.   Physical Exam:  Vital signs in last 24 hours: Temp:  [97.4 F (36.3 C)-98 F (36.7 C)] 98 F (36.7 C) (02/06 0456) Pulse Rate:  [65-83] 65 (02/06 0700) Resp:  [16-20] 18 (02/06 0700) BP: (116-186)/(66-92) 116/70 (02/06 0700) SpO2:  [94 %-99 %] 99 % (02/06 0700) Weight:  [158.8 kg] 158.8 kg (02/05 1641)   General:   Pleasant, cooperative in NAD Head:  Normocephalic and atraumatic. Eyes:   No icterus.   Conjunctiva pink. PERRLA. Ears:  Normal auditory acuity. Neck:  Supple; no masses or thyroidomegaly Lungs: Respirations even and unlabored. Lungs clear to auscultation bilaterally.   No wheezes, crackles, or rhonchi.  Heart:  Regular rate and rhythm;  Without murmur, clicks, rubs or gallops Abdomen:  Soft, nondistended, nontender. Normal bowel sounds. No appreciable masses or  hepatomegaly.  No rebound or guarding.  Rectal:  Not performed. Msk:  Symmetrical without gross deformities.   Extremities:  Without edema, cyanosis or clubbing. Neurologic:  Alert and oriented x3;  grossly normal neurologically. Skin:  Intact without significant lesions or rashes. Cervical Nodes:  No significant cervical adenopathy. Psych:  Alert and cooperative. Normal affect.  LAB RESULTS: Recent Labs    10/20/22 2001 10/21/22 0159 10/21/22  0549  WBC 8.4  --   --   HGB 9.5* 8.4* 8.1*  HCT 30.7*  --   --   PLT 199  --   --    BMET Recent Labs    10/20/22 2044  NA 135  K 4.8  CL 109  CO2 18*  GLUCOSE 79  BUN 31*  CREATININE 2.46*  CALCIUM 8.5*   LFT Recent Labs    10/20/22 2044  PROT 6.5  ALBUMIN 3.1*  AST 20  ALT 16  ALKPHOS 43  BILITOT 0.9   PT/INR No results for input(s): "LABPROT", "INR" in the last 72 hours.  STUDIES: MR LUMBAR SPINE WO CONTRAST  Result Date: 10/21/2022 CLINICAL DATA:  Initial evaluation for low back pain. EXAM: MRI LUMBAR SPINE WITHOUT CONTRAST TECHNIQUE: Multiplanar, multisequence MR imaging of the lumbar spine was performed. No intravenous contrast was administered. COMPARISON:  Prior MRI from 04/25/2019. FINDINGS: Segmentation: Standard. Lowest well-formed disc space labeled the L5-S1 level. Alignment: 4 mm facet mediated anterolisthesis of L4 on L5, stable. Alignment otherwise normal preservation of the normal lumbar lordosis. Vertebrae: Vertebral body height maintained without acute or chronic fracture. Bone marrow signal intensity within normal limits. No worrisome osseous lesions. Discogenic reactive endplate change with mild marrow edema present about the T10-11, L4-5, and L5-S1 interspaces. No other abnormal marrow edema. Conus medullaris and cauda equina: Conus extends to the T12 level. Conus and cauda equina appear normal. Paraspinal and other soft tissues: Unremarkable. Disc levels: T10-11: Seen only on sagittal projection. Disc  desiccation with diffuse disc bulge. Reactive endplate spurring. No significant spinal stenosis. Mild to moderate bilateral foraminal narrowing. T11-12: Unremarkable. T12-L1: Unremarkable. L1-2:  Unremarkable. L2-3: Minimal left eccentric disc bulge. No spinal stenosis. Foramina remain patent. L3-4: Mild annular disc bulging. Mild to moderate facet hypertrophy. Associated prominent joint effusions. Borderline mild narrowing of the lateral recesses bilaterally. Central canal remains patent. Mild bilateral L3 foraminal narrowing. L4-5: 4 mm anterolisthesis. Disc desiccation with broad posterior pseudo disc bulge/uncovering, asymmetric to the right. Associated slight superior migration. Severe bilateral facet arthrosis. Prominent joint effusion on the right. Resultant mild bilateral subarticular stenosis. Central canal remains patent. Moderate right worse than left L4 foraminal stenosis. L5-S1: Degenerative intervertebral disc space narrowing with diffuse disc bulge and disc desiccation. Reactive endplate change with marginal endplate osteophytic spurring. Superimposed shallow right subarticular disc protrusion with annular fissure mildly indents the right ventral thecal sac (series 12, image 32). Mild facet hypertrophy. No significant spinal stenosis. Moderate bilateral L5 foraminal narrowing. IMPRESSION: 1. No acute abnormality within the lumbar spine. 2. 4 mm facet mediated anterolisthesis of L4 on L5 with resultant mild bilateral subarticular stenosis, with moderate right worse than left L4 foraminal narrowing. 3. Moderate bilateral L5 foraminal stenosis related to disc bulge, endplate change, and facet hypertrophy. 4. Mild to moderate bilateral L3 foraminal stenosis related to disc bulge and facet hypertrophy. Electronically Signed   By: Jeannine Boga M.D.   On: 10/21/2022 00:37   DG Pelvis 1-2 Views  Result Date: 10/20/2022 CLINICAL DATA:  Status post fall 1 month ago. EXAM: PELVIS - 1-2 VIEW  COMPARISON:  None Available. FINDINGS: There is no evidence of an acute pelvic fracture or diastasis. Chronic versus degenerative changes are seen involving the left inferior pubic ramus. IMPRESSION: Chronic versus degenerative changes involving the left inferior pubic ramus. Electronically Signed   By: Virgina Norfolk M.D.   On: 10/20/2022 22:47      Impression / Plan:   Assessment: Principal Problem:  Hematochezia Active Problems:   Depression   Class 3 severe obesity with body mass index (BMI) of 60.0 to 69.9 in adult (HCC)   Anemia of chronic kidney failure, stage 4 (severe) (HCC)   Acute renal failure superimposed on stage 4 chronic kidney disease (HCC)   Chronic back pain   Ambulatory dysfunction   OSA (obstructive sleep apnea)   Symptomatic anemia   Ashley Mccullough is a 62 y.o. y/o female with Rectal bleeding.  The patient has had previous falls and a drop in hemoglobin from 11.6 back in September of last year to 8.1 on 26 with 9.5 on admission. The patient reports that she has bright red blood per rectum.  Plan:  The patient will be set up for colonoscopy by Dr. Vicente Males.  The patient will be given a prep.  The patient has been told that because the blood is bright red and her blood pressure and hemoglobin is not low enough to suggest a Massive upper GI bleed, she should be set up for a colonoscopy.  The patient has been explained the plan and agrees with it.   Thank you for involving me in the care of this patient.      LOS: 1 day   Ashley Lame, MD, [facility] 10/21/2022, 9:54 AM,  Pager [phone] 7am-5pm  Check AMION for 5pm -7am coverage and on weekends   Note: This dictation was prepared with Dragon dictation along with smaller phrase technology. Any transcriptional errors that result from this process are unintentional.

## 2022-10-21 NOTE — Progress Notes (Signed)
PROGRESS NOTE    Ashley Mccullough  XQJ:[id number] DOB: 02-Apr-1961 DOA: 10/20/2022 PCP: Gwyneth Sprout, FNP   Brief Narrative:  62 y.o. female with medical history significant for CKD 4 with anemia of CKD, obesity with BMI over 60, OSA, depression, chronic back pain due to spinal stenosis with neurogenic claudication presented with worsening hip pain and rectal bleeding.  On presentation, hemoglobin was 9.5 down from baseline of 11.5-12 with creatinine of 2.46, up from baseline of around 1.86 and bicarb of 18. Pelvic x-ray showing chronic degenerative changes of left inferior pubic ramus.  GI was consulted.  Assessment & Plan:   Hematochezia/possible lower GI bleeding Symptomatic anemia Anemia of chronic disease from chronic kidney disease -Presented with intermittent rectal bleeding with hemoglobin of 9.5, down from baseline of 11.6 in October 2023 -Continue monitoring H&H.  Hemoglobin has dropped down to 8.1 this morning.  GI evaluation pending.  Transfuse if hemoglobin is less than 7 or there is active bleeding  Acute kidney injury on CKD stage IV Acute metabolic acidosis -Creatinine 2.46 up from baseline of 1.86 a few months prior with bicarb of 18 on presentation -Creatinine and bicarb pending this morning.  Continue IV fluids. -If creatinine continues to worsen, will need nephrology evaluation  Chronic back pain Spinal stenosis with neurogenic claudication Ambulatory dysfunction -MRI of lumbar spine showed no acute abnormality within the lumbar spine; showed chronic spinal stenosis.  PT eval.  Might need outpatient neurosurgery evaluation -Patient interested in SNF placement.  TOC consult -Continue gabapentin and current pain management  Class III severe obesity -Outpatient follow-up  Depression -Continue fluoxetine, hydroxyzine and bupropion  OSA -CPAP if desired   DVT prophylaxis: SCDs Code Status: Full Family Communication: Aunt at bedside Disposition  Plan: Status is: Inpatient Remains inpatient appropriate because: Of severity of illness    Consultants: GI  Procedures: None  Antimicrobials: None   Subjective: Patient seen and examined at bedside.  Denies any current chest pain, fever or vomiting.  Complains of bilateral lower extremity swelling and pain.  Feels very weak  Objective: Vitals:   10/21/22 0456 10/21/22 0600 10/21/22 0630 10/21/22 0700  BP: 131/66 132/67 126/69 116/70  Pulse: 72 66 67 65  Resp: 16 18 17 18   Temp: 98 F (36.7 C)     TempSrc: Oral     SpO2: 97% 99% 97% 99%  Weight:      Height:       No intake or output data in the 24 hours ending 10/21/22 0944 Filed Weights   10/20/22 1641  Weight: (!) 158.8 kg    Examination:  General exam: Appears calm and comfortable.  Currently on room air.  Looks chronically ill and deconditioned.   Respiratory system: Bilateral decreased breath sounds at bases with scattered crackles Cardiovascular system: S1 & S2 heard, Rate controlled Gastrointestinal system: Abdomen is morbidly obese, nondistended, soft and nontender. Normal bowel sounds heard. Extremities: No cyanosis, clubbing; bilateral lower extremity edema present  Central nervous system: Alert and oriented. No focal neurological deficits. Moving extremities Skin: No rashes, lesions or ulcers Psychiatry: Flat affect.  Not agitated.  Data Reviewed: I have personally reviewed following labs and imaging studies  CBC: Recent Labs  Lab 10/20/22 2001 10/21/22 0159 10/21/22 0549  WBC 8.4  --   --   NEUTROABS 6.4  --   --   HGB 9.5* 8.4* 8.1*  HCT 30.7*  --   --   MCV 93.6  --   --  PLT 199  --   --    Basic Metabolic Panel: Recent Labs  Lab 10/20/22 2044  NA 135  K 4.8  CL 109  CO2 18*  GLUCOSE 79  BUN 31*  CREATININE 2.46*  CALCIUM 8.5*   GFR: Estimated Creatinine Clearance: 36 mL/min (A) (by C-G formula based on SCr of 2.46 mg/dL (H)). Liver Function Tests: Recent Labs  Lab  10/20/22 2044  AST 20  ALT 16  ALKPHOS 43  BILITOT 0.9  PROT 6.5  ALBUMIN 3.1*   No results for input(s): "LIPASE", "AMYLASE" in the last 168 hours. No results for input(s): "AMMONIA" in the last 168 hours. Coagulation Profile: No results for input(s): "INR", "PROTIME" in the last 168 hours. Cardiac Enzymes: No results for input(s): "CKTOTAL", "CKMB", "CKMBINDEX", "TROPONINI" in the last 168 hours. BNP (last 3 results) No results for input(s): "PROBNP" in the last 8760 hours. HbA1C: No results for input(s): "HGBA1C" in the last 72 hours. CBG: No results for input(s): "GLUCAP" in the last 168 hours. Lipid Profile: No results for input(s): "CHOL", "HDL", "LDLCALC", "TRIG", "CHOLHDL", "LDLDIRECT" in the last 72 hours. Thyroid Function Tests: No results for input(s): "TSH", "T4TOTAL", "FREET4", "T3FREE", "THYROIDAB" in the last 72 hours. Anemia Panel: Recent Labs    10/20/22 2043  FERRITIN 80  TIBC 262  IRON 62   Sepsis Labs: No results for input(s): "PROCALCITON", "LATICACIDVEN" in the last 168 hours.  No results found for this or any previous visit (from the past 240 hour(s)).       Radiology Studies: MR LUMBAR SPINE WO CONTRAST  Result Date: 10/21/2022 CLINICAL DATA:  Initial evaluation for low back pain. EXAM: MRI LUMBAR SPINE WITHOUT CONTRAST TECHNIQUE: Multiplanar, multisequence MR imaging of the lumbar spine was performed. No intravenous contrast was administered. COMPARISON:  Prior MRI from 04/25/2019. FINDINGS: Segmentation: Standard. Lowest well-formed disc space labeled the L5-S1 level. Alignment: 4 mm facet mediated anterolisthesis of L4 on L5, stable. Alignment otherwise normal preservation of the normal lumbar lordosis. Vertebrae: Vertebral body height maintained without acute or chronic fracture. Bone marrow signal intensity within normal limits. No worrisome osseous lesions. Discogenic reactive endplate change with mild marrow edema present about the T10-11,  L4-5, and L5-S1 interspaces. No other abnormal marrow edema. Conus medullaris and cauda equina: Conus extends to the T12 level. Conus and cauda equina appear normal. Paraspinal and other soft tissues: Unremarkable. Disc levels: T10-11: Seen only on sagittal projection. Disc desiccation with diffuse disc bulge. Reactive endplate spurring. No significant spinal stenosis. Mild to moderate bilateral foraminal narrowing. T11-12: Unremarkable. T12-L1: Unremarkable. L1-2:  Unremarkable. L2-3: Minimal left eccentric disc bulge. No spinal stenosis. Foramina remain patent. L3-4: Mild annular disc bulging. Mild to moderate facet hypertrophy. Associated prominent joint effusions. Borderline mild narrowing of the lateral recesses bilaterally. Central canal remains patent. Mild bilateral L3 foraminal narrowing. L4-5: 4 mm anterolisthesis. Disc desiccation with broad posterior pseudo disc bulge/uncovering, asymmetric to the right. Associated slight superior migration. Severe bilateral facet arthrosis. Prominent joint effusion on the right. Resultant mild bilateral subarticular stenosis. Central canal remains patent. Moderate right worse than left L4 foraminal stenosis. L5-S1: Degenerative intervertebral disc space narrowing with diffuse disc bulge and disc desiccation. Reactive endplate change with marginal endplate osteophytic spurring. Superimposed shallow right subarticular disc protrusion with annular fissure mildly indents the right ventral thecal sac (series 12, image 32). Mild facet hypertrophy. No significant spinal stenosis. Moderate bilateral L5 foraminal narrowing. IMPRESSION: 1. No acute abnormality within the lumbar spine. 2. 4 mm facet  mediated anterolisthesis of L4 on L5 with resultant mild bilateral subarticular stenosis, with moderate right worse than left L4 foraminal narrowing. 3. Moderate bilateral L5 foraminal stenosis related to disc bulge, endplate change, and facet hypertrophy. 4. Mild to moderate bilateral  L3 foraminal stenosis related to disc bulge and facet hypertrophy. Electronically Signed   By: Jeannine Boga M.D.   On: 10/21/2022 00:37   DG Pelvis 1-2 Views  Result Date: 10/20/2022 CLINICAL DATA:  Status post fall 1 month ago. EXAM: PELVIS - 1-2 VIEW COMPARISON:  None Available. FINDINGS: There is no evidence of an acute pelvic fracture or diastasis. Chronic versus degenerative changes are seen involving the left inferior pubic ramus. IMPRESSION: Chronic versus degenerative changes involving the left inferior pubic ramus. Electronically Signed   By: Virgina Norfolk M.D.   On: 10/20/2022 22:47        Scheduled Meds:  buPROPion  300 mg Oral Daily   calcitRIOL  0.25 mcg Oral Daily   FLUoxetine  60 mg Oral Daily   gabapentin  600 mg Oral TID   hydrALAZINE  10 mg Oral TID   metoprolol succinate  25 mg Oral Daily   Continuous Infusions:  sodium chloride 100 mL/hr at 10/21/22 0157          Aline August, MD [facility] 10/21/2022, 9:44 AM

## 2022-10-22 ENCOUNTER — Encounter
Admission: EM | Disposition: A | Discharge status: Skilled Nursing Facility | Payer: Self-pay | Source: Home / Self Care | Attending: Internal Medicine

## 2022-10-22 ENCOUNTER — Inpatient Hospital Stay: Payer: HMO | Admitting: Anesthesiology

## 2022-10-22 ENCOUNTER — Encounter: Payer: Self-pay | Admitting: Internal Medicine

## 2022-10-22 DIAGNOSIS — N179 Acute kidney failure, unspecified: Secondary | ICD-10-CM

## 2022-10-22 DIAGNOSIS — K921 Melena: Secondary | ICD-10-CM | POA: Diagnosis not present

## 2022-10-22 HISTORY — PX: COLONOSCOPY WITH PROPOFOL: SHX5780

## 2022-10-22 LAB — CBC WITH DIFFERENTIAL/PLATELET
Abs Immature Granulocytes: 0.05 10*3/uL (ref 0.00–0.07)
Basophils Absolute: 0 10*3/uL (ref 0.0–0.1)
Basophils Relative: 0 %
Eosinophils Absolute: 0.4 10*3/uL (ref 0.0–0.5)
Eosinophils Relative: 5 %
HCT: 29.3 % — ABNORMAL LOW (ref 36.0–46.0)
Hemoglobin: 8.9 g/dL — ABNORMAL LOW (ref 12.0–15.0)
Immature Granulocytes: 1 %
Lymphocytes Relative: 15 %
Lymphs Abs: 1.2 10*3/uL (ref 0.7–4.0)
MCH: 28.3 pg (ref 26.0–34.0)
MCHC: 30.4 g/dL (ref 30.0–36.0)
MCV: 93.3 fL (ref 80.0–100.0)
Monocytes Absolute: 0.8 10*3/uL (ref 0.1–1.0)
Monocytes Relative: 10 %
Neutro Abs: 5.7 10*3/uL (ref 1.7–7.7)
Neutrophils Relative %: 69 %
Platelets: 248 10*3/uL (ref 150–400)
RBC: 3.14 MIL/uL — ABNORMAL LOW (ref 3.87–5.11)
RDW: 15.3 % (ref 11.5–15.5)
WBC: 8.2 10*3/uL (ref 4.0–10.5)
nRBC: 0 % (ref 0.0–0.2)

## 2022-10-22 LAB — GLUCOSE, CAPILLARY: Glucose-Capillary: 97 mg/dL (ref 70–99)

## 2022-10-22 LAB — BASIC METABOLIC PANEL
Anion gap: 4 — ABNORMAL LOW (ref 5–15)
BUN: 24 mg/dL — ABNORMAL HIGH (ref 8–23)
CO2: 23 mmol/L (ref 22–32)
Calcium: 8.7 mg/dL — ABNORMAL LOW (ref 8.9–10.3)
Chloride: 110 mmol/L (ref 98–111)
Creatinine, Ser: 2.21 mg/dL — ABNORMAL HIGH (ref 0.44–1.00)
GFR, Estimated: 25 mL/min — ABNORMAL LOW (ref >60)
Glucose, Bld: 105 mg/dL — ABNORMAL HIGH (ref 70–99)
Potassium: 4.1 mmol/L (ref 3.5–5.1)
Sodium: 137 mmol/L (ref 135–145)

## 2022-10-22 LAB — MAGNESIUM: Magnesium: 2 mg/dL (ref 1.7–2.4)

## 2022-10-22 SURGERY — COLONOSCOPY WITH PROPOFOL
Anesthesia: General

## 2022-10-22 MED ORDER — HYDROCORTISONE ACETATE 25 MG RE SUPP
25.0000 mg | Freq: Two times a day (BID) | RECTAL | Status: DC
  Administered 2022-10-22 – 2022-10-30 (×13): 25 mg via RECTAL
  Filled 2022-10-22 (×17): qty 1

## 2022-10-22 MED ORDER — PROPOFOL 500 MG/50ML IV EMUL
INTRAVENOUS | Status: DC | PRN
  Administered 2022-10-22: 125 ug/kg/min via INTRAVENOUS

## 2022-10-22 MED ORDER — PROPOFOL 10 MG/ML IV BOLUS
INTRAVENOUS | Status: DC | PRN
  Administered 2022-10-22: 90 mg via INTRAVENOUS

## 2022-10-22 MED ORDER — LIDOCAINE HCL (CARDIAC) PF 100 MG/5ML IV SOSY
PREFILLED_SYRINGE | INTRAVENOUS | Status: DC | PRN
  Administered 2022-10-22: 20 mg via INTRAVENOUS

## 2022-10-22 MED ORDER — GLYCOPYRROLATE 0.2 MG/ML IJ SOLN
INTRAMUSCULAR | Status: DC | PRN
  Administered 2022-10-22: .2 mg via INTRAVENOUS

## 2022-10-22 MED ORDER — GLYCOPYRROLATE 0.2 MG/ML IJ SOLN
INTRAMUSCULAR | Status: AC
  Filled 2022-10-22: qty 1

## 2022-10-22 NOTE — Progress Notes (Signed)
[place] [facility] [place] NAME: Ashley Mccullough    MR#:  [id number]  DATE OF BIRTH:  [birth year]  SUBJECTIVE:  patient had colonoscopy earlier. No more rectal bleed. Found to have large internal hemorrhoids. Patient recently had constipation after she had taken some pain meds for back pain. Normally she has regular bowel movement. Family at bedside. No abdominal pain. Tolerating PO diet   VITALS:  Blood pressure (!) 154/88, pulse 77, temperature (!) 97.4 F (36.3 C), temperature source Temporal, resp. rate 18, height 5\' 2"  (1.575 m), weight (!) 161.4 kg, SpO2 98 %.  PHYSICAL EXAMINATION:   GENERAL:  62 y.o.-year-old patient with no acute distress. Morbid obese LUNGS: Normal breath sounds bilaterally, no wheezing CARDIOVASCULAR: S1, S2 normal. No murmur   ABDOMEN: Soft, nontender, nondistended. Bowel sounds present.  EXTREMITIES: No  edema b/l.    NEUROLOGIC: nonfocal  patient is alert and awake SKIN: per RN  LABORATORY PANEL:  CBC Recent Labs  Lab 10/22/22 0159  WBC 8.2  HGB 8.9*  HCT 29.3*  PLT 248    Chemistries  Recent Labs  Lab 10/20/22 2044 10/22/22 0159  NA 135 137  K 4.8 4.1  CL 109 110  CO2 18* 23  GLUCOSE 79 105*  BUN 31* 24*  CREATININE 2.46* 2.21*  CALCIUM 8.5* 8.7*  MG  --  2.0  AST 20  --   ALT 16  --   ALKPHOS 43  --   BILITOT 0.9  --    Cardiac Enzymes No results for input(s): "TROPONINI" in the last 168 hours. RADIOLOGY:  US Venous Img Lower Bilateral (DVT)  Result Date: 10/21/2022 CLINICAL DATA:  Swelling and pain, recent fall EXAM: BILATERAL LOWER EXTREMITY VENOUS DOPPLER ULTRASOUND TECHNIQUE: Gray-scale sonography with graded compression, as well as color Doppler and duplex ultrasound were performed to evaluate the lower extremity deep venous systems from the level of the common femoral vein and including the common femoral, femoral, profunda femoral, popliteal and calf veins including the posterior tibial,  peroneal and gastrocnemius veins when visible. Spectral Doppler was utilized to evaluate flow at rest and with distal augmentation maneuvers in the common femoral, femoral and popliteal veins. COMPARISON:  None Available. FINDINGS: RIGHT LOWER EXTREMITY Common Femoral Vein: No evidence of thrombus. Normal compressibility, respiratory phasicity and response to augmentation. Saphenofemoral Junction: No evidence of thrombus. Normal compressibility and flow on color Doppler imaging. Profunda Femoral Vein: No evidence of thrombus. Normal compressibility and flow on color Doppler imaging. Femoral Vein: No evidence of thrombus. Normal compressibility, respiratory phasicity and response to augmentation. Popliteal Vein: No evidence of thrombus. Normal compressibility, respiratory phasicity and response to augmentation. Calf Veins: No evidence of thrombus. Normal compressibility and flow on color Doppler imaging. LEFT LOWER EXTREMITY Common Femoral Vein: No evidence of thrombus. Normal compressibility, respiratory phasicity and response to augmentation. Saphenofemoral Junction: No evidence of thrombus. Normal compressibility and flow on color Doppler imaging. Profunda Femoral Vein: No evidence of thrombus. Normal compressibility and flow on color Doppler imaging. Femoral Vein: No evidence of thrombus. Normal compressibility, respiratory phasicity and response to augmentation. Popliteal Vein: No evidence of thrombus. Normal compressibility, respiratory phasicity and response to augmentation. Calf Veins: No evidence of thrombus. Normal compressibility and flow on color Doppler imaging. IMPRESSION: No evidence of deep venous thrombosis in either lower extremity. Electronically Signed   By: Jerilynn Mages.  Shick M.D.   On: 10/21/2022 14:44   MR LUMBAR SPINE WO CONTRAST  Result Date: 10/21/2022  CLINICAL DATA:  Initial evaluation for low back pain. EXAM: MRI LUMBAR SPINE WITHOUT CONTRAST TECHNIQUE: Multiplanar, multisequence MR imaging of  the lumbar spine was performed. No intravenous contrast was administered. COMPARISON:  Prior MRI from 04/25/2019. FINDINGS: Segmentation: Standard. Lowest well-formed disc space labeled the L5-S1 level. Alignment: 4 mm facet mediated anterolisthesis of L4 on L5, stable. Alignment otherwise normal preservation of the normal lumbar lordosis. Vertebrae: Vertebral body height maintained without acute or chronic fracture. Bone marrow signal intensity within normal limits. No worrisome osseous lesions. Discogenic reactive endplate change with mild marrow edema present about the T10-11, L4-5, and L5-S1 interspaces. No other abnormal marrow edema. Conus medullaris and cauda equina: Conus extends to the T12 level. Conus and cauda equina appear normal. Paraspinal and other soft tissues: Unremarkable. Disc levels: T10-11: Seen only on sagittal projection. Disc desiccation with diffuse disc bulge. Reactive endplate spurring. No significant spinal stenosis. Mild to moderate bilateral foraminal narrowing. T11-12: Unremarkable. T12-L1: Unremarkable. L1-2:  Unremarkable. L2-3: Minimal left eccentric disc bulge. No spinal stenosis. Foramina remain patent. L3-4: Mild annular disc bulging. Mild to moderate facet hypertrophy. Associated prominent joint effusions. Borderline mild narrowing of the lateral recesses bilaterally. Central canal remains patent. Mild bilateral L3 foraminal narrowing. L4-5: 4 mm anterolisthesis. Disc desiccation with broad posterior pseudo disc bulge/uncovering, asymmetric to the right. Associated slight superior migration. Severe bilateral facet arthrosis. Prominent joint effusion on the right. Resultant mild bilateral subarticular stenosis. Central canal remains patent. Moderate right worse than left L4 foraminal stenosis. L5-S1: Degenerative intervertebral disc space narrowing with diffuse disc bulge and disc desiccation. Reactive endplate change with marginal endplate osteophytic spurring. Superimposed  shallow right subarticular disc protrusion with annular fissure mildly indents the right ventral thecal sac (series 12, image 32). Mild facet hypertrophy. No significant spinal stenosis. Moderate bilateral L5 foraminal narrowing. IMPRESSION: 1. No acute abnormality within the lumbar spine. 2. 4 mm facet mediated anterolisthesis of L4 on L5 with resultant mild bilateral subarticular stenosis, with moderate right worse than left L4 foraminal narrowing. 3. Moderate bilateral L5 foraminal stenosis related to disc bulge, endplate change, and facet hypertrophy. 4. Mild to moderate bilateral L3 foraminal stenosis related to disc bulge and facet hypertrophy. Electronically Signed   By: Jeannine Boga M.D.   On: 10/21/2022 00:37   DG Pelvis 1-2 Views  Result Date: 10/20/2022 CLINICAL DATA:  Status post fall 1 month ago. EXAM: PELVIS - 1-2 VIEW COMPARISON:  None Available. FINDINGS: There is no evidence of an acute pelvic fracture or diastasis. Chronic versus degenerative changes are seen involving the left inferior pubic ramus. IMPRESSION: Chronic versus degenerative changes involving the left inferior pubic ramus. Electronically Signed   By: Virgina Norfolk M.D.   On: 10/20/2022 22:47    Assessment and Plan  62 y.o. female with medical history significant for CKD 4 with anemia of CKD, obesity with BMI over 60, OSA, depression, chronic back pain due to spinal stenosis with neurogenic claudication presented with worsening hip pain and rectal bleeding.  On presentation, hemoglobin was 9.5 down from baseline of 11.5-12 with creatinine of 2.46, up from baseline of around 1.86 and bicarb of 18. Pelvic x-ray showing chronic degenerative changes of left inferior pubic ramus.  GI was consulted.   Assessment & Plan:   Hematochezia/possible lower GI bleeding--suspected from Hemorrhoidal bleeding (per colonoscopy) Symptomatic anemia Anemia of chronic disease from chronic kidney disease -Presented with intermittent  rectal bleeding with hemoglobin of 9.5, down from baseline of 11.6 in October 2023 -hgb stable at  8.9 --Colonoscopy The perianal and digital rectal examinations were normal.      Non-bleeding internal hemorrhoids were found during retroflexion. The       hemorrhoids were large and Grade I (internal hemorrhoids that do not       prolapse).      The exam was otherwise without abnormality on direct and retroflexion  --Anusol supp bid   Acute kidney injury on CKD stage IV Acute metabolic acidosis--resolved -Creatinine 2.46 up from baseline of 1.86 a few months prior with bicarb of 18 on presentation - received IV fluids. -co2 normal and creat going down   Chronic back pain Spinal stenosis with neurogenic claudication Ambulatory dysfunction -MRI of lumbar spine showed no acute abnormality within the lumbar spine; showed chronic spinal stenosis. --  PT eval recommends rehab  -Patient interested in SNF placement.  TOC consult -Continue gabapentin and current pain management   Class III severe obesity -Outpatient follow-up   Depression -Continue fluoxetine, hydroxyzine and bupropion   OSA -CPAP if desired    Procedures: colonoscopy Family communication : family at bedside Consults : G.I. CODE STATUS: full DVT Prophylaxis : SCD Level of care: Telemetry Medical Status is: Inpatient Remains inpatient appropriate because: medical workup completed. TOC for discharge planning to rehab. Awaiting bed and insurance of    TOTAL TIME TAKING CARE OF THIS PATIENT: 35 minutes.  >50% time spent on counselling and coordination of care  Note: This dictation was prepared with Dragon dictation along with smaller phrase technology. Any transcriptional errors that result from this process are unintentional.  Fritzi Mandes M.D    [facility]   CC: Primary care physician; Gwyneth Sprout, FNP

## 2022-10-22 NOTE — Anesthesia Preprocedure Evaluation (Signed)
Anesthesia Evaluation  Patient identified by MRN, date of birth, ID band Patient awake    Reviewed: Allergy & Precautions, NPO status , Patient's Chart, lab work & pertinent test results  Airway Mallampati: II  TM Distance: >3 FB Neck ROM: Full    Dental  (+) Teeth Intact   Pulmonary neg pulmonary ROS, sleep apnea    Pulmonary exam normal  + decreased breath sounds      Cardiovascular Exercise Tolerance: Poor hypertension, Pt. on medications negative cardio ROS Normal cardiovascular exam Rhythm:Regular     Neuro/Psych   Anxiety Depression    negative neurological ROS  negative psych ROS   GI/Hepatic negative GI ROS, Neg liver ROS,,,  Endo/Other  negative endocrine ROS  Morbid obesity  Renal/GU negative Renal ROS  negative genitourinary   Musculoskeletal   Abdominal  (+) + obese  Peds negative pediatric ROS (+)  Hematology negative hematology ROS (+) Blood dyscrasia, anemia   Anesthesia Other Findings Past Medical History: No date: Allergy No date: Anxiety No date: Depression No date: Hyperlipidemia No date: Hypertension  Past Surgical History: No date: ABDOMINAL HYSTERECTOMY     Comment:  still has ovaries 12/23/2019: COLONOSCOPY WITH PROPOFOL; N/A     Comment:  Procedure: COLONOSCOPY WITH PROPOFOL;  Surgeon: Jonathon Bellows, MD;  Location: [facility] ENDOSCOPY;  Service:               Gastroenterology;  Laterality: N/A;  BMI    Body Mass Index: 62.00 kg/m      Reproductive/Obstetrics negative OB ROS                             Anesthesia Physical Anesthesia Plan  ASA: 3  Anesthesia Plan: General   Post-op Pain Management:    Induction: Intravenous  PONV Risk Score and Plan: Propofol infusion and TIVA  Airway Management Planned: Natural Airway  Additional Equipment:   Intra-op Plan:   Post-operative Plan:   Informed Consent: I have reviewed the patients  History and Physical, chart, labs and discussed the procedure including the risks, benefits and alternatives for the proposed anesthesia with the patient or authorized representative who has indicated his/her understanding and acceptance.     Dental Advisory Given  Plan Discussed with: CRNA and Surgeon  Anesthesia Plan Comments:        Anesthesia Quick Evaluation

## 2022-10-22 NOTE — Progress Notes (Signed)
Physical Therapy Treatment Patient Details Name: Ashley Mccullough MRN: [id number] DOB: 10-20-1960 Today's Date: 10/22/2022   History of Present Illness 62 y.o. female with medical history significant for CKD 4 with anemia of CKD, obesity with BMI over 60, OSA, depression, chronic back pain due to spinal stenosis with neurogenic claudication presented with worsening hip pain and rectal bleeding.  On presentation, hemoglobin was 9.5    PT Comments    Pt eager to work with PT but continues to show poor overall tolerance for prolonged standing, sitting or ability to maintain upright posture.  She was able to do ~40 ft of ambulation but was very fatigue (O2 down to 88%) and showed excessive reliance on the walker.  Pt c/o significant pain in b/l LEs that increases with increasing activity.  Continue with POC to address functional limitations and activity tolerance.   Recommendations for follow up therapy are one component of a multi-disciplinary discharge planning process, led by the attending physician.  Recommendations may be updated based on patient status, additional functional criteria and insurance authorization.  Follow Up Recommendations  Skilled nursing-short term rehab (<3 hours/day) Can patient physically be transported by private vehicle: No   Assistance Recommended at Discharge Frequent or constant Supervision/Assistance  Patient can return home with the following A lot of help with walking and/or transfers;A little help with bathing/dressing/bathroom;Assistance with cooking/housework;Assist for transportation;Help with stairs or ramp for entrance   Equipment Recommendations  None recommended by PT    Recommendations for Other Services       Precautions / Restrictions Precautions Precautions: Fall Restrictions Weight Bearing Restrictions: No     Mobility  Bed Mobility Overal bed mobility: Needs Assistance Bed Mobility: Supine to Sit, Sit to Supine     Supine to sit:  Min guard Sit to supine: Min assist   General bed mobility comments: heavy use of UEs/rails to rise; despite much effort unable to get LEs up into bed needing minassist to get back to supine    Transfers Overall transfer level: Needs assistance Equipment used: Rolling walker (2 wheels) Transfers: Sit to/from Stand Sit to Stand: Min assist, From elevated surface           General transfer comment: heavy cuing and light phyiscal assist to insure forward momentum to attain standing.  Pt self selects forward flexed /leaning on walker posture, but does temporarily attain more upright stance with heavy cuing    Ambulation/Gait Ambulation/Gait assistance: Min assist Gait Distance (Feet): 40 Feet Assistive device: Rolling walker (2 wheels)         General Gait Details: Pt with very labored, forward flexed ambulation (forearms leaning on walker as she fatigued). again fatigued quickly but even more limited by increasing pain in b/l LEs   Stairs             Wheelchair Mobility    Modified Rankin (Stroke Patients Only)       Balance Overall balance assessment: Needs assistance Sitting-balance support: No upper extremity supported Sitting balance-Leahy Scale: Good Sitting balance - Comments: Pt was able to maitnain sitting but c/o increasing pain in LEs with the EOB position   Standing balance support: Bilateral upper extremity supported Standing balance-Leahy Scale: Fair Standing balance comment: highly reliant on walker, poor standing tolerance, unable to maintain upright posture                            Cognition Arousal/Alertness: Awake/alert Behavior During Therapy: [facility]  for tasks assessed/performed Overall Cognitive Status: Within Functional Limits for tasks assessed                                          Exercises      General Comments General comments (skin integrity, edema, etc.): Pt plesant and motivated but ultimately  displays little activity tolerance.  Sat in recliner <1 minute before requesting to get back to bed 2/2 back/leg pain      Pertinent Vitals/Pain Pain Assessment Pain Assessment: 0-10 Pain Score: 6  Pain Location: b/l buttocks and down posterior LEs    Home Living                          Prior Function            PT Goals (current goals can now be found in the care plan section) Progress towards PT goals: Progressing toward goals    Frequency    Min 2X/week      PT Plan Current plan remains appropriate    Co-evaluation              AM-PAC PT "6 Clicks" Mobility   Outcome Measure  Help needed turning from your back to your side while in a flat bed without using bedrails?: A Little Help needed moving from lying on your back to sitting on the side of a flat bed without using bedrails?: A Little Help needed moving to and from a bed to a chair (including a wheelchair)?: A Little Help needed standing up from a chair using your arms (e.g., wheelchair or bedside chair)?: A Little Help needed to walk in hospital room?: A Lot Help needed climbing 3-5 steps with a railing? : Total 6 Click Score: 15    End of Session Equipment Utilized During Treatment: Gait belt Activity Tolerance: Patient limited by pain;Patient limited by fatigue Patient left: with call bell/phone within reach;with bed alarm set   PT Visit Diagnosis: Muscle weakness (generalized) (M62.81);Pain;Difficulty in walking, not elsewhere classified (R26.2) Pain - Right/Left: Left Pain - part of body: Leg     Time: 4917-9150 PT Time Calculation (min) (ACUTE ONLY): 18 min  Charges:  $Gait Training: 8-22 mins                     Kreg Shropshire, DPT 10/22/2022, 6:11 PM

## 2022-10-22 NOTE — Anesthesia Postprocedure Evaluation (Signed)
Anesthesia Post Note  Patient: Ashley Mccullough  Procedure(s) Performed: COLONOSCOPY WITH PROPOFOL  Patient location during evaluation: PACU Anesthesia Type: General Level of consciousness: awake and awake and alert Pain management: satisfactory to patient Vital Signs Assessment: vitals unstable Respiratory status: spontaneous breathing and nonlabored ventilation Cardiovascular status: stable Anesthetic complications: no  No notable events documented.   Last Vitals:  Vitals:   10/22/22 1400 10/22/22 1405  BP:  (!) 154/88  Pulse: 77 77  Resp: 16 18  Temp:    SpO2: 99% 98%    Last Pain:  Vitals:   10/22/22 1312  TempSrc: Temporal  PainSc: 0-No pain                 VAN STAVEREN,Lucio Litsey

## 2022-10-22 NOTE — H&P (Signed)
Jonathon Bellows, MD [address], [place], [place], [place], [address] [address], [place], [place], [place], [address] Phone: [phone]  Fax: [phone]  Primary Care Physician:  Gwyneth Sprout, FNP   Pre-Procedure History & Physical: HPI:  Ashley Mccullough is a 62 y.o. female is here for an colonoscopy.   Past Medical History:  Diagnosis Date   Allergy    Anxiety    Depression    Hyperlipidemia    Hypertension     Past Surgical History:  Procedure Laterality Date   ABDOMINAL HYSTERECTOMY     still has ovaries   COLONOSCOPY WITH PROPOFOL N/A 12/23/2019   Procedure: COLONOSCOPY WITH PROPOFOL;  Surgeon: Jonathon Bellows, MD;  Location: [facility] ENDOSCOPY;  Service: Gastroenterology;  Laterality: N/A;    Prior to Admission medications   Medication Sig Start Date End Date Taking? Authorizing Provider  acetaminophen (TYLENOL) 650 MG CR tablet Take 650-1,300 mg by mouth at bedtime. As needed for arthritis.   Yes [provider]  buPROPion (WELLBUTRIN XL) 150 MG 24 hr tablet Take 2 tablets (300 mg total) by mouth daily. 06/02/22  Yes Tally Joe T, FNP  calcitRIOL (ROCALTROL) 0.25 MCG capsule TAKE 1 CAPSULE BY MOUTH 1 TIME EACH DAY 10/06/22  Yes Tally Joe T, FNP  Cetirizine HCl 10 MG CAPS Take 1 capsule by mouth daily. 07/30/16  Yes [provider]  Cholecalciferol (VITAMIN D3) 5000 units TABS Take 5,000 tablets by mouth daily.   Yes [provider]  COLLAGEN PO Take by mouth.   Yes [provider]  FLUoxetine (PROZAC) 20 MG capsule Take 1 capsule (20 mg total) by mouth daily. Take once daily with 40 mg dose for total of 60 mg. 06/02/22  Yes Gwyneth Sprout, FNP  FLUoxetine (PROZAC) 40 MG capsule Take 1 capsule (40 mg total) by mouth daily. Take once daily with 20 mg dose for total of 60 mg. 06/02/22  Yes Tally Joe T, FNP  gabapentin (NEURONTIN) 300 MG capsule TAKE TAKE 2 CAPSULES BY MOUTH 3 TIMES A DAY 08/18/22  Yes Tally Joe T, FNP   hydrALAZINE (APRESOLINE) 10 MG tablet Take 1 tablet (10 mg total) by mouth 3 (three) times daily. 06/02/22  Yes Tally Joe T, FNP  hydrOXYzine (VISTARIL) 25 MG capsule TAKE 1 CAPSULE (25 MG TOTAL) BY MOUTH EVERY 8 (EIGHT) HOURS AS NEEDED. 09/09/22  Yes Gwyneth Sprout, FNP  losartan-hydrochlorothiazide (HYZAAR) 100-25 MG tablet Take 1 tablet by mouth daily. 06/02/22  Yes Gwyneth Sprout, FNP  meloxicam (MOBIC) 15 MG tablet Take 15 mg by mouth daily. 06/17/22  Yes [provider]  metoprolol succinate (TOPROL-XL) 25 MG 24 hr tablet TAKE 1 TABLET BY MOUTH DAILY 06/02/22  Yes Tally Joe T, FNP  montelukast (SINGULAIR) 10 MG tablet Take 1 tablet (10 mg total) by mouth daily. 06/02/22  Yes Gwyneth Sprout, FNP  benzonatate (TESSALON) 100 MG capsule Take 1 capsule (100 mg total) by mouth 3 (three) times daily as needed. Patient not taking: Reported on 10/21/2022 09/19/22   Mar Daring, PA-C    Allergies as of 10/20/2022 - Review Complete 10/20/2022  Allergen Reaction Noted   Cashew nut oil Hives, Itching, and Swelling 01/12/2020   Latex Itching 07/30/2016   Norvasc [amlodipine] Other (See Comments) 08/05/2021   Simvastatin  05/29/2015   Sulfa antibiotics  05/29/2015   Levofloxacin Rash 08/01/2016   Prednisone Rash 08/01/2016    Family History  Problem Relation Age of  Onset   Cancer Mother        breast   Breast cancer Mother    Depression Father    Heart disease Father    Alcohol abuse Father    Hypertension Sister    Diabetes Brother    Diabetes Brother    Breast cancer Maternal Aunt    Breast cancer Cousin     Social History   Socioeconomic History   Marital status: Single    Spouse name: Not on file   Number of children: Not on file   Years of education: Not on file   Highest education level: Not on file  Occupational History   Not on file  Tobacco Use   Smoking status: Never   Smokeless tobacco: Never  Vaping Use   Vaping Use: Never used  Substance and  Sexual Activity   Alcohol use: Not Currently   Drug use: No   Sexual activity: Not on file  Other Topics Concern   Not on file  Social History Narrative   Not on file   Social Determinants of Health   Financial Resource Strain: Not on file  Food Insecurity: No Food Insecurity (10/21/2022)   Hunger Vital Sign    Worried About Running Out of Food in the Last Year: Never true    Ran Out of Food in the Last Year: Never true  Transportation Needs: No Transportation Needs (10/21/2022)   PRAPARE - Hydrologist (Medical): No    Lack of Transportation (Non-Medical): No  Physical Activity: Not on file  Stress: No Stress Concern Present (03/04/2022)   [place]    Feeling of Stress : Not at all  Social Connections: Socially Isolated (03/04/2022)   Social Connection and Isolation Panel [NHANES]    Frequency of Communication with Friends and Family: More than three times a week    Frequency of Social Gatherings with Friends and Family: Twice a week    Attends Religious Services: Never    Marine scientist or Organizations: No    Attends Archivist Meetings: Never    Marital Status: Never married  Intimate Partner Violence: Unknown (10/21/2022)   Humiliation, Afraid, Rape, and Kick questionnaire    Fear of Current or Ex-Partner: Patient refused    Emotionally Abused: Patient refused    Physically Abused: Patient refused    Sexually Abused: Patient refused    Review of Systems: See HPI, otherwise negative ROS  Physical Exam: BP (!) 141/72   Pulse 67   Temp (!) 97.4 F (36.3 C) (Temporal)   Resp 20   Ht 5\' 2"  (1.575 m)   Wt (!) 161.4 kg   SpO2 99%   BMI 65.08 kg/m  General:   Alert,  pleasant and cooperative in NAD Head:  Normocephalic and atraumatic. Neck:  Supple; no masses or thyromegaly. Lungs:  Clear throughout to auscultation, normal respiratory effort.    Heart:  +S1,  +S2, Regular rate and rhythm, No edema. Abdomen:  Soft, nontender and nondistended. Normal bowel sounds, without guarding, and without rebound.   Neurologic:  Alert and  oriented x4;  grossly normal neurologically.  Impression/Plan: Ashley Mccullough is here for an colonoscopy to be performed for rectal bleeding .  Risks, benefits, limitations, and alternatives regarding  colonoscopy have been reviewed with the patient.  Questions have been answered.  All parties agreeable.   Jonathon Bellows, MD  10/22/2022, 1:19 PM

## 2022-10-22 NOTE — Transfer of Care (Signed)
Immediate Anesthesia Transfer of Care Note  Patient: Ashley Mccullough  Procedure(s) Performed: COLONOSCOPY WITH PROPOFOL  Patient Location: PACU  Anesthesia Type:General  Level of Consciousness: drowsy  Airway & Oxygen Therapy: Patient Spontanous Breathing  Post-op Assessment: Report given to RN and Post -op Vital signs reviewed and stable  Post vital signs: Reviewed and stable  Last Vitals:  Vitals Value Taken Time  BP 134/62 10/22/22 1344  Temp 97   Pulse 77 10/22/22 1347  Resp 20 10/22/22 1347  SpO2 99 % 10/22/22 1347  Vitals shown include unvalidated device data.  Last Pain:  Vitals:   10/22/22 1312  TempSrc: Temporal  PainSc: 0-No pain         Complications: No notable events documented.

## 2022-10-22 NOTE — Op Note (Signed)
[facility] Gastroenterology Patient Name: Ashley Mccullough Procedure Date: 10/22/2022 12:21 PM MRN: [id number] Account #: [id number] Date of Birth: 02-14-1961 Admit Type: Inpatient Age: 62 Room: [facility] ENDO ROOM 3 Gender: Female Note Status: Finalized Instrument Name: Park Meo [id number] Procedure:             Colonoscopy Indications:           Rectal bleeding Providers:             Jonathon Bellows MD, MD Referring MD:          Jaci Standard. Rollene Rotunda (Referring MD) Medicines:             Monitored Anesthesia Care Complications:         No immediate complications. Procedure:             Pre-Anesthesia Assessment:                        - Prior to the procedure, a History and Physical was                         performed, and patient medications, allergies and                         sensitivities were reviewed. The patient's tolerance                         of previous anesthesia was reviewed.                        - The risks and benefits of the procedure and the                         sedation options and risks were discussed with the                         patient. All questions were answered and informed                         consent was obtained.                        - ASA Grade Assessment: II - A patient with mild                         systemic disease.                        After obtaining informed consent, the colonoscope was                         passed under direct vision. Throughout the procedure,                         the patient's blood pressure, pulse, and oxygen                         saturations were monitored continuously. The                         Colonoscope was introduced through the anus  and                         advanced to the the cecum, identified by the                         appendiceal orifice. The colonoscopy was performed                         with ease. The patient tolerated the procedure well.                         The quality  of the bowel preparation was excellent.                         The ileocecal valve, appendiceal orifice, and rectum                         were photographed. Findings:      The perianal and digital rectal examinations were normal.      Non-bleeding internal hemorrhoids were found during retroflexion. The       hemorrhoids were large and Grade I (internal hemorrhoids that do not       prolapse).      The exam was otherwise without abnormality on direct and retroflexion       views. Impression:            - Non-bleeding internal hemorrhoids.                        - The examination was otherwise normal on direct and                         retroflexion views.                        - No specimens collected. Recommendation:        - Discharge patient to home (with escort).                        - Resume previous diet.                        - Continue present medications. Procedure Code(s):     --- Professional ---                        [phone], Colonoscopy, flexible; diagnostic, including                         collection of specimen(s) by brushing or washing, when                         performed (separate procedure) Diagnosis Code(s):     --- Professional ---                        K64.0, First degree hemorrhoids                        K62.5, Hemorrhage of anus and rectum CPT copyright 2022 American Medical Association. All rights reserved. The codes  documented in this report are preliminary and upon coder review may  be revised to meet current compliance requirements. Jonathon Bellows, MD Jonathon Bellows MD, MD 10/22/2022 1:39:11 PM This report has been signed electronically. Number of Addenda: 0 Note Initiated On: 10/22/2022 12:21 PM Scope Withdrawal Time: 0 hours 6 minutes 23 seconds  Total Procedure Duration: 0 hours 7 minutes 33 seconds  Estimated Blood Loss:  Estimated blood loss: none.      [facility]

## 2022-10-22 NOTE — Care Plan (Signed)
PATIENT S/P COLONOSCOPY: Report called to RN Gerald Stabs on unit See EPIC for full reports - / print out located in chart Patient alert and oriented AVSS

## 2022-10-22 NOTE — TOC Initial Note (Signed)
Transition of Care [facility]) - Initial/Assessment Note    Patient Details  Name: Ashley Mccullough MRN: [id number] Date of Birth: December 31, 1960  Transition of [facility] CM/SW Contact:    Gerilyn Pilgrim, LCSW Phone Number: 10/22/2022, 11:22 AM  Clinical Narrative:  SW spoke with patient regarding need for rehab. Pt reports that she is agreeable as she was in a third level apartment and unable to get up and down stairs. Pt reports she would like for the decision to be made by her aunt and sister and then would like to be notified regarding which SNF they would prefer. She reports her family has been working on finding a more suitable apartment for her in a senior living community. CSW spoke with patients aunt who states she would like me to connect with patients sister [name] to make the decision.                Expected Discharge Plan: Skilled Nursing Facility Barriers to Discharge: Continued Medical Work up   Patient Goals and CMS Choice Patient states their goals for this hospitalization and ongoing recovery are:: go to rehab CMS Medicare.gov Compare Post Acute Care list provided to:: Patient Represenative (must comment) (patients aunt christine) Choice offered to / list presented to : Patient, HC POA / Guardian      Expected Discharge Plan and Services       Living arrangements for the past 2 months: Apartment                                      Prior Living Arrangements/Services Living arrangements for the past 2 months: Apartment Lives with:: Self Patient language and need for interpreter reviewed:: Yes Do you feel safe going back to the place where you live?: Yes      Need for Family Participation in Patient Care: Yes (Comment) Care giver support system in place?: Yes (comment)   Criminal Activity/Legal Involvement Pertinent to Current Situation/Hospitalization: No - Comment as needed  Activities of Daily Living Home Assistive Devices/Equipment: Cane (specify  quad or straight), Walker (specify type) ADL Screening (condition at time of admission) Patient's cognitive ability adequate to safely complete daily activities?: Yes Is the patient deaf or have difficulty hearing?: No Does the patient have difficulty seeing, even when wearing glasses/contacts?: No Does the patient have difficulty concentrating, remembering, or making decisions?: No Patient able to express need for assistance with ADLs?: Yes Does the patient have difficulty dressing or bathing?: No Independently performs ADLs?: Yes (appropriate for developmental age) Does the patient have difficulty walking or climbing stairs?: Yes Weakness of Legs: Both Weakness of Arms/Hands: None  Permission Sought/Granted      Share Information with NAME: aunt and sister  Permission granted to share info w AGENCY: SNF'S in area  Permission granted to share info w Relationship: aunt and sister  Permission granted to share info w Contact Information: aunt and sister  Emotional Assessment       Orientation: : Oriented to Self, Oriented to Place, Oriented to  Time, Oriented to Situation      Admission diagnosis:  Hematochezia [K92.1] Low back pain, unspecified back pain laterality, unspecified chronicity, unspecified whether sciatica present [M54.50] Patient Active Problem List   Diagnosis Date Noted   Self-care deficit for feeding, bathing, and toileting 10/20/2022   Personal history of fall 10/20/2022   Stage 3b chronic kidney disease ([place]) 10/20/2022   Hypertension  associated with diabetes ([place]) 10/20/2022   Hyperlipidemia associated with type 2 diabetes mellitus ([place]) 10/20/2022   Acute renal failure superimposed on stage 4 chronic kidney disease ([place]) 10/20/2022   Chronic back pain 10/20/2022   Ambulatory dysfunction 10/20/2022   OSA (obstructive sleep apnea) 10/20/2022   Symptomatic anemia 10/20/2022   Hematochezia 10/20/2022   Panniculitis 06/02/2022   Need for influenza  vaccination 06/02/2022   Mixed hyperlipidemia 06/02/2022   Anxiety 06/02/2022   Annual physical exam 10/17/2021   Depression, major, single episode, severe ([place]) 10/17/2021   Anemia of chronic kidney failure, stage 4 (severe) ([place]) 10/17/2021   CKD (chronic kidney disease) stage 4, GFR 15-29 ml/min ([place]) 06/06/2021   Hypersomnia 06/05/2021   Lumbar radiculitis 06/05/2021   Encounter for screening mammogram for malignant neoplasm of breast 06/05/2021   Chronic pain syndrome 04/11/2021   Bilateral primary osteoarthritis of knee 04/11/2021   Lumbar spondylosis 04/11/2021   Sacroiliac joint pain 04/11/2021   Chronic pain of both knees 06/21/2020   Chronic radicular lumbar pain 11/18/2019   Spinal stenosis of lumbar region with neurogenic claudication 11/18/2019   DDD (degenerative disc disease), lumbosacral 11/18/2019   Mild episode of recurrent major depressive disorder ([place]) 10/26/2018   BMI 60.0-69.9, adult ([place]) 07/01/2017   Class 3 severe obesity with body mass index (BMI) of 60.0 to 69.9 in adult ([place]) 12/24/2015   Anxiety, generalized 05/29/2015   D (diarrhea) 05/29/2015   Avitaminosis D 05/29/2015   Prediabetes 05/29/2015   Fatigue 05/29/2015   Depression 03/15/2015   Cannot sleep 11/13/2009   Congenital pes planus 03/13/2008   Hypercholesterolemia without hypertriglyceridemia 09/16/2006   PCP:  Gwyneth Sprout, FNP Pharmacy:   [id number] - [place], [place] [address] [place] [address] Phone: [phone] Fax: [phone]     Social Determinants of Health (SDOH) Social History: SDOH Screenings   Food Insecurity: No Food Insecurity (10/21/2022)  Housing: Low Risk  (10/21/2022)  Transportation Needs: No Transportation Needs (10/21/2022)  Utilities: Unknown (10/21/2022)  Alcohol Screen: Low Risk  (06/02/2022)  Depression (PHQ2-9): High Risk (06/02/2022)  Social Connections: Socially Isolated (03/04/2022)  Stress: No Stress Concern Present  (03/04/2022)  Tobacco Use: Low Risk  (10/20/2022)   SDOH Interventions:     Readmission Risk Interventions     No data to display

## 2022-10-22 NOTE — Progress Notes (Signed)
PT Cancellation Note  Patient Details Name: Ashley Mccullough MRN: [id number] DOB: Oct 21, 1960   Cancelled Treatment:    Reason Eval/Treat Not Completed: Patient at procedure or test/unavailable Pt was out of room earlier this date for colonoscopy, attempted to see ~15:30 pt eating and not wanting PT at this time.  Will try back as time allows and pt is available.  Kreg Shropshire, DPT 10/22/2022, 3:36 PM

## 2022-10-23 ENCOUNTER — Encounter: Payer: Self-pay | Admitting: Gastroenterology

## 2022-10-23 DIAGNOSIS — N179 Acute kidney failure, unspecified: Secondary | ICD-10-CM | POA: Diagnosis not present

## 2022-10-23 DIAGNOSIS — K921 Melena: Secondary | ICD-10-CM | POA: Diagnosis not present

## 2022-10-23 DIAGNOSIS — D649 Anemia, unspecified: Secondary | ICD-10-CM | POA: Diagnosis not present

## 2022-10-23 MED ORDER — LOSARTAN POTASSIUM 50 MG PO TABS
100.0000 mg | ORAL_TABLET | Freq: Every day | ORAL | Status: DC
  Administered 2022-10-23 – 2022-10-30 (×8): 100 mg via ORAL
  Filled 2022-10-23 (×8): qty 2

## 2022-10-23 MED ORDER — MONTELUKAST SODIUM 10 MG PO TABS
10.0000 mg | ORAL_TABLET | Freq: Every day | ORAL | Status: DC
  Administered 2022-10-23 – 2022-10-29 (×7): 10 mg via ORAL
  Filled 2022-10-23 (×7): qty 1

## 2022-10-23 MED ORDER — VITAMIN D 25 MCG (1000 UNIT) PO TABS
5000.0000 [IU] | ORAL_TABLET | Freq: Every day | ORAL | Status: DC
  Administered 2022-10-23 – 2022-10-30 (×8): 5000 [IU] via ORAL
  Filled 2022-10-23 (×8): qty 5

## 2022-10-23 MED ORDER — GABAPENTIN 300 MG PO CAPS: 300.0000 mg | ORAL_CAPSULE | Freq: Three times a day (TID) | ORAL | Status: DC

## 2022-10-23 MED ORDER — HYDROCHLOROTHIAZIDE 25 MG PO TABS
25.0000 mg | ORAL_TABLET | Freq: Every day | ORAL | Status: DC
  Administered 2022-10-23 – 2022-10-30 (×8): 25 mg via ORAL
  Filled 2022-10-23 (×8): qty 1

## 2022-10-23 MED ORDER — TRAMADOL HCL 50 MG PO TABS
50.0000 mg | ORAL_TABLET | Freq: Three times a day (TID) | ORAL | Status: DC | PRN
  Administered 2022-10-23 – 2022-10-30 (×12): 50 mg via ORAL
  Filled 2022-10-23 (×12): qty 1

## 2022-10-23 MED ORDER — LOSARTAN POTASSIUM-HCTZ 100-25 MG PO TABS: 1.0000 | ORAL_TABLET | Freq: Every day | ORAL | Status: DC

## 2022-10-23 MED ORDER — POLYETHYLENE GLYCOL 3350 17 G PO PACK
17.0000 g | PACK | Freq: Two times a day (BID) | ORAL | Status: DC
  Administered 2022-10-23 – 2022-10-30 (×6): 17 g via ORAL
  Filled 2022-10-23 (×11): qty 1

## 2022-10-23 NOTE — Progress Notes (Signed)
[place] [facility] [place] NAME: Ashley Mccullough    MR#:  [id number]  DATE OF BIRTH:  05/09/1961  SUBJECTIVE:  Patient sitting out int he chair No more rectal bleed. Found to have large internal hemorrhoids.  No abdominal pain. Tolerating PO diet   VITALS:  Blood pressure 131/64, pulse 80, temperature 98.6 F (37 C), temperature source Oral, resp. rate 16, height 5\' 2"  (1.575 m), weight (!) 161.4 kg, SpO2 93 %.  PHYSICAL EXAMINATION:   GENERAL:  62 y.o.-year-old patient with no acute distress. Morbid obese LUNGS: Normal breath sounds bilaterally, no wheezing CARDIOVASCULAR: S1, S2 normal. No murmur   ABDOMEN: Soft, nontender, nondistended. Bowel sounds present.  EXTREMITIES: No  edema b/l.    NEUROLOGIC: nonfocal  patient is alert and awake SKIN: per RN  LABORATORY PANEL:  CBC Recent Labs  Lab 10/22/22 0159  WBC 8.2  HGB 8.9*  HCT 29.3*  PLT 248     Chemistries  Recent Labs  Lab 10/20/22 2044 10/22/22 0159  NA 135 137  K 4.8 4.1  CL 109 110  CO2 18* 23  GLUCOSE 79 105*  BUN 31* 24*  CREATININE 2.46* 2.21*  CALCIUM 8.5* 8.7*  MG  --  2.0  AST 20  --   ALT 16  --   ALKPHOS 43  --   BILITOT 0.9  --     Cardiac Enzymes No results for input(s): "TROPONINI" in the last 168 hours. RADIOLOGY:  US Venous Img Lower Bilateral (DVT)  Result Date: 10/21/2022 CLINICAL DATA:  Swelling and pain, recent fall EXAM: BILATERAL LOWER EXTREMITY VENOUS DOPPLER ULTRASOUND TECHNIQUE: Gray-scale sonography with graded compression, as well as color Doppler and duplex ultrasound were performed to evaluate the lower extremity deep venous systems from the level of the common femoral vein and including the common femoral, femoral, profunda femoral, popliteal and calf veins including the posterior tibial, peroneal and gastrocnemius veins when visible. Spectral Doppler was utilized to evaluate flow at rest and with distal augmentation maneuvers in the  common femoral, femoral and popliteal veins. COMPARISON:  None Available. FINDINGS: RIGHT LOWER EXTREMITY Common Femoral Vein: No evidence of thrombus. Normal compressibility, respiratory phasicity and response to augmentation. Saphenofemoral Junction: No evidence of thrombus. Normal compressibility and flow on color Doppler imaging. Profunda Femoral Vein: No evidence of thrombus. Normal compressibility and flow on color Doppler imaging. Femoral Vein: No evidence of thrombus. Normal compressibility, respiratory phasicity and response to augmentation. Popliteal Vein: No evidence of thrombus. Normal compressibility, respiratory phasicity and response to augmentation. Calf Veins: No evidence of thrombus. Normal compressibility and flow on color Doppler imaging. LEFT LOWER EXTREMITY Common Femoral Vein: No evidence of thrombus. Normal compressibility, respiratory phasicity and response to augmentation. Saphenofemoral Junction: No evidence of thrombus. Normal compressibility and flow on color Doppler imaging. Profunda Femoral Vein: No evidence of thrombus. Normal compressibility and flow on color Doppler imaging. Femoral Vein: No evidence of thrombus. Normal compressibility, respiratory phasicity and response to augmentation. Popliteal Vein: No evidence of thrombus. Normal compressibility, respiratory phasicity and response to augmentation. Calf Veins: No evidence of thrombus. Normal compressibility and flow on color Doppler imaging. IMPRESSION: No evidence of deep venous thrombosis in either lower extremity. Electronically Signed   By: Jerilynn Mages.  Shick M.D.   On: 10/21/2022 14:44    Assessment and Plan  62 y.o. female with medical history significant for CKD 4 with anemia of CKD, obesity with BMI over 60, OSA, depression, chronic back pain  due to spinal stenosis with neurogenic claudication presented with worsening hip pain and rectal bleeding.  On presentation, hemoglobin was 9.5 down from baseline of 11.5-12 with  creatinine of 2.46, up from baseline of around 1.86 and bicarb of 18. Pelvic x-ray showing chronic degenerative changes of left inferior pubic ramus.  GI was consulted.   Assessment & Plan:   Hematochezia/possible lower GI bleeding--suspected from Hemorrhoidal bleeding (per colonoscopy) Symptomatic anemia Anemia of chronic disease from chronic kidney disease -Presented with intermittent rectal bleeding with hemoglobin of 9.5, down from baseline of 11.6 in October 2023 -hgb stable at 8.9 --Colonoscopy The perianal and digital rectal examinations were normal.      Non-bleeding internal hemorrhoids were found during retroflexion. The       hemorrhoids were large and Grade I (internal hemorrhoids that do not       prolapse).      The exam was otherwise without abnormality on direct and retroflexion  --Anusol supp bid   Acute kidney injury on CKD stage IV Acute metabolic acidosis--resolved -Creatinine 2.46 up from baseline of 1.86 a few months prior with bicarb of 18 on presentation - received IV fluids. -co2 normal and creat going down --BMP in am. Resuming losartan/HCTZ for bp   Chronic back pain Spinal stenosis with neurogenic claudication Ambulatory dysfunction -MRI of lumbar spine showed no acute abnormality within the lumbar spine; showed chronic spinal stenosis. --  PT eval recommends rehab  -Patient interested in SNF placement.  TOC consult -Continue gabapentin and current pain management   Class III severe obesity -Outpatient follow-up   Depression -Continue fluoxetine, hydroxyzine and bupropion   OSA -CPAP if desired    Procedures: colonoscopy Family communication : family at bedside Consults : G.I. CODE STATUS: full DVT Prophylaxis : SCD Level of care: Telemetry Medical Status is: Inpatient Remains inpatient appropriate because: medical workup completed. TOC for discharge planning to rehab. Awaiting bed and insurance     TOTAL TIME TAKING CARE OF THIS  PATIENT: 35 minutes.  >50% time spent on counselling and coordination of care  Note: This dictation was prepared with Dragon dictation along with smaller phrase technology. Any transcriptional errors that result from this process are unintentional.  Fritzi Mandes M.D    [facility]   CC: Primary care physician; Gwyneth Sprout, FNP

## 2022-10-23 NOTE — TOC PASRR Note (Signed)
RE: Ashley Mccullough  Date of Birth: 25-May-1961 Date: 10/23/2022     To Whom It May Concern:   Please be advised that the above-named patient will require a short-term nursing home stay - anticipated 30 days or less for rehabilitation and strengthening.  The plan is for return home

## 2022-10-23 NOTE — NC FL2 (Addendum)
[place] LEVEL OF CARE FORM     IDENTIFICATION  Patient Name: Ashley Mccullough Birthdate: Feb 09, 1961 Sex: female Admission Date (Current Location): 10/20/2022  [facility] and [place] Number:  Engineering geologist and Address:  [facility], [address] [place], [place] [address]      Provider Number: [id number]  Attending Physician Name and Address:  Fritzi Mandes, MD  Relative Name and Phone Number:  Leath,Christine Elenor [place]) [phone]    Current Level of Care: Hospital Recommended Level of Care: [place] Prior Approval Number:    Date Approved/Denied:   PASRR Number: [id number]  Discharge Plan: SNF    Current Diagnoses: Patient Active Problem List   Diagnosis Date Noted   Self-care deficit for feeding, bathing, and toileting 10/20/2022   Personal history of fall 10/20/2022   Stage 3b chronic kidney disease ([place]) 10/20/2022   Hypertension associated with diabetes ([place]) 10/20/2022   Hyperlipidemia associated with type 2 diabetes mellitus ([place]) 10/20/2022   Acute renal failure superimposed on stage 4 chronic kidney disease ([place]) 10/20/2022   Chronic back pain 10/20/2022   Ambulatory dysfunction 10/20/2022   OSA (obstructive sleep apnea) 10/20/2022   Symptomatic anemia 10/20/2022   Hematochezia 10/20/2022   Panniculitis 06/02/2022   Need for influenza vaccination 06/02/2022   Mixed hyperlipidemia 06/02/2022   Anxiety 06/02/2022   Annual physical exam 10/17/2021   Depression, major, single episode, severe ([place]) 10/17/2021   Anemia of chronic kidney failure, stage 4 (severe) ([place]) 10/17/2021   CKD (chronic kidney disease) stage 4, GFR 15-29 ml/min (HCC) 06/06/2021   Hypersomnia 06/05/2021   Lumbar radiculitis 06/05/2021   Encounter for screening mammogram for malignant neoplasm of breast 06/05/2021   Chronic pain syndrome 04/11/2021   Bilateral primary osteoarthritis of knee 04/11/2021   Lumbar spondylosis  04/11/2021   Sacroiliac joint pain 04/11/2021   Chronic pain of both knees 06/21/2020   Chronic radicular lumbar pain 11/18/2019   Spinal stenosis of lumbar region with neurogenic claudication 11/18/2019   DDD (degenerative disc disease), lumbosacral 11/18/2019   Mild episode of recurrent major depressive disorder ([place]) 10/26/2018   BMI 60.0-69.9, adult ([place]) 07/01/2017   Class 3 severe obesity with body mass index (BMI) of 60.0 to 69.9 in adult ([place]) 12/24/2015   Anxiety, generalized 05/29/2015   D (diarrhea) 05/29/2015   Avitaminosis D 05/29/2015   Prediabetes 05/29/2015   Fatigue 05/29/2015   Depression 03/15/2015   Cannot sleep 11/13/2009   Congenital pes planus 03/13/2008   Hypercholesterolemia without hypertriglyceridemia 09/16/2006    Orientation RESPIRATION BLADDER Height & Weight     Time, Self, Situation, Place  Normal External catheter Weight: (!) 355 lb 13.2 oz (161.4 kg) Height:  5\' 2"  (157.5 cm)  BEHAVIORAL SYMPTOMS/MOOD NEUROLOGICAL BOWEL NUTRITION STATUS      Continent Diet (heart healthy)  AMBULATORY STATUS COMMUNICATION OF NEEDS Skin   Extensive Assist Verbally Normal                       Personal Care Assistance Level of Assistance  Bathing, Feeding, Dressing Bathing Assistance: Maximum assistance Feeding assistance: Limited assistance Dressing Assistance: Maximum assistance     Functional Limitations Info  Sight, Hearing, Speech Sight Info: Adequate Hearing Info: Adequate Speech Info: Adequate    SPECIAL CARE FACTORS FREQUENCY  PT (By licensed PT), OT (By licensed OT)     PT Frequency: 5 times a week OT Frequency: 5 times a week  Contractures Contractures Info: Not present    Additional Factors Info  Code Status, Allergies Code Status Info: FULL Allergies Info: Cashew Nut Oil  Latex  Norvasc (Amlodipine)  Simvastatin  Sulfa Antibiotics  Levofloxacin  Prednisone           Current Medications (10/23/2022):  This is the  current hospital active medication list Current Facility-Administered Medications  Medication Dose Route Frequency Provider Last Rate Last Admin   acetaminophen (TYLENOL) tablet 650 mg  650 mg Oral Q6H PRN Athena Masse, MD   650 mg at 10/21/22 0155   Or   acetaminophen (TYLENOL) suppository 650 mg  650 mg Rectal Q6H PRN Athena Masse, MD       buPROPion (WELLBUTRIN XL) 24 hr tablet 300 mg  300 mg Oral Daily Judd Gaudier V, MD   300 mg at 10/23/22 0712   calcitRIOL (ROCALTROL) capsule 0.25 mcg  0.25 mcg Oral Daily Judd Gaudier V, MD   0.25 mcg at 10/23/22 1975   FLUoxetine (PROZAC) capsule 60 mg  60 mg Oral Daily Athena Masse, MD   60 mg at 10/23/22 8832   gabapentin (NEURONTIN) capsule 600 mg  600 mg Oral TID Athena Masse, MD   600 mg at 10/23/22 5498   hydrALAZINE (APRESOLINE) tablet 10 mg  10 mg Oral TID Athena Masse, MD   10 mg at 10/23/22 0824   hydrocortisone (ANUSOL-HC) suppository 25 mg  25 mg Rectal BID Fritzi Mandes, MD   25 mg at 10/22/22 2030   hydrOXYzine (ATARAX) tablet 25 mg  25 mg Oral Q8H PRN Athena Masse, MD       metoprolol succinate (TOPROL-XL) 24 hr tablet 25 mg  25 mg Oral Daily Judd Gaudier V, MD   25 mg at 10/23/22 0823   ondansetron (ZOFRAN) tablet 4 mg  4 mg Oral Q6H PRN Athena Masse, MD       Or   ondansetron [facility]) injection 4 mg  4 mg Intravenous Q6H PRN Athena Masse, MD       oxyCODONE-acetaminophen (PERCOCET/ROXICET) 5-325 MG per tablet 1 tablet  1 tablet Oral Q6H PRN Aline August, MD   1 tablet at 10/22/22 2047     Discharge Medications: Please see discharge summary for a list of discharge medications.  Relevant Imaging Results:  Relevant Lab Results:   Additional Information SS# [id number]  Gerilyn Pilgrim, LCSW

## 2022-10-23 NOTE — TOC Progression Note (Signed)
Transition of Care [facility]) - Progression Note    Patient Details  Name: Ashley Mccullough MRN: [id number] Date of Birth: Feb 07, 1961  Transition of Care [facility]) CM/SW Contact  Gerilyn Pilgrim, LCSW Phone Number: 10/23/2022, 11:53 AM  Clinical Narrative:   SW shared bed offers with sister. Sister would like to wait until [facility] is able to respond about taking her. CSW left voicemail with kitty at [facility].     Expected Discharge Plan: [place] Barriers to Discharge: Continued Medical Work up  Expected Discharge Plan and Services       Living arrangements for the past 2 months: Apartment                                       Social Determinants of Health (SDOH) Interventions SDOH Screenings   Food Insecurity: No Food Insecurity (10/21/2022)  Housing: Low Risk  (10/21/2022)  Transportation Needs: No Transportation Needs (10/21/2022)  Utilities: Unknown (10/21/2022)  Alcohol Screen: Low Risk  (06/02/2022)  Depression (PHQ2-9): High Risk (06/02/2022)  Social Connections: Socially Isolated (03/04/2022)  Stress: No Stress Concern Present (03/04/2022)  Tobacco Use: Low Risk  (10/22/2022)    Readmission Risk Interventions     No data to display

## 2022-10-23 NOTE — TOC Progression Note (Signed)
Transition of Care [facility]) - Progression Note    Patient Details  Name: Ashley Mccullough MRN: [id number] Date of Birth: Oct 08, 1960  Transition of Care [facility]) CM/SW Contact  Gerilyn Pilgrim, LCSW Phone Number: 10/23/2022, 9:24 AM  Clinical Narrative:   Pt triggered level 2 PASSR. Additonal information uploaded to [facility].    Expected Discharge Plan: [place] Barriers to Discharge: Continued Medical Work up  Expected Discharge Plan and Services       Living arrangements for the past 2 months: Apartment                                       Social Determinants of Health (SDOH) Interventions SDOH Screenings   Food Insecurity: No Food Insecurity (10/21/2022)  Housing: Low Risk  (10/21/2022)  Transportation Needs: No Transportation Needs (10/21/2022)  Utilities: Unknown (10/21/2022)  Alcohol Screen: Low Risk  (06/02/2022)  Depression (PHQ2-9): High Risk (06/02/2022)  Social Connections: Socially Isolated (03/04/2022)  Stress: No Stress Concern Present (03/04/2022)  Tobacco Use: Low Risk  (10/22/2022)    Readmission Risk Interventions     No data to display

## 2022-10-23 NOTE — Progress Notes (Signed)
Hb stable. Negative colonoscopy for anything other then hemorrhoids. Nothing further from a GI point of view.  I will sign off.  Please call if any further GI concerns or questions.  We would like to thank you for the opportunity to participate in the care of Ashley Mccullough.

## 2022-10-24 DIAGNOSIS — N184 Chronic kidney disease, stage 4 (severe): Secondary | ICD-10-CM | POA: Diagnosis not present

## 2022-10-24 DIAGNOSIS — N170 Acute kidney failure with tubular necrosis: Secondary | ICD-10-CM

## 2022-10-24 DIAGNOSIS — D649 Anemia, unspecified: Secondary | ICD-10-CM | POA: Diagnosis not present

## 2022-10-24 DIAGNOSIS — K921 Melena: Secondary | ICD-10-CM | POA: Diagnosis not present

## 2022-10-24 DIAGNOSIS — G4733 Obstructive sleep apnea (adult) (pediatric): Secondary | ICD-10-CM

## 2022-10-24 LAB — BASIC METABOLIC PANEL
Anion gap: 4 — ABNORMAL LOW (ref 5–15)
BUN: 23 mg/dL (ref 8–23)
CO2: 22 mmol/L (ref 22–32)
Calcium: 8.7 mg/dL — ABNORMAL LOW (ref 8.9–10.3)
Chloride: 111 mmol/L (ref 98–111)
Creatinine, Ser: 2.18 mg/dL — ABNORMAL HIGH (ref 0.44–1.00)
GFR, Estimated: 25 mL/min — ABNORMAL LOW (ref >60)
Glucose, Bld: 87 mg/dL (ref 70–99)
Potassium: 4.6 mmol/L (ref 3.5–5.1)
Sodium: 137 mmol/L (ref 135–145)

## 2022-10-24 MED ORDER — DICLOFENAC SODIUM 1 % EX GEL
2.0000 g | Freq: Four times a day (QID) | CUTANEOUS | Status: DC
  Administered 2022-10-25 – 2022-10-30 (×19): 2 g via TOPICAL
  Filled 2022-10-24 (×2): qty 100

## 2022-10-24 NOTE — Progress Notes (Signed)
Progress Note   Patient: Ashley Mccullough [id number] DOB: 26-Dec-1960 DOA: 10/20/2022     4 DOS: the patient was seen and examined on 10/24/2022   Brief hospital course:  62 y.o. female with medical history significant for CKD 4 with anemia of CKD, obesity with BMI over 60, OSA, depression, chronic back pain due to spinal stenosis with neurogenic claudication, who presents to the ED with complaints of protracted weakness over the past several weeks, increasing fatigue, shortness of breath with inability to walk without assistance as well as worsening of her chronic back pain radiating down both legs.  Weakness has resulted in a few falls over the past week.  Is becoming increasingly difficult to manage on her own as she lives alone on the third floor of her building and has to walk up the stairs.  She endorses intermittent rectal bleeding.  Last screening colonoscopy was in 2021 and showed internal hemorrhoids.  She denies nausea or vomiting and oral intake has been about the same.  She denies abdominal pain or diarrhea or dysuria.  Denies fever or chills, cough or chest pain. ED course and data review:Vitals unremarkable.  Labs notable for hemoglobin of 9.5, down from baseline of 11.5-12 and creatinine of 2.46 up from baseline around 1.86.  With bicarb of 18 and normal anion gap.  Urinalysis unremarkable.  Pelvic x-ray showing chronic degenerative changes of left inferior pubic ramus.  MRI lumbar spine without contrast ordered and read is pending. Patient treated with oxycodone. Hospitalist consulted for admission for symptomatic anemia, hematochezia and inability to care for self due to ambulatory dysfunction.  Colonoscopy on 2/7 showing nonbleeding internal hemorrhoids  2/9.  Patient complaining of bilateral hip pain and getting around.  She lives on the third floor with no elevator.   Assessment and Plan: * Hematochezia Symptomatic anemia Chronic anemia CKD 4 Last hemoglobin 8.9.   Colonoscopy shows hemorrhoids.  GI signed off.  Acute renal failure superimposed on stage 4 chronic kidney disease (HCC) Metabolic acidosis Creatinine 2.46 up from baseline of 1.86 a few months prior with bicarb of 18 Today's creatinine 2.18  Anemia of chronic kidney failure, stage 4 (severe) (HCC) Last hemoglobin 8.9  Chronic back pain Spinal stenosis with neurogenic claudication Ambulatory dysfunction.  Bilateral hip pain Continue gabapentin MRI lumbar spine---> no acute abnormality Pain control PT recommending rehab. Add diclofenac gel to hips.  Class 3 severe obesity with body mass index (BMI) of 60.0 to 69.9 in adult (HCC) BMI 65.08  Depression Continue fluoxetine, hydroxyzine and bupropion  OSA (obstructive sleep apnea) CPAP if desired        Subjective: Patient complaining of pain when getting around.  She lives on the third floor with no elevator.  Physical Exam: Vitals:   10/23/22 1643 10/23/22 2000 10/24/22 0521 10/24/22 0746  BP: (!) 148/73 (!) 170/83 (!) 144/72 (!) 176/77  Pulse: 73 77 77 84  Resp: 18 18 18 17  $ Temp: 98.5 F (36.9 C) 97.9 F (36.6 C) 98.5 F (36.9 C) 98.2 F (36.8 C)  TempSrc: Oral Oral Oral   SpO2: 98% 97% 96% 95%  Weight:      Height:       Physical Exam HENT:     Head: Normocephalic.     Mouth/Throat:     Pharynx: No oropharyngeal exudate.  Eyes:     General: Lids are normal.     Conjunctiva/sclera: Conjunctivae normal.  Cardiovascular:     Rate and Rhythm: Normal rate and regular  rhythm.     Heart sounds: Normal heart sounds, S1 normal and S2 normal.  Pulmonary:     Breath sounds: No decreased breath sounds, wheezing, rhonchi or rales.  Abdominal:     Palpations: Abdomen is soft.     Tenderness: There is no abdominal tenderness.  Musculoskeletal:     Right lower leg: Swelling present.     Left lower leg: Swelling present.  Skin:    General: Skin is warm.     Findings: No rash.  Neurological:     Mental Status:  She is alert and oriented to person, place, and time.     Data Reviewed: Creatinine 2.18, ferritin 80, hemoglobin 8.9  Disposition: Status is: Inpatient Remains inpatient appropriate because: Awaiting passar and a rehab facility  Planned Discharge Destination: Rehab    Time spent: 28 minutes  Author: Loletha Grayer, MD 10/24/2022 4:02 PM  For on call review [email or web address].

## 2022-10-24 NOTE — Care Management Important Message (Signed)
Important Message  Patient Details  Name: Ashley Mccullough MRN: [id number] Date of Birth: 1960/09/25   Medicare Important Message Given:  Yes     Dannette Barbara 10/24/2022, 11:33 AM

## 2022-10-24 NOTE — Hospital Course (Signed)
62 y.o. female with medical history significant for CKD 4 with anemia of CKD, obesity with BMI over 60, OSA, depression, chronic back pain due to spinal stenosis with neurogenic claudication, who presents to the ED with complaints of protracted weakness over the past several weeks, increasing fatigue, shortness of breath with inability to walk without assistance as well as worsening of her chronic back pain radiating down both legs.  Weakness has resulted in a few falls over the past week.  Is becoming increasingly difficult to manage on her own as she lives alone on the third floor of her building and has to walk up the stairs.  She endorses intermittent rectal bleeding.  Last screening colonoscopy was in 2021 and showed internal hemorrhoids.  She denies nausea or vomiting and oral intake has been about the same.  She denies abdominal pain or diarrhea or dysuria.  Denies fever or chills, cough or chest pain. ED course and data review:Vitals unremarkable.  Labs notable for hemoglobin of 9.5, down from baseline of 11.5-12 and creatinine of 2.46 up from baseline around 1.86.  With bicarb of 18 and normal anion gap.  Urinalysis unremarkable.  Pelvic x-ray showing chronic degenerative changes of left inferior pubic ramus.  MRI lumbar spine without contrast ordered and read is pending. Patient treated with oxycodone. Hospitalist consulted for admission for symptomatic anemia, hematochezia and inability to care for self due to ambulatory dysfunction.  Colonoscopy on 2/7 showing nonbleeding internal hemorrhoids  2/9.  Patient complaining of bilateral hip pain and getting around.  She lives on the third floor with no elevator. 2/10.  Patient still with some hip pain.  She did not use the diclofenac gel yesterday.  Received IV Venofer. 2/11.  Patient willing to try steroids to see if this helps out with her pains.

## 2022-10-24 NOTE — Assessment & Plan Note (Signed)
Last hemoglobin 8.9

## 2022-10-25 DIAGNOSIS — D508 Other iron deficiency anemias: Secondary | ICD-10-CM

## 2022-10-25 DIAGNOSIS — M5442 Lumbago with sciatica, left side: Secondary | ICD-10-CM

## 2022-10-25 DIAGNOSIS — K921 Melena: Secondary | ICD-10-CM | POA: Diagnosis not present

## 2022-10-25 DIAGNOSIS — F32A Depression, unspecified: Secondary | ICD-10-CM

## 2022-10-25 DIAGNOSIS — D649 Anemia, unspecified: Secondary | ICD-10-CM | POA: Diagnosis not present

## 2022-10-25 DIAGNOSIS — M5441 Lumbago with sciatica, right side: Secondary | ICD-10-CM

## 2022-10-25 DIAGNOSIS — D509 Iron deficiency anemia, unspecified: Secondary | ICD-10-CM

## 2022-10-25 LAB — CREATININE, SERUM
Creatinine, Ser: 2.28 mg/dL — ABNORMAL HIGH (ref 0.44–1.00)
GFR, Estimated: 24 mL/min — ABNORMAL LOW (ref >60)

## 2022-10-25 LAB — HEMOGLOBIN: Hemoglobin: 8.6 g/dL — ABNORMAL LOW (ref 12.0–15.0)

## 2022-10-25 MED ORDER — SODIUM CHLORIDE 0.9 % IV SOLN
300.0000 mg | Freq: Once | INTRAVENOUS | Status: AC
  Administered 2022-10-25: 300 mg via INTRAVENOUS
  Filled 2022-10-25: qty 15

## 2022-10-25 MED ORDER — GABAPENTIN 300 MG PO CAPS
300.0000 mg | ORAL_CAPSULE | Freq: Three times a day (TID) | ORAL | Status: DC
  Administered 2022-10-25 – 2022-10-30 (×15): 300 mg via ORAL
  Filled 2022-10-25 (×15): qty 1

## 2022-10-25 NOTE — Assessment & Plan Note (Signed)
With ferritin being 88 will give IV iron today.  Last hemoglobin 8.6.

## 2022-10-25 NOTE — Progress Notes (Signed)
Progress Note   Patient: Ashley Mccullough [id number] DOB: 1961/03/01 DOA: 10/20/2022     5 DOS: the patient was seen and examined on 10/25/2022   Brief hospital course:  62 y.o. female with medical history significant for CKD 4 with anemia of CKD, obesity with BMI over 60, OSA, depression, chronic back pain due to spinal stenosis with neurogenic claudication, who presents to the ED with complaints of protracted weakness over the past several weeks, increasing fatigue, shortness of breath with inability to walk without assistance as well as worsening of her chronic back pain radiating down both legs.  Weakness has resulted in a few falls over the past week.  Is becoming increasingly difficult to manage on her own as she lives alone on the third floor of her building and has to walk up the stairs.  She endorses intermittent rectal bleeding.  Last screening colonoscopy was in 2021 and showed internal hemorrhoids.  She denies nausea or vomiting and oral intake has been about the same.  She denies abdominal pain or diarrhea or dysuria.  Denies fever or chills, cough or chest pain. ED course and data review:Vitals unremarkable.  Labs notable for hemoglobin of 9.5, down from baseline of 11.5-12 and creatinine of 2.46 up from baseline around 1.86.  With bicarb of 18 and normal anion gap.  Urinalysis unremarkable.  Pelvic x-ray showing chronic degenerative changes of left inferior pubic ramus.  MRI lumbar spine without contrast ordered and read is pending. Patient treated with oxycodone. Hospitalist consulted for admission for symptomatic anemia, hematochezia and inability to care for self due to ambulatory dysfunction.  Colonoscopy on 2/7 showing nonbleeding internal hemorrhoids  2/9.  Patient complaining of bilateral hip pain and getting around.  She lives on the third floor with no elevator. 2/10.  Patient still with some hip pain.  She did not use the diclofenac gel yesterday.  Family was okay giving  IV iron today.   Assessment and Plan: * Hematochezia Symptomatic anemia Last hemoglobin 8.6.  Colonoscopy shows hemorrhoids.  GI signed off.  Will give IV iron here with ferritin being 80.  Iron deficiency anemia With ferritin being 88 will give IV iron today.  Last hemoglobin 8.6.  Acute renal failure superimposed on stage 4 chronic kidney disease (HCC) Metabolic acidosis Creatinine 2.46 up from baseline of 1.86 a few months prior with bicarb of 18 Today's creatinine 2.28 and bicarb of 22  Chronic back pain Spinal stenosis with neurogenic claudication Ambulatory dysfunction.  Bilateral hip pain Continue gabapentin MRI lumbar spine---> no acute abnormality Pain control PT recommending rehab. Add diclofenac gel to hips.  Class 3 severe obesity with body mass index (BMI) of 60.0 to 69.9 in adult (HCC) BMI 65.08  Depression Continue fluoxetine, hydroxyzine and bupropion  OSA (obstructive sleep apnea) CPAP if desired        Subjective: Patient complains of back pain and hip pain.  Awaiting passr number for rehab.  Physical Exam: Vitals:   10/24/22 2113 10/25/22 0605 10/25/22 0848 10/25/22 1253  BP: (!) 157/66 126/65 (!) 145/73 (!) 150/80  Pulse: 80 72 79 77  Resp: 17 18 16 18  $ Temp: 98.4 F (36.9 C) 98 F (36.7 C) 97.7 F (36.5 C) 97.8 F (36.6 C)  TempSrc: Oral   Oral  SpO2: 97% 95% 96% 98%  Weight:      Height:       Physical Exam HENT:     Head: Normocephalic.     Mouth/Throat:  Pharynx: No oropharyngeal exudate.  Eyes:     General: Lids are normal.     Conjunctiva/sclera: Conjunctivae normal.  Cardiovascular:     Rate and Rhythm: Normal rate and regular rhythm.     Heart sounds: Normal heart sounds, S1 normal and S2 normal.  Pulmonary:     Breath sounds: No decreased breath sounds, wheezing, rhonchi or rales.  Abdominal:     Palpations: Abdomen is soft.     Tenderness: There is no abdominal tenderness.  Musculoskeletal:     Right lower leg:  Swelling present.     Left lower leg: Swelling present.  Skin:    General: Skin is warm.     Findings: No rash.  Neurological:     Mental Status: She is alert and oriented to person, place, and time.     Data Reviewed: Hemoglobin 8.6, creatinine 2.28, last ferritin 80.  Family Communication: Spoke with patient's aunt and sister on the phone.  Disposition: Status is: Inpatient Remains inpatient appropriate because: Awaiting passr for rehab.  Planned Discharge Destination: Rehab    Time spent: 28 minutes  Author: Loletha Grayer, MD 10/25/2022 3:54 PM  For on call review [email or web address].

## 2022-10-25 NOTE — Plan of Care (Signed)

## 2022-10-26 DIAGNOSIS — K921 Melena: Secondary | ICD-10-CM | POA: Diagnosis not present

## 2022-10-26 DIAGNOSIS — D508 Other iron deficiency anemias: Secondary | ICD-10-CM | POA: Diagnosis not present

## 2022-10-26 DIAGNOSIS — D649 Anemia, unspecified: Secondary | ICD-10-CM | POA: Diagnosis not present

## 2022-10-26 DIAGNOSIS — N170 Acute kidney failure with tubular necrosis: Secondary | ICD-10-CM | POA: Diagnosis not present

## 2022-10-26 MED ORDER — PREDNISONE 20 MG PO TABS
20.0000 mg | ORAL_TABLET | Freq: Every day | ORAL | Status: DC
  Administered 2022-10-27: 20 mg via ORAL
  Filled 2022-10-26: qty 1

## 2022-10-26 MED ORDER — METHYLPREDNISOLONE SODIUM SUCC 40 MG IJ SOLR
40.0000 mg | Freq: Once | INTRAMUSCULAR | Status: AC
  Administered 2022-10-26: 40 mg via INTRAVENOUS
  Filled 2022-10-26: qty 1

## 2022-10-26 NOTE — Progress Notes (Signed)
Mobility Specialist - Progress Note     10/26/22 1625  Mobility  Activity Stood at bedside;Ambulated with assistance in room;Transferred to/from [facility];Transferred from chair to bed  Level of Assistance Contact guard assist, steadying assist  Assistive Device Front wheel walker  Distance Ambulated (ft) 3 ft  Activity Response Tolerated well  Mobility Referral Yes  $Mobility charge 1 Mobility      [employer] Mobility Specialist 10/26/22, 4:27 PM

## 2022-10-26 NOTE — TOC Progression Note (Addendum)
Transition of Care [facility]) - Progression Note    Patient Details  Name: Ashley Mccullough MRN: [id number] Date of Birth: 30-Aug-1961  Transition of Care [facility]) CM/SW [place], LCSW Phone Number: 10/26/2022, 8:37 AM  Clinical Narrative: PASARR obtained: [id number] E. Expires 3/9.    10:45 am: Left messages for [facility] and [facility] admissions coordinators asking them to review referral.  10:50 am: [facility] is unable to offer a bed.  Expected Discharge Plan: [place] Barriers to Discharge: Continued Medical Work up  Expected Discharge Plan and Services       Living arrangements for the past 2 months: Apartment                                       Social Determinants of Health (SDOH) Interventions SDOH Screenings   Food Insecurity: No Food Insecurity (10/21/2022)  Housing: Low Risk  (10/21/2022)  Transportation Needs: No Transportation Needs (10/21/2022)  Utilities: Unknown (10/21/2022)  Alcohol Screen: Low Risk  (06/02/2022)  Depression (PHQ2-9): High Risk (06/02/2022)  Social Connections: Socially Isolated (03/04/2022)  Stress: No Stress Concern Present (03/04/2022)  Tobacco Use: Low Risk  (10/23/2022)    Readmission Risk Interventions     No data to display

## 2022-10-26 NOTE — Plan of Care (Signed)

## 2022-10-26 NOTE — Progress Notes (Signed)
Mobility Specialist - Progress Note    10/26/22 1600  Mobility  Activity Ambulated with assistance in hallway;Stood at bedside;Dangled on edge of bed  Level of Assistance Contact guard assist, steadying assist  Assistive Device Front wheel walker  Distance Ambulated (ft) 12 ft  Activity Response Tolerated well  Mobility Referral Yes  $Mobility charge 1 Mobility   Pt resting in bed on RA upon entry. Pt STS and ambulates to door CGA with RW. Pt very motivated to participate in mobility. Pt returned to bed and left with needs in reach.   Loma Sender Mobility Specialist 10/26/22, 4:25 PM

## 2022-10-26 NOTE — Progress Notes (Signed)
Progress Note   Patient: Ashley Mccullough [id number] DOB: 08-Jun-1961 DOA: 10/20/2022     6 DOS: the patient was seen and examined on 10/26/2022   Brief hospital course:  62 y.o. female with medical history significant for CKD 4 with anemia of CKD, obesity with BMI over 60, OSA, depression, chronic back pain due to spinal stenosis with neurogenic claudication, who presents to the ED with complaints of protracted weakness over the past several weeks, increasing fatigue, shortness of breath with inability to walk without assistance as well as worsening of her chronic back pain radiating down both legs.  Weakness has resulted in a few falls over the past week.  Is becoming increasingly difficult to manage on her own as she lives alone on the third floor of her building and has to walk up the stairs.  She endorses intermittent rectal bleeding.  Last screening colonoscopy was in 2021 and showed internal hemorrhoids.  She denies nausea or vomiting and oral intake has been about the same.  She denies abdominal pain or diarrhea or dysuria.  Denies fever or chills, cough or chest pain. ED course and data review:Vitals unremarkable.  Labs notable for hemoglobin of 9.5, down from baseline of 11.5-12 and creatinine of 2.46 up from baseline around 1.86.  With bicarb of 18 and normal anion gap.  Urinalysis unremarkable.  Pelvic x-ray showing chronic degenerative changes of left inferior pubic ramus.  MRI lumbar spine without contrast ordered and read is pending. Patient treated with oxycodone. Hospitalist consulted for admission for symptomatic anemia, hematochezia and inability to care for self due to ambulatory dysfunction.  Colonoscopy on 2/7 showing nonbleeding internal hemorrhoids  2/9.  Patient complaining of bilateral hip pain and getting around.  She lives on the third floor with no elevator. 2/10.  Patient still with some hip pain.  She did not use the diclofenac gel yesterday.  Received IV  Venofer. 2/11.  Patient willing to try steroids to see if this helps out with her pains.  Assessment and Plan: * Hematochezia Symptomatic anemia Last hemoglobin 8.6.  Colonoscopy shows hemorrhoids.  GI signed off.  IV Venofer given on 2/10.  Iron deficiency anemia With ferritin being 80, patient was given IV Venofer on 2/10.  Last hemoglobin 8.6.  Acute renal failure superimposed on stage 4 chronic kidney disease (HCC) Metabolic acidosis Creatinine 2.46 up from baseline of 1.86 a few months prior with bicarb of 18 Last creatinine 2.28 and bicarb of 22  Chronic back pain Spinal stenosis with neurogenic claudication Ambulatory dysfunction.  Bilateral hip pain Continue gabapentin MRI lumbar spine---> no acute abnormality Pain control PT recommending rehab. Add diclofenac gel to hips. I will try a trial of steroid to see if that helps out with her pains.  Class 3 severe obesity with body mass index (BMI) of 60.0 to 69.9 in adult (HCC) BMI 65.08  Depression Continue fluoxetine, hydroxyzine and bupropion  OSA (obstructive sleep apnea) CPAP if desired        Subjective: Patient does feel little bit better but still having pains especially after moving around.  Came in with hip pain and rectal bleeding  Physical Exam: Vitals:   10/25/22 1736 10/25/22 2051 10/26/22 0525 10/26/22 0805  BP: (!) 144/70 (!) 154/74 131/72 128/83  Pulse: 82 78 76 75  Resp: 20 20 18 20  $ Temp: 98 F (36.7 C) 98.1 F (36.7 C) 98.4 F (36.9 C) 98.5 F (36.9 C)  TempSrc:  Oral Oral   SpO2: 97% 94%  94% 94%  Weight:      Height:       Physical Exam HENT:     Head: Normocephalic.     Mouth/Throat:     Pharynx: No oropharyngeal exudate.  Eyes:     General: Lids are normal.     Conjunctiva/sclera: Conjunctivae normal.  Cardiovascular:     Rate and Rhythm: Normal rate and regular rhythm.     Heart sounds: Normal heart sounds, S1 normal and S2 normal.  Pulmonary:     Breath sounds: No  decreased breath sounds, wheezing, rhonchi or rales.  Abdominal:     Palpations: Abdomen is soft.     Tenderness: There is no abdominal tenderness.  Musculoskeletal:     Right lower leg: Swelling present.     Left lower leg: Swelling present.  Skin:    General: Skin is warm.     Findings: No rash.     Comments: Chronic lower extremity skin discoloration  Neurological:     Mental Status: She is alert and oriented to person, place, and time.     Comments: Able to straight leg raise bilaterally     Data Reviewed: Last creatinine 2.28, last hemoglobin 8.6 Family Communication: Spoke with [facility] on the phone  Disposition: Status is: Inpatient Remains inpatient appropriate because: Awaiting passr for rehab  Planned Discharge Destination: Rehab    Time spent: 28 minutes  Author: Loletha Grayer, MD 10/26/2022 11:50 AM  For on call review [email or web address].

## 2022-10-26 NOTE — Progress Notes (Signed)
Mobility Specialist - Progress Note     10/26/22 0855  Mobility  Activity Ambulated with assistance in room;Stood at bedside;Transferred to/from [facility];Transferred from chair to bed  Level of Assistance Contact guard assist, steadying assist  Assistive Device BSC;Front wheel walker  Distance Ambulated (ft) 3 ft  Activity Response Tolerated well  Mobility Referral Yes  $Mobility charge 1 Mobility   Pt resting in bed on RA upon entry. Pt STS and ambulates (3-4 step pivot transfer) to [facility] CGA. RN notified of constipation. Pt returned to bed and left with needs in reach.    Loma Sender Mobility Specialist 10/26/22, 9:38 AM

## 2022-10-27 DIAGNOSIS — K921 Melena: Secondary | ICD-10-CM | POA: Diagnosis not present

## 2022-10-27 DIAGNOSIS — D649 Anemia, unspecified: Secondary | ICD-10-CM | POA: Diagnosis not present

## 2022-10-27 DIAGNOSIS — N179 Acute kidney failure, unspecified: Secondary | ICD-10-CM | POA: Diagnosis not present

## 2022-10-27 LAB — BASIC METABOLIC PANEL
Anion gap: 6 (ref 5–15)
BUN: 33 mg/dL — ABNORMAL HIGH (ref 8–23)
CO2: 25 mmol/L (ref 22–32)
Calcium: 9 mg/dL (ref 8.9–10.3)
Chloride: 105 mmol/L (ref 98–111)
Creatinine, Ser: 2.1 mg/dL — ABNORMAL HIGH (ref 0.44–1.00)
GFR, Estimated: 26 mL/min — ABNORMAL LOW (ref >60)
Glucose, Bld: 115 mg/dL — ABNORMAL HIGH (ref 70–99)
Potassium: 5.2 mmol/L — ABNORMAL HIGH (ref 3.5–5.1)
Sodium: 136 mmol/L (ref 135–145)

## 2022-10-27 LAB — CBC
HCT: 28.2 % — ABNORMAL LOW (ref 36.0–46.0)
Hemoglobin: 8.9 g/dL — ABNORMAL LOW (ref 12.0–15.0)
MCH: 28.4 pg (ref 26.0–34.0)
MCHC: 31.6 g/dL (ref 30.0–36.0)
MCV: 90.1 fL (ref 80.0–100.0)
Platelets: 291 10*3/uL (ref 150–400)
RBC: 3.13 MIL/uL — ABNORMAL LOW (ref 3.87–5.11)
RDW: 14.8 % (ref 11.5–15.5)
WBC: 11.1 10*3/uL — ABNORMAL HIGH (ref 4.0–10.5)
nRBC: 0.2 % (ref 0.0–0.2)

## 2022-10-27 MED ORDER — SODIUM ZIRCONIUM CYCLOSILICATE 10 G PO PACK
10.0000 g | PACK | Freq: Once | ORAL | Status: AC
  Administered 2022-10-27: 10 g via ORAL
  Filled 2022-10-27: qty 1

## 2022-10-27 MED ORDER — PREDNISONE 20 MG PO TABS
20.0000 mg | ORAL_TABLET | Freq: Every day | ORAL | Status: AC
  Administered 2022-10-28 – 2022-10-30 (×3): 20 mg via ORAL
  Filled 2022-10-27 (×3): qty 1

## 2022-10-27 NOTE — Plan of Care (Signed)

## 2022-10-27 NOTE — TOC Progression Note (Signed)
Transition of Care [facility]) - Progression Note    Patient Details  Name: Ashley Mccullough MRN: [id number] Date of Birth: Jul 25, 1961  [facility]) CM/SW Contact  Gerilyn Pilgrim, LCSW Phone Number: 10/27/2022, 10:05 AM  Clinical Narrative:   CSW spoke with [name] who reports she is agreeable with bed offer at [facility], [facility]'s admission coordinator Caryl Pina would like [place] to start insurance authorization.     Expected Discharge Plan: [place] Barriers to Discharge: Continued Medical Work up  Expected Discharge Plan and Services       Living arrangements for the past 2 months: Apartment                                       Social Determinants of Health (SDOH) Interventions SDOH Screenings   Food Insecurity: No Food Insecurity (10/21/2022)  Housing: Low Risk  (10/21/2022)  Transportation Needs: No Transportation Needs (10/21/2022)  Utilities: Unknown (10/21/2022)  Alcohol Screen: Low Risk  (06/02/2022)  Depression (PHQ2-9): High Risk (06/02/2022)  Social Connections: Socially Isolated (03/04/2022)  Stress: No Stress Concern Present (03/04/2022)  Tobacco Use: Low Risk  (10/23/2022)    Readmission Risk Interventions     No data to display

## 2022-10-27 NOTE — Care Management Important Message (Signed)
Important Message  Patient Details  Name: Ashley Mccullough MRN: [id number] Date of Birth: 07/22/1961   Medicare Important Message Given:  Yes     Dannette Barbara 10/27/2022, 11:36 AM

## 2022-10-27 NOTE — Progress Notes (Signed)
Physical Therapy Treatment Patient Details Name: Ashley Mccullough MRN: [id number] DOB: Jan 21, 1961 Today's Date: 10/27/2022   History of Present Illness 62 y.o. female with medical history significant for CKD 4 with anemia of CKD, obesity with BMI over 60, OSA, depression, chronic back pain due to spinal stenosis with neurogenic claudication presented with worsening hip pain and rectal bleeding.  On presentation, hemoglobin was 9.5    PT Comments    Pt is making gradual progress towards goals with improved ambulation distance this session. Still requires assist for sequencing and demonstrates safety concerns during turns and obstacle avoidance. Likes to ambulate in shoes, however needs set up for donning. Able to ambulate in hallway and in room to [facility]. Will continue to progress as able.   Recommendations for follow up therapy are one component of a multi-disciplinary discharge planning process, led by the attending physician.  Recommendations may be updated based on patient status, additional functional criteria and insurance authorization.  Follow Up Recommendations  Skilled nursing-short term rehab (<3 hours/day) Can patient physically be transported by private vehicle: Yes   Assistance Recommended at Discharge Intermittent Supervision/Assistance  Patient can return home with the following A little help with walking and/or transfers;A little help with bathing/dressing/bathroom;Assist for transportation;Help with stairs or ramp for entrance   Equipment Recommendations  None recommended by PT    Recommendations for Other Services       Precautions / Restrictions Precautions Precautions: Fall Restrictions Weight Bearing Restrictions: No     Mobility  Bed Mobility Overal bed mobility: Needs Assistance Bed Mobility: Sit to Supine       Sit to supine: Min guard   General bed mobility comments: received seated at EOB. At end of session, returned supine with cga for positioning.  Has difficulty with bringing B LEs up into bed.    Transfers Overall transfer level: Needs assistance Equipment used: Rolling walker (2 wheels) Transfers: Sit to/from Stand Sit to Stand: Min guard           General transfer comment: able to stand from regular height bed. Does tend to pull up on RW with cues for correct hand placement. Further transfers performed with B hands pushing from seated surface.    Ambulation/Gait Ambulation/Gait assistance: Min guard Gait Distance (Feet): 80 Feet Assistive device: Rolling walker (2 wheels) Gait Pattern/deviations: Step-through pattern       General Gait Details: ambulated in hallway with reciprocal gait pattern with increased sway noted. BRW used and quick fatigue. Decreased safety awareness during turns with cues for improved technique. 1 standing rest break needed.   Stairs             Wheelchair Mobility    Modified Rankin (Stroke Patients Only)       Balance Overall balance assessment: Needs assistance Sitting-balance support: No upper extremity supported Sitting balance-Leahy Scale: Good     Standing balance support: Bilateral upper extremity supported Standing balance-Leahy Scale: Fair                              Cognition Arousal/Alertness: Awake/alert Behavior During Therapy: WFL for tasks assessed/performed Overall Cognitive Status: Within Functional Limits for tasks assessed                                 General Comments: pleasant and agreeable to session.        Exercises Other  Exercises Other Exercises: ambulated to [facility] with cga. Needs set up for hygiene. Likes to prop L LE on walker in order to clean self.    General Comments        Pertinent Vitals/Pain Pain Assessment Pain Assessment: No/denies pain    Home Living                          Prior Function            PT Goals (current goals can now be found in the care plan section) Acute  Rehab PT Goals Patient Stated Goal: lose weight, get stronger PT Goal Formulation: With patient Time For Goal Achievement: 11/03/22 Potential to Achieve Goals: Fair Progress towards PT goals: Progressing toward goals    Frequency    Min 2X/week      PT Plan Current plan remains appropriate    Co-evaluation              AM-PAC PT "6 Clicks" Mobility   Outcome Measure  Help needed turning from your back to your side while in a flat bed without using bedrails?: A Little Help needed moving from lying on your back to sitting on the side of a flat bed without using bedrails?: A Little Help needed moving to and from a bed to a chair (including a wheelchair)?: A Little Help needed standing up from a chair using your arms (e.g., wheelchair or bedside chair)?: A Little Help needed to walk in hospital room?: A Little Help needed climbing 3-5 steps with a railing? : Total 6 Click Score: 16    End of Session   Activity Tolerance: Patient limited by fatigue Patient left: in bed Nurse Communication: Mobility status PT Visit Diagnosis: Muscle weakness (generalized) (M62.81);Pain;Difficulty in walking, not elsewhere classified (R26.2) Pain - Right/Left: Left Pain - part of body: Leg     Time: 0943-1000 PT Time Calculation (min) (ACUTE ONLY): 17 min  Charges:  $Gait Training: 8-22 mins                     Greggory Stallion, PT, DPT, GCS [phone]    Ashley Mccullough 10/27/2022, 10:49 AM

## 2022-10-27 NOTE — Progress Notes (Signed)
[place] [facility] [place] NAME: Ashley Mccullough    MR#:  [id number]  DATE OF BIRTH:  [birth year]  SUBJECTIVE:  Patient sitting up at bedside Family in the room Tolerating PO diet   VITALS:  Blood pressure (!) 152/85, pulse 76, temperature 98.1 F (36.7 C), resp. rate 17, height 5' 2"$  (1.575 m), weight (!) 161.4 kg, SpO2 94 %.  PHYSICAL EXAMINATION:   GENERAL:  62 y.o.-year-old patient with no acute distress. Morbid obese LUNGS: Normal breath sounds bilaterally, no wheezing CARDIOVASCULAR: S1, S2 normal. No murmur   ABDOMEN: Soft, nontender, nondistended. Bowel sounds present.  EXTREMITIES: No  edema b/l.    NEUROLOGIC: nonfocal  patient is alert and awake SKIN: per RN  LABORATORY PANEL:  CBC Recent Labs  Lab 10/27/22 0629  WBC 11.1*  HGB 8.9*  HCT 28.2*  PLT 291     Chemistries  Recent Labs  Lab 10/20/22 2044 10/22/22 0159 10/24/22 0422 10/27/22 0629  NA 135 137   < > 136  K 4.8 4.1   < > 5.2*  CL 109 110   < > 105  CO2 18* 23   < > 25  GLUCOSE 79 105*   < > 115*  BUN 31* 24*   < > 33*  CREATININE 2.46* 2.21*   < > 2.10*  CALCIUM 8.5* 8.7*   < > 9.0  MG  --  2.0  --   --   AST 20  --   --   --   ALT 16  --   --   --   ALKPHOS 43  --   --   --   BILITOT 0.9  --   --   --    < > = values in this interval not displayed.    Cardiac Enzymes No results for input(s): "TROPONINI" in the last 168 hours. RADIOLOGY:  No results found.  Assessment and Plan  62 y.o. female with medical history significant for CKD 4 with anemia of CKD, obesity with BMI over 60, OSA, depression, chronic back pain due to spinal stenosis with neurogenic claudication presented with worsening hip pain and rectal bleeding.  On presentation, hemoglobin was 9.5 down from baseline of 11.5-12 with creatinine of 2.46, up from baseline of around 1.86 and bicarb of 18. Pelvic x-ray showing chronic degenerative changes of left inferior pubic ramus.  GI was  consulted.   Hematochezia/possible lower GI bleeding--suspected from Hemorrhoidal bleeding (per colonoscopy) Symptomatic anemia Anemia of chronic disease from chronic kidney disease -Presented with intermittent rectal bleeding with hemoglobin of 9.5, down from baseline of 11.6 in October 2023 -hgb stable at 8.9 --Colonoscopy The perianal and digital rectal examinations were normal.      Non-bleeding internal hemorrhoids were found during retroflexion. The       hemorrhoids were large and Grade I (internal hemorrhoids that do not       prolapse).      The exam was otherwise without abnormality on direct and retroflexion  --Anusol supp bid --hgb stable 8.9   Acute kidney injury on CKD stage IV Acute metabolic acidosis--resolved -Creatinine 2.46 up from baseline of 1.86 a few months prior with bicarb of 18 on presentation - received IV fluids. -co2 normal and creat going down --BMP in am. Resuming losartan/HCTZ for bp --creat 2.1   Chronic back pain Spinal stenosis with neurogenic claudication Ambulatory dysfunction -MRI of lumbar spine showed no acute abnormality within the lumbar  spine; showed chronic spinal stenosis. -PT eval recommends rehab  -Continue gabapentin and current pain management --trial of steroids for couple days    Class III severe obesity -Outpatient follow-up   Depression -Continue fluoxetine, hydroxyzine and bupropion   OSA -CPAP if desired    Procedures: colonoscopy Family communication : family at bedside Consults : G.I. CODE STATUS: full DVT Prophylaxis : SCD Level of care: med-surg Status is: Inpatient Remains inpatient appropriate because: medical workup completed. TOC for discharge planning to rehab. Awaiting bed and insurance  Pt wants to go to [place] THIS PATIENT: 35 minutes.  >50% time spent on counselling and coordination of care  Note: This dictation was prepared with Dragon dictation along with smaller  phrase technology. Any transcriptional errors that result from this process are unintentional.  Fritzi Mandes M.D    [facility]   CC: Primary care physician; Gwyneth Sprout, FNP

## 2022-10-27 NOTE — TOC Progression Note (Signed)
Transition of Care [facility]) - Progression Note    Patient Details  Name: Ashley Mccullough MRN: [id number] Date of Birth: 17-Nov-1960  Transition of Care [facility]) CM/SW Contact  Gerilyn Pilgrim, LCSW Phone Number: 10/27/2022, 2:27 PM  Clinical Narrative:   Family and patient report they would like to change bed offer to [facility] care. Authorization started.     Expected Discharge Plan: [place] Barriers to Discharge: Continued Medical Work up  Expected Discharge Plan and Services       Living arrangements for the past 2 months: Apartment                                       Social Determinants of Health (SDOH) Interventions SDOH Screenings   Food Insecurity: No Food Insecurity (10/21/2022)  Housing: Low Risk  (10/21/2022)  Transportation Needs: No Transportation Needs (10/21/2022)  Utilities: Unknown (10/21/2022)  Alcohol Screen: Low Risk  (06/02/2022)  Depression (PHQ2-9): High Risk (06/02/2022)  Social Connections: Socially Isolated (03/04/2022)  Stress: No Stress Concern Present (03/04/2022)  Tobacco Use: Low Risk  (10/23/2022)    Readmission Risk Interventions     No data to display

## 2022-10-28 DIAGNOSIS — K921 Melena: Secondary | ICD-10-CM | POA: Diagnosis not present

## 2022-10-28 DIAGNOSIS — D649 Anemia, unspecified: Secondary | ICD-10-CM | POA: Diagnosis not present

## 2022-10-28 DIAGNOSIS — N179 Acute kidney failure, unspecified: Secondary | ICD-10-CM | POA: Diagnosis not present

## 2022-10-28 NOTE — Progress Notes (Signed)
[place] [facility] [place] NAME: Ashley Mccullough    MR#:  [id number]  DATE OF BIRTH:  April 06, 1961  SUBJECTIVE:  Patient sitting up at bedside eating lunch No Family in the room Tolerating PO diet   VITALS:  Blood pressure (!) 159/78, pulse 76, temperature 97.9 F (36.6 C), temperature source Oral, resp. rate 16, height 5' 2"$  (1.575 m), weight (!) 161.4 kg, SpO2 96 %.  PHYSICAL EXAMINATION:   GENERAL:  62 y.o.-year-old patient with no acute distress. Morbid obese LUNGS: Normal breath sounds bilaterally, no wheezing CARDIOVASCULAR: S1, S2 normal. No murmur   ABDOMEN: Soft, nontender, nondistended. Bowel sounds present.  EXTREMITIES: No  edema b/l.    NEUROLOGIC: nonfocal  patient is alert and awake SKIN: per RN  LABORATORY PANEL:  CBC Recent Labs  Lab 10/27/22 0629  WBC 11.1*  HGB 8.9*  HCT 28.2*  PLT 291     Chemistries  Recent Labs  Lab 10/22/22 0159 10/24/22 0422 10/27/22 0629  NA 137   < > 136  K 4.1   < > 5.2*  CL 110   < > 105  CO2 23   < > 25  GLUCOSE 105*   < > 115*  BUN 24*   < > 33*  CREATININE 2.21*   < > 2.10*  CALCIUM 8.7*   < > 9.0  MG 2.0  --   --    < > = values in this interval not displayed.    Cardiac Enzymes No results for input(s): "TROPONINI" in the last 168 hours. RADIOLOGY:  No results found.  Assessment and Plan  62 y.o. female with medical history significant for CKD 4 with anemia of CKD, obesity with BMI over 60, OSA, depression, chronic back pain due to spinal stenosis with neurogenic claudication presented with worsening hip pain and rectal bleeding.  On presentation, hemoglobin was 9.5 down from baseline of 11.5-12 with creatinine of 2.46, up from baseline of around 1.86 and bicarb of 18. Pelvic x-ray showing chronic degenerative changes of left inferior pubic ramus.  GI was consulted.   Hematochezia/possible lower GI bleeding--suspected from Hemorrhoidal bleeding (per  colonoscopy) Symptomatic anemia Anemia of chronic disease from chronic kidney disease -Presented with intermittent rectal bleeding with hemoglobin of 9.5, down from baseline of 11.6 in October 2023 -hgb stable at 8.9 --Colonoscopy The perianal and digital rectal examinations were normal.      Non-bleeding internal hemorrhoids were found during retroflexion. The       hemorrhoids were large and Grade I (internal hemorrhoids that do not       prolapse).      The exam was otherwise without abnormality on direct and retroflexion  --Anusol supp bid --hgb stable 8.9   Acute kidney injury on CKD stage IV Acute metabolic acidosis--resolved -Creatinine 2.46 up from baseline of 1.86 a few months prior with bicarb of 18 on presentation - received IV fluids. -co2 normal and creat going down --BMP in am. Resuming losartan/HCTZ for bp --creat 2.1   Chronic back pain Spinal stenosis with neurogenic claudication Ambulatory dysfunction -MRI of lumbar spine showed no acute abnormality within the lumbar spine; showed chronic spinal stenosis. -PT eval recommends rehab  -Continue gabapentin and current pain management --trial of steroids for couple days    Class III severe obesity -Outpatient follow-up   Depression -Continue fluoxetine, hydroxyzine and bupropion   OSA -CPAP if desired    Procedures: colonoscopy Family communication : fnone today  Consults : G.I. CODE STATUS: full DVT Prophylaxis : SCD Level of care: med-surg Status is: Inpatient Remains inpatient appropriate because: medical workup completed. TOC for discharge planning to rehab. Awaiting bed and insurance  Pt wants to go to [place] THIS PATIENT: 35 minutes.  >50% time spent on counselling and coordination of care  Note: This dictation was prepared with Dragon dictation along with smaller phrase technology. Any transcriptional errors that result from this process are unintentional.  Fritzi Mandes M.D    [facility]   CC: Primary care physician; Gwyneth Sprout, FNP

## 2022-10-28 NOTE — Progress Notes (Signed)
Mobility Specialist - Progress Note     10/28/22 1037  Mobility  Activity Ambulated independently in hallway  Level of Assistance Standby assist, set-up cues, supervision of patient - no hands on  Distance Ambulated (ft) 50 ft  Range of Motion/Exercises Active  Activity Response Tolerated well  Mobility Referral Yes  $Mobility charge 1 Mobility   Pt resting EOB upon entry on RA. Pt STS and ambulates to hallway around NS with AD. Pt walked 70f to water fountain with steady gait. Pt endorses no pain. Pt returned to bed and left EOB with needs in reach.   ALoma SenderMobility Specialist 10/28/22, 10:42 AM

## 2022-10-28 NOTE — Progress Notes (Signed)
Physical Therapy Treatment Patient Details Name: Ashley Mccullough MRN: [id number] DOB: [birth year] Today's Date: 10/28/2022   History of Present Illness 62 y.o. female with medical history significant for CKD 4 with anemia of CKD, obesity with BMI over 60, OSA, depression, chronic back pain due to spinal stenosis with neurogenic claudication presented with worsening hip pain and rectal bleeding.  On presentation, hemoglobin was 9.5    PT Comments    Pt is making gradual progress towards goals. Reports she just finished ambulation and politely declines further ambulation at this time. Seated there-ex performed for strengthening to B LE/B UE. Will continue to progress as able.   Recommendations for follow up therapy are one component of a multi-disciplinary discharge planning process, led by the attending physician.  Recommendations may be updated based on patient status, additional functional criteria and insurance authorization.  Follow Up Recommendations  Skilled nursing-short term rehab (<3 hours/day) Can patient physically be transported by private vehicle: Yes   Assistance Recommended at Discharge Intermittent Supervision/Assistance  Patient can return home with the following A little help with walking and/or transfers;A little help with bathing/dressing/bathroom;Assist for transportation;Help with stairs or ramp for entrance   Equipment Recommendations  None recommended by PT    Recommendations for Other Services       Precautions / Restrictions Precautions Precautions: Fall Restrictions Weight Bearing Restrictions: No     Mobility  Bed Mobility               General bed mobility comments: not performed, received on EOB pre/post session    Transfers                   General transfer comment: declined as she just finished ambulation to bathroom    Ambulation/Gait                   Stairs             Wheelchair Mobility    Modified  Rankin (Stroke Patients Only)       Balance Overall balance assessment: Needs assistance Sitting-balance support: No upper extremity supported Sitting balance-Leahy Scale: Good                                      Cognition Arousal/Alertness: Awake/alert Behavior During Therapy: WFL for tasks assessed/performed Overall Cognitive Status: Within Functional Limits for tasks assessed                                 General Comments: pleasant and agreeable to session.        Exercises Other Exercises Other Exercises: seated ther-ex performed on B LE and B UE including LAQ, chest press, bicep curls, forward reaches outside of BOS, scap squeezes, and shoulder rolls. 10 reps with cues for sequencing    General Comments        Pertinent Vitals/Pain Pain Assessment Pain Assessment: Faces Faces Pain Scale: Hurts little more Pain Location: L foot from toenail, RN in room to address Pain Descriptors / Indicators: Aching Pain Intervention(s): Limited activity within patient's tolerance    Home Living                          Prior Function            PT Goals (current  goals can now be found in the care plan section) Acute Rehab PT Goals Patient Stated Goal: lose weight, get stronger PT Goal Formulation: With patient Time For Goal Achievement: 11/03/22 Potential to Achieve Goals: Fair Progress towards PT goals: Progressing toward goals    Frequency    Min 2X/week      PT Plan Current plan remains appropriate    Co-evaluation              AM-PAC PT "6 Clicks" Mobility   Outcome Measure  Help needed turning from your back to your side while in a flat bed without using bedrails?: A Little Help needed moving from lying on your back to sitting on the side of a flat bed without using bedrails?: A Little Help needed moving to and from a bed to a chair (including a wheelchair)?: A Little Help needed standing up from a chair  using your arms (e.g., wheelchair or bedside chair)?: A Little Help needed to walk in hospital room?: A Little Help needed climbing 3-5 steps with a railing? : Total 6 Click Score: 16    End of Session   Activity Tolerance: Patient limited by fatigue Patient left: in bed Nurse Communication: Mobility status PT Visit Diagnosis: Muscle weakness (generalized) (M62.81);Pain;Difficulty in walking, not elsewhere classified (R26.2) Pain - Right/Left: Left Pain - part of body: Leg     Time: 1550-1600 PT Time Calculation (min) (ACUTE ONLY): 10 min  Charges:  $Therapeutic Exercise: 8-22 mins                     Greggory Stallion, PT, DPT, GCS [phone]    Ashley Mccullough 10/28/2022, 4:13 PM

## 2022-10-28 NOTE — TOC Progression Note (Signed)
Transition of Care [facility]) - Progression Note    Patient Details  Name: Ashley Mccullough MRN: [id number] Date of Birth: 01/17/1961  Transition of [facility]) CM/SW Contact  Gerilyn Pilgrim, LCSW Phone Number: 10/28/2022, 3:59 PM  Clinical Narrative:   [facility] has approved EMS transport [place] number [phone] but state they would like to do a P2P for SNF. P2P due 2/14 by 12 noon. MD given number to call [phone].     Expected Discharge Plan: [place] Barriers to Discharge: Continued Medical Work up  Expected Discharge Plan and Services       Living arrangements for the past 2 months: Apartment                                       Social Determinants of Health (SDOH) Interventions SDOH Screenings   Food Insecurity: No Food Insecurity (10/21/2022)  Housing: Low Risk  (10/21/2022)  Transportation Needs: No Transportation Needs (10/21/2022)  Utilities: Unknown (10/21/2022)  Alcohol Screen: Low Risk  (06/02/2022)  Depression (PHQ2-9): High Risk (06/02/2022)  Social Connections: Socially Isolated (03/04/2022)  Stress: No Stress Concern Present (03/04/2022)  Tobacco Use: Low Risk  (10/23/2022)    Readmission Risk Interventions     No data to display

## 2022-10-29 DIAGNOSIS — K921 Melena: Secondary | ICD-10-CM | POA: Diagnosis not present

## 2022-10-29 LAB — BASIC METABOLIC PANEL
Anion gap: 14 (ref 5–15)
BUN: 45 mg/dL — ABNORMAL HIGH (ref 8–23)
CO2: 23 mmol/L (ref 22–32)
Calcium: 9.8 mg/dL (ref 8.9–10.3)
Chloride: 100 mmol/L (ref 98–111)
Creatinine, Ser: 2.47 mg/dL — ABNORMAL HIGH (ref 0.44–1.00)
GFR, Estimated: 22 mL/min — ABNORMAL LOW (ref >60)
Glucose, Bld: 130 mg/dL — ABNORMAL HIGH (ref 70–99)
Potassium: 5.2 mmol/L — ABNORMAL HIGH (ref 3.5–5.1)
Sodium: 137 mmol/L (ref 135–145)

## 2022-10-29 LAB — CBC
HCT: 34.9 % — ABNORMAL LOW (ref 36.0–46.0)
Hemoglobin: 10.7 g/dL — ABNORMAL LOW (ref 12.0–15.0)
MCH: 28.3 pg (ref 26.0–34.0)
MCHC: 30.7 g/dL (ref 30.0–36.0)
MCV: 92.3 fL (ref 80.0–100.0)
Platelets: 387 10*3/uL (ref 150–400)
RBC: 3.78 MIL/uL — ABNORMAL LOW (ref 3.87–5.11)
RDW: 14.7 % (ref 11.5–15.5)
WBC: 12.1 10*3/uL — ABNORMAL HIGH (ref 4.0–10.5)
nRBC: 0 % (ref 0.0–0.2)

## 2022-10-29 LAB — MAGNESIUM: Magnesium: 2.1 mg/dL (ref 1.7–2.4)

## 2022-10-29 MED ORDER — SODIUM ZIRCONIUM CYCLOSILICATE 10 G PO PACK
10.0000 g | PACK | Freq: Two times a day (BID) | ORAL | Status: DC
  Administered 2022-10-29 – 2022-10-30 (×2): 10 g via ORAL
  Filled 2022-10-29 (×2): qty 1

## 2022-10-29 MED ORDER — HYDRALAZINE HCL 50 MG PO TABS
50.0000 mg | ORAL_TABLET | Freq: Three times a day (TID) | ORAL | Status: DC
  Administered 2022-10-29 – 2022-10-30 (×2): 50 mg via ORAL
  Filled 2022-10-29 (×2): qty 1

## 2022-10-29 NOTE — TOC Progression Note (Addendum)
Transition of Care [facility]) - Progression Note    Patient Details  Name: Natallia Kapner MRN: [id number] Date of Birth: 05-22-1961  Transition of Care [facility]) CM/SW Contact  Gerilyn Pilgrim, LCSW Phone Number: 10/29/2022, 11:26 AM  Clinical Narrative:   CSW spoke with sister [name] about plans for after discharge. [name] reports that she has a plan for her to go to a senior living apartment in [place]. [name] states if the are unable to get that coordinated when she gets out of rehab that she will be able to take the patient to her home her in [place].   2/14 Auth approved for 7 days auth ID [id number] and EMS ID [id number].   Expected Discharge Plan: [place] Barriers to Discharge: Continued Medical Work up  Expected Discharge Plan and Services       Living arrangements for the past 2 months: Apartment                                       Social Determinants of Health (SDOH) Interventions SDOH Screenings   Food Insecurity: No Food Insecurity (10/21/2022)  Housing: Low Risk  (10/21/2022)  Transportation Needs: No Transportation Needs (10/21/2022)  Utilities: Unknown (10/21/2022)  Alcohol Screen: Low Risk  (06/02/2022)  Depression (PHQ2-9): High Risk (06/02/2022)  Social Connections: Socially Isolated (03/04/2022)  Stress: No Stress Concern Present (03/04/2022)  Tobacco Use: Low Risk  (10/23/2022)    Readmission Risk Interventions     No data to display

## 2022-10-29 NOTE — Progress Notes (Signed)
  PROGRESS NOTE    Ashley Mccullough  [id number] DOB: 1961/06/27 DOA: 10/20/2022 PCP: Gwyneth Sprout, FNP  105A/105A-AA  LOS: 9 days   Brief hospital course:   Assessment & Plan: 62 y.o. female with medical history significant for CKD 4 with anemia of CKD, obesity with BMI over 60, OSA, depression, chronic back pain due to spinal stenosis with neurogenic claudication presented with worsening hip pain and rectal bleeding.  On presentation, hemoglobin was 9.5 down from baseline of 11.5-12 with creatinine of 2.46, up from baseline of around 1.86 and bicarb of 18. Pelvic x-ray showing chronic degenerative changes of left inferior pubic ramus.  GI was consulted.   Hematochezia/possible lower GI bleeding--suspected from Hemorrhoidal bleeding (per colonoscopy) Symptomatic anemia Anemia of chronic disease from chronic kidney disease -Presented with intermittent rectal bleeding with hemoglobin of 9.5, down from baseline of 11.6 in October 2023 --Colonoscopy showed Non-bleeding internal hemorrhoids  --Hgb today 10.7 --Anusol supp bid   Acute kidney injury on CKD stage IV Acute metabolic acidosis--resolved -Creatinine 2.46 up from baseline of 1.86 in Sep 2023 - received IV fluids, however, Cr ranged between 2.1 to 2.4, which is likely now baseline   Chronic back pain Spinal stenosis with neurogenic claudication Ambulatory dysfunction -MRI of lumbar spine showed no acute abnormality within the lumbar spine; showed chronic spinal stenosis. -PT eval recommends rehab  -Continue gabapentin  --tramadol PRN   Class III severe obesity -Outpatient follow-up   Depression -Continue fluoxetine, hydroxyzine and bupropion   OSA -CPAP if desired  HTN --cont losartan, HCTZ and Toprol --increase hydralazine to 50 mg TID  Hyperkalemia --start Lokelma    DVT prophylaxis: SCD/Compression stockings Code Status: Full code  Family Communication:  Level of care: Telemetry Medical Dispo:    The patient is from: home Anticipated d/c is to: SNF rehab Anticipated d/c date is: tomorrow   Subjective and Interval History:  No new complaints today.    Dr. Fritzi Mandes did peer-to-peer, and pt was approved for 7 days of rehab.   Objective: Vitals:   10/28/22 1550 10/28/22 2005 10/29/22 0432 10/29/22 0746  BP: (!) 174/89 (!) 162/77 (!) 162/78 (!) 158/76  Pulse: 77 74 67 74  Resp:  18 18 17   Temp: 98.2 F (36.8 C) 97.8 F (36.6 C) 97.6 F (36.4 C) 97.6 F (36.4 C)  TempSrc:   Oral   SpO2: 94% 97% 95% 95%  Weight:      Height:        Intake/Output Summary (Last 24 hours) at 10/29/2022 1805 Last data filed at 10/29/2022 0400 Gross per 24 hour  Intake 240 ml  Output 300 ml  Net -60 ml   Filed Weights   10/20/22 1641 10/22/22 1312  Weight: (!) 158.8 kg (!) 161.4 kg    Examination:   Constitutional: NAD, AAOx3 HEENT: conjunctivae and lids normal, EOMI CV: No cyanosis.   RESP: normal respiratory effort, on RA Neuro: II - XII grossly intact.   Psych: Normal mood and affect.  Appropriate judgement and reason   Data Reviewed: I have personally reviewed labs and imaging studies  Time spent: 50 minutes  Enzo Bi, MD [facility] If 7PM-7AM, please contact night-coverage 10/29/2022, 6:05 PM

## 2022-10-29 NOTE — Plan of Care (Signed)

## 2022-10-29 NOTE — Progress Notes (Signed)
Mobility Specialist - Progress Note   10/29/22 0848  Mobility  Activity Ambulated with assistance in hallway  Level of Assistance Standby assist, set-up cues, supervision of patient - no hands on  Assistive Device Front wheel walker  Distance Ambulated (ft) 100 ft  Activity Response Tolerated well  Mobility Referral Yes  $Mobility charge 1 Mobility   Pt semi-supine in bed on RA upon arrival. Pt completes bed mobility, STS, and ambulates in hallway SBA with no LOB noted. Pt takes one standing rest break at 80 ft. PT returns to bed with needs in reach and RN in room.   Gretchen Short  Mobility Specialist  10/29/22 8:49 AM

## 2022-10-30 DIAGNOSIS — R262 Difficulty in walking, not elsewhere classified: Secondary | ICD-10-CM | POA: Diagnosis not present

## 2022-10-30 DIAGNOSIS — I152 Hypertension secondary to endocrine disorders: Secondary | ICD-10-CM | POA: Diagnosis not present

## 2022-10-30 DIAGNOSIS — F419 Anxiety disorder, unspecified: Secondary | ICD-10-CM | POA: Diagnosis not present

## 2022-10-30 DIAGNOSIS — K649 Unspecified hemorrhoids: Secondary | ICD-10-CM | POA: Diagnosis not present

## 2022-10-30 DIAGNOSIS — E785 Hyperlipidemia, unspecified: Secondary | ICD-10-CM | POA: Diagnosis not present

## 2022-10-30 DIAGNOSIS — F411 Generalized anxiety disorder: Secondary | ICD-10-CM | POA: Diagnosis not present

## 2022-10-30 DIAGNOSIS — G894 Chronic pain syndrome: Secondary | ICD-10-CM | POA: Diagnosis not present

## 2022-10-30 DIAGNOSIS — Z6841 Body Mass Index (BMI) 40.0 and over, adult: Secondary | ICD-10-CM | POA: Diagnosis not present

## 2022-10-30 DIAGNOSIS — Z760 Encounter for issue of repeat prescription: Secondary | ICD-10-CM | POA: Diagnosis not present

## 2022-10-30 DIAGNOSIS — R29898 Other symptoms and signs involving the musculoskeletal system: Secondary | ICD-10-CM | POA: Diagnosis not present

## 2022-10-30 DIAGNOSIS — R52 Pain, unspecified: Secondary | ICD-10-CM | POA: Diagnosis not present

## 2022-10-30 DIAGNOSIS — R5381 Other malaise: Secondary | ICD-10-CM | POA: Diagnosis not present

## 2022-10-30 DIAGNOSIS — E1159 Type 2 diabetes mellitus with other circulatory complications: Secondary | ICD-10-CM | POA: Diagnosis not present

## 2022-10-30 DIAGNOSIS — R2681 Unsteadiness on feet: Secondary | ICD-10-CM | POA: Diagnosis not present

## 2022-10-30 DIAGNOSIS — K922 Gastrointestinal hemorrhage, unspecified: Secondary | ICD-10-CM | POA: Diagnosis not present

## 2022-10-30 DIAGNOSIS — D631 Anemia in chronic kidney disease: Secondary | ICD-10-CM | POA: Diagnosis not present

## 2022-10-30 DIAGNOSIS — N179 Acute kidney failure, unspecified: Secondary | ICD-10-CM | POA: Diagnosis not present

## 2022-10-30 DIAGNOSIS — K625 Hemorrhage of anus and rectum: Secondary | ICD-10-CM | POA: Diagnosis not present

## 2022-10-30 DIAGNOSIS — M5137 Other intervertebral disc degeneration, lumbosacral region: Secondary | ICD-10-CM | POA: Diagnosis not present

## 2022-10-30 DIAGNOSIS — K921 Melena: Secondary | ICD-10-CM | POA: Diagnosis not present

## 2022-10-30 DIAGNOSIS — M6281 Muscle weakness (generalized): Secondary | ICD-10-CM | POA: Diagnosis not present

## 2022-10-30 DIAGNOSIS — Z7401 Bed confinement status: Secondary | ICD-10-CM | POA: Diagnosis not present

## 2022-10-30 DIAGNOSIS — I10 Essential (primary) hypertension: Secondary | ICD-10-CM | POA: Diagnosis not present

## 2022-10-30 DIAGNOSIS — E1169 Type 2 diabetes mellitus with other specified complication: Secondary | ICD-10-CM | POA: Diagnosis not present

## 2022-10-30 DIAGNOSIS — N184 Chronic kidney disease, stage 4 (severe): Secondary | ICD-10-CM | POA: Diagnosis not present

## 2022-10-30 DIAGNOSIS — G4733 Obstructive sleep apnea (adult) (pediatric): Secondary | ICD-10-CM | POA: Diagnosis not present

## 2022-10-30 DIAGNOSIS — W19XXXA Unspecified fall, initial encounter: Secondary | ICD-10-CM | POA: Diagnosis not present

## 2022-10-30 DIAGNOSIS — M549 Dorsalgia, unspecified: Secondary | ICD-10-CM | POA: Diagnosis not present

## 2022-10-30 DIAGNOSIS — M48062 Spinal stenosis, lumbar region with neurogenic claudication: Secondary | ICD-10-CM | POA: Diagnosis not present

## 2022-10-30 DIAGNOSIS — R531 Weakness: Secondary | ICD-10-CM | POA: Diagnosis not present

## 2022-10-30 DIAGNOSIS — F339 Major depressive disorder, recurrent, unspecified: Secondary | ICD-10-CM | POA: Diagnosis not present

## 2022-10-30 DIAGNOSIS — F5105 Insomnia due to other mental disorder: Secondary | ICD-10-CM | POA: Diagnosis not present

## 2022-10-30 DIAGNOSIS — F331 Major depressive disorder, recurrent, moderate: Secondary | ICD-10-CM | POA: Diagnosis not present

## 2022-10-30 LAB — CBC
HCT: 28.8 % — ABNORMAL LOW (ref 36.0–46.0)
Hemoglobin: 9.1 g/dL — ABNORMAL LOW (ref 12.0–15.0)
MCH: 28.7 pg (ref 26.0–34.0)
MCHC: 31.6 g/dL (ref 30.0–36.0)
MCV: 90.9 fL (ref 80.0–100.0)
Platelets: 335 10*3/uL (ref 150–400)
RBC: 3.17 MIL/uL — ABNORMAL LOW (ref 3.87–5.11)
RDW: 14.4 % (ref 11.5–15.5)
WBC: 10.9 10*3/uL — ABNORMAL HIGH (ref 4.0–10.5)
nRBC: 0 % (ref 0.0–0.2)

## 2022-10-30 LAB — BASIC METABOLIC PANEL
Anion gap: 10 (ref 5–15)
BUN: 47 mg/dL — ABNORMAL HIGH (ref 8–23)
CO2: 23 mmol/L (ref 22–32)
Calcium: 9.1 mg/dL (ref 8.9–10.3)
Chloride: 102 mmol/L (ref 98–111)
Creatinine, Ser: 2.24 mg/dL — ABNORMAL HIGH (ref 0.44–1.00)
GFR, Estimated: 24 mL/min — ABNORMAL LOW (ref >60)
Glucose, Bld: 106 mg/dL — ABNORMAL HIGH (ref 70–99)
Potassium: 4.5 mmol/L (ref 3.5–5.1)
Sodium: 135 mmol/L (ref 135–145)

## 2022-10-30 LAB — MAGNESIUM: Magnesium: 2.1 mg/dL (ref 1.7–2.4)

## 2022-10-30 MED ORDER — POLYETHYLENE GLYCOL 3350 17 G PO PACK: 17.0000 g | PACK | Freq: Two times a day (BID) | ORAL | 0 refills | Status: AC

## 2022-10-30 MED ORDER — MELOXICAM 15 MG PO TABS: 15.0000 mg | ORAL_TABLET | Freq: Every day | ORAL | Status: DC | PRN

## 2022-10-30 MED ORDER — HYDROCORTISONE ACETATE 25 MG RE SUPP: 25.0000 mg | Freq: Two times a day (BID) | RECTAL | 0 refills | Status: AC

## 2022-10-30 MED ORDER — HYDRALAZINE HCL 50 MG PO TABS: 50.0000 mg | ORAL_TABLET | Freq: Three times a day (TID) | ORAL | Status: DC

## 2022-10-30 NOTE — Plan of Care (Signed)
  Problem: Education: Goal: Knowledge of General Education information will improve Description: Including pain rating scale, medication(s)/side effects and non-pharmacologic comfort measures Outcome: Progressing   Problem: Health Behavior/Discharge Planning: Goal: Ability to manage health-related needs will improve Outcome: Progressing   Problem: Clinical Measurements: Goal: Ability to maintain clinical measurements within normal limits will improve Outcome: Progressing   Problem: Activity: Goal: Risk for activity intolerance will decrease Outcome: Progressing   Problem: Nutrition: Goal: Adequate nutrition will be maintained Outcome: Progressing   Problem: Coping: Goal: Level of anxiety will decrease Outcome: Progressing   Problem: Elimination: Goal: Will not experience complications related to bowel motility Outcome: Progressing   Problem: Pain Managment: Goal: General experience of comfort will improve Outcome: Progressing   Problem: Skin Integrity: Goal: Risk for impaired skin integrity will decrease Outcome: Progressing   

## 2022-10-30 NOTE — Progress Notes (Signed)
Mobility Specialist - Progress Note     10/30/22 1046  Mobility  Activity Ambulated with assistance in hallway;Stood at bedside  Level of Assistance Standby assist, set-up cues, supervision of patient - no hands on  Assistive Device Front wheel walker  Distance Ambulated (ft) 40 ft  Range of Motion/Exercises Active  Activity Response Tolerated well  Mobility Referral Yes  $Mobility charge 1 Mobility   Pt resting EOB upon entry on RA. Pt STS and ambulates to hallway to nurse station. Pt endorses pain in knee but still wanting to participate in mobility. Pt returned to EOB and left with needs in reach.   Loma Sender Mobility Specialist 10/30/22, 10:50 AM

## 2022-10-30 NOTE — Discharge Summary (Addendum)
Physician Discharge Summary   Ashley Mccullough  female DOB: 1961-02-19  [id number]  PCP: Gwyneth Sprout, FNP  Admit date: 10/20/2022 Discharge date: 10/30/2022  Admitted From: home Disposition:  SNF rehab CODE STATUS: Full code   Hospital Course:  For full details, please see H&P, progress notes, consult notes and ancillary notes.  Briefly,  Ashley Mccullough is a 62 y.o. female with medical history significant for CKD 4 with anemia of CKD, obesity with BMI over 60, OSA, depression, chronic back pain due to spinal stenosis with neurogenic claudication presented with worsening hip pain and rectal bleeding.     Hematochezia lower GI bleeding--suspected from Hemorrhoidal bleeding (per colonoscopy) Symptomatic anemia Anemia of chronic disease from chronic kidney disease -Presented with intermittent rectal bleeding with hemoglobin of 9.5, down from baseline of 11.6 in October 2023 --Colonoscopy showed Non-bleeding internal hemorrhoids  --Hgb 9.1 prior to discharge. --Anusol supp bid for 10 more days after discharge --scheduled Miralax to prevent constipation.   CKD stage IV AKI, ruled out Acute metabolic acidosis--resolved - Cr 1.86 in Sept 2023, presented with Cr 2.46.  Pt received IV fluids, however, Cr ranged between 2.1 to 2.4, which is likely now baseline. --Discussed with pt and advised her to resume outpatient f/u with nephrology.   Chronic back pain Spinal stenosis with neurogenic claudication Ambulatory dysfunction -Pelvic x-ray showing chronic degenerative changes of left inferior pubic ramus.   --MRI of lumbar spine showed no acute abnormality within the lumbar spine; showed chronic spinal stenosis. -PT eval recommends rehab  -Continue gabapentin  --cont home meloxican as PRN   Morbid obesity, BMI 62 -Outpatient follow-up   Depression -Continue fluoxetine, hydroxyzine and bupropion   OSA --pt doesn't use CPAP at home.   HTN --cont losartan, HCTZ  and Toprol --increased hydralazine from 10 mg to 50 mg TID   Hyperkalemia --resolved with [facility]   Discharge Diagnoses:  Principal Problem:   Hematochezia Active Problems:   Symptomatic anemia   Iron deficiency anemia   Acute renal failure superimposed on stage 4 chronic kidney disease (HCC)   Chronic back pain   Ambulatory dysfunction   Class 3 severe obesity with body mass index (BMI) of 60.0 to 69.9 in adult (HCC)   Depression   OSA (obstructive sleep apnea)   30 Day Unplanned Readmission Risk Score    Flowsheet Row ED to Hosp-Admission (Current) from 10/20/2022 in [place]  30 Day Unplanned Readmission Risk Score (%) 10.37 Filed at 10/30/2022 0801       This score is the patient's risk of an unplanned readmission within 30 days of being discharged (0 -100%). The score is based on dignosis, age, lab data, medications, orders, and past utilization.   Low:  0-14.9   Medium: 15-21.9   High: 22-29.9   Extreme: 30 and above         Discharge Instructions:  Allergies as of 10/30/2022       Reactions   Cashew Nut Oil Hives, Itching, Swelling   Latex Itching   Norvasc [amlodipine] Other (See Comments)   Dizziness, flushed   Simvastatin    Other reaction(s): Unknown Memory changes Memory changes   Sulfa Antibiotics    Other reaction(s): Unknown   Levofloxacin Rash   Prednisone Rash        Medication List     STOP taking these medications    meloxicam 15 MG tablet Commonly known as: MOBIC  TAKE these medications    acetaminophen 650 MG CR tablet Commonly known as: TYLENOL Take 650-1,300 mg by mouth at bedtime. As needed for arthritis.   buPROPion 150 MG 24 hr tablet Commonly known as: WELLBUTRIN XL Take 2 tablets (300 mg total) by mouth daily.   calcitRIOL 0.25 MCG capsule Commonly known as: ROCALTROL TAKE 1 CAPSULE BY MOUTH 1 TIME EACH DAY   Cetirizine HCl 10 MG Caps Take 1 capsule by mouth  daily.   COLLAGEN PO Take by mouth.   FLUoxetine 40 MG capsule Commonly known as: PROzac Take 1 capsule (40 mg total) by mouth daily. Take once daily with 20 mg dose for total of 60 mg.   FLUoxetine 20 MG capsule Commonly known as: PROZAC Take 1 capsule (20 mg total) by mouth daily. Take once daily with 40 mg dose for total of 60 mg.   gabapentin 300 MG capsule Commonly known as: NEURONTIN TAKE TAKE 2 CAPSULES BY MOUTH 3 TIMES A DAY   hydrALAZINE 50 MG tablet Commonly known as: APRESOLINE Take 1 tablet (50 mg total) by mouth 3 (three) times daily. Increased from 10 mg. What changed:  medication strength how much to take additional instructions   hydrocortisone 25 MG suppository Commonly known as: ANUSOL-HC Place 1 suppository (25 mg total) rectally 2 (two) times daily for 10 days.   hydrOXYzine 25 MG capsule Commonly known as: VISTARIL TAKE 1 CAPSULE (25 MG TOTAL) BY MOUTH EVERY 8 (EIGHT) HOURS AS NEEDED.   losartan-hydrochlorothiazide 100-25 MG tablet Commonly known as: HYZAAR Take 1 tablet by mouth daily.   metoprolol succinate 25 MG 24 hr tablet Commonly known as: TOPROL-XL TAKE 1 TABLET BY MOUTH DAILY   montelukast 10 MG tablet Commonly known as: SINGULAIR Take 1 tablet (10 mg total) by mouth daily.   polyethylene glycol 17 g packet Commonly known as: MIRALAX / GLYCOLAX Take 17 g by mouth 2 (two) times daily.   Vitamin D3 125 MCG (5000 UT) Tabs Generic drug: Cholecalciferol Take 5,000 tablets by mouth daily.         Contact information for follow-up providers     Gwyneth Sprout, FNP. Go on 11/04/2022.   Specialty: Family Medicine Why: @ 2:20pm Contact information: [address] [place] [address] [phone]              Contact information for after-discharge care     [facility] .   Service: Skilled Nursing Contact information: [place]  [address] [phone]                     Allergies  Allergen Reactions   Cashew Nut Oil Hives, Itching and Swelling   Latex Itching   Norvasc [Amlodipine] Other (See Comments)    Dizziness, flushed   Simvastatin     Other reaction(s): Unknown Memory changes Memory changes   Sulfa Antibiotics     Other reaction(s): Unknown   Levofloxacin Rash   Prednisone Rash     The results of significant diagnostics from this hospitalization (including imaging, microbiology, ancillary and laboratory) are listed below for reference.   Consultations:   Procedures/Studies: US Venous Img Lower Bilateral (DVT)  Result Date: 10/21/2022 CLINICAL DATA:  Swelling and pain, recent fall EXAM: BILATERAL LOWER EXTREMITY VENOUS DOPPLER ULTRASOUND TECHNIQUE: Gray-scale sonography with graded compression, as well as color Doppler and duplex ultrasound were performed to evaluate the lower extremity deep venous systems from  the level of the common femoral vein and including the common femoral, femoral, profunda femoral, popliteal and calf veins including the posterior tibial, peroneal and gastrocnemius veins when visible. Spectral Doppler was utilized to evaluate flow at rest and with distal augmentation maneuvers in the common femoral, femoral and popliteal veins. COMPARISON:  None Available. FINDINGS: RIGHT LOWER EXTREMITY Common Femoral Vein: No evidence of thrombus. Normal compressibility, respiratory phasicity and response to augmentation. Saphenofemoral Junction: No evidence of thrombus. Normal compressibility and flow on color Doppler imaging. Profunda Femoral Vein: No evidence of thrombus. Normal compressibility and flow on color Doppler imaging. Femoral Vein: No evidence of thrombus. Normal compressibility, respiratory phasicity and response to augmentation. Popliteal Vein: No evidence of thrombus. Normal compressibility, respiratory phasicity and response to augmentation. Calf Veins: No evidence of  thrombus. Normal compressibility and flow on color Doppler imaging. LEFT LOWER EXTREMITY Common Femoral Vein: No evidence of thrombus. Normal compressibility, respiratory phasicity and response to augmentation. Saphenofemoral Junction: No evidence of thrombus. Normal compressibility and flow on color Doppler imaging. Profunda Femoral Vein: No evidence of thrombus. Normal compressibility and flow on color Doppler imaging. Femoral Vein: No evidence of thrombus. Normal compressibility, respiratory phasicity and response to augmentation. Popliteal Vein: No evidence of thrombus. Normal compressibility, respiratory phasicity and response to augmentation. Calf Veins: No evidence of thrombus. Normal compressibility and flow on color Doppler imaging. IMPRESSION: No evidence of deep venous thrombosis in either lower extremity. Electronically Signed   By: Jerilynn Mages.  Shick M.D.   On: 10/21/2022 14:44   MR LUMBAR SPINE WO CONTRAST  Result Date: 10/21/2022 CLINICAL DATA:  Initial evaluation for low back pain. EXAM: MRI LUMBAR SPINE WITHOUT CONTRAST TECHNIQUE: Multiplanar, multisequence MR imaging of the lumbar spine was performed. No intravenous contrast was administered. COMPARISON:  Prior MRI from 04/25/2019. FINDINGS: Segmentation: Standard. Lowest well-formed disc space labeled the L5-S1 level. Alignment: 4 mm facet mediated anterolisthesis of L4 on L5, stable. Alignment otherwise normal preservation of the normal lumbar lordosis. Vertebrae: Vertebral body height maintained without acute or chronic fracture. Bone marrow signal intensity within normal limits. No worrisome osseous lesions. Discogenic reactive endplate change with mild marrow edema present about the T10-11, L4-5, and L5-S1 interspaces. No other abnormal marrow edema. Conus medullaris and cauda equina: Conus extends to the T12 level. Conus and cauda equina appear normal. Paraspinal and other soft tissues: Unremarkable. Disc levels: T10-11: Seen only on sagittal  projection. Disc desiccation with diffuse disc bulge. Reactive endplate spurring. No significant spinal stenosis. Mild to moderate bilateral foraminal narrowing. T11-12: Unremarkable. T12-L1: Unremarkable. L1-2:  Unremarkable. L2-3: Minimal left eccentric disc bulge. No spinal stenosis. Foramina remain patent. L3-4: Mild annular disc bulging. Mild to moderate facet hypertrophy. Associated prominent joint effusions. Borderline mild narrowing of the lateral recesses bilaterally. Central canal remains patent. Mild bilateral L3 foraminal narrowing. L4-5: 4 mm anterolisthesis. Disc desiccation with broad posterior pseudo disc bulge/uncovering, asymmetric to the right. Associated slight superior migration. Severe bilateral facet arthrosis. Prominent joint effusion on the right. Resultant mild bilateral subarticular stenosis. Central canal remains patent. Moderate right worse than left L4 foraminal stenosis. L5-S1: Degenerative intervertebral disc space narrowing with diffuse disc bulge and disc desiccation. Reactive endplate change with marginal endplate osteophytic spurring. Superimposed shallow right subarticular disc protrusion with annular fissure mildly indents the right ventral thecal sac (series 12, image 32). Mild facet hypertrophy. No significant spinal stenosis. Moderate bilateral L5 foraminal narrowing. IMPRESSION: 1. No acute abnormality within the lumbar spine. 2. 4 mm facet mediated anterolisthesis of L4 on  L5 with resultant mild bilateral subarticular stenosis, with moderate right worse than left L4 foraminal narrowing. 3. Moderate bilateral L5 foraminal stenosis related to disc bulge, endplate change, and facet hypertrophy. 4. Mild to moderate bilateral L3 foraminal stenosis related to disc bulge and facet hypertrophy. Electronically Signed   By: Jeannine Boga M.D.   On: 10/21/2022 00:37   DG Pelvis 1-2 Views  Result Date: 10/20/2022 CLINICAL DATA:  Status post fall 1 month ago. EXAM: PELVIS -  1-2 VIEW COMPARISON:  None Available. FINDINGS: There is no evidence of an acute pelvic fracture or diastasis. Chronic versus degenerative changes are seen involving the left inferior pubic ramus. IMPRESSION: Chronic versus degenerative changes involving the left inferior pubic ramus. Electronically Signed   By: Virgina Norfolk M.D.   On: 10/20/2022 22:47      Labs: BNP (last 3 results) No results for input(s): "BNP" in the last 8760 hours. Basic Metabolic Panel: Recent Labs  Lab 10/24/22 0422 10/25/22 0426 10/27/22 0629 10/29/22 1314 10/30/22 0327  NA 137  --  136 137 135  K 4.6  --  5.2* 5.2* 4.5  CL 111  --  105 100 102  CO2 22  --  25 23 23  $ GLUCOSE 87  --  115* 130* 106*  BUN 23  --  33* 45* 47*  CREATININE 2.18* 2.28* 2.10* 2.47* 2.24*  CALCIUM 8.7*  --  9.0 9.8 9.1  MG  --   --   --  2.1 2.1   Liver Function Tests: No results for input(s): "AST", "ALT", "ALKPHOS", "BILITOT", "PROT", "ALBUMIN" in the last 168 hours. No results for input(s): "LIPASE", "AMYLASE" in the last 168 hours. No results for input(s): "AMMONIA" in the last 168 hours. CBC: Recent Labs  Lab 10/25/22 0426 10/27/22 0629 10/29/22 1314 10/30/22 0327  WBC  --  11.1* 12.1* 10.9*  HGB 8.6* 8.9* 10.7* 9.1*  HCT  --  28.2* 34.9* 28.8*  MCV  --  90.1 92.3 90.9  PLT  --  291 387 335   Cardiac Enzymes: No results for input(s): "CKTOTAL", "CKMB", "CKMBINDEX", "TROPONINI" in the last 168 hours. BNP: Invalid input(s): "POCBNP" CBG: No results for input(s): "GLUCAP" in the last 168 hours. D-Dimer No results for input(s): "DDIMER" in the last 72 hours. Hgb A1c No results for input(s): "HGBA1C" in the last 72 hours. Lipid Profile No results for input(s): "CHOL", "HDL", "LDLCALC", "TRIG", "CHOLHDL", "LDLDIRECT" in the last 72 hours. Thyroid function studies No results for input(s): "TSH", "T4TOTAL", "T3FREE", "THYROIDAB" in the last 72 hours.  Invalid input(s): "FREET3" Anemia work up No results  for input(s): "VITAMINB12", "FOLATE", "FERRITIN", "TIBC", "IRON", "RETICCTPCT" in the last 72 hours. Urinalysis    Component Value Date/Time   COLORURINE YELLOW (A) 10/20/2022 2001   APPEARANCEUR CLEAR (A) 10/20/2022 2001   LABSPEC 1.014 10/20/2022 2001   PHURINE 5.0 10/20/2022 2001   GLUCOSEU NEGATIVE 10/20/2022 2001   HGBUR NEGATIVE 10/20/2022 2001   [place] NEGATIVE 10/20/2022 2001   KETONESUR NEGATIVE 10/20/2022 2001   PROTEINUR 100 (A) 10/20/2022 2001   NITRITE NEGATIVE 10/20/2022 2001   LEUKOCYTESUR NEGATIVE 10/20/2022 2001   Sepsis Labs Recent Labs  Lab 10/27/22 0629 10/29/22 1314 10/30/22 0327  WBC 11.1* 12.1* 10.9*   Microbiology No results found for this or any previous visit (from the past 240 hour(s)).   Total time spend on discharging this patient, including the last patient exam, discussing the hospital stay, instructions for ongoing care as it relates to all  pertinent caregivers, as well as preparing the medical discharge records, prescriptions, and/or referrals as applicable, is 30 minutes.    Enzo Bi, MD  [facility] 10/30/2022, 1:02 PM

## 2022-10-30 NOTE — TOC Transition Note (Signed)
Transition of Care [facility]) - CM/SW Discharge Note   Patient Details  Name: Ashley Mccullough MRN: [id number] Date of Birth: [birth year]  [facility] CM/SW Contact:  Gerilyn Pilgrim, LCSW Phone Number: 10/30/2022, 11:40 AM   Clinical Narrative:   Pt has orders to discharge to [facility] today. RN given number for report. CSW will notify family. Medical necessity form printed to unit.     Final next level of care: Skilled Nursing Facility Barriers to Discharge: Barriers Resolved   Patient Goals and CMS Choice CMS Medicare.gov Compare Post Acute Care list provided to:: Patient Choice offered to / list presented to : Patient  Discharge Placement                  Patient to be transferred to facility by: [facility] Name of family member notified: [facility] Patient and family notified of of transfer: 10/30/22  Discharge Plan and Services Additional resources added to the After Visit Summary for                                       Social Determinants of Health (SDOH) Interventions SDOH Screenings   Food Insecurity: No Food Insecurity (10/21/2022)  Housing: Low Risk  (10/21/2022)  Transportation Needs: No Transportation Needs (10/21/2022)  Utilities: Unknown (10/21/2022)  Alcohol Screen: Low Risk  (06/02/2022)  Depression (PHQ2-9): High Risk (06/02/2022)  Social Connections: Socially Isolated (03/04/2022)  Stress: No Stress Concern Present (03/04/2022)  Tobacco Use: Low Risk  (10/23/2022)     Readmission Risk Interventions     No data to display

## 2022-10-30 NOTE — Progress Notes (Signed)
Mobility Specialist - Progress Note    10/30/22 1000  Mobility  Activity Ambulated with assistance in hallway;Stood at bedside;Dangled on edge of bed  Level of Assistance Standby assist, set-up cues, supervision of patient - no hands on  Assistive Device Front wheel walker  Distance Ambulated (ft) 10 ft  Range of Motion/Exercises Active  Activity Response Tolerated well  Mobility Referral Yes  $Mobility charge 1 Mobility   Pt resting EOB upon entry. Pt STS and ambulates to bathroom SBA with AD. Pt returned to EOB and left with needs in reach.   Loma Sender Mobility Specialist 10/30/22, 10:46 AM

## 2022-10-30 NOTE — Care Management Important Message (Signed)
Important Message  Patient Details  Name: Ashley Mccullough MRN: [id number] Date of Birth: [birth year]   Medicare Important Message Given:  Yes     Juliann Pulse A Cline Draheim 10/30/2022, 11:01 AM

## 2022-10-31 DIAGNOSIS — E785 Hyperlipidemia, unspecified: Secondary | ICD-10-CM | POA: Diagnosis not present

## 2022-10-31 DIAGNOSIS — F411 Generalized anxiety disorder: Secondary | ICD-10-CM | POA: Diagnosis not present

## 2022-10-31 DIAGNOSIS — F339 Major depressive disorder, recurrent, unspecified: Secondary | ICD-10-CM | POA: Diagnosis not present

## 2022-10-31 DIAGNOSIS — R262 Difficulty in walking, not elsewhere classified: Secondary | ICD-10-CM | POA: Diagnosis not present

## 2022-10-31 DIAGNOSIS — I152 Hypertension secondary to endocrine disorders: Secondary | ICD-10-CM | POA: Diagnosis not present

## 2022-10-31 DIAGNOSIS — G894 Chronic pain syndrome: Secondary | ICD-10-CM | POA: Diagnosis not present

## 2022-10-31 DIAGNOSIS — D631 Anemia in chronic kidney disease: Secondary | ICD-10-CM | POA: Diagnosis not present

## 2022-10-31 DIAGNOSIS — K921 Melena: Secondary | ICD-10-CM | POA: Diagnosis not present

## 2022-10-31 DIAGNOSIS — Z6841 Body Mass Index (BMI) 40.0 and over, adult: Secondary | ICD-10-CM | POA: Diagnosis not present

## 2022-11-03 DIAGNOSIS — R5381 Other malaise: Secondary | ICD-10-CM | POA: Diagnosis not present

## 2022-11-03 NOTE — Progress Notes (Deleted)
      Established patient visit   Patient: Ashley Mccullough   DOB: [birth year]   62 y.o. Female  MRN: [id number] Visit Date: 11/04/2022  Today's healthcare provider: Gwyneth Sprout, FNP   No chief complaint on file.  Subjective    HPI  Follow up Hospitalization  Patient was admitted to [facility] on 10/20/22 and discharged on 10/30/22. She was treated for lower GI bleeding--suspected from Hemorrhoidal bleeding . Treatment for this included ***. Telephone follow up was done on *** She reports {excellent/good/fair:19992} compliance with treatment. She reports this condition is {resolved/improved/worsened:23923}.  ----------------------------------------------------------------------------------------- -   Medications: Outpatient Medications Prior to Visit  Medication Sig   acetaminophen (TYLENOL) 650 MG CR tablet Take 650-1,300 mg by mouth at bedtime. As needed for arthritis.   buPROPion (WELLBUTRIN XL) 150 MG 24 hr tablet Take 2 tablets (300 mg total) by mouth daily.   calcitRIOL (ROCALTROL) 0.25 MCG capsule TAKE 1 CAPSULE BY MOUTH 1 TIME EACH DAY   Cetirizine HCl 10 MG CAPS Take 1 capsule by mouth daily.   Cholecalciferol (VITAMIN D3) 5000 units TABS Take 5,000 tablets by mouth daily.   COLLAGEN PO Take by mouth.   FLUoxetine (PROZAC) 20 MG capsule Take 1 capsule (20 mg total) by mouth daily. Take once daily with 40 mg dose for total of 60 mg.   FLUoxetine (PROZAC) 40 MG capsule Take 1 capsule (40 mg total) by mouth daily. Take once daily with 20 mg dose for total of 60 mg.   gabapentin (NEURONTIN) 300 MG capsule TAKE TAKE 2 CAPSULES BY MOUTH 3 TIMES A DAY   hydrALAZINE (APRESOLINE) 50 MG tablet Take 1 tablet (50 mg total) by mouth 3 (three) times daily. Increased from 10 mg.   hydrocortisone (ANUSOL-HC) 25 MG suppository Place 1 suppository (25 mg total) rectally 2 (two) times daily for 10 days.   hydrOXYzine (VISTARIL) 25 MG capsule TAKE 1 CAPSULE (25 MG TOTAL) BY MOUTH EVERY 8  (EIGHT) HOURS AS NEEDED.   losartan-hydrochlorothiazide (HYZAAR) 100-25 MG tablet Take 1 tablet by mouth daily.   metoprolol succinate (TOPROL-XL) 25 MG 24 hr tablet TAKE 1 TABLET BY MOUTH DAILY   montelukast (SINGULAIR) 10 MG tablet Take 1 tablet (10 mg total) by mouth daily.   polyethylene glycol (MIRALAX / GLYCOLAX) 17 g packet Take 17 g by mouth 2 (two) times daily.   No facility-administered medications prior to visit.    Review of Systems  {Labs  Heme  Chem  Endocrine  Serology  Results Review (optional):23779}   Objective    There were no vitals taken for this visit. {Show previous vital signs (optional):23777}  Physical Exam  ***  No results found for any visits on 11/04/22.  Assessment & Plan     ***  No follow-ups on file.      {provider attestation***:1}   Gwyneth Sprout, [place] [phone] (phone) [phone] (fax)  [place]

## 2022-11-04 ENCOUNTER — Inpatient Hospital Stay: Payer: HMO | Admitting: Family Medicine

## 2022-11-04 DIAGNOSIS — E785 Hyperlipidemia, unspecified: Secondary | ICD-10-CM | POA: Diagnosis not present

## 2022-11-04 DIAGNOSIS — F339 Major depressive disorder, recurrent, unspecified: Secondary | ICD-10-CM | POA: Diagnosis not present

## 2022-11-04 DIAGNOSIS — R262 Difficulty in walking, not elsewhere classified: Secondary | ICD-10-CM | POA: Diagnosis not present

## 2022-11-04 DIAGNOSIS — G894 Chronic pain syndrome: Secondary | ICD-10-CM | POA: Diagnosis not present

## 2022-11-04 DIAGNOSIS — E1159 Type 2 diabetes mellitus with other circulatory complications: Secondary | ICD-10-CM | POA: Diagnosis not present

## 2022-11-04 DIAGNOSIS — E1169 Type 2 diabetes mellitus with other specified complication: Secondary | ICD-10-CM | POA: Diagnosis not present

## 2022-11-05 DIAGNOSIS — R262 Difficulty in walking, not elsewhere classified: Secondary | ICD-10-CM | POA: Diagnosis not present

## 2022-11-05 DIAGNOSIS — I152 Hypertension secondary to endocrine disorders: Secondary | ICD-10-CM | POA: Diagnosis not present

## 2022-11-05 DIAGNOSIS — F339 Major depressive disorder, recurrent, unspecified: Secondary | ICD-10-CM | POA: Diagnosis not present

## 2022-11-06 DIAGNOSIS — I152 Hypertension secondary to endocrine disorders: Secondary | ICD-10-CM | POA: Diagnosis not present

## 2022-11-06 DIAGNOSIS — F411 Generalized anxiety disorder: Secondary | ICD-10-CM | POA: Diagnosis not present

## 2022-11-06 DIAGNOSIS — E785 Hyperlipidemia, unspecified: Secondary | ICD-10-CM | POA: Diagnosis not present

## 2022-11-07 DIAGNOSIS — W19XXXA Unspecified fall, initial encounter: Secondary | ICD-10-CM | POA: Diagnosis not present

## 2022-11-07 DIAGNOSIS — G894 Chronic pain syndrome: Secondary | ICD-10-CM | POA: Diagnosis not present

## 2022-11-10 DIAGNOSIS — E785 Hyperlipidemia, unspecified: Secondary | ICD-10-CM | POA: Diagnosis not present

## 2022-11-10 DIAGNOSIS — F339 Major depressive disorder, recurrent, unspecified: Secondary | ICD-10-CM | POA: Diagnosis not present

## 2022-11-10 DIAGNOSIS — I152 Hypertension secondary to endocrine disorders: Secondary | ICD-10-CM | POA: Diagnosis not present

## 2022-11-11 DIAGNOSIS — E1169 Type 2 diabetes mellitus with other specified complication: Secondary | ICD-10-CM | POA: Diagnosis not present

## 2022-11-11 DIAGNOSIS — E785 Hyperlipidemia, unspecified: Secondary | ICD-10-CM | POA: Diagnosis not present

## 2022-11-11 DIAGNOSIS — E1159 Type 2 diabetes mellitus with other circulatory complications: Secondary | ICD-10-CM | POA: Diagnosis not present

## 2022-11-12 DIAGNOSIS — G894 Chronic pain syndrome: Secondary | ICD-10-CM | POA: Diagnosis not present

## 2022-11-12 DIAGNOSIS — F339 Major depressive disorder, recurrent, unspecified: Secondary | ICD-10-CM | POA: Diagnosis not present

## 2022-11-12 DIAGNOSIS — I152 Hypertension secondary to endocrine disorders: Secondary | ICD-10-CM | POA: Diagnosis not present

## 2022-11-14 DIAGNOSIS — K649 Unspecified hemorrhoids: Secondary | ICD-10-CM | POA: Diagnosis not present

## 2022-11-17 DIAGNOSIS — Z760 Encounter for issue of repeat prescription: Secondary | ICD-10-CM | POA: Diagnosis not present

## 2022-11-17 DIAGNOSIS — R52 Pain, unspecified: Secondary | ICD-10-CM | POA: Diagnosis not present

## 2022-11-20 DIAGNOSIS — F419 Anxiety disorder, unspecified: Secondary | ICD-10-CM | POA: Diagnosis not present

## 2022-11-21 DIAGNOSIS — I152 Hypertension secondary to endocrine disorders: Secondary | ICD-10-CM | POA: Diagnosis not present

## 2022-11-21 DIAGNOSIS — E785 Hyperlipidemia, unspecified: Secondary | ICD-10-CM | POA: Diagnosis not present

## 2022-11-21 DIAGNOSIS — F339 Major depressive disorder, recurrent, unspecified: Secondary | ICD-10-CM | POA: Diagnosis not present

## 2022-11-24 DIAGNOSIS — F411 Generalized anxiety disorder: Secondary | ICD-10-CM | POA: Diagnosis not present

## 2022-11-24 DIAGNOSIS — F419 Anxiety disorder, unspecified: Secondary | ICD-10-CM | POA: Diagnosis not present

## 2022-11-24 DIAGNOSIS — E785 Hyperlipidemia, unspecified: Secondary | ICD-10-CM | POA: Diagnosis not present

## 2022-11-24 DIAGNOSIS — F331 Major depressive disorder, recurrent, moderate: Secondary | ICD-10-CM | POA: Diagnosis not present

## 2022-11-24 DIAGNOSIS — F339 Major depressive disorder, recurrent, unspecified: Secondary | ICD-10-CM | POA: Diagnosis not present

## 2022-11-24 DIAGNOSIS — F5105 Insomnia due to other mental disorder: Secondary | ICD-10-CM | POA: Diagnosis not present

## 2022-11-25 DIAGNOSIS — Z6841 Body Mass Index (BMI) 40.0 and over, adult: Secondary | ICD-10-CM | POA: Diagnosis not present

## 2022-11-25 DIAGNOSIS — F339 Major depressive disorder, recurrent, unspecified: Secondary | ICD-10-CM | POA: Diagnosis not present

## 2022-11-25 DIAGNOSIS — E785 Hyperlipidemia, unspecified: Secondary | ICD-10-CM | POA: Diagnosis not present

## 2022-11-25 DIAGNOSIS — F411 Generalized anxiety disorder: Secondary | ICD-10-CM | POA: Diagnosis not present

## 2022-11-25 DIAGNOSIS — G894 Chronic pain syndrome: Secondary | ICD-10-CM | POA: Diagnosis not present

## 2022-11-25 DIAGNOSIS — N184 Chronic kidney disease, stage 4 (severe): Secondary | ICD-10-CM | POA: Diagnosis not present

## 2022-11-25 DIAGNOSIS — R262 Difficulty in walking, not elsewhere classified: Secondary | ICD-10-CM | POA: Diagnosis not present

## 2022-11-27 ENCOUNTER — Telehealth: Payer: Self-pay

## 2022-11-27 NOTE — Telephone Encounter (Signed)
Copied from [place] [id number]. Topic: General - Inquiry >> Nov 27, 2022  1:17 PM Erskine Squibb wrote: Reason for CRM: The Aunt, Rogene [place],  of the patient called in stating the patient has been released from [place]. They do not set up the patient at home once they are released and told the patient to call her provider to set up home health and custodial care. Please assist patient further

## 2022-11-27 NOTE — Telephone Encounter (Signed)
It is standard for patients to do a TOC visit (in person or virtual) with PCP after discharge from facility so face-to-face criteria are met for ordering [facility] services. Recommend scheduling patient to be seen and get the services set up

## 2022-11-28 DIAGNOSIS — Z6841 Body Mass Index (BMI) 40.0 and over, adult: Secondary | ICD-10-CM | POA: Diagnosis not present

## 2022-11-28 DIAGNOSIS — G4733 Obstructive sleep apnea (adult) (pediatric): Secondary | ICD-10-CM | POA: Diagnosis not present

## 2022-11-28 DIAGNOSIS — F339 Major depressive disorder, recurrent, unspecified: Secondary | ICD-10-CM | POA: Diagnosis not present

## 2022-11-28 DIAGNOSIS — D631 Anemia in chronic kidney disease: Secondary | ICD-10-CM | POA: Diagnosis not present

## 2022-11-28 DIAGNOSIS — M48062 Spinal stenosis, lumbar region with neurogenic claudication: Secondary | ICD-10-CM | POA: Diagnosis not present

## 2022-11-28 DIAGNOSIS — E785 Hyperlipidemia, unspecified: Secondary | ICD-10-CM | POA: Diagnosis not present

## 2022-11-28 DIAGNOSIS — Z9181 History of falling: Secondary | ICD-10-CM | POA: Diagnosis not present

## 2022-11-28 DIAGNOSIS — F411 Generalized anxiety disorder: Secondary | ICD-10-CM | POA: Diagnosis not present

## 2022-11-28 DIAGNOSIS — E669 Obesity, unspecified: Secondary | ICD-10-CM | POA: Diagnosis not present

## 2022-11-28 DIAGNOSIS — N184 Chronic kidney disease, stage 4 (severe): Secondary | ICD-10-CM | POA: Diagnosis not present

## 2022-11-28 DIAGNOSIS — G894 Chronic pain syndrome: Secondary | ICD-10-CM | POA: Diagnosis not present

## 2022-11-28 DIAGNOSIS — K648 Other hemorrhoids: Secondary | ICD-10-CM | POA: Diagnosis not present

## 2022-11-28 NOTE — Telephone Encounter (Signed)
[name] calling from [facility] is calling to see if Ashley Mccullough will follow on [facility]. Please advise [id number] Verbal ok on VM

## 2022-11-28 NOTE — Telephone Encounter (Signed)
[name] advised of approved orders. As per previous note by Dr. Jacinto Reap.

## 2022-12-02 ENCOUNTER — Telehealth: Payer: Self-pay

## 2022-12-02 ENCOUNTER — Telehealth (INDEPENDENT_AMBULATORY_CARE_PROVIDER_SITE_OTHER): Payer: HMO | Admitting: Family Medicine

## 2022-12-02 ENCOUNTER — Encounter: Payer: Self-pay | Admitting: Family Medicine

## 2022-12-02 DIAGNOSIS — E669 Obesity, unspecified: Secondary | ICD-10-CM | POA: Diagnosis not present

## 2022-12-02 DIAGNOSIS — K648 Other hemorrhoids: Secondary | ICD-10-CM | POA: Diagnosis not present

## 2022-12-02 DIAGNOSIS — M48062 Spinal stenosis, lumbar region with neurogenic claudication: Secondary | ICD-10-CM | POA: Diagnosis not present

## 2022-12-02 DIAGNOSIS — Z6841 Body Mass Index (BMI) 40.0 and over, adult: Secondary | ICD-10-CM | POA: Diagnosis not present

## 2022-12-02 DIAGNOSIS — E785 Hyperlipidemia, unspecified: Secondary | ICD-10-CM | POA: Diagnosis not present

## 2022-12-02 DIAGNOSIS — Z794 Long term (current) use of insulin: Secondary | ICD-10-CM | POA: Insufficient documentation

## 2022-12-02 DIAGNOSIS — N184 Chronic kidney disease, stage 4 (severe): Secondary | ICD-10-CM | POA: Diagnosis not present

## 2022-12-02 DIAGNOSIS — F411 Generalized anxiety disorder: Secondary | ICD-10-CM | POA: Diagnosis not present

## 2022-12-02 DIAGNOSIS — E1122 Type 2 diabetes mellitus with diabetic chronic kidney disease: Secondary | ICD-10-CM | POA: Diagnosis not present

## 2022-12-02 DIAGNOSIS — D631 Anemia in chronic kidney disease: Secondary | ICD-10-CM | POA: Diagnosis not present

## 2022-12-02 DIAGNOSIS — Z9181 History of falling: Secondary | ICD-10-CM | POA: Diagnosis not present

## 2022-12-02 DIAGNOSIS — F419 Anxiety disorder, unspecified: Secondary | ICD-10-CM | POA: Diagnosis not present

## 2022-12-02 DIAGNOSIS — G4733 Obstructive sleep apnea (adult) (pediatric): Secondary | ICD-10-CM | POA: Diagnosis not present

## 2022-12-02 DIAGNOSIS — F339 Major depressive disorder, recurrent, unspecified: Secondary | ICD-10-CM | POA: Diagnosis not present

## 2022-12-02 DIAGNOSIS — N1832 Chronic kidney disease, stage 3b: Secondary | ICD-10-CM | POA: Insufficient documentation

## 2022-12-02 DIAGNOSIS — G894 Chronic pain syndrome: Secondary | ICD-10-CM | POA: Diagnosis not present

## 2022-12-02 MED ORDER — TIRZEPATIDE 5 MG/0.5ML ~~LOC~~ SOAJ: 5.0000 mg | SUBCUTANEOUS | 0 refills | Status: DC

## 2022-12-02 MED ORDER — HYDROCODONE-ACETAMINOPHEN 7.5-325 MG PO TABS: 1.0000 | ORAL_TABLET | Freq: Four times a day (QID) | ORAL | 0 refills | Status: DC | PRN

## 2022-12-02 MED ORDER — FLUOXETINE HCL 20 MG PO CAPS: 60.0000 mg | ORAL_CAPSULE | Freq: Every day | ORAL | 3 refills | Status: DC

## 2022-12-02 NOTE — Progress Notes (Unsigned)
I,Ashley Mccullough,acting as a Education administrator for Gwyneth Sprout, FNP.,have documented all relevant documentation on the behalf of Gwyneth Sprout, FNP,as directed by  Gwyneth Sprout, FNP while in the presence of Gwyneth Sprout, FNP.   MyChart Video Visit    Virtual Visit via Video Note   This format is felt to be most appropriate for this patient at this time. Physical exam was limited by quality of the video and audio technology used for the visit.   Patient location: home, with sister Provider location: [facility] [address]  [address] [place], [place] [address]   I discussed the limitations of evaluation and management by telemedicine and the availability of in person appointments. The patient expressed understanding and agreed to proceed.  Patient: Ashley Mccullough   DOB: [birth year]   62 y.o. Female  MRN: [id number] Visit Date: 12/02/2022  Today's healthcare provider: Gwyneth Sprout, FNP  Re Introduced to nurse practitioner role and practice setting.  All questions answered.  Discussed provider/patient relationship and expectations.  Chief Complaint  Patient presents with   Hospitalization Follow-up   Subjective    HPI  Follow up Hospitalization  Patient was admitted to [facility] on 10/20/22 and discharged on 10/30/22. She was treated for intermittent rectal bleeding. Chronic back pain.  Treatment for this included labs, colonoscopy. X-ray, MRI, PT recommended Rehab. Telephone follow up was done on none She reports excellent compliance with treatment. She reports this condition is improved. Patient requesting fluoxetine 60 mg prescription.   ----------------------------------------------------------------------------------------- -    Medications: Outpatient Medications Prior to Visit  Medication Sig   acetaminophen (TYLENOL) 650 MG CR tablet Take 650-1,300 mg by mouth at bedtime. As needed for arthritis.   buPROPion (WELLBUTRIN XL) 150 MG 24 hr tablet Take  2 tablets (300 mg total) by mouth daily. (Patient taking differently: Take 150 mg by mouth daily.)   calcitRIOL (ROCALTROL) 0.25 MCG capsule TAKE 1 CAPSULE BY MOUTH 1 TIME EACH DAY   Cetirizine HCl 10 MG CAPS Take 1 capsule by mouth daily.   COLLAGEN PO Take by mouth.   gabapentin (NEURONTIN) 300 MG capsule TAKE TAKE 2 CAPSULES BY MOUTH 3 TIMES A DAY   hydrALAZINE (APRESOLINE) 50 MG tablet Take 1 tablet (50 mg total) by mouth 3 (three) times daily. Increased from 10 mg.   hydrocortisone (ANUSOL-HC) 25 MG suppository Place 25 mg rectally 2 (two) times daily as needed.   hydrOXYzine (VISTARIL) 25 MG capsule TAKE 1 CAPSULE (25 MG TOTAL) BY MOUTH EVERY 8 (EIGHT) HOURS AS NEEDED.   losartan (COZAAR) 100 MG tablet Take 100 mg by mouth daily.   metoprolol succinate (TOPROL-XL) 25 MG 24 hr tablet TAKE 1 TABLET BY MOUTH DAILY   montelukast (SINGULAIR) 10 MG tablet Take 1 tablet (10 mg total) by mouth daily.   polyethylene glycol (MIRALAX / GLYCOLAX) 17 g packet Take 17 g by mouth 2 (two) times daily.   traZODone (DESYREL) 50 MG tablet Take 50 mg by mouth at bedtime as needed for sleep.   [DISCONTINUED] HYDROcodone-acetaminophen (NORCO/VICODIN) 5-325 MG tablet Take 1 tablet by mouth every 4 (four) hours as needed for severe pain.   [DISCONTINUED] Cholecalciferol (VITAMIN D3) 5000 units TABS Take 5,000 tablets by mouth daily. (Patient not taking: Reported on 12/02/2022)   [DISCONTINUED] FLUoxetine (PROZAC) 20 MG capsule Take 1 capsule (20 mg total) by mouth daily. Take once daily with 40 mg dose for total of 60 mg. (Patient taking differently: Take 60 mg  by mouth daily. Take once daily with 40 mg dose for total of 60 mg.)   [DISCONTINUED] FLUoxetine (PROZAC) 40 MG capsule Take 1 capsule (40 mg total) by mouth daily. Take once daily with 20 mg dose for total of 60 mg.   [DISCONTINUED] losartan-hydrochlorothiazide (HYZAAR) 100-25 MG tablet Take 1 tablet by mouth daily.   No facility-administered medications  prior to visit.    Review of Systems  Constitutional:  Negative for chills and fever.  Respiratory:  Negative for cough, chest tightness and shortness of breath.   Cardiovascular:  Negative for chest pain and leg swelling.  Gastrointestinal:  Negative for abdominal pain, blood in stool, nausea and vomiting.  Musculoskeletal:  Positive for back pain and myalgias.    Objective    There were no vitals taken for this visit.  Physical Exam Constitutional:      Appearance: Normal appearance. She is obese.  Pulmonary:     Comments: Family reports "noisy" breathing. Unable to appreciated over AV. Neurological:     Mental Status: She is alert and oriented to person, place, and time.  Psychiatric:        Mood and Affect: Mood normal.        Behavior: Behavior normal.        Thought Content: Thought content normal.        Judgment: Judgment normal.     Assessment & Plan     Problem List Items Addressed This Visit       Endocrine   Type 2 diabetes mellitus with stage 3b chronic kidney disease, with long-term current use of insulin (HCC)    Chronic, unknown Patient has not been for OV or labs in >6 months Previously elevated  Has been referred to nephrology to assist with kidney preservation However, patient does not wish to go back to Dr Lanora Manis  Encouraged to come in for OV once strength is improved with use of HH      Relevant Medications   losartan (COZAAR) 100 MG tablet   tirzepatide (MOUNJARO) 5 MG/0.5ML Pen     Other   Anxiety    Acute on chronic, related to health state Remains home bound at this time given mobility and morbid obesity       Relevant Medications   traZODone (DESYREL) 50 MG tablet   FLUoxetine (PROZAC) 20 MG capsule   Spinal stenosis of lumbar region with neurogenic claudication - Primary    Chronic, worsening Wishes to see Spinal team at [facility]; however, unable to get in for an appt at this time given mobility state       Relevant Medications    traZODone (DESYREL) 50 MG tablet   FLUoxetine (PROZAC) 20 MG capsule   HYDROcodone-acetaminophen (NORCO) 7.5-325 MG tablet   Other Relevant Orders   Ambulatory referral to Spine Surgery   Return if symptoms worsen or fail to improve.    I discussed the assessment and treatment plan with the patient. The patient was provided an opportunity to ask questions and all were answered. The patient agreed with the plan and demonstrated an understanding of the instructions.   The patient was advised to call back or seek an in-person evaluation if the symptoms worsen or if the condition fails to improve as anticipated.  I provided 25 minutes of face-to-face time during this encounter discussing care following hospital stay and home rehabilitation.  Vonna Kotyk, FNP, have reviewed all documentation for this visit. The documentation on 12/03/22 for the exam,  diagnosis, procedures, and orders are all accurate and complete.   Gwyneth Sprout, [place] [phone] (phone) [phone] (fax)  [place]

## 2022-12-02 NOTE — Telephone Encounter (Signed)
Copied from [place] [id number]. Topic: Quick Communication - Home Health Verbal Orders >> Dec 02, 2022  3:58 PM Everette C wrote: Caller/Agency: Leory Plowman Callback Number: [phone] Requesting OT/PT/Skilled Nursing/Social Work/Speech Therapy: OT Frequency: 1w6

## 2022-12-02 NOTE — Telephone Encounter (Signed)
Stephanie advised.

## 2022-12-03 ENCOUNTER — Encounter: Payer: Self-pay | Admitting: Family Medicine

## 2022-12-03 NOTE — Assessment & Plan Note (Signed)
Acute on chronic, related to health state Remains home bound at this time given mobility and morbid obesity

## 2022-12-03 NOTE — Addendum Note (Signed)
Addended by: Tally Joe T on: 12/03/2022 09:44 PM   Modules accepted: Level of Service

## 2022-12-03 NOTE — Assessment & Plan Note (Signed)
Chronic, unknown Patient has not been for OV or labs in >6 months Previously elevated  Has been referred to nephrology to assist with kidney preservation However, patient does not wish to go back to Dr Lanora Manis  Encouraged to come in for OV once strength is improved with use of HH

## 2022-12-03 NOTE — Assessment & Plan Note (Signed)
Chronic, worsening Wishes to see Spinal team at [facility]; however, unable to get in for an appt at this time given mobility state

## 2022-12-09 ENCOUNTER — Other Ambulatory Visit: Payer: Self-pay | Admitting: Family Medicine

## 2022-12-09 ENCOUNTER — Encounter: Payer: Self-pay | Admitting: Family Medicine

## 2022-12-09 DIAGNOSIS — I10 Essential (primary) hypertension: Secondary | ICD-10-CM

## 2022-12-09 MED ORDER — HYDRALAZINE HCL 50 MG PO TABS: 50.0000 mg | ORAL_TABLET | Freq: Three times a day (TID) | ORAL | 3 refills | Status: DC

## 2022-12-12 DIAGNOSIS — F339 Major depressive disorder, recurrent, unspecified: Secondary | ICD-10-CM | POA: Diagnosis not present

## 2022-12-12 DIAGNOSIS — D631 Anemia in chronic kidney disease: Secondary | ICD-10-CM | POA: Diagnosis not present

## 2022-12-12 DIAGNOSIS — Z9181 History of falling: Secondary | ICD-10-CM | POA: Diagnosis not present

## 2022-12-12 DIAGNOSIS — M48062 Spinal stenosis, lumbar region with neurogenic claudication: Secondary | ICD-10-CM | POA: Diagnosis not present

## 2022-12-12 DIAGNOSIS — E785 Hyperlipidemia, unspecified: Secondary | ICD-10-CM | POA: Diagnosis not present

## 2022-12-12 DIAGNOSIS — G894 Chronic pain syndrome: Secondary | ICD-10-CM | POA: Diagnosis not present

## 2022-12-12 DIAGNOSIS — F411 Generalized anxiety disorder: Secondary | ICD-10-CM | POA: Diagnosis not present

## 2022-12-12 DIAGNOSIS — K648 Other hemorrhoids: Secondary | ICD-10-CM | POA: Diagnosis not present

## 2022-12-12 DIAGNOSIS — G4733 Obstructive sleep apnea (adult) (pediatric): Secondary | ICD-10-CM | POA: Diagnosis not present

## 2022-12-12 DIAGNOSIS — Z6841 Body Mass Index (BMI) 40.0 and over, adult: Secondary | ICD-10-CM | POA: Diagnosis not present

## 2022-12-12 DIAGNOSIS — N184 Chronic kidney disease, stage 4 (severe): Secondary | ICD-10-CM | POA: Diagnosis not present

## 2022-12-12 DIAGNOSIS — E669 Obesity, unspecified: Secondary | ICD-10-CM | POA: Diagnosis not present

## 2022-12-16 DIAGNOSIS — Z6841 Body Mass Index (BMI) 40.0 and over, adult: Secondary | ICD-10-CM | POA: Diagnosis not present

## 2022-12-16 DIAGNOSIS — M48062 Spinal stenosis, lumbar region with neurogenic claudication: Secondary | ICD-10-CM | POA: Diagnosis not present

## 2022-12-16 DIAGNOSIS — M6281 Muscle weakness (generalized): Secondary | ICD-10-CM | POA: Diagnosis not present

## 2022-12-16 DIAGNOSIS — R262 Difficulty in walking, not elsewhere classified: Secondary | ICD-10-CM | POA: Diagnosis not present

## 2022-12-17 DIAGNOSIS — E669 Obesity, unspecified: Secondary | ICD-10-CM | POA: Diagnosis not present

## 2022-12-17 DIAGNOSIS — G4733 Obstructive sleep apnea (adult) (pediatric): Secondary | ICD-10-CM | POA: Diagnosis not present

## 2022-12-17 DIAGNOSIS — F411 Generalized anxiety disorder: Secondary | ICD-10-CM | POA: Diagnosis not present

## 2022-12-17 DIAGNOSIS — K648 Other hemorrhoids: Secondary | ICD-10-CM | POA: Diagnosis not present

## 2022-12-17 DIAGNOSIS — Z6841 Body Mass Index (BMI) 40.0 and over, adult: Secondary | ICD-10-CM | POA: Diagnosis not present

## 2022-12-17 DIAGNOSIS — F339 Major depressive disorder, recurrent, unspecified: Secondary | ICD-10-CM | POA: Diagnosis not present

## 2022-12-17 DIAGNOSIS — N184 Chronic kidney disease, stage 4 (severe): Secondary | ICD-10-CM | POA: Diagnosis not present

## 2022-12-17 DIAGNOSIS — D631 Anemia in chronic kidney disease: Secondary | ICD-10-CM | POA: Diagnosis not present

## 2022-12-17 DIAGNOSIS — Z9181 History of falling: Secondary | ICD-10-CM | POA: Diagnosis not present

## 2022-12-17 DIAGNOSIS — M48062 Spinal stenosis, lumbar region with neurogenic claudication: Secondary | ICD-10-CM | POA: Diagnosis not present

## 2022-12-17 DIAGNOSIS — G894 Chronic pain syndrome: Secondary | ICD-10-CM | POA: Diagnosis not present

## 2022-12-17 DIAGNOSIS — E785 Hyperlipidemia, unspecified: Secondary | ICD-10-CM | POA: Diagnosis not present

## 2022-12-18 ENCOUNTER — Telehealth (INDEPENDENT_AMBULATORY_CARE_PROVIDER_SITE_OTHER): Payer: HMO | Admitting: Physician Assistant

## 2022-12-18 ENCOUNTER — Ambulatory Visit: Payer: Self-pay | Admitting: *Deleted

## 2022-12-18 ENCOUNTER — Encounter: Payer: Self-pay | Admitting: Physician Assistant

## 2022-12-18 VITALS — BP 118/62 | HR 78 | Temp 98.2°F | Ht 63.0 in | Wt 355.0 lb

## 2022-12-18 DIAGNOSIS — R42 Dizziness and giddiness: Secondary | ICD-10-CM

## 2022-12-18 DIAGNOSIS — K648 Other hemorrhoids: Secondary | ICD-10-CM | POA: Diagnosis not present

## 2022-12-18 DIAGNOSIS — Z6841 Body Mass Index (BMI) 40.0 and over, adult: Secondary | ICD-10-CM | POA: Diagnosis not present

## 2022-12-18 DIAGNOSIS — G894 Chronic pain syndrome: Secondary | ICD-10-CM | POA: Diagnosis not present

## 2022-12-18 DIAGNOSIS — E1159 Type 2 diabetes mellitus with other circulatory complications: Secondary | ICD-10-CM | POA: Diagnosis not present

## 2022-12-18 DIAGNOSIS — E1122 Type 2 diabetes mellitus with diabetic chronic kidney disease: Secondary | ICD-10-CM | POA: Diagnosis not present

## 2022-12-18 DIAGNOSIS — G4733 Obstructive sleep apnea (adult) (pediatric): Secondary | ICD-10-CM | POA: Diagnosis not present

## 2022-12-18 DIAGNOSIS — N1832 Chronic kidney disease, stage 3b: Secondary | ICD-10-CM

## 2022-12-18 DIAGNOSIS — K921 Melena: Secondary | ICD-10-CM

## 2022-12-18 DIAGNOSIS — N184 Chronic kidney disease, stage 4 (severe): Secondary | ICD-10-CM | POA: Diagnosis not present

## 2022-12-18 DIAGNOSIS — Z9181 History of falling: Secondary | ICD-10-CM | POA: Diagnosis not present

## 2022-12-18 DIAGNOSIS — I152 Hypertension secondary to endocrine disorders: Secondary | ICD-10-CM | POA: Diagnosis not present

## 2022-12-18 DIAGNOSIS — E669 Obesity, unspecified: Secondary | ICD-10-CM | POA: Diagnosis not present

## 2022-12-18 DIAGNOSIS — Z794 Long term (current) use of insulin: Secondary | ICD-10-CM | POA: Diagnosis not present

## 2022-12-18 DIAGNOSIS — D631 Anemia in chronic kidney disease: Secondary | ICD-10-CM | POA: Diagnosis not present

## 2022-12-18 DIAGNOSIS — F339 Major depressive disorder, recurrent, unspecified: Secondary | ICD-10-CM | POA: Diagnosis not present

## 2022-12-18 DIAGNOSIS — F411 Generalized anxiety disorder: Secondary | ICD-10-CM | POA: Diagnosis not present

## 2022-12-18 DIAGNOSIS — M48062 Spinal stenosis, lumbar region with neurogenic claudication: Secondary | ICD-10-CM | POA: Diagnosis not present

## 2022-12-18 DIAGNOSIS — E785 Hyperlipidemia, unspecified: Secondary | ICD-10-CM | POA: Diagnosis not present

## 2022-12-18 MED ORDER — BLOOD GLUCOSE MONITORING SUPPL DEVI: 1.0000 | Freq: Three times a day (TID) | 0 refills | Status: AC

## 2022-12-18 MED ORDER — BLOOD GLUCOSE TEST VI STRP: 1.0000 | ORAL_STRIP | Freq: Three times a day (TID) | 0 refills | Status: AC

## 2022-12-18 MED ORDER — CARETOUCH BP ARM MONITOR DEVI
0 refills | Status: AC
Start: 2022-12-18 — End: ?

## 2022-12-18 MED ORDER — LANCETS MISC. MISC
1.0000 | Freq: Three times a day (TID) | 0 refills | Status: AC
Start: 2022-12-18 — End: 2023-01-17

## 2022-12-18 MED ORDER — LANCET DEVICE MISC
1.0000 | Freq: Three times a day (TID) | 0 refills | Status: AC
Start: 2022-12-18 — End: 2023-01-17

## 2022-12-18 NOTE — Progress Notes (Signed)
[place] P[place],acting as a scribe for Mikey Kirschner, PA-C.,have documented all relevant documentation on the behalf of Mikey Kirschner, PA-C,as directed by  Mikey Kirschner, PA-C while in the presence of Mikey Kirschner, PA-C.   MyChart Video Visit    Virtual Visit via Video Note   This format is felt to be most appropriate for this patient at this time. Physical exam was limited by quality of the video and audio technology used for the visit.   Patient location: home Provider location: office  I discussed the limitations of evaluation and management by telemedicine and the availability of in person appointments. The patient expressed understanding and agreed to proceed.  Patient: Ashley Mccullough   DOB: 1961-06-18   61 y.o. Female  MRN: [id number] Visit Date: 12/18/2022  Today's healthcare provider: Mikey Kirschner, PA-C   Chief Complaint  Patient presents with   Dizziness   Subjective    HPI  Patient is a 62 year old female who presents via video visit for complain of dizziness and nausea.  She does not experience spinning of the room, headache or visual disturbance.  She states this has been occurring for a few days.  She reports her occupational therapist took her blood pressure and it was 118/42.   She said she noticed it when her Hydralazine went from 10 mh TID to 50 mg TID.     Medications: Outpatient Medications Prior to Visit  Medication Sig   acetaminophen (TYLENOL) 650 MG CR tablet Take 650-1,300 mg by mouth at bedtime. As needed for arthritis.   buPROPion (WELLBUTRIN XL) 150 MG 24 hr tablet Take 2 tablets (300 mg total) by mouth daily. (Patient taking differently: Take 150 mg by mouth daily.)   calcitRIOL (ROCALTROL) 0.25 MCG capsule TAKE 1 CAPSULE BY MOUTH 1 TIME EACH DAY   Cetirizine HCl 10 MG CAPS Take 1 capsule by mouth daily.   COLLAGEN PO Take by mouth.   FLUoxetine (PROZAC) 20 MG capsule Take 3 capsules (60 mg total) by mouth daily. Take 3 capsules  daily.   gabapentin (NEURONTIN) 300 MG capsule TAKE TAKE 2 CAPSULES BY MOUTH 3 TIMES A DAY   hydrALAZINE (APRESOLINE) 50 MG tablet Take 1 tablet (50 mg total) by mouth 3 (three) times daily. Increased from 10 mg.   HYDROcodone-acetaminophen (NORCO) 7.5-325 MG tablet Take 1 tablet by mouth every 6 (six) hours as needed for severe pain. Take 1 tablet every 6 hours as needed.   hydrocortisone (ANUSOL-HC) 25 MG suppository Place 25 mg rectally 2 (two) times daily as needed.   hydrOXYzine (VISTARIL) 25 MG capsule TAKE 1 CAPSULE (25 MG TOTAL) BY MOUTH EVERY 8 (EIGHT) HOURS AS NEEDED.   losartan (COZAAR) 100 MG tablet Take 100 mg by mouth daily.   metoprolol succinate (TOPROL-XL) 25 MG 24 hr tablet TAKE 1 TABLET BY MOUTH DAILY   montelukast (SINGULAIR) 10 MG tablet Take 1 tablet (10 mg total) by mouth daily.   polyethylene glycol (MIRALAX / GLYCOLAX) 17 g packet Take 17 g by mouth 2 (two) times daily.   tirzepatide [facility]) 5 MG/0.5ML Pen Inject 5 mg into the skin once a week.   traZODone (DESYREL) 50 MG tablet Take 50 mg by mouth at bedtime as needed for sleep.   No facility-administered medications prior to visit.    Review of Systems  Eyes:  Negative for visual disturbance.  Respiratory:  Negative for shortness of breath.   Cardiovascular:  Negative for chest pain.  Gastrointestinal:  Positive for  nausea.  Neurological:  Positive for dizziness. Negative for headaches.     Objective    BP 118/62 (BP Location: Right Arm, Patient Position: Sitting, Cuff Size: Normal)   Pulse 78   Temp 98.2 F (36.8 C) (Temporal)   Ht 5\' 3"  (1.6 m)   Wt (!) 355 lb (161 kg)   SpO2 96%   BMI 62.89 kg/m    Physical Exam Constitutional:      Appearance: Normal appearance. She is obese.  Neurological:     General: No focal deficit present.     Mental Status: She is oriented to person, place, and time.  Psychiatric:        Mood and Affect: Mood normal.        Behavior: Behavior normal.         Assessment & Plan     Dizziness 2. Hypertension Advise cutting from 50 mg TID to 25 mg TID Increase fluids   2. Hematochezia Pt did have one recurring episode, she was anemic previously. Will speak to pcp about further management   Return if symptoms worsen or fail to improve.     I discussed the assessment and treatment plan with the patient. The patient was provided an opportunity to ask questions and all were answered. The patient agreed with the plan and demonstrated an understanding of the instructions.   The patient was advised to call back or seek an in-person evaluation if the symptoms worsen or if the condition fails to improve as anticipated.  I provided 10 minutes of non-face-to-face time during this encounter. I, Mikey Kirschner, PA-C have reviewed all documentation for this visit. The documentation on  12/18/22  for the exam, diagnosis, procedures, and orders are all accurate and complete.  Mikey Kirschner, PA-C [facility] [address] [place], [place], [address] Office: [phone] Fax: [place]

## 2022-12-18 NOTE — Telephone Encounter (Signed)
Reason for Disposition  [1] MODERATE dizziness (e.g., interferes with normal activities) AND [2] has NOT been evaluated by doctor (or NP/PA) for this  (Exception: Dizziness caused by heat exposure, sudden standing, or poor fluid intake.)  Answer Assessment - Initial Assessment Questions 1. DESCRIPTION: "Describe your dizziness."     Nausea- dizzy, when stands feels faint 2. LIGHTHEADED: "Do you feel lightheaded?" (e.g., somewhat faint, woozy, weak upon standing)     yes 3. VERTIGO: "Do you feel like either you or the room is spinning or tilting?" (i.e. vertigo)     no 4. SEVERITY: "How bad is it?"  "Do you feel like you are going to faint?" "Can you stand and walk?"   - MILD: Feels slightly dizzy, but walking normally.   - MODERATE: Feels unsteady when walking, but not falling; interferes with normal activities (e.g., school, work).   - SEVERE: Unable to walk without falling, or requires assistance to walk without falling; feels like passing out now.      Using walker- is able to walk after standing for moment 5. ONSET:  "When did the dizziness begin?"     2 days ago 6. AGGRAVATING FACTORS: "Does anything make it worse?" (e.g., standing, change in head position)     none 7. HEART RATE: "Can you tell me your heart rate?" "How many beats in 15 seconds?"  (Note: not all patients can do this)       Not checking 8. CAUSE: "What do you think is causing the dizziness?"     Medication change - was not aware of change 9. RECURRENT SYMPTOM: "Have you had dizziness before?" If Yes, ask: "When was the last time?" "What happened that time?"     Yes- 3-4 weeks ago 10. OTHER SYMPTOMS: "Do you have any other symptoms?" (e.g., fever, chest pain, vomiting, diarrhea, bleeding)       nausea  Protocols used: Dizziness - Lightheadedness-A-AH

## 2022-12-18 NOTE — Telephone Encounter (Signed)
  Chief Complaint: dizziness- recent medication changes  Symptoms: dizziness, nausea Frequency: 2 days Pertinent Negatives: Patient denies fever, chest pain, vomiting, diarrhea, bleeding Disposition: [] ED /[] Urgent Care (no appt availability in office) / [x] Appointment(In office/virtual)/ []  [facility]/ [] Home Care/ [] Refused Recommended Disposition /[]  Mobile Bus/ []  Follow-up with PCP Additional Notes: Patient is unable to come to office today due to her physical issues and stairs at home- patient has been scheduled for a virtual visit to evaluate her symptoms and decide what next steps are for her

## 2022-12-29 ENCOUNTER — Other Ambulatory Visit: Payer: Self-pay | Admitting: Family Medicine

## 2022-12-29 DIAGNOSIS — I10 Essential (primary) hypertension: Secondary | ICD-10-CM

## 2022-12-30 ENCOUNTER — Other Ambulatory Visit: Payer: Self-pay | Admitting: Family Medicine

## 2022-12-30 MED ORDER — TIRZEPATIDE 7.5 MG/0.5ML ~~LOC~~ SOAJ: 7.5000 mg | SUBCUTANEOUS | 0 refills | Status: DC

## 2022-12-30 NOTE — Telephone Encounter (Signed)
Requested medication (s) are due for refill today: review  Requested medication (s) are on the active medication list: yes  Last refill:  12/02/22 #62mL/0  Future visit scheduled: no  Notes to clinic:  Pharmacy comment: Alternative Requested:PA REQUIRED.      Requested Prescriptions  Pending Prescriptions Disp Refills   MOUNJARO 5 MG/0.5ML Pen [Pharmacy Med Name: MOUNJARO 5 MG/0.5 ML PEN]  0    Sig: INJECT 5 MG SUBCUTANEOUSLY WEEKLY     Off-Protocol Failed - 12/29/2022  9:29 AM      Failed - Medication not assigned to a protocol, review manually.      Passed - Valid encounter within last 12 months    Recent Outpatient Visits           1 week ago Dizziness   [place] [facility] Alfredia Ferguson, PA-C   4 weeks ago Spinal stenosis of lumbar region with neurogenic claudication   [facility] Merita Norton T, FNP   2 months ago Self-care deficit for feeding, bathing, and toileting    [facility] Merita Norton T, FNP   7 months ago Morbid obesity due to excess calories [facility])   [facility] Jacky Kindle, FNP   1 year ago Annual physical exam   [facility] Jacky Kindle, FNP

## 2022-12-30 NOTE — Telephone Encounter (Signed)
Requested Prescriptions  Refused Prescriptions Disp Refills   hydrALAZINE (APRESOLINE) 10 MG tablet [Pharmacy Med Name: HYDRALAZINE 10 MG TABLET] 90 tablet 5    Sig: TAKE 1 TABLET BY MOUTH THREE TIMES A DAY     Cardiovascular:  Vasodilators Failed - 12/29/2022  2:20 AM      Failed - HCT in normal range and within 360 days    HCT  Date Value Ref Range Status  10/30/2022 28.8 (L) 36.0 - 46.0 % Final   Hematocrit  Date Value Ref Range Status  06/02/2022 37.0 34.0 - 46.6 % Final         Failed - HGB in normal range and within 360 days    Hemoglobin  Date Value Ref Range Status  10/30/2022 9.1 (L) 12.0 - 15.0 g/dL Final  16/06/9603 54.0 11.1 - 15.9 g/dL Final         Failed - RBC in normal range and within 360 days    RBC  Date Value Ref Range Status  10/30/2022 3.17 (L) 3.87 - 5.11 MIL/uL Final         Failed - WBC in normal range and within 360 days    WBC  Date Value Ref Range Status  10/30/2022 10.9 (H) 4.0 - 10.5 K/uL Final         Failed - ANA Screen, Ifa, Serum in normal range and within 360 days    No results found for: "ANA", "ANATITER", "LABANTI"       Passed - PLT in normal range and within 360 days    Platelets  Date Value Ref Range Status  10/30/2022 335 150 - 400 K/uL Final  06/02/2022 259 150 - 450 x10E3/uL Final         Passed - Last BP in normal range    BP Readings from Last 1 Encounters:  12/18/22 118/62         Passed - Valid encounter within last 12 months    Recent Outpatient Visits           1 week ago Dizziness   [facility] Alfredia Ferguson, PA-C   4 weeks ago Spinal stenosis of lumbar region with neurogenic claudication   [facility] Jacky Kindle, FNP   2 months ago Self-care deficit for feeding, bathing, and toileting   [facility] Merita Norton T, FNP   7 months ago Morbid obesity due to excess calories [facility])   [facility]  Jacky Kindle, FNP   1 year ago Annual physical exam   [facility] Jacky Kindle, FNP

## 2023-01-01 ENCOUNTER — Encounter: Payer: Self-pay | Admitting: Family Medicine

## 2023-01-01 ENCOUNTER — Other Ambulatory Visit: Payer: Self-pay | Admitting: Family Medicine

## 2023-01-01 ENCOUNTER — Other Ambulatory Visit: Payer: Self-pay | Admitting: Physician Assistant

## 2023-01-01 DIAGNOSIS — E1159 Type 2 diabetes mellitus with other circulatory complications: Secondary | ICD-10-CM

## 2023-01-01 DIAGNOSIS — N1832 Chronic kidney disease, stage 3b: Secondary | ICD-10-CM

## 2023-01-01 DIAGNOSIS — M48062 Spinal stenosis, lumbar region with neurogenic claudication: Secondary | ICD-10-CM

## 2023-01-01 MED ORDER — HYDROCODONE-ACETAMINOPHEN 7.5-325 MG PO TABS
1.0000 | ORAL_TABLET | Freq: Four times a day (QID) | ORAL | 0 refills | Status: DC | PRN
Start: 2023-01-01 — End: 2023-01-05

## 2023-01-01 NOTE — Telephone Encounter (Signed)
Patient called. Patient informed that she no longer needs to take losartan HCTZ. Patient reported that she has a bottle of losartan  and agreed that she will start the new bottle. Patient also reported that her BP has improved per her therapist.

## 2023-01-03 ENCOUNTER — Encounter: Payer: Self-pay | Admitting: Family Medicine

## 2023-01-03 ENCOUNTER — Other Ambulatory Visit: Payer: Self-pay | Admitting: Family Medicine

## 2023-01-03 DIAGNOSIS — M48062 Spinal stenosis, lumbar region with neurogenic claudication: Secondary | ICD-10-CM

## 2023-01-04 ENCOUNTER — Encounter: Payer: Self-pay | Admitting: Family Medicine

## 2023-01-05 ENCOUNTER — Other Ambulatory Visit: Payer: Self-pay | Admitting: Family Medicine

## 2023-01-05 ENCOUNTER — Telehealth: Payer: Self-pay | Admitting: Family Medicine

## 2023-01-05 ENCOUNTER — Telehealth: Payer: Self-pay

## 2023-01-05 DIAGNOSIS — R7303 Prediabetes: Secondary | ICD-10-CM

## 2023-01-05 DIAGNOSIS — M48062 Spinal stenosis, lumbar region with neurogenic claudication: Secondary | ICD-10-CM

## 2023-01-05 DIAGNOSIS — N184 Chronic kidney disease, stage 4 (severe): Secondary | ICD-10-CM

## 2023-01-05 DIAGNOSIS — I10 Essential (primary) hypertension: Secondary | ICD-10-CM

## 2023-01-05 DIAGNOSIS — R809 Proteinuria, unspecified: Secondary | ICD-10-CM | POA: Insufficient documentation

## 2023-01-05 MED ORDER — TIRZEPATIDE-WEIGHT MANAGEMENT 7.5 MG/0.5ML ~~LOC~~ SOAJ: 7.5000 mg | SUBCUTANEOUS | 0 refills | Status: DC

## 2023-01-05 MED ORDER — HYDRALAZINE HCL 50 MG PO TABS
25.0000 mg | ORAL_TABLET | Freq: Three times a day (TID) | ORAL | 3 refills | Status: DC
Start: 2023-01-05 — End: 2023-01-06

## 2023-01-05 MED ORDER — GABAPENTIN 300 MG PO CAPS: 600.0000 mg | ORAL_CAPSULE | Freq: Three times a day (TID) | ORAL | 11 refills | Status: DC

## 2023-01-05 MED ORDER — HYDROCODONE-ACETAMINOPHEN 7.5-325 MG PO TABS
1.0000 | ORAL_TABLET | Freq: Four times a day (QID) | ORAL | 0 refills | Status: DC | PRN
Start: 2023-01-05 — End: 2023-01-06

## 2023-01-05 NOTE — Telephone Encounter (Signed)
Medication Refill - Medication: gabapentin (NEURONTIN) 300 MG capsule [[id number]]   HYDROcodone-acetaminophen (NORCO) 7.5-325 MG tablet [[id number]] - Sent to the wrong pharmacy   Has the patient contacted their pharmacy? Yes.   (Agent: If no, request that the patient contact the pharmacy for the refill. If patient does not wish to contact the pharmacy document the reason why and proceed with request.) (Agent: If yes, when and what did the pharmacy advise?)  Preferred Pharmacy (with phone number or street name):  [id number] [place] [address] Phone: [phone]  Fax: [phone]     Has the patient been seen for an appointment in the last year OR does the patient have an upcoming appointment? Yes.    Agent: Please be advised that RX refills may take up to 3 business days. We ask that you follow-up with your pharmacy.

## 2023-01-05 NOTE — Telephone Encounter (Signed)
Pt is calling to let [facility] nurse know that a PA is needed for the tirzepatide [facility]) 7.5 MG/0.5ML Pen [[id number]]  Please CB- [phone]

## 2023-01-05 NOTE — Telephone Encounter (Signed)
Copied from CRM [id number]. Topic: General - Inquiry >> Jan 05, 2023 12:41 PM Marlow Baars wrote: Reason for CRM: Surabhi with [employer] is faxing over a request for additional information that is necessary regarding a prior authorization for the patient to receive her Mounjaro. Please assist patient further by faxing over information to the fax number listed on the request.

## 2023-01-05 NOTE — Telephone Encounter (Signed)
Patient called, left VM to return the call to let us know where she wants the Gabapentin sent to. [facility] 604 VWU/98 refills.

## 2023-01-06 ENCOUNTER — Other Ambulatory Visit: Payer: Self-pay

## 2023-01-06 ENCOUNTER — Other Ambulatory Visit: Payer: Self-pay | Admitting: Family Medicine

## 2023-01-06 ENCOUNTER — Telehealth: Payer: Self-pay | Admitting: Family Medicine

## 2023-01-06 DIAGNOSIS — I10 Essential (primary) hypertension: Secondary | ICD-10-CM

## 2023-01-06 DIAGNOSIS — M48062 Spinal stenosis, lumbar region with neurogenic claudication: Secondary | ICD-10-CM

## 2023-01-06 MED ORDER — HYDRALAZINE HCL 50 MG PO TABS
25.0000 mg | ORAL_TABLET | Freq: Three times a day (TID) | ORAL | 3 refills | Status: DC
Start: 2023-01-06 — End: 2023-05-13

## 2023-01-06 MED ORDER — HYDROCODONE-ACETAMINOPHEN 7.5-325 MG PO TABS
1.0000 | ORAL_TABLET | Freq: Four times a day (QID) | ORAL | 0 refills | Status: DC | PRN
Start: 2023-01-06 — End: 2023-01-26

## 2023-01-06 NOTE — Telephone Encounter (Signed)
Requested medications are due for refill today.  no  Requested medications are on the active medications list.  yes  Last refill. 01/05/2023  Future visit scheduled.   no  Notes to clinic.  Pharmacy comment: Alternative Requested:CO PAY OVER 3000.     Requested Prescriptions  Pending Prescriptions Disp Refills   ZEPBOUND 7.5 MG/0.5ML Pen [Pharmacy Med Name: ZEPBOUND 7.5 MG/0.5 ML PEN]  0    Sig: INJECT 7.5 MG SUBCUTANEOUSLY WEEKLY     Off-Protocol Failed - 01/05/2023  2:06 PM      Failed - Medication not assigned to a protocol, review manually.      Passed - Valid encounter within last 12 months    Recent Outpatient Visits           2 weeks ago Dizziness   [place] [facility] Alfredia Ferguson, PA-C   1 month ago Spinal stenosis of lumbar region with neurogenic claudication   [facility] Merita Norton T, FNP   2 months ago Self-care deficit for feeding, bathing, and toileting   [facility] Merita Norton T, FNP   7 months ago Morbid obesity due to excess calories [facility])   [facility] Jacky Kindle, FNP   1 year ago Annual physical exam   [facility] Jacky Kindle, FNP

## 2023-01-06 NOTE — Telephone Encounter (Signed)
Pt is calling in because she says [facility] is too far and [facility] [address] [facility] [place]. Pt says she did not request for it to [facility] to [facility]. Pt is also requesting someone give her a call back by EOD if possible. Please advise.

## 2023-01-07 ENCOUNTER — Telehealth: Payer: Self-pay

## 2023-01-07 ENCOUNTER — Encounter: Payer: Self-pay | Admitting: Family Medicine

## 2023-01-07 NOTE — Telephone Encounter (Signed)
Copied from CRM [id number]. Topic: General - Other >> Jan 07, 2023  8:13 AM Carrielelia G wrote: Reason for CRM:  Sarah w/ [employer]  will need more medical information in regards to  the getting authorization for the [facility] request  (A1C or Fasting plasma Glucose results needed) not time sensitive

## 2023-01-07 NOTE — Telephone Encounter (Signed)
Patient is going to double check which medicine she was talking about and send a mychart message.  It appears it was Gabapentin but she thought it was the Hydrocodone.

## 2023-01-12 ENCOUNTER — Telehealth: Payer: Self-pay

## 2023-01-12 NOTE — Telephone Encounter (Signed)
Patient called. Patient aware of PA approval.

## 2023-01-15 DIAGNOSIS — F339 Major depressive disorder, recurrent, unspecified: Secondary | ICD-10-CM | POA: Diagnosis not present

## 2023-01-15 DIAGNOSIS — Z6841 Body Mass Index (BMI) 40.0 and over, adult: Secondary | ICD-10-CM | POA: Diagnosis not present

## 2023-01-15 DIAGNOSIS — F411 Generalized anxiety disorder: Secondary | ICD-10-CM | POA: Diagnosis not present

## 2023-01-15 DIAGNOSIS — G4733 Obstructive sleep apnea (adult) (pediatric): Secondary | ICD-10-CM | POA: Diagnosis not present

## 2023-01-15 DIAGNOSIS — N184 Chronic kidney disease, stage 4 (severe): Secondary | ICD-10-CM | POA: Diagnosis not present

## 2023-01-15 DIAGNOSIS — D631 Anemia in chronic kidney disease: Secondary | ICD-10-CM | POA: Diagnosis not present

## 2023-01-15 DIAGNOSIS — E785 Hyperlipidemia, unspecified: Secondary | ICD-10-CM | POA: Diagnosis not present

## 2023-01-15 DIAGNOSIS — E669 Obesity, unspecified: Secondary | ICD-10-CM | POA: Diagnosis not present

## 2023-01-15 DIAGNOSIS — M48062 Spinal stenosis, lumbar region with neurogenic claudication: Secondary | ICD-10-CM | POA: Diagnosis not present

## 2023-01-15 DIAGNOSIS — M6281 Muscle weakness (generalized): Secondary | ICD-10-CM | POA: Diagnosis not present

## 2023-01-15 DIAGNOSIS — R262 Difficulty in walking, not elsewhere classified: Secondary | ICD-10-CM | POA: Diagnosis not present

## 2023-01-15 DIAGNOSIS — G894 Chronic pain syndrome: Secondary | ICD-10-CM | POA: Diagnosis not present

## 2023-01-15 DIAGNOSIS — K648 Other hemorrhoids: Secondary | ICD-10-CM | POA: Diagnosis not present

## 2023-01-15 DIAGNOSIS — Z9181 History of falling: Secondary | ICD-10-CM | POA: Diagnosis not present

## 2023-01-23 ENCOUNTER — Other Ambulatory Visit: Payer: Self-pay | Admitting: Family Medicine

## 2023-01-23 DIAGNOSIS — I10 Essential (primary) hypertension: Secondary | ICD-10-CM

## 2023-01-23 NOTE — Telephone Encounter (Signed)
Dc'd by Merita Norton  FNP 10/30/22 dose changed  Requested Prescriptions  Refused Prescriptions Disp Refills   hydrALAZINE (APRESOLINE) 10 MG tablet [Pharmacy Med Name: HYDRALAZINE 10 MG TABLET] 90 tablet 5    Sig: TAKE 1 TABLET BY MOUTH THREE TIMES A DAY     Cardiovascular:  Vasodilators Failed - 01/23/2023  2:34 AM      Failed - HCT in normal range and within 360 days    HCT  Date Value Ref Range Status  10/30/2022 28.8 (L) 36.0 - 46.0 % Final   Hematocrit  Date Value Ref Range Status  06/02/2022 37.0 34.0 - 46.6 % Final         Failed - HGB in normal range and within 360 days    Hemoglobin  Date Value Ref Range Status  10/30/2022 9.1 (L) 12.0 - 15.0 g/dL Final  84/69/6295 28.4 11.1 - 15.9 g/dL Final         Failed - RBC in normal range and within 360 days    RBC  Date Value Ref Range Status  10/30/2022 3.17 (L) 3.87 - 5.11 MIL/uL Final         Failed - WBC in normal range and within 360 days    WBC  Date Value Ref Range Status  10/30/2022 10.9 (H) 4.0 - 10.5 K/uL Final         Failed - ANA Screen, Ifa, Serum in normal range and within 360 days    No results found for: "ANA", "ANATITER", "LABANTI"       Passed - PLT in normal range and within 360 days    Platelets  Date Value Ref Range Status  10/30/2022 335 150 - 400 K/uL Final  06/02/2022 259 150 - 450 x10E3/uL Final         Passed - Last BP in normal range    BP Readings from Last 1 Encounters:  12/18/22 118/62         Passed - Valid encounter within last 12 months    Recent Outpatient Visits           1 month ago Dizziness   [facility] Alfredia Ferguson, PA-C   1 month ago Spinal stenosis of lumbar region with neurogenic claudication   [facility] Jacky Kindle, FNP   3 months ago Self-care deficit for feeding, bathing, and toileting   [facility] Merita Norton T, FNP   7 months ago Morbid obesity due to excess calories  [facility])   [facility] Jacky Kindle, FNP   1 year ago Annual physical exam   [facility] Jacky Kindle, FNP

## 2023-01-25 ENCOUNTER — Other Ambulatory Visit: Payer: Self-pay | Admitting: Family Medicine

## 2023-01-25 DIAGNOSIS — F419 Anxiety disorder, unspecified: Secondary | ICD-10-CM

## 2023-01-26 ENCOUNTER — Other Ambulatory Visit: Payer: Self-pay | Admitting: Family Medicine

## 2023-01-26 DIAGNOSIS — M48062 Spinal stenosis, lumbar region with neurogenic claudication: Secondary | ICD-10-CM

## 2023-01-26 MED ORDER — HYDROCODONE-ACETAMINOPHEN 7.5-325 MG PO TABS
1.0000 | ORAL_TABLET | Freq: Four times a day (QID) | ORAL | 0 refills | Status: DC | PRN
Start: 2023-01-26 — End: 2023-03-27

## 2023-01-26 NOTE — Telephone Encounter (Signed)
Please advise 

## 2023-02-09 ENCOUNTER — Other Ambulatory Visit: Payer: Self-pay | Admitting: Family Medicine

## 2023-02-09 ENCOUNTER — Encounter: Payer: Self-pay | Admitting: Family Medicine

## 2023-02-09 DIAGNOSIS — M48062 Spinal stenosis, lumbar region with neurogenic claudication: Secondary | ICD-10-CM

## 2023-02-10 ENCOUNTER — Other Ambulatory Visit: Payer: Self-pay | Admitting: Family Medicine

## 2023-02-10 ENCOUNTER — Other Ambulatory Visit: Payer: Self-pay

## 2023-02-10 DIAGNOSIS — M48062 Spinal stenosis, lumbar region with neurogenic claudication: Secondary | ICD-10-CM

## 2023-02-10 DIAGNOSIS — I10 Essential (primary) hypertension: Secondary | ICD-10-CM

## 2023-02-10 MED ORDER — METOPROLOL SUCCINATE ER 25 MG PO TB24
ORAL_TABLET | ORAL | 0 refills | Status: DC
Start: 2023-02-10 — End: 2023-05-13

## 2023-02-11 ENCOUNTER — Other Ambulatory Visit: Payer: Self-pay | Admitting: Family Medicine

## 2023-02-11 MED ORDER — LOSARTAN POTASSIUM 100 MG PO TABS: 100.0000 mg | ORAL_TABLET | Freq: Every day | ORAL | 3 refills | Status: DC

## 2023-02-15 DIAGNOSIS — M6281 Muscle weakness (generalized): Secondary | ICD-10-CM | POA: Diagnosis not present

## 2023-02-15 DIAGNOSIS — Z6841 Body Mass Index (BMI) 40.0 and over, adult: Secondary | ICD-10-CM | POA: Diagnosis not present

## 2023-02-15 DIAGNOSIS — M48062 Spinal stenosis, lumbar region with neurogenic claudication: Secondary | ICD-10-CM | POA: Diagnosis not present

## 2023-02-15 DIAGNOSIS — R262 Difficulty in walking, not elsewhere classified: Secondary | ICD-10-CM | POA: Diagnosis not present

## 2023-03-03 ENCOUNTER — Encounter: Payer: Self-pay | Admitting: Family Medicine

## 2023-03-05 ENCOUNTER — Encounter: Payer: Self-pay | Admitting: Family Medicine

## 2023-03-08 ENCOUNTER — Other Ambulatory Visit: Payer: Self-pay | Admitting: Family Medicine

## 2023-03-08 MED ORDER — PROCHLORPERAZINE MALEATE 10 MG PO TABS: 10.0000 mg | ORAL_TABLET | Freq: Four times a day (QID) | ORAL | 0 refills | Status: AC | PRN

## 2023-03-09 ENCOUNTER — Ambulatory Visit: Payer: Self-pay

## 2023-03-09 ENCOUNTER — Encounter: Payer: Self-pay | Admitting: Family Medicine

## 2023-03-09 NOTE — Patient Outreach (Signed)
  Care Coordination   Initial Visit Note   03/09/2023 Name: Ashley Mccullough MRN: [id number] DOB: 05/30/1961  Ashley Mccullough is a 62 y.o. year old female who sees Jacky Kindle, FNP for primary care. I spoke with  Berneice Heinrich by phone today.  What matters to the patients health and wellness today?  Patient reports she is interested in establishing an in home medical provider.  Patient is house bound and her last visit with Dr. Suzie Portela was via video.  Patient has questions about medication and is recommended to contact the provider to clarify.      Goals Addressed   None     SDOH assessments and interventions completed:  Yes    Interventions Today    Flowsheet Row Most Recent Value  General Interventions   General Interventions Discussed/Reviewed General Interventions Discussed  [Interested in home medical provider, recommended to contact insurance company for coverage and in network provider]       Care Coordination Interventions:  Yes, provided   -SW directed patient to contact provider with questions regarding medication -SW directed patient to contact insurance provider on coverage for in home medical provider  Follow up plan: No further intervention required.   Encounter Outcome:  Pt. Refused

## 2023-03-09 NOTE — Patient Outreach (Signed)
  Care Coordination   03/09/2023 Name: Ashley Mccullough MRN: [id number] DOB: Aug 29, 1961   Care Coordination Outreach Attempts:  An unsuccessful telephone outreach was attempted today to offer the patient information about available care coordination services.  Follow Up Plan:  Additional outreach attempts will be made to offer the patient care coordination information and services.   Encounter Outcome:  No Answer   Care Coordination Interventions:  No, not indicated    SIG Lysle Morales, BSW Social Worker [facility] Care Management  [phone]

## 2023-03-17 DIAGNOSIS — M48062 Spinal stenosis, lumbar region with neurogenic claudication: Secondary | ICD-10-CM | POA: Diagnosis not present

## 2023-03-17 DIAGNOSIS — Z6841 Body Mass Index (BMI) 40.0 and over, adult: Secondary | ICD-10-CM | POA: Diagnosis not present

## 2023-03-17 DIAGNOSIS — R262 Difficulty in walking, not elsewhere classified: Secondary | ICD-10-CM | POA: Diagnosis not present

## 2023-03-17 DIAGNOSIS — M6281 Muscle weakness (generalized): Secondary | ICD-10-CM | POA: Diagnosis not present

## 2023-03-27 ENCOUNTER — Other Ambulatory Visit: Payer: Self-pay | Admitting: Family Medicine

## 2023-03-27 ENCOUNTER — Telehealth: Payer: Self-pay

## 2023-03-27 DIAGNOSIS — M48062 Spinal stenosis, lumbar region with neurogenic claudication: Secondary | ICD-10-CM

## 2023-03-27 MED ORDER — HYDROCODONE-ACETAMINOPHEN 7.5-325 MG PO TABS
1.0000 | ORAL_TABLET | Freq: Four times a day (QID) | ORAL | 0 refills | Status: DC | PRN
Start: 2023-03-27 — End: 2023-05-08

## 2023-04-03 ENCOUNTER — Telehealth: Payer: Self-pay

## 2023-04-17 DIAGNOSIS — M6281 Muscle weakness (generalized): Secondary | ICD-10-CM | POA: Diagnosis not present

## 2023-04-17 DIAGNOSIS — Z6841 Body Mass Index (BMI) 40.0 and over, adult: Secondary | ICD-10-CM | POA: Diagnosis not present

## 2023-04-17 DIAGNOSIS — M48062 Spinal stenosis, lumbar region with neurogenic claudication: Secondary | ICD-10-CM | POA: Diagnosis not present

## 2023-04-17 DIAGNOSIS — R262 Difficulty in walking, not elsewhere classified: Secondary | ICD-10-CM | POA: Diagnosis not present

## 2023-04-27 ENCOUNTER — Other Ambulatory Visit: Payer: Self-pay | Admitting: Family Medicine

## 2023-04-28 NOTE — Telephone Encounter (Signed)
Requested medications are due for refill today.  unsure  Requested medications are on the active medications list.  no  Last refill. 01/05/2023 different dosage  Future visit scheduled.   no  Notes to clinic.  Please review for refill. Dosage is different.    Requested Prescriptions  Pending Prescriptions Disp Refills   MOUNJARO 5 MG/0.5ML Pen [Pharmacy Med Name: MOUNJARO 5 MG/0.5 ML PEN]  2    Sig: INJECT 5 MG SUBCUTANEOUSLY WEEKLY     Off-Protocol Failed - 04/27/2023  2:32 AM      Failed - Medication not assigned to a protocol, review manually.      Passed - Valid encounter within last 12 months    Recent Outpatient Visits           4 months ago Dizziness   [facility] Alfredia Ferguson, PA-C   4 months ago Spinal stenosis of lumbar region with neurogenic claudication   [facility] Merita Norton T, FNP   6 months ago Self-care deficit for feeding, bathing, and toileting   [facility] Merita Norton T, FNP   11 months ago Morbid obesity due to excess calories [facility])   [facility] Jacky Kindle, FNP   1 year ago Annual physical exam   [facility] Jacky Kindle, FNP

## 2023-04-29 ENCOUNTER — Other Ambulatory Visit: Payer: Self-pay | Admitting: Family Medicine

## 2023-04-29 DIAGNOSIS — F330 Major depressive disorder, recurrent, mild: Secondary | ICD-10-CM | POA: Diagnosis not present

## 2023-04-29 DIAGNOSIS — N184 Chronic kidney disease, stage 4 (severe): Secondary | ICD-10-CM | POA: Diagnosis not present

## 2023-04-29 DIAGNOSIS — E559 Vitamin D deficiency, unspecified: Secondary | ICD-10-CM | POA: Diagnosis not present

## 2023-04-29 DIAGNOSIS — G43909 Migraine, unspecified, not intractable, without status migrainosus: Secondary | ICD-10-CM | POA: Diagnosis not present

## 2023-04-29 DIAGNOSIS — Z6841 Body Mass Index (BMI) 40.0 and over, adult: Secondary | ICD-10-CM | POA: Diagnosis not present

## 2023-04-29 DIAGNOSIS — F1190 Opioid use, unspecified, uncomplicated: Secondary | ICD-10-CM | POA: Diagnosis not present

## 2023-04-29 DIAGNOSIS — E1142 Type 2 diabetes mellitus with diabetic polyneuropathy: Secondary | ICD-10-CM | POA: Diagnosis not present

## 2023-04-29 DIAGNOSIS — E1122 Type 2 diabetes mellitus with diabetic chronic kidney disease: Secondary | ICD-10-CM | POA: Diagnosis not present

## 2023-04-29 DIAGNOSIS — F419 Anxiety disorder, unspecified: Secondary | ICD-10-CM | POA: Diagnosis not present

## 2023-04-29 DIAGNOSIS — I509 Heart failure, unspecified: Secondary | ICD-10-CM | POA: Diagnosis not present

## 2023-04-29 DIAGNOSIS — I130 Hypertensive heart and chronic kidney disease with heart failure and stage 1 through stage 4 chronic kidney disease, or unspecified chronic kidney disease: Secondary | ICD-10-CM | POA: Diagnosis not present

## 2023-04-29 MED ORDER — TIRZEPATIDE 5 MG/0.5ML ~~LOC~~ SOAJ: 5.0000 mg | SUBCUTANEOUS | 0 refills | Status: AC

## 2023-05-01 ENCOUNTER — Other Ambulatory Visit: Payer: Self-pay | Admitting: Family Medicine

## 2023-05-01 DIAGNOSIS — F419 Anxiety disorder, unspecified: Secondary | ICD-10-CM

## 2023-05-08 ENCOUNTER — Other Ambulatory Visit: Payer: Self-pay | Admitting: Family Medicine

## 2023-05-08 DIAGNOSIS — M48062 Spinal stenosis, lumbar region with neurogenic claudication: Secondary | ICD-10-CM

## 2023-05-08 MED ORDER — HYDROCODONE-ACETAMINOPHEN 7.5-325 MG PO TABS
1.0000 | ORAL_TABLET | Freq: Four times a day (QID) | ORAL | 0 refills | Status: DC | PRN
Start: 2023-05-08 — End: 2023-05-28

## 2023-05-10 ENCOUNTER — Other Ambulatory Visit: Payer: Self-pay | Admitting: Family Medicine

## 2023-05-10 DIAGNOSIS — I10 Essential (primary) hypertension: Secondary | ICD-10-CM

## 2023-05-13 ENCOUNTER — Encounter: Payer: Self-pay | Admitting: *Deleted

## 2023-05-13 ENCOUNTER — Other Ambulatory Visit: Payer: Self-pay | Admitting: Family Medicine

## 2023-05-13 ENCOUNTER — Telehealth (INDEPENDENT_AMBULATORY_CARE_PROVIDER_SITE_OTHER): Payer: HMO | Admitting: Family Medicine

## 2023-05-13 ENCOUNTER — Encounter: Payer: Self-pay | Admitting: Family Medicine

## 2023-05-13 VITALS — BP 135/77 | HR 74

## 2023-05-13 DIAGNOSIS — Z789 Other specified health status: Secondary | ICD-10-CM | POA: Insufficient documentation

## 2023-05-13 DIAGNOSIS — G8929 Other chronic pain: Secondary | ICD-10-CM | POA: Diagnosis not present

## 2023-05-13 DIAGNOSIS — F339 Major depressive disorder, recurrent, unspecified: Secondary | ICD-10-CM

## 2023-05-13 DIAGNOSIS — I129 Hypertensive chronic kidney disease with stage 1 through stage 4 chronic kidney disease, or unspecified chronic kidney disease: Secondary | ICD-10-CM | POA: Diagnosis not present

## 2023-05-13 DIAGNOSIS — Z741 Need for assistance with personal care: Secondary | ICD-10-CM

## 2023-05-13 DIAGNOSIS — F419 Anxiety disorder, unspecified: Secondary | ICD-10-CM | POA: Diagnosis not present

## 2023-05-13 DIAGNOSIS — Z7409 Other reduced mobility: Secondary | ICD-10-CM | POA: Diagnosis not present

## 2023-05-13 DIAGNOSIS — N184 Chronic kidney disease, stage 4 (severe): Secondary | ICD-10-CM | POA: Diagnosis not present

## 2023-05-13 MED ORDER — HYDRALAZINE HCL 50 MG PO TABS
50.0000 mg | ORAL_TABLET | Freq: Three times a day (TID) | ORAL | 3 refills | Status: DC
Start: 2023-05-13 — End: 2023-07-16

## 2023-05-13 MED ORDER — METOPROLOL SUCCINATE ER 25 MG PO TB24
ORAL_TABLET | ORAL | 0 refills | Status: DC
Start: 2023-05-13 — End: 2023-07-16

## 2023-05-13 MED ORDER — HYDROXYZINE PAMOATE 25 MG PO CAPS
25.0000 mg | ORAL_CAPSULE | Freq: Every evening | ORAL | Status: AC | PRN
Start: 2023-05-13 — End: ?

## 2023-05-13 MED ORDER — BUPROPION HCL ER (XL) 150 MG PO TB24
150.0000 mg | ORAL_TABLET | Freq: Every day | ORAL | 0 refills | Status: DC
Start: 2023-05-13 — End: 2023-07-20

## 2023-05-13 NOTE — Assessment & Plan Note (Signed)
Chronic, worsening Unable to partake in ADLs notes that she spends most of the day laying in bed due to pain Recommend assistance of home health services and coordination for home bound patients; pt agreeable knows that she needs labs for kidney monitoring

## 2023-05-13 NOTE — Assessment & Plan Note (Signed)
Chronic; BP appears stable; however, unclear where CKD is given pt is unable to get in for labs/urine for monitoring Working on additional resources to assist; consult with [facility] to further assist patient either with PACE or something similar

## 2023-05-13 NOTE — Assessment & Plan Note (Signed)
Chronic, reports feeling "numb" Encouraged to scale back on hydroxyzine from BID (reported) to at bedtime to assist; from there, plan to eliminate wellbutrin 150 and can scale back on SSRI at later date.  Encourage 6-8 week f/u

## 2023-05-13 NOTE — Patient Outreach (Signed)
  Care Coordination   Collaboration  Visit Note   05/13/2023 Name: Ashley Mccullough MRN: [id number] DOB: 28-Aug-1961  Ashley Mccullough is a 62 y.o. year old female who sees Jacky Kindle, FNP for primary care. I  communicated with patient's provider and RN regarding available options for in home care  What matters to the patients health and wellness today?  Ongoing medical care, in home care options    Goals Addressed             This Visit's Progress    Care coordination activities       Interventions Today    Flowsheet Row Most Recent Value  Chronic Disease   Chronic disease during today's visit Chronic Kidney Disease/End Stage Renal Disease (ESRD), Other  [ambulatory limitations]  General Interventions   General Interventions Discussed/Reviewed Communication with, Level of Care  Communication with RN, PCP/Specialists  [discussed patient's ambulatory limitations and available options for care ie PACE, [facility] CSW to remain available to assist with referral to [facility] (medical care in the home) if indicated]  Level of Care Personal Care Services  [confirmed that patient is now paying out of pocket for in home care, however funds are now limited. RN agreeable to contacting patient to discuss available options]              SDOH assessments and interventions completed:  No     Care Coordination Interventions:  Yes, provided   Follow up plan:  To be determined following provider visit and contact with RN    Encounter Outcome:  Pt. Visit Completed

## 2023-05-13 NOTE — Progress Notes (Signed)
MyChart Video Visit  Virtual Visit via Video Note   This format is felt to be most appropriate for this patient at this time. Physical exam was limited by quality of the video and audio technology used for the visit.   Patient location: Home (lying in bed) Provider location:  [facility] [address]  [address] [place], [place] [address]  I discussed the limitations of evaluation and management by telemedicine and the availability of in person appointments. The patient expressed understanding and agreed to proceed.  Patient: Berneice Heinrich   DOB: 08/20/1961   62 y.o. Female  MRN: [id number] Visit Date: 05/13/2023  Today's healthcare provider: Jacky Kindle, FNP  Re Introduced to nurse practitioner role and practice setting.  All questions answered.  Discussed provider/patient relationship and expectations.  Chief Complaint  Patient presents with   Follow-up   Subjective    HPI    Medications: Outpatient Medications Prior to Visit  Medication Sig Note   acetaminophen (TYLENOL) 650 MG CR tablet Take 650-1,300 mg by mouth at bedtime. As needed for arthritis.    Cetirizine HCl 10 MG CAPS Take 1 capsule by mouth daily.    COLLAGEN PO Take by mouth.    FLUoxetine (PROZAC) 20 MG capsule Take 3 capsules (60 mg total) by mouth daily. Take 3 capsules daily.    gabapentin (NEURONTIN) 300 MG capsule Take 2 capsules (600 mg total) by mouth 3 (three) times daily.    HYDROcodone-acetaminophen (NORCO) 7.5-325 MG tablet Take 1-2 tablets by mouth every 6 (six) hours as needed for severe pain.    losartan (COZAAR) 100 MG tablet Take 1 tablet (100 mg total) by mouth daily.    montelukast (SINGULAIR) 10 MG tablet Take 1 tablet (10 mg total) by mouth daily.    polyethylene glycol (MIRALAX / GLYCOLAX) 17 g packet Take 17 g by mouth 2 (two) times daily.    prochlorperazine (COMPAZINE) 10 MG tablet Take 1 tablet (10 mg total) by mouth every 6 (six) hours as needed for nausea  or vomiting.    tirzepatide (MOUNJARO) 5 MG/0.5ML Pen Inject 5 mg into the skin once a week.    [DISCONTINUED] buPROPion (WELLBUTRIN XL) 150 MG 24 hr tablet TAKE 1 TABLET BY MOUTH EVERY DAY 05/13/2023: pt request   [DISCONTINUED] hydrALAZINE (APRESOLINE) 50 MG tablet Take 0.5 tablets (25 mg total) by mouth 3 (three) times daily. Increased from 10 mg. (Patient taking differently: Take 50 mg by mouth 3 (three) times daily. Increased from 10 mg.)    [DISCONTINUED] hydrOXYzine (VISTARIL) 25 MG capsule TAKE 1 CAPSULE (25 MG TOTAL) BY MOUTH EVERY 8 (EIGHT) HOURS AS NEEDED.    [DISCONTINUED] metoprolol succinate (TOPROL-XL) 25 MG 24 hr tablet TAKE 1 TABLET BY MOUTH DAILY    Blood Glucose Monitoring Suppl DEVI 1 each by Does not apply route in the morning, at noon, and at bedtime. May substitute to any manufacturer covered by patient's insurance.    Blood Pressure Monitoring (CARETOUCH BP ARM MONITOR) DEVI Use to monitor bp twice daily    calcitRIOL (ROCALTROL) 0.25 MCG capsule TAKE 1 CAPSULE BY MOUTH 1 TIME EACH DAY    hydrocortisone (ANUSOL-HC) 25 MG suppository Place 25 mg rectally 2 (two) times daily as needed. (Patient not taking: Reported on 05/13/2023)    traZODone (DESYREL) 50 MG tablet Take 50 mg by mouth at bedtime as needed for sleep. (Patient not taking: Reported on 05/13/2023)    No facility-administered medications prior to visit.  Objective    BP 135/77   Pulse 74   Physical Exam Constitutional:      Appearance: She is obese.  Pulmonary:     Effort: Pulmonary effort is normal.  Neurological:     General: No focal deficit present.     Mental Status: She is alert and oriented to person, place, and time. Mental status is at baseline.  Psychiatric:        Mood and Affect: Mood normal.        Behavior: Behavior normal.        Thought Content: Thought content normal.        Judgment: Judgment normal.      Assessment & Plan     Problem List Items Addressed This Visit        Genitourinary   Hypertensive kidney disease with stage 4 chronic kidney disease (HCC) - Primary    Chronic; BP appears stable; however, unclear where CKD is given pt is unable to get in for labs/urine for monitoring Working on additional resources to assist; consult with [facility] [employer] [facility] to further assist patient either with PACE or something similar       Relevant Medications   hydrALAZINE (APRESOLINE) 50 MG tablet   metoprolol succinate (TOPROL-XL) 25 MG 24 hr tablet     Other   Anxiety   Relevant Medications   hydrOXYzine (VISTARIL) 25 MG capsule   buPROPion (WELLBUTRIN XL) 150 MG 24 hr tablet   Depression, recurrent (HCC)    Chronic, reports feeling "numb" Encouraged to scale back on hydroxyzine from BID (reported) to at bedtime to assist; from there, plan to eliminate wellbutrin 150 and can scale back on SSRI at later date.  Encourage 6-8 week f/u       Relevant Medications   hydrOXYzine (VISTARIL) 25 MG capsule   buPROPion (WELLBUTRIN XL) 150 MG 24 hr tablet   Impaired mobility and activities of daily living    Chronic, worsening Reports use of multiple walkers, wheelchairs and use of assistive devices do not fit in all her doors She also uses a commode; which her HHA will clean 2x/week to assist       Other chronic pain    Chronic, worsening Unable to partake in ADLs notes that she spends most of the day laying in bed due to pain Recommend assistance of home health services and coordination for home bound patients; pt agreeable knows that she needs labs for kidney monitoring       Relevant Medications   buPROPion (WELLBUTRIN XL) 150 MG 24 hr tablet   Self-care deficit for feeding, bathing, and toileting    Chronic, reports assistance of self paid HHA to provide 8 hours of assistance per week including meals, cooking, cleaning, bathing, laundry etc Pt reports she is appreciative of this time and help      Return in about 6 weeks (around 06/24/2023) for anxiety and  depression.    I discussed the assessment and treatment plan with the patient. The patient was provided an opportunity to ask questions and all were answered. The patient agreed with the plan and demonstrated an understanding of the instructions.   The patient was advised to call back or seek an in-person evaluation if the symptoms worsen or if the condition fails to improve as anticipated.  I provided 20 minutes of face-to-face time during this encounter discussing her chronic conditions and need for altered services to assist with current bed bound/home bound state.  Clovis Fredrickson  Suzie Portela, FNP, have reviewed all documentation for this visit. The documentation on 05/13/23 for the exam, diagnosis, procedures, and orders are all accurate and complete.  Jacky Kindle, FNP [facility] [phone] (phone) [phone] (fax)  [facility]

## 2023-05-13 NOTE — Assessment & Plan Note (Signed)
Chronic, worsening Reports use of multiple walkers, wheelchairs and use of assistive devices do not fit in all her doors She also uses a commode; which her HHA will clean 2x/week to assist

## 2023-05-13 NOTE — Patient Instructions (Signed)
Stop hydroxyzine during the day; only take at bedtime  Stop wellbutrin daily.  Follow up appt recommended in 6-8 weeks for further assistance in mood.

## 2023-05-13 NOTE — Assessment & Plan Note (Signed)
Chronic, reports assistance of self paid HHA to provide 8 hours of assistance per week including meals, cooking, cleaning, bathing, laundry etc Pt reports she is appreciative of this time and help

## 2023-05-14 ENCOUNTER — Encounter: Payer: Self-pay | Admitting: *Deleted

## 2023-05-14 ENCOUNTER — Other Ambulatory Visit: Payer: HMO

## 2023-05-14 NOTE — Patient Outreach (Signed)
  Care Coordination   Collaboration  Visit Note   05/14/2023 Name: Ashley Mccullough MRN: [id number] DOB: 11-Nov-1960  Ashley Mccullough is a 62 y.o. year old female who sees Jacky Kindle, FNP for primary care. Discussed patient's medical condition and possible options for care.   What matters to the patients health and wellness today?  Patient agreeable to referral to  [facility], for in home medical care due to limitations in her mobility.      Goals Addressed             This Visit's Progress    Care coordination activities       Interventions Today    Flowsheet Row Most Recent Value  Chronic Disease   Chronic disease during today's visit Chronic Kidney Disease/End Stage Renal Disease (ESRD), Other  [ambulatory limitations]  General Interventions   General Interventions Discussed/Reviewed Communication with  Communication with RN  [Patient's medical status discussed with RNCM. Confirmed that patient is agreeable to referral to [facility]-in home medical care-referral form fowarded to [facility] who agrees to complete and return for processing]              SDOH assessments and interventions completed:  No     Care Coordination Interventions:  Yes, provided   Follow up plan: No further intervention required.   Encounter Outcome:  Pt. Visit Completed

## 2023-05-18 DIAGNOSIS — M6281 Muscle weakness (generalized): Secondary | ICD-10-CM | POA: Diagnosis not present

## 2023-05-18 DIAGNOSIS — M48062 Spinal stenosis, lumbar region with neurogenic claudication: Secondary | ICD-10-CM | POA: Diagnosis not present

## 2023-05-18 DIAGNOSIS — Z6841 Body Mass Index (BMI) 40.0 and over, adult: Secondary | ICD-10-CM | POA: Diagnosis not present

## 2023-05-18 DIAGNOSIS — R262 Difficulty in walking, not elsewhere classified: Secondary | ICD-10-CM | POA: Diagnosis not present

## 2023-05-19 ENCOUNTER — Other Ambulatory Visit: Payer: HMO

## 2023-05-28 ENCOUNTER — Other Ambulatory Visit: Payer: Self-pay | Admitting: Family Medicine

## 2023-05-28 DIAGNOSIS — M48062 Spinal stenosis, lumbar region with neurogenic claudication: Secondary | ICD-10-CM

## 2023-05-28 NOTE — Progress Notes (Signed)
A1c checked on 04/29/23 during patient house call. Result abstracted. See media

## 2023-05-29 ENCOUNTER — Other Ambulatory Visit: Payer: Self-pay

## 2023-05-29 ENCOUNTER — Emergency Department
Admission: EM | Admit: 2023-05-29 | Discharge: 2023-06-03 | Disposition: A | Discharge status: Skilled Nursing Facility | Payer: HMO | Attending: Emergency Medicine | Admitting: Emergency Medicine

## 2023-05-29 ENCOUNTER — Emergency Department: Payer: HMO

## 2023-05-29 DIAGNOSIS — Y92009 Unspecified place in unspecified non-institutional (private) residence as the place of occurrence of the external cause: Secondary | ICD-10-CM | POA: Insufficient documentation

## 2023-05-29 DIAGNOSIS — M5432 Sciatica, left side: Secondary | ICD-10-CM | POA: Insufficient documentation

## 2023-05-29 DIAGNOSIS — M549 Dorsalgia, unspecified: Secondary | ICD-10-CM | POA: Diagnosis not present

## 2023-05-29 DIAGNOSIS — I129 Hypertensive chronic kidney disease with stage 1 through stage 4 chronic kidney disease, or unspecified chronic kidney disease: Secondary | ICD-10-CM | POA: Insufficient documentation

## 2023-05-29 DIAGNOSIS — M5441 Lumbago with sciatica, right side: Secondary | ICD-10-CM | POA: Diagnosis not present

## 2023-05-29 DIAGNOSIS — M5136 Other intervertebral disc degeneration, lumbar region: Secondary | ICD-10-CM | POA: Diagnosis not present

## 2023-05-29 DIAGNOSIS — Z043 Encounter for examination and observation following other accident: Secondary | ICD-10-CM | POA: Diagnosis not present

## 2023-05-29 DIAGNOSIS — W19XXXA Unspecified fall, initial encounter: Secondary | ICD-10-CM | POA: Insufficient documentation

## 2023-05-29 DIAGNOSIS — S0990XA Unspecified injury of head, initial encounter: Secondary | ICD-10-CM | POA: Diagnosis not present

## 2023-05-29 DIAGNOSIS — S39012A Strain of muscle, fascia and tendon of lower back, initial encounter: Secondary | ICD-10-CM | POA: Insufficient documentation

## 2023-05-29 DIAGNOSIS — R2689 Other abnormalities of gait and mobility: Secondary | ICD-10-CM | POA: Insufficient documentation

## 2023-05-29 DIAGNOSIS — M5020 Other cervical disc displacement, unspecified cervical region: Secondary | ICD-10-CM | POA: Diagnosis not present

## 2023-05-29 DIAGNOSIS — G8929 Other chronic pain: Secondary | ICD-10-CM

## 2023-05-29 DIAGNOSIS — I10 Essential (primary) hypertension: Secondary | ICD-10-CM | POA: Diagnosis not present

## 2023-05-29 DIAGNOSIS — M47816 Spondylosis without myelopathy or radiculopathy, lumbar region: Secondary | ICD-10-CM | POA: Diagnosis not present

## 2023-05-29 DIAGNOSIS — S3992XA Unspecified injury of lower back, initial encounter: Secondary | ICD-10-CM | POA: Diagnosis present

## 2023-05-29 DIAGNOSIS — M50322 Other cervical disc degeneration at C5-C6 level: Secondary | ICD-10-CM | POA: Diagnosis not present

## 2023-05-29 DIAGNOSIS — M47812 Spondylosis without myelopathy or radiculopathy, cervical region: Secondary | ICD-10-CM | POA: Diagnosis not present

## 2023-05-29 DIAGNOSIS — M6281 Muscle weakness (generalized): Secondary | ICD-10-CM | POA: Insufficient documentation

## 2023-05-29 DIAGNOSIS — M5442 Lumbago with sciatica, left side: Secondary | ICD-10-CM | POA: Diagnosis not present

## 2023-05-29 DIAGNOSIS — M545 Low back pain, unspecified: Secondary | ICD-10-CM | POA: Diagnosis not present

## 2023-05-29 DIAGNOSIS — N189 Chronic kidney disease, unspecified: Secondary | ICD-10-CM | POA: Insufficient documentation

## 2023-05-29 DIAGNOSIS — M5431 Sciatica, right side: Secondary | ICD-10-CM | POA: Diagnosis not present

## 2023-05-29 DIAGNOSIS — M4316 Spondylolisthesis, lumbar region: Secondary | ICD-10-CM | POA: Diagnosis not present

## 2023-05-29 LAB — CBC WITH DIFFERENTIAL/PLATELET
Abs Immature Granulocytes: 0.03 10*3/uL (ref 0.00–0.07)
Basophils Absolute: 0 10*3/uL (ref 0.0–0.1)
Basophils Relative: 0 %
Eosinophils Absolute: 0.2 10*3/uL (ref 0.0–0.5)
Eosinophils Relative: 2 %
HCT: 31.8 % — ABNORMAL LOW (ref 36.0–46.0)
Hemoglobin: 10 g/dL — ABNORMAL LOW (ref 12.0–15.0)
Immature Granulocytes: 0 %
Lymphocytes Relative: 14 %
Lymphs Abs: 1.3 10*3/uL (ref 0.7–4.0)
MCH: 28.2 pg (ref 26.0–34.0)
MCHC: 31.4 g/dL (ref 30.0–36.0)
MCV: 89.6 fL (ref 80.0–100.0)
Monocytes Absolute: 0.8 10*3/uL (ref 0.1–1.0)
Monocytes Relative: 8 %
Neutro Abs: 7.2 10*3/uL (ref 1.7–7.7)
Neutrophils Relative %: 76 %
Platelets: 270 10*3/uL (ref 150–400)
RBC: 3.55 MIL/uL — ABNORMAL LOW (ref 3.87–5.11)
RDW: 15 % (ref 11.5–15.5)
WBC: 9.5 10*3/uL (ref 4.0–10.5)
nRBC: 0 % (ref 0.0–0.2)

## 2023-05-29 LAB — COMPREHENSIVE METABOLIC PANEL
ALT: 16 U/L (ref 0–44)
AST: 19 U/L (ref 15–41)
Albumin: 2.9 g/dL — ABNORMAL LOW (ref 3.5–5.0)
Alkaline Phosphatase: 49 U/L (ref 38–126)
Anion gap: 7 (ref 5–15)
BUN: 34 mg/dL — ABNORMAL HIGH (ref 8–23)
CO2: 25 mmol/L (ref 22–32)
Calcium: 9 mg/dL (ref 8.9–10.3)
Chloride: 104 mmol/L (ref 98–111)
Creatinine, Ser: 3.08 mg/dL — ABNORMAL HIGH (ref 0.44–1.00)
GFR, Estimated: 17 mL/min — ABNORMAL LOW (ref >60)
Glucose, Bld: 84 mg/dL (ref 70–99)
Potassium: 4.2 mmol/L (ref 3.5–5.1)
Sodium: 136 mmol/L (ref 135–145)
Total Bilirubin: 0.7 mg/dL (ref 0.3–1.2)
Total Protein: 6.5 g/dL (ref 6.5–8.1)

## 2023-05-29 MED ORDER — SODIUM CHLORIDE 0.9 % IV BOLUS
500.0000 mL | Freq: Once | INTRAVENOUS | Status: AC
  Administered 2023-05-30: 500 mL via INTRAVENOUS

## 2023-05-29 MED ORDER — IBUPROFEN 600 MG PO TABS: 600.0000 mg | ORAL_TABLET | Freq: Three times a day (TID) | ORAL | Status: DC | PRN

## 2023-05-29 MED ORDER — KETOROLAC TROMETHAMINE 30 MG/ML IJ SOLN
60.0000 mg | Freq: Once | INTRAMUSCULAR | Status: AC
  Administered 2023-05-29: 60 mg via INTRAMUSCULAR
  Filled 2023-05-29: qty 2

## 2023-05-29 MED ORDER — HYDROCODONE-ACETAMINOPHEN 7.5-325 MG PO TABS
2.0000 | ORAL_TABLET | Freq: Two times a day (BID) | ORAL | 0 refills | Status: DC
Start: 2023-05-29 — End: 2023-06-28

## 2023-05-29 MED ORDER — HYDROCODONE-ACETAMINOPHEN 7.5-325 MG PO TABS
2.0000 | ORAL_TABLET | Freq: Once | ORAL | Status: AC
  Administered 2023-05-29: 2 via ORAL
  Filled 2023-05-29: qty 2

## 2023-05-29 MED ORDER — CYCLOBENZAPRINE HCL 10 MG PO TABS
10.0000 mg | ORAL_TABLET | Freq: Once | ORAL | Status: AC
  Administered 2023-05-29: 10 mg via ORAL
  Filled 2023-05-29: qty 1

## 2023-05-29 NOTE — ED Notes (Signed)
Pt getting scans done at this time.

## 2023-05-29 NOTE — Care Management (Signed)
Consult for Home Health DME, SNF. Patient presented after fall at home. She lives on the third floor apt , cannot navigate stairs. Spoke to her over the phone. She states she has no-one that can help her, her aunt and sister both have health issues, No one can stay with her. Looked over notes, CSW for [facility] was working on obtaining home health care for her. I called  the patients sister  after patient requested because she voiced concern to retain information. Patient sister Ashley Mccullough stated that the patient just goes from the bed to the [facility]. She cannot navigate stairs so she is isolated. Discussed  SNF with sister she is agreeable with this. Discussed this with provider.

## 2023-05-29 NOTE — ED Triage Notes (Signed)
Pt here via [facility] from home with a fall. No blood thinners, denies hitting her head. Pt c/o right back pain.   172/89 98% RA 82 108-cbg

## 2023-05-29 NOTE — ED Provider Notes (Signed)
[facility] Emergency Department Provider Note     Event Date/Time   First MD Initiated Contact with Patient 05/29/23 1720     (approximate)   History   Fall   HPI  Ashley Mccullough is a 62 y.o. female with a history of morbid obesity, anxiety, depression, spinal stenosis, bilateral sciatica, CKD, OSA, HLD, hypertension, presents to the ED via EMS from home.  Patient lives independently in a third floor apartment building for the last 18+ years.  She has poor mobility by her report, and had a fall today after she got up from the toilet to walk back to her bedroom.  The prolonged sitting caused aggravation of her sciatic nerve irritation and leg weakness.  She called EMS who transported the patient to the ED for evaluation.  Patient denies any blood thinner use and denies any head injury.  She endorses new right-sided low back pain.  She would give history of prior epidural steroid injections greater than 2 years ago, with intermittent PT therapy.  She had an significant increase in her weight, it was no longer under the max weight allowable for the fluoroscopy table for epidural steroid injections.  Since that time however, her PCP has had the patient on Mounjaro, and she has had an average 10 pound weight loss every 2 weeks, now down to under 300 pounds.  Due to her inability to ambulate independently, and exit her third floor apartment, she has been unable to reestablish care with the pain management clinic who was doing the prior injections.  Patient's aunt is at bedside and her sister is available via phone to provide some additional HPI, the family voices concerns for the patient's chronic pain, noted that the pain is debilitating to the point that the patient spends most of her day in the bed.   Physical Exam   Triage Vital Signs: ED Triage Vitals  Encounter Vitals Group     BP 05/29/23 1447 (!) 143/79     Systolic BP Percentile --      Diastolic BP  Percentile --      Pulse Rate 05/29/23 1447 70     Resp 05/29/23 1447 19     Temp 05/29/23 1447 98 F (36.7 C)     Temp src --      SpO2 05/29/23 1447 100 %     Weight --      Height --      Head Circumference --      Peak Flow --      Pain Score 05/29/23 1444 10     Pain Loc --      Pain Education --      Exclude from Growth Chart --     Most recent vital signs: Vitals:   05/29/23 1447 05/29/23 2038  BP: (!) 143/79 136/77  Pulse: 70 78  Resp: 19 16  Temp: 98 F (36.7 C) 98.3 F (36.8 C)  SpO2: 100% 99%    General Awake, no distress. NAD HEENT NCAT. PERRL. EOMI. No rhinorrhea. Mucous membranes are moist.  CV:  Good peripheral perfusion.  RESP:  Normal effort. CTA ABD:  No distention. nontender MSK:      ED Results / Procedures / Treatments   Labs (all labs ordered are listed, but only abnormal results are displayed) Labs Reviewed  CBC WITH DIFFERENTIAL/PLATELET - Abnormal; Notable for the following components:      Result Value   RBC 3.55 (*)  Hemoglobin 10.0 (*)    HCT 31.8 (*)    All other components within normal limits  COMPREHENSIVE METABOLIC PANEL - Abnormal; Notable for the following components:   BUN 34 (*)    Creatinine, Ser 3.08 (*)    Albumin 2.9 (*)    GFR, Estimated 17 (*)    All other components within normal limits  URINALYSIS, ROUTINE W REFLEX MICROSCOPIC     EKG   RADIOLOGY  I personally viewed and evaluated these images as part of my medical decision making, as well as reviewing the written report by the radiologist.  ED Provider Interpretation: No acute findings  DG Lumbar Spine Complete  Result Date: 05/29/2023 CLINICAL DATA:  Recent fall with low back pain, initial encounter EXAM: LUMBAR SPINE - COMPLETE 4+ VIEW COMPARISON:  03/25/2018 FINDINGS: Five lumbar type vertebral bodies are well visualized. Vertebral body height is well maintained. Disc space narrowing is noted at L4-5 and L5-S1 similar to that seen on the prior  exam. Mild anterolisthesis of L4 on L5 is noted of a slightly lesser degree than that seen on the prior exam related to facet hypertrophic changes. No soft tissue abnormality is seen. IMPRESSION: Degenerative change with mild anterolisthesis of L4 on L5. Electronically Signed   By: Alcide Clever M.D.   On: 05/29/2023 20:12   DG Hips Bilat W or Wo Pelvis 3-4 Views  Result Date: 05/29/2023 CLINICAL DATA:  Right back pain EXAM: DG HIP (WITH OR WITHOUT PELVIS) 3-4V BILAT COMPARISON:  None Available. FINDINGS: No fracture or dislocation is seen. Bilateral hip joint spaces are preserved. Visualized bony pelvis appears intact Mild degenerative changes of the lower lumbar spine. IMPRESSION: Negative. Electronically Signed   By: Charline Bills M.D.   On: 05/29/2023 20:11   CT HEAD WO CONTRAST ( )  Result Date: 05/29/2023 CLINICAL DATA:  Provided history: Fall.  Back pain. EXAM: CT HEAD WITHOUT CONTRAST CT CERVICAL SPINE WITHOUT CONTRAST TECHNIQUE: Multidetector CT imaging of the head and cervical spine was performed following the standard protocol without intravenous contrast. Multiplanar CT image reconstructions of the cervical spine were also generated. RADIATION DOSE REDUCTION: This exam was performed according to the departmental dose-optimization program which includes automated exposure control, adjustment of the mA and/or kV according to patient size and/or use of iterative reconstruction technique. COMPARISON:  None. FINDINGS: CT HEAD FINDINGS Brain: No demarcated cortical infarct. No extra-axial fluid collection. No evidence of an intracranial mass. No midline shift. Vascular: No hyperdense vessel.  Atherosclerotic calcifications. Skull: No calvarial fracture or aggressive osseous lesion. Sinuses/Orbits: No mass or acute finding within the imaged orbits. Small-volume secretions within the left frontal sinus. Small-volume secretions within the bilateral ethmoid air cells posteriorly. Minimal mucosal  thickening within the left sphenoid sinus. CT CERVICAL SPINE FINDINGS Alignment: Specific reversal of the expected cervical lordosis. No significant spondylolisthesis. Skull base and vertebrae: The basion-dental and atlanto-dental intervals are maintained.No evidence of acute fracture to the cervical spine. Soft tissues and spinal canal: No prevertebral fluid or swelling. No visible canal hematoma. Disc levels: Cervical spondylosis with multilevel disc space narrowing, disc bulges/central disc protrusions, endplate spurring and uncovertebral hypertrophy. Disc space narrowing is greatest at C5-C6, C6-C7 and C7-T1 (advanced at these levels). No appreciable high-grade spinal canal stenosis. Multilevel bony neural foraminal narrowing. Multilevel ventral osteophytes. Upper chest: No consolidation within the imaged lung apices. No visible pneumothorax. IMPRESSION: CT head: 1.  No evidence of an acute intracranial abnormality. 2. Mild paranasal sinus disease, as described. CT cervical spine:  1. No evidence of an acute cervical spine fracture. 2. Nonspecific reversal of the expected cervical lordosis. 3. Cervical spondylosis as described. Electronically Signed   By: Jackey Loge D.O.   On: 05/29/2023 16:34   CT Cervical Spine Wo Contrast  Result Date: 05/29/2023 CLINICAL DATA:  Provided history: Fall.  Back pain. EXAM: CT HEAD WITHOUT CONTRAST CT CERVICAL SPINE WITHOUT CONTRAST TECHNIQUE: Multidetector CT imaging of the head and cervical spine was performed following the standard protocol without intravenous contrast. Multiplanar CT image reconstructions of the cervical spine were also generated. RADIATION DOSE REDUCTION: This exam was performed according to the departmental dose-optimization program which includes automated exposure control, adjustment of the mA and/or kV according to patient size and/or use of iterative reconstruction technique. COMPARISON:  None. FINDINGS: CT HEAD FINDINGS Brain: No demarcated  cortical infarct. No extra-axial fluid collection. No evidence of an intracranial mass. No midline shift. Vascular: No hyperdense vessel.  Atherosclerotic calcifications. Skull: No calvarial fracture or aggressive osseous lesion. Sinuses/Orbits: No mass or acute finding within the imaged orbits. Small-volume secretions within the left frontal sinus. Small-volume secretions within the bilateral ethmoid air cells posteriorly. Minimal mucosal thickening within the left sphenoid sinus. CT CERVICAL SPINE FINDINGS Alignment: Specific reversal of the expected cervical lordosis. No significant spondylolisthesis. Skull base and vertebrae: The basion-dental and atlanto-dental intervals are maintained.No evidence of acute fracture to the cervical spine. Soft tissues and spinal canal: No prevertebral fluid or swelling. No visible canal hematoma. Disc levels: Cervical spondylosis with multilevel disc space narrowing, disc bulges/central disc protrusions, endplate spurring and uncovertebral hypertrophy. Disc space narrowing is greatest at C5-C6, C6-C7 and C7-T1 (advanced at these levels). No appreciable high-grade spinal canal stenosis. Multilevel bony neural foraminal narrowing. Multilevel ventral osteophytes. Upper chest: No consolidation within the imaged lung apices. No visible pneumothorax. IMPRESSION: CT head: 1.  No evidence of an acute intracranial abnormality. 2. Mild paranasal sinus disease, as described. CT cervical spine: 1. No evidence of an acute cervical spine fracture. 2. Nonspecific reversal of the expected cervical lordosis. 3. Cervical spondylosis as described. Electronically Signed   By: Jackey Loge D.O.   On: 05/29/2023 16:34     PROCEDURES:  Critical Care performed: No  Procedures   MEDICATIONS ORDERED IN ED: Medications  ibuprofen (ADVIL) tablet 600 mg (has no administration in time range)  ketorolac (TORADOL) 30 MG/ML injection 60 mg (60 mg Intramuscular Given 05/29/23 1935)  cyclobenzaprine  (FLEXERIL) tablet 10 mg (10 mg Oral Given 05/29/23 1933)  HYDROcodone-acetaminophen (NORCO) 7.5-325 MG per tablet 2 tablet (2 tablets Oral Given 05/29/23 1934)     IMPRESSION / MDM / ASSESSMENT AND PLAN / ED COURSE  I reviewed the triage vital signs and the nursing notes.                              Differential diagnosis includes, but is not limited to, closed head injury, SDH, concussion, lumbar compression fracture, hip fracture, pelvic fracture, cervical spine injury, acute on chronic pain  Patient's presentation is most consistent with acute complicated illness / injury requiring diagnostic workup.  Patient's diagnosis is consistent with mechanical fall at home secondary to deconditioning, spinal stenosis, and DJD.  No acute findings on CT imaging the head and cervical spine, or plain films of the lumbar spine and hips, based on interpretation.  Patient with family at bedside voicing concern for the patient's ability to care for herself in the home given her  chronic and at times intractable pain.  I advised the family that admission would not allow her to have an epidural steroid injection as she assumed and requested.  We did discuss options for placement in a skilled nursing facility to help patient with rehab and pain management.   I discussed case with on-call nurse social worker who advised that the patient did have a waiver available through her health coverage for placement in a SNF.  She advised that she could place orders for PT/OT evaluation which would occur in the morning.  Patient will have to be placed in observation on border status if no indication for admission was found.  Patient is agreeable understanding to the plan at this time for observation status where she will remain in the ED until she can be evaluated by PT OT staff.  Efforts will be made to have the patient transferred from the ED to a SNF based on availability and insurance coverages.     FINAL CLINICAL  IMPRESSION(S) / ED DIAGNOSES   Final diagnoses:  Fall in home, initial encounter  Minor head injury, initial encounter  Lumbar strain, initial encounter  Chronic bilateral low back pain with bilateral sciatica     Rx / DC Orders   ED Discharge Orders     None        Note:  This document was prepared using Dragon voice recognition software and may include unintentional dictation errors.    Lissa Hoard, PA-C 05/29/23 2339    Minna Antis, MD 06/01/23 2246

## 2023-05-30 LAB — URINALYSIS, ROUTINE W REFLEX MICROSCOPIC
Bilirubin Urine: NEGATIVE
Glucose, UA: NEGATIVE mg/dL
Hgb urine dipstick: NEGATIVE
Ketones, ur: 5 mg/dL — AB
Leukocytes,Ua: NEGATIVE
Nitrite: NEGATIVE
Protein, ur: 100 mg/dL — AB
Specific Gravity, Urine: 1.032 — ABNORMAL HIGH (ref 1.005–1.030)
pH: 5 (ref 5.0–8.0)

## 2023-05-30 MED ORDER — LORATADINE 10 MG PO TABS
10.0000 mg | ORAL_TABLET | Freq: Every day | ORAL | Status: DC
  Administered 2023-05-30 – 2023-06-03 (×5): 10 mg via ORAL
  Filled 2023-05-30 (×5): qty 1

## 2023-05-30 MED ORDER — HYDROCODONE-ACETAMINOPHEN 7.5-325 MG PO TABS
2.0000 | ORAL_TABLET | Freq: Two times a day (BID) | ORAL | Status: DC
  Administered 2023-05-30 – 2023-06-03 (×8): 2 via ORAL
  Filled 2023-05-30 (×8): qty 2

## 2023-05-30 MED ORDER — CALCITRIOL 0.25 MCG PO CAPS
0.2500 ug | ORAL_CAPSULE | Freq: Every day | ORAL | Status: DC
  Administered 2023-05-31 – 2023-06-03 (×4): 0.25 ug via ORAL
  Filled 2023-05-30 (×4): qty 1

## 2023-05-30 MED ORDER — MONTELUKAST SODIUM 10 MG PO TABS
10.0000 mg | ORAL_TABLET | Freq: Every day | ORAL | Status: DC
  Administered 2023-05-30 – 2023-06-03 (×5): 10 mg via ORAL
  Filled 2023-05-30 (×5): qty 1

## 2023-05-30 MED ORDER — ACETAMINOPHEN 325 MG PO TABS
650.0000 mg | ORAL_TABLET | Freq: Every day | ORAL | Status: DC
  Administered 2023-05-30 – 2023-06-02 (×4): 650 mg via ORAL
  Filled 2023-05-30 (×4): qty 2

## 2023-05-30 MED ORDER — TRAZODONE HCL 50 MG PO TABS
50.0000 mg | ORAL_TABLET | Freq: Every evening | ORAL | Status: DC | PRN
  Administered 2023-05-31: 50 mg via ORAL
  Filled 2023-05-30: qty 1

## 2023-05-30 MED ORDER — FLUOXETINE HCL 20 MG PO CAPS
60.0000 mg | ORAL_CAPSULE | Freq: Every day | ORAL | Status: DC
  Administered 2023-05-30 – 2023-06-03 (×5): 60 mg via ORAL
  Filled 2023-05-30 (×5): qty 3

## 2023-05-30 MED ORDER — HYDROXYZINE PAMOATE 25 MG PO CAPS: 25.0000 mg | ORAL_CAPSULE | Freq: Three times a day (TID) | ORAL | Status: DC | PRN

## 2023-05-30 MED ORDER — METOPROLOL SUCCINATE ER 25 MG PO TB24
25.0000 mg | ORAL_TABLET | Freq: Every day | ORAL | Status: DC
  Administered 2023-05-30 – 2023-06-03 (×5): 25 mg via ORAL
  Filled 2023-05-30 (×5): qty 1

## 2023-05-30 MED ORDER — GABAPENTIN 300 MG PO CAPS
600.0000 mg | ORAL_CAPSULE | Freq: Three times a day (TID) | ORAL | Status: DC
  Administered 2023-05-30 – 2023-06-03 (×12): 600 mg via ORAL
  Filled 2023-05-30 (×12): qty 2

## 2023-05-30 MED ORDER — TIRZEPATIDE 5 MG/0.5ML ~~LOC~~ SOAJ: 5.0000 mg | SUBCUTANEOUS | Status: DC

## 2023-05-30 MED ORDER — POLYETHYLENE GLYCOL 3350 17 G PO PACK
17.0000 g | PACK | Freq: Two times a day (BID) | ORAL | Status: DC
  Administered 2023-05-31 – 2023-06-03 (×6): 17 g via ORAL
  Filled 2023-05-30 (×5): qty 1

## 2023-05-30 MED ORDER — PROCHLORPERAZINE MALEATE 10 MG PO TABS
10.0000 mg | ORAL_TABLET | Freq: Four times a day (QID) | ORAL | Status: DC | PRN
  Administered 2023-06-03: 10 mg via ORAL
  Filled 2023-05-30 (×2): qty 1

## 2023-05-30 MED ORDER — HYDRALAZINE HCL 50 MG PO TABS
50.0000 mg | ORAL_TABLET | Freq: Three times a day (TID) | ORAL | Status: DC
  Administered 2023-05-30 – 2023-06-03 (×11): 50 mg via ORAL
  Filled 2023-05-30 (×12): qty 1

## 2023-05-30 NOTE — ED Notes (Signed)
Taking over care of pt at this time

## 2023-05-30 NOTE — ED Notes (Signed)
Report received from Gerilyn Pilgrim, RN and assumed care of patient at this time. Patient is currently resting in ER stretcher, even rise and fall of chest noted. No needs identified at this time.

## 2023-05-30 NOTE — ED Notes (Signed)
PT at bedside.

## 2023-05-30 NOTE — ED Provider Notes (Signed)
Emergency Medicine Observation Re-evaluation Note  Ashley Mccullough is a 62 y.o. female, seen on rounds today.  Pt initially presented to the ED for complaints of Fall  Currently, the patient is resting in bed. No reported issues from nursing team.   Physical Exam  BP 137/67 (BP Location: Right Arm)   Pulse 72   Temp 97.6 F (36.4 C) (Oral)   Resp 17   SpO2 100%  Physical Exam General: Resting in bed  ED Course / MDM   I have reviewed the labs and medications over the past 24 hours.  Plan  Current plan is for dispo per social work, received PT OT evaluation today.    Trinna Post, MD 05/30/23 [phone]

## 2023-05-30 NOTE — ED Notes (Signed)
Pt complaining of being in pain. Patient recently received pain medicines. Going to attempt to move patient from stretcher to inpatient bed to help assist with repositioning and better comfort of patient.

## 2023-05-30 NOTE — TOC Initial Note (Signed)
Transition of Care [facility]) - Initial/Assessment Note    Patient Details  Name: Ashley Mccullough MRN: [id number] Date of Birth: March 26, 1961  [facility] CM/SW Contact:    Colette Ribas, LCSWA Phone Number: 05/30/2023, 2:14 PM  Clinical Narrative:                  CSW met with patient bedside, she request her sister to be called during assessment. PCP & pharmacy on file still valid. DME: [employer], Pt was at [facility] health care previously and did not enjoy it, so they are interested n another facility, got permission to start bed search relayed I will follow-up once bed offers come in. Advised sister to also look on medicare.gov for ratings on SNF.   Expected Discharge Plan: Skilled Nursing Facility Barriers to Discharge: Continued Medical Work up   Patient Goals and CMS Choice Patient states their goals for this hospitalization and ongoing recovery are:: to return home   Choice offered to / list presented to : Patient      Expected Discharge Plan and Services    Return to Home after short term rehab.                                          Prior Living Arrangements/Services   Lives with:: Self Patient language and need for interpreter reviewed:: No Do you feel safe going back to the place where you live?: Yes      Need for Family Participation in Patient Care: Yes (Comment) Care giver support system in place?: No (comment) Current home services: DME Criminal Activity/Legal Involvement Pertinent to Current Situation/Hospitalization: No - Comment as needed  Activities of Daily Living      Permission Sought/Granted Permission sought to share information with : Family Supports (Sister & Aunt)                Emotional Assessment Appearance:: Appears stated age Attitude/Demeanor/Rapport: Gracious Affect (typically observed): Accepting Orientation: : Oriented to Self, Oriented to Place, Oriented to  Time, Oriented to Situation       Admission diagnosis:  fall, back pain, ems Patient Active Problem List   Diagnosis Date Noted   Hypertensive kidney disease with stage 4 chronic kidney disease (HCC) 05/13/2023   Impaired mobility and activities of daily living 05/13/2023   Other chronic pain 05/13/2023   Depression, recurrent (HCC) 05/13/2023   Microalbuminuria 01/05/2023   Iron deficiency anemia 10/25/2022   Self-care deficit for feeding, bathing, and toileting 10/20/2022   Personal history of fall 10/20/2022   Chronic back pain 10/20/2022   Ambulatory dysfunction 10/20/2022   OSA (obstructive sleep apnea) 10/20/2022   Symptomatic anemia 10/20/2022   Hematochezia 10/20/2022   Panniculitis 06/02/2022   Need for influenza vaccination 06/02/2022   Mixed hyperlipidemia 06/02/2022   Anxiety 06/02/2022   Annual physical exam 10/17/2021   CKD (chronic kidney disease) stage 4, GFR 15-29 ml/min (HCC) 06/06/2021   Hypersomnia 06/05/2021   Lumbar radiculitis 06/05/2021   Encounter for screening mammogram for malignant neoplasm of breast 06/05/2021   Chronic pain syndrome 04/11/2021   Bilateral primary osteoarthritis of knee 04/11/2021   Lumbar spondylosis 04/11/2021   Sacroiliac joint pain 04/11/2021   Chronic pain of both knees 06/21/2020   Chronic radicular lumbar pain 11/18/2019   Spinal stenosis of lumbar region with neurogenic claudication 11/18/2019   DDD (degenerative disc disease),  lumbosacral 11/18/2019   BMI 60.0-69.9, adult (HCC) 07/01/2017   Class 3 severe obesity with body mass index (BMI) of 60.0 to 69.9 in adult (HCC) 12/24/2015   Anxiety, generalized 05/29/2015   D (diarrhea) 05/29/2015   Avitaminosis D 05/29/2015   Prediabetes 05/29/2015   Fatigue 05/29/2015   Depression 03/15/2015   Cannot sleep 11/13/2009   Congenital pes planus 03/13/2008   Hypercholesterolemia without hypertriglyceridemia 09/16/2006   PCP:  Jacky Kindle, FNP Pharmacy:   [facility] [phone] [address], [place] - [address] [place] [address] Phone: [phone] Fax: [phone]     Social Determinants of Health (SDOH) Social History: SDOH Screenings   Food Insecurity: No Food Insecurity (05/14/2023)  Housing: Low Risk  (10/21/2022)  Transportation Needs: No Transportation Needs (10/21/2022)  Utilities: Patient Declined (10/21/2022)  Alcohol Screen: Low Risk  (06/02/2022)  Depression (PHQ2-9): Medium Risk (05/13/2023)  Social Connections: Socially Isolated (03/04/2022)  Stress: No Stress Concern Present (03/04/2022)  Tobacco Use: Low Risk  (05/29/2023)   SDOH Interventions:     Readmission Risk Interventions     No data to display

## 2023-05-30 NOTE — Evaluation (Signed)
Physical Therapy Evaluation Patient Details Name: Ashley Mccullough MRN: [id number] DOB: 1960-11-15 Today's Date: 05/30/2023  History of Present Illness  Ashley Mccullough is a 62 y.o. female with a history of morbid obesity, anxiety, depression, spinal stenosis, bilateral sciatica, CKD, OSA, HLD, hypertension, presents to the ED via EMS from home.  Patient lives independently in a third floor apartment building for the last 18+ years.  She has poor mobility by her report, and had a fall today after she got up from the toilet to walk back to her bedroom.  The prolonged sitting caused aggravation of her sciatic nerve irritation and leg weakness.  She called EMS who transported the patient to the ED for evaluation.   Clinical Impression  Pt admitted with above diagnosis. Pt currently with functional limitations due to the deficits listed below (see PT Problem List). Pt received upright in bed agreeable to PT/OT co-eval for pt/therapist safety. PTA pt reports limited baseline mobility primarily bed bound only transferring to<> from bed and BSC for toielting and sponge baths. Has aide 3x/week for ADL's/IADL's assistance. Has had 3 falls in the last 6 months.   To date, pt reliant on modA+2 for bed mobility for supine to sitting, CGA for standing at RW from elevate surface. Effortful x3 steps to the L using RW towards [facility] requiring termination of activity due to LE weakness and significant worsening of LBP requiring seated rest and maxA+2 for return to supine at LE's and torso. Pt educated on pain modulation techniques in setting of known lumbar stenosis but also how this diagnosis can affects mobility and increased falls risk. Reviewed importance of graded mobility and OOB mobility to tolerance to attempt to improve upon LE strength and maximize independence in functional mobility. Pt understanding with all needs in reach. Pt will benefit from additional PT services at discharge prior return to home  environment to reduce falls risk and improve functional mobility.        If plan is discharge home, recommend the following: Two people to help with walking and/or transfers;A lot of help with bathing/dressing/bathroom;Assistance with cooking/housework;Assist for transportation;Help with stairs or ramp for entrance   Can travel by private vehicle   No    Equipment Recommendations Other (comment) (rail installment on bed for improved bed mobility)  Recommendations for Other Services       Functional Status Assessment Patient has had a recent decline in their functional status and demonstrates the ability to make significant improvements in function in a reasonable and predictable amount of time.     Precautions / Restrictions Precautions Precautions: Fall Restrictions Weight Bearing Restrictions: No      Mobility  Bed Mobility Overal bed mobility: Needs Assistance Bed Mobility: Supine to Sit, Sit to Supine     Supine to sit: Mod assist, +2 for physical assistance Sit to supine: Max assist, +2 for physical assistance   General bed mobility comments: reliant assistance at LE's and torso. Patient Response: Cooperative  Transfers Overall transfer level: Needs assistance Equipment used: Rolling walker (2 wheels) Transfers: Sit to/from Stand Sit to Stand: From elevated surface, Contact guard assist           General transfer comment: 2-3 steps to the L towards HOB. Very effortful and painful with pt requiring return to seated rest.    Ambulation/Gait               General Gait Details: deferred due to pain and LE weakness  Stairs  Wheelchair Mobility     Tilt Bed Tilt Bed Patient Response: Cooperative  Modified Rankin (Stroke Patients Only)       Balance Overall balance assessment: Needs assistance Sitting-balance support: Bilateral upper extremity supported, Feet unsupported Sitting balance-Leahy Scale: Fair     Standing balance  support: Bilateral upper extremity supported, During functional activity Standing balance-Leahy Scale: Poor Standing balance comment: reliant on RW to stand                             Pertinent Vitals/Pain Pain Assessment Pain Assessment: Faces Faces Pain Scale: Hurts even more Pain Location: R sided Lower back Pain Descriptors / Indicators: Aching, Dull, Grimacing, Numbness, Tightness, Tingling, Sharp Pain Intervention(s): Limited activity within patient's tolerance, Monitored during session, Repositioned    Home Living Family/patient expects to be discharged to:: Private residence Living Arrangements: Alone Available Help at Discharge: Family;Available PRN/intermittently Type of Home: Apartment Home Access: Level entry       Home Layout: One level Home Equipment: Agricultural consultant (2 wheels);Tub bench;BSC/3in1;Wheelchair - manual Additional Comments: pt lives in 3rd story apartment (w/o elevator), has walker and cane.  aunt and sister intermittently available. Adjustable bed.    Prior Function Prior Level of Function : Needs assist       Physical Assist : Mobility (physical);ADLs (physical)   ADLs (physical): Grooming;Bathing;Dressing;Toileting;IADLs Mobility Comments: Has an aide 3x/week for 4 hour increments for ADL's/IADL's. ADLs Comments: Mainly laying in bed with step pivot transfers to<> from [facility] for toileting. Performs spongebaths as able at baseline.     Extremity/Trunk Assessment   Upper Extremity Assessment Upper Extremity Assessment: Defer to OT evaluation    Lower Extremity Assessment Lower Extremity Assessment: Generalized weakness;RLE deficits/detail;LLE deficits/detail RLE Deficits / Details: limited knee flexion to donn socks RLE Sensation: WNL LLE Deficits / Details: limited knee flexion to donn socks LLE Sensation: WNL       Communication   Communication Communication: No apparent difficulties Cueing Techniques: Verbal cues   Cognition Arousal: Alert Behavior During Therapy: WFL for tasks assessed/performed Overall Cognitive Status: Within Functional Limits for tasks assessed                                 General Comments: pleasant and cooperative        General Comments      Exercises Other Exercises Other Exercises: Role of PT/OT in acute setting, pain modulation techniques based off of PMH, importance of mobility for strength and improving functional mobility/independence.   Assessment/Plan    PT Assessment Patient needs continued PT services  PT Problem List Decreased strength;Decreased activity tolerance;Decreased balance;Pain;Decreased mobility;Obesity       PT Treatment Interventions DME instruction;Gait training;Patient/family education;Functional mobility training;Therapeutic activities;Therapeutic exercise;Balance training;Neuromuscular re-education    PT Goals (Current goals can be found in the Care Plan section)  Acute Rehab PT Goals Patient Stated Goal: improve function and pain PT Goal Formulation: With patient Time For Goal Achievement: 06/13/23 Potential to Achieve Goals: Fair    Frequency Min 1X/week     Co-evaluation PT/OT/SLP Co-Evaluation/Treatment: Yes Reason for Co-Treatment: Complexity of the patient's impairments (multi-system involvement);For patient/therapist safety;To address functional/ADL transfers PT goals addressed during session: Mobility/safety with mobility;Proper use of DME;Balance;Strengthening/ROM OT goals addressed during session: ADL's and self-care;Strengthening/ROM       AM-PAC PT "6 Clicks" Mobility  Outcome Measure Help needed turning from your back to your  side while in a flat bed without using bedrails?: A Lot Help needed moving from lying on your back to sitting on the side of a flat bed without using bedrails?: A Lot Help needed moving to and from a bed to a chair (including a wheelchair)?: Total Help needed standing up from  a chair using your arms (e.g., wheelchair or bedside chair)?: A Lot Help needed to walk in hospital room?: Total Help needed climbing 3-5 steps with a railing? : Total 6 Click Score: 9    End of Session Equipment Utilized During Treatment: Gait belt Activity Tolerance: Patient limited by pain Patient left: in bed Nurse Communication: Mobility status PT Visit Diagnosis: Muscle weakness (generalized) (M62.81);History of falling (Z91.81);Difficulty in walking, not elsewhere classified (R26.2);Pain Pain - Right/Left: Right Pain - part of body: Leg (lower back pain and radicular pain down LLE as well)    Time: 7829-5621 PT Time Calculation (min) (ACUTE ONLY): 26 min   Charges:   PT Evaluation $PT Eval Moderate Complexity: 1 Mod PT Treatments $Therapeutic Activity: 8-22 mins PT General Charges $$ ACUTE PT VISIT: 1 Visit         Delphia Grates. Fairly IV, PT, DPT Physical Therapist- [place]  [facility]  05/30/2023, 10:52 AM

## 2023-05-30 NOTE — Evaluation (Signed)
Occupational Therapy Evaluation Patient Details Name: Ashley Mccullough MRN: [id number] DOB: Feb 14, 1961 Today's Date: 05/30/2023   History of Present Illness Ashley Mccullough is a 62 y.o. female with a history of morbid obesity, anxiety, depression, spinal stenosis, bilateral sciatica, CKD, OSA, HLD, hypertension, presents to the ED via EMS from home.  Patient lives independently in a third floor apartment building for the last 18+ years.  She has poor mobility by her report, and had a fall today after she got up from the toilet to walk back to her bedroom.  The prolonged sitting caused aggravation of her sciatic nerve irritation and leg weakness.  She called EMS who transported the patient to the ED for evaluation.   Clinical Impression   [name] was seen for OT/PT co-evaluation this date. Prior to hospital admission, pt required some assistance from family/PCA for bathing, dressing, and IADL management. She reports being independent with bed mobility, toilet transfers and toileting. Pt lives alone in a 3rd floor apartment with no elevator access. She endorses being primarily home bound recently due to her limited mobility. She reports that her goal is to be able to walk with a cane again. Pt presents to acute OT demonstrating impaired ADL performance and functional mobility 2/2 generalized weakness, increased pain with mobility, and decreased activity tolerance (See OT problem list for additional functional deficits). Pt currently requires MOD A +2 for bed mobility, close CGA for safety to take a few side steps toward HOB, and MAX A for LB ADL management at bed level.  Pt would benefit from skilled OT services to address noted impairments and functional limitations (see below for any additional details) in order to maximize safety and independence while minimizing falls risk and caregiver burden. Anticipate the need for follow up therapy services upon acute hospital DC (</= 3 hours/day).         If plan is discharge home, recommend the following: Two people to help with walking and/or transfers;A lot of help with bathing/dressing/bathroom;Assistance with cooking/housework;Assist for transportation;Help with stairs or ramp for entrance    Functional Status Assessment  Patient has had a recent decline in their functional status and demonstrates the ability to make significant improvements in function in a reasonable and predictable amount of time.  Equipment Recommendations  Other (comment) (defer to next venue of care)    Recommendations for Other Services       Precautions / Restrictions Precautions Precautions: Fall Restrictions Weight Bearing Restrictions: No      Mobility Bed Mobility Overal bed mobility: Needs Assistance Bed Mobility: Supine to Sit, Sit to Supine     Supine to sit: Mod assist, +2 for physical assistance Sit to supine: Max assist, +2 for physical assistance   General bed mobility comments: reliant assistance at LE's and torso.    Transfers Overall transfer level: Needs assistance Equipment used: Rolling walker (2 wheels) Transfers: Sit to/from Stand Sit to Stand: From elevated surface, Contact guard assist           General transfer comment: 2-3 steps to the L towards HOB. Very effortful and painful with pt requiring return to seated rest.      Balance Overall balance assessment: Needs assistance Sitting-balance support: Bilateral upper extremity supported, Feet unsupported Sitting balance-Leahy Scale: Fair Sitting balance - Comments: steady with static sitting balance at EOB (gurney bed in ED)   Standing balance support: Bilateral upper extremity supported, During functional activity, Reliant on assistive device for balance Standing balance-Leahy Scale: Poor Standing  balance comment: reliant on RW to stand, heavy forward lean on RW 2/2 back pain.                           ADL either performed or assessed with  clinical judgement   ADL Overall ADL's : Needs assistance/impaired                                       General ADL Comments: Pt is functionally limited 2/2 generalized weakness, back pain with movement/mobility, and decreased activity tolerance. She requires MAX A to don bilat socks at bed level, MOD A +2 for bed mobility, CGA to STS from bed at elevated height and to take a few steps toward [facility].     Vision Baseline Vision/History: 1 Wears glasses Ability to See in Adequate Light: 1 Impaired Patient Visual Report: No change from baseline       Perception         Praxis         Pertinent Vitals/Pain Pain Assessment Pain Assessment: Faces Faces Pain Scale: Hurts even more Pain Location: R sided Lower back Pain Descriptors / Indicators: Aching, Dull, Grimacing, Numbness, Tightness, Tingling, Sharp Pain Intervention(s): Limited activity within patient's tolerance, Monitored during session, Repositioned     Extremity/Trunk Assessment Upper Extremity Assessment Upper Extremity Assessment: Generalized weakness   Lower Extremity Assessment Lower Extremity Assessment: Generalized weakness;Defer to PT evaluation (limited knee flexion to don socks bilat) RLE Deficits / Details: limited knee flexion to donn socks RLE Sensation: WNL LLE Deficits / Details: limited knee flexion to donn socks LLE Sensation: WNL       Communication Communication Communication: No apparent difficulties Cueing Techniques: Verbal cues   Cognition Arousal: Alert Behavior During Therapy: WFL for tasks assessed/performed Overall Cognitive Status: Within Functional Limits for tasks assessed                                 General Comments: pleasant and cooperative     General Comments       Exercises Other Exercises Other Exercises: Role of PT/OT in acute setting, energy conservation strategies, safe transfer techniques, DC recs, &importance of mobility for  strength and improving functional mobility/independence.   Shoulder Instructions      Home Living Family/patient expects to be discharged to:: Private residence Living Arrangements: Alone Available Help at Discharge: Family;Available PRN/intermittently Type of Home: Apartment Home Access: Stairs to enter Entrance Stairs-Number of Steps: 3rd floor apatrment, 3 full flights of steps to enter   Home Layout: One level     Bathroom Shower/Tub: Chief Strategy Officer: Standard Bathroom Accessibility: Yes   Home Equipment: Agricultural consultant (2 wheels);Tub bench;BSC/3in1;Wheelchair - manual   Additional Comments: pt lives in 3rd story apartment (w/o elevator), has walker and cane.  aunt and sister intermittently available. Adjustable bed.      Prior Functioning/Environment Prior Level of Function : Needs assist       Physical Assist : Mobility (physical);ADLs (physical)   ADLs (physical): Grooming;Bathing;Dressing;Toileting;IADLs Mobility Comments: Has an aide 3x/week for 4 hour increments for ADL's/IADL's. ADLs Comments: Mainly laying in bed with step pivot transfers independently to<> from [facility] for toileting. Performs spongebaths as able at baseline.        OT Problem List: Decreased strength;Decreased coordination;Pain;Decreased activity tolerance;Decreased  safety awareness;Impaired balance (sitting and/or standing);Decreased knowledge of use of DME or AE      OT Treatment/Interventions: Self-care/ADL training;Therapeutic exercise;Therapeutic activities;DME and/or AE instruction;Patient/family education;Balance training;Energy conservation    OT Goals(Current goals can be found in the care plan section) Acute Rehab OT Goals Patient Stated Goal: To go home OT Goal Formulation: With patient Time For Goal Achievement: 06/13/23 Potential to Achieve Goals: Good ADL Goals Pt Will Perform Grooming: sitting;with modified independence Pt Will Perform Lower Body Dressing:  sit to/from stand;with min assist;with adaptive equipment Pt Will Transfer to Toilet: bedside commode;with set-up;with supervision;stand pivot transfer Pt Will Perform Toileting - Clothing Manipulation and hygiene: sit to/from stand;with supervision;with set-up;with adaptive equipment  OT Frequency: Min 1X/week    Co-evaluation   Reason for Co-Treatment: Complexity of the patient's impairments (multi-system involvement);For patient/therapist safety;To address functional/ADL transfers PT goals addressed during session: Mobility/safety with mobility;Proper use of DME;Balance;Strengthening/ROM OT goals addressed during session: ADL's and self-care;Strengthening/ROM      AM-PAC OT "6 Clicks" Daily Activity     Outcome Measure Help from another person eating meals?: A Little Help from another person taking care of personal grooming?: A Little Help from another person toileting, which includes using toliet, bedpan, or urinal?: A Lot Help from another person bathing (including washing, rinsing, drying)?: A Lot Help from another person to put on and taking off regular upper body clothing?: A Little Help from another person to put on and taking off regular lower body clothing?: A Lot 6 Click Score: 15   End of Session Equipment Utilized During Treatment: Gait belt;Rolling walker (2 wheels)  Activity Tolerance: Patient tolerated treatment well Patient left: in bed;with call bell/phone within reach  OT Visit Diagnosis: Other abnormalities of gait and mobility (R26.89);Muscle weakness (generalized) (M62.81);Pain Pain - Right/Left: Right (R>L but pain bilat) Pain - part of body:  (back pain)                Time: 4098-1191 OT Time Calculation (min): 30 min Charges:  OT General Charges $OT Visit: 1 Visit OT Evaluation $OT Eval Moderate Complexity: 1 Mod  Kairi Tufo Smith Robert, M.S., OTR/L 05/30/23, 11:55 AM

## 2023-05-31 NOTE — TOC Progression Note (Signed)
Transition of Care [facility]) - Progression Note    Patient Details  Name: Ashley Mccullough MRN: [id number] Date of Birth: [birth year]  Transition of Care [facility]) CM/SW Contact  Liliana Cline, LCSW Phone Number: 05/31/2023, 11:28 AM  Clinical Narrative:    SNF workup started. PASRR pending.    Expected Discharge Plan: Skilled Nursing Facility Barriers to Discharge: Continued Medical Work up  Expected Discharge Plan and Services                                               Social Determinants of Health (SDOH) Interventions SDOH Screenings   Food Insecurity: No Food Insecurity (05/14/2023)  Housing: Low Risk  (10/21/2022)  Transportation Needs: No Transportation Needs (10/21/2022)  Utilities: Patient Declined (10/21/2022)  Alcohol Screen: Low Risk  (06/02/2022)  Depression (PHQ2-9): Medium Risk (05/13/2023)  Social Connections: Socially Isolated (03/04/2022)  Stress: No Stress Concern Present (03/04/2022)  Tobacco Use: Low Risk  (05/29/2023)    Readmission Risk Interventions     No data to display

## 2023-05-31 NOTE — ED Provider Notes (Signed)
Emergency Medicine Observation Re-evaluation Note  Ashley Mccullough is a 62 y.o. female, seen on rounds today.  Pt initially presented to the ED for complaints of Fall Currently, the patient is awaiting social work dispo.  Physical Exam  BP (!) 107/50 (BP Location: Left Arm)   Pulse 77   Temp 98.2 F (36.8 C) (Oral)   Resp 16   SpO2 96%  Physical Exam General: resting comfortably  ED Course / MDM  No new results in past 24 hours  Plan  Current plan is for social work dispo.    Phineas Semen, MD 05/31/23 [id number]

## 2023-05-31 NOTE — TOC PASRR Note (Cosign Needed Addendum)
RE: Ashley Mccullough Date of Birth:  11/15/1960 Date:05/31/23  To Whom It May Concern: Please be advised that the above-named patient will require a short-term nursing home stay - anticipated 30 days or less for rehabilitation and strengthening.  The plan is for return home.

## 2023-05-31 NOTE — [place] FL2 (Signed)
[place] MEDICAID [id number] LEVEL OF CARE FORM     IDENTIFICATION  Patient Name: Ashley Mccullough Birthdate: Sep 09, 1961 Sex: female Admission Date (Current Location): 05/29/2023  [facility] and [place] Number:  Chiropodist and Address:  [facility], [address], [place], [place] [address]      Provider Number: [id number]  Attending Physician Name and Address:  No att. providers found  Relative Name and Phone Number:  Diana Eves (Sister)  [phone] [facility])    Current Level of Care: Hospital Recommended Level of Care: Skilled Nursing Facility Prior Approval Number:    Date Approved/Denied:   PASRR Number: *pending*  Discharge Plan:      Current Diagnoses: Patient Active Problem List   Diagnosis Date Noted   Hypertensive kidney disease with stage 4 chronic kidney disease (HCC) 05/13/2023   Impaired mobility and activities of daily living 05/13/2023   Other chronic pain 05/13/2023   Depression, recurrent (HCC) 05/13/2023   Microalbuminuria 01/05/2023   Iron deficiency anemia 10/25/2022   Self-care deficit for feeding, bathing, and toileting 10/20/2022   Personal history of fall 10/20/2022   Chronic back pain 10/20/2022   Ambulatory dysfunction 10/20/2022   OSA (obstructive sleep apnea) 10/20/2022   Symptomatic anemia 10/20/2022   Hematochezia 10/20/2022   Panniculitis 06/02/2022   Need for influenza vaccination 06/02/2022   Mixed hyperlipidemia 06/02/2022   Anxiety 06/02/2022   Annual physical exam 10/17/2021   CKD (chronic kidney disease) stage 4, GFR 15-29 ml/min (HCC) 06/06/2021   Hypersomnia 06/05/2021   Lumbar radiculitis 06/05/2021   Encounter for screening mammogram for malignant neoplasm of breast 06/05/2021   Chronic pain syndrome 04/11/2021   Bilateral primary osteoarthritis of knee 04/11/2021   Lumbar spondylosis 04/11/2021   Sacroiliac joint pain 04/11/2021   Chronic pain of both knees 06/21/2020    Chronic radicular lumbar pain 11/18/2019   Spinal stenosis of lumbar region with neurogenic claudication 11/18/2019   DDD (degenerative disc disease), lumbosacral 11/18/2019   BMI 60.0-69.9, adult (HCC) 07/01/2017   Class 3 severe obesity with body mass index (BMI) of 60.0 to 69.9 in adult (HCC) 12/24/2015   Anxiety, generalized 05/29/2015   D (diarrhea) 05/29/2015   Avitaminosis D 05/29/2015   Prediabetes 05/29/2015   Fatigue 05/29/2015   Depression 03/15/2015   Cannot sleep 11/13/2009   Congenital pes planus 03/13/2008   Hypercholesterolemia without hypertriglyceridemia 09/16/2006    Orientation RESPIRATION BLADDER Height & Weight     Self, Time, Situation, Place  Normal External catheter Weight:   Height:     BEHAVIORAL SYMPTOMS/MOOD NEUROLOGICAL BOWEL NUTRITION STATUS        Diet (carb modified)  AMBULATORY STATUS COMMUNICATION OF NEEDS Skin   Extensive Assist Verbally                         Personal Care Assistance Level of Assistance  Bathing, Feeding, Dressing Bathing Assistance: Maximum assistance Feeding assistance: Maximum assistance Dressing Assistance: Maximum assistance     Functional Limitations Info             SPECIAL CARE FACTORS FREQUENCY  PT (By licensed PT), OT (By licensed OT)     PT Frequency: 5 times per week OT Frequency: 5 times per week            Contractures      Additional Factors Info  Code Status, Allergies Code Status Info: full Allergies Info: Cashew Nut Oil, Latex, Norvasc (Amlodipine), Simvastatin, Sulfa Antibiotics, Levofloxacin,  Prednisone           Current Medications (05/31/2023):  This is the current hospital active medication list Current Facility-Administered Medications  Medication Dose Route Frequency Provider Last Rate Last Admin   acetaminophen (TYLENOL) tablet 650 mg  650 mg Oral QHS Trinna Post, MD   650 mg at 05/30/23 2234   calcitRIOL (ROCALTROL) capsule 0.25 mcg  0.25 mcg Oral Daily Ray, Neha, MD    0.25 mcg at 05/31/23 1048   FLUoxetine (PROZAC) capsule 60 mg  60 mg Oral Daily Trinna Post, MD   60 mg at 05/31/23 1111   gabapentin (NEURONTIN) capsule 600 mg  600 mg Oral TID Trinna Post, MD   600 mg at 05/31/23 1048   hydrALAZINE (APRESOLINE) tablet 50 mg  50 mg Oral TID Trinna Post, MD   50 mg at 05/31/23 1111   HYDROcodone-acetaminophen (NORCO) 7.5-325 MG per tablet 2 tablet  2 tablet Oral BID Trinna Post, MD   2 tablet at 05/31/23 1049   hydrOXYzine (VISTARIL) capsule 25 mg  25 mg Oral Q8H PRN Trinna Post, MD       ibuprofen (ADVIL) tablet 600 mg  600 mg Oral Q8H PRN Menshew, Jenise V Bacon, PA-C       loratadine (CLARITIN) tablet 10 mg  10 mg Oral Daily Ray, Neha, MD   10 mg at 05/31/23 1049   metoprolol succinate (TOPROL-XL) 24 hr tablet 25 mg  25 mg Oral Daily Ray, Neha, MD   25 mg at 05/31/23 1051   montelukast (SINGULAIR) tablet 10 mg  10 mg Oral Daily Ray, Danie Binder, MD   10 mg at 05/31/23 1111   polyethylene glycol (MIRALAX / GLYCOLAX) packet 17 g  17 g Oral BID Trinna Post, MD   17 g at 05/31/23 1112   prochlorperazine (COMPAZINE) tablet 10 mg  10 mg Oral Q6H PRN Trinna Post, MD       traZODone (DESYREL) tablet 50 mg  50 mg Oral QHS PRN Trinna Post, MD       Current Outpatient Medications  Medication Sig Dispense Refill   acetaminophen (TYLENOL) 650 MG CR tablet Take 650-1,300 mg by mouth at bedtime. As needed for arthritis.     calcitRIOL (ROCALTROL) 0.25 MCG capsule TAKE 1 CAPSULE BY MOUTH 1 TIME EACH DAY 90 capsule 3   Cetirizine HCl 10 MG CAPS Take 1 capsule by mouth daily.     FLUoxetine (PROZAC) 20 MG capsule Take 3 capsules (60 mg total) by mouth daily. Take 3 capsules daily. 270 capsule 3   gabapentin (NEURONTIN) 300 MG capsule Take 2 capsules (600 mg total) by mouth 3 (three) times daily. 180 capsule 11   hydrALAZINE (APRESOLINE) 50 MG tablet Take 1 tablet (50 mg total) by mouth 3 (three) times daily. 270 tablet 3   HYDROcodone-acetaminophen (NORCO) 7.5-325 MG tablet Take 2 tablets by mouth  in the morning and at bedtime. 120 tablet 0   losartan (COZAAR) 100 MG tablet Take 1 tablet (100 mg total) by mouth daily. 90 tablet 3   metoprolol succinate (TOPROL-XL) 25 MG 24 hr tablet TAKE 1 TABLET BY MOUTH DAILY 90 tablet 0   montelukast (SINGULAIR) 10 MG tablet Take 1 tablet (10 mg total) by mouth daily. 90 tablet 1   polyethylene glycol (MIRALAX / GLYCOLAX) 17 g packet Take 17 g by mouth 2 (two) times daily. 14 each 0   prochlorperazine (COMPAZINE) 10 MG tablet Take 1 tablet (10 mg total) by mouth every 6 (six) hours  as needed for nausea or vomiting. 30 tablet 0   tirzepatide (MOUNJARO) 5 MG/0.5ML Pen Inject 5 mg into the skin once a week. 6 mL 0   traZODone (DESYREL) 50 MG tablet Take 50 mg by mouth at bedtime as needed for sleep.     Blood Glucose Monitoring Suppl DEVI 1 each by Does not apply route in the morning, at noon, and at bedtime. May substitute to any manufacturer covered by patient's insurance. 1 each 0   Blood Pressure Monitoring (CARETOUCH BP ARM MONITOR) DEVI Use to monitor bp twice daily 1 each 0   buPROPion (WELLBUTRIN XL) 150 MG 24 hr tablet Take 1 tablet (150 mg total) by mouth daily. (Patient not taking: Reported on 05/14/2023) 1 tablet 0   COLLAGEN PO Take by mouth.     hydrocortisone (ANUSOL-HC) 25 MG suppository Place 25 mg rectally 2 (two) times daily as needed. (Patient not taking: Reported on 05/13/2023)     hydrOXYzine (VISTARIL) 25 MG capsule Take 1 capsule (25 mg total) by mouth at bedtime as needed. (Patient taking differently: Take 25 mg by mouth every 8 (eight) hours as needed for anxiety.)       Discharge Medications: Please see discharge summary for a list of discharge medications.  Relevant Imaging Results:  Relevant Lab Results:   Additional Information SS #: [id number]  Zameer Borman E Ailin Rochford, LCSW

## 2023-06-01 ENCOUNTER — Telehealth: Payer: Self-pay | Admitting: Family Medicine

## 2023-06-01 ENCOUNTER — Encounter: Payer: Self-pay | Admitting: *Deleted

## 2023-06-01 ENCOUNTER — Other Ambulatory Visit: Payer: HMO

## 2023-06-01 NOTE — NC FL2 (Signed)
[place] MEDICAID FL2 LEVEL OF CARE FORM     IDENTIFICATION  Patient Name: Ashley Mccullough Birthdate: 10/24/1960 Sex: female Admission Date (Current Location): 05/29/2023  [facility] and [place] Number:  Chiropodist and Address:  [facility], [address], [place], [place] [address]      Provider Number: [id number]  Attending Physician Name and Address:  Pilar Jarvis, MD  Relative Name and Phone Number:  Diana Eves (Sister)  [phone] [facility])    Current Level of Care: Hospital Recommended Level of Care: Skilled Nursing Facility Prior Approval Number:    Date Approved/Denied:   PASRR Number: *pending*  Discharge Plan:      Current Diagnoses: Patient Active Problem List   Diagnosis Date Noted   Hypertensive kidney disease with stage 4 chronic kidney disease (HCC) 05/13/2023   Impaired mobility and activities of daily living 05/13/2023   Other chronic pain 05/13/2023   Depression, recurrent (HCC) 05/13/2023   Microalbuminuria 01/05/2023   Iron deficiency anemia 10/25/2022   Self-care deficit for feeding, bathing, and toileting 10/20/2022   Personal history of fall 10/20/2022   Chronic back pain 10/20/2022   Ambulatory dysfunction 10/20/2022   OSA (obstructive sleep apnea) 10/20/2022   Symptomatic anemia 10/20/2022   Hematochezia 10/20/2022   Panniculitis 06/02/2022   Need for influenza vaccination 06/02/2022   Mixed hyperlipidemia 06/02/2022   Anxiety 06/02/2022   Annual physical exam 10/17/2021   CKD (chronic kidney disease) stage 4, GFR 15-29 ml/min (HCC) 06/06/2021   Hypersomnia 06/05/2021   Lumbar radiculitis 06/05/2021   Encounter for screening mammogram for malignant neoplasm of breast 06/05/2021   Chronic pain syndrome 04/11/2021   Bilateral primary osteoarthritis of knee 04/11/2021   Lumbar spondylosis 04/11/2021   Sacroiliac joint pain 04/11/2021   Chronic pain of both knees 06/21/2020   Chronic  radicular lumbar pain 11/18/2019   Spinal stenosis of lumbar region with neurogenic claudication 11/18/2019   DDD (degenerative disc disease), lumbosacral 11/18/2019   BMI 60.0-69.9, adult (HCC) 07/01/2017   Class 3 severe obesity with body mass index (BMI) of 60.0 to 69.9 in adult (HCC) 12/24/2015   Anxiety, generalized 05/29/2015   D (diarrhea) 05/29/2015   Avitaminosis D 05/29/2015   Prediabetes 05/29/2015   Fatigue 05/29/2015   Depression 03/15/2015   Cannot sleep 11/13/2009   Congenital pes planus 03/13/2008   Hypercholesterolemia without hypertriglyceridemia 09/16/2006    Orientation RESPIRATION BLADDER Height & Weight     Self, Time, Situation, Place  Normal External catheter Weight: 285 lb 15 oz (129.7 kg) Height:  5\' 3"  (160 cm)  BEHAVIORAL SYMPTOMS/MOOD NEUROLOGICAL BOWEL NUTRITION STATUS        Diet (carb modified)  AMBULATORY STATUS COMMUNICATION OF NEEDS Skin   Extensive Assist Verbally                         Personal Care Assistance Level of Assistance  Bathing, Feeding, Dressing Bathing Assistance: Maximum assistance Feeding assistance: Maximum assistance Dressing Assistance: Maximum assistance     Functional Limitations Info             SPECIAL CARE FACTORS FREQUENCY  PT (By licensed PT), OT (By licensed OT)     PT Frequency: 5 times per week OT Frequency: 5 times per week            Contractures      Additional Factors Info  Code Status, Allergies Code Status Info: full Allergies Info: Cashew Nut Oil, Latex, Norvasc (  Amlodipine), Simvastatin, Sulfa Antibiotics, Levofloxacin, Prednisone           Current Medications (06/01/2023):  This is the current hospital active medication list Current Facility-Administered Medications  Medication Dose Route Frequency Provider Last Rate Last Admin   acetaminophen (TYLENOL) tablet 650 mg  650 mg Oral QHS Trinna Post, MD   650 mg at 05/31/23 2309   calcitRIOL (ROCALTROL) capsule 0.25 mcg  0.25 mcg  Oral Daily Ray, Neha, MD   0.25 mcg at 06/01/23 1058   FLUoxetine (PROZAC) capsule 60 mg  60 mg Oral Daily Ray, Danie Binder, MD   60 mg at 06/01/23 1053   gabapentin (NEURONTIN) capsule 600 mg  600 mg Oral TID Trinna Post, MD   600 mg at 06/01/23 1052   hydrALAZINE (APRESOLINE) tablet 50 mg  50 mg Oral TID Trinna Post, MD   50 mg at 06/01/23 1053   HYDROcodone-acetaminophen (NORCO) 7.5-325 MG per tablet 2 tablet  2 tablet Oral BID Trinna Post, MD   2 tablet at 06/01/23 1052   hydrOXYzine (VISTARIL) capsule 25 mg  25 mg Oral Q8H PRN Trinna Post, MD       ibuprofen (ADVIL) tablet 600 mg  600 mg Oral Q8H PRN Menshew, Jenise V Bacon, PA-C       loratadine (CLARITIN) tablet 10 mg  10 mg Oral Daily Ray, Neha, MD   10 mg at 06/01/23 1052   metoprolol succinate (TOPROL-XL) 24 hr tablet 25 mg  25 mg Oral Daily Ray, Neha, MD   25 mg at 06/01/23 1052   montelukast (SINGULAIR) tablet 10 mg  10 mg Oral Daily Ray, Danie Binder, MD   10 mg at 06/01/23 1052   polyethylene glycol (MIRALAX / GLYCOLAX) packet 17 g  17 g Oral BID Trinna Post, MD   17 g at 06/01/23 1054   prochlorperazine (COMPAZINE) tablet 10 mg  10 mg Oral Q6H PRN Trinna Post, MD       traZODone (DESYREL) tablet 50 mg  50 mg Oral QHS PRN Trinna Post, MD   50 mg at 05/31/23 2308   Current Outpatient Medications  Medication Sig Dispense Refill   acetaminophen (TYLENOL) 650 MG CR tablet Take 650-1,300 mg by mouth at bedtime. As needed for arthritis.     calcitRIOL (ROCALTROL) 0.25 MCG capsule TAKE 1 CAPSULE BY MOUTH 1 TIME EACH DAY 90 capsule 3   Cetirizine HCl 10 MG CAPS Take 1 capsule by mouth daily.     FLUoxetine (PROZAC) 20 MG capsule Take 3 capsules (60 mg total) by mouth daily. Take 3 capsules daily. 270 capsule 3   gabapentin (NEURONTIN) 300 MG capsule Take 2 capsules (600 mg total) by mouth 3 (three) times daily. 180 capsule 11   hydrALAZINE (APRESOLINE) 50 MG tablet Take 1 tablet (50 mg total) by mouth 3 (three) times daily. 270 tablet 3   HYDROcodone-acetaminophen  (NORCO) 7.5-325 MG tablet Take 2 tablets by mouth in the morning and at bedtime. 120 tablet 0   losartan (COZAAR) 100 MG tablet Take 1 tablet (100 mg total) by mouth daily. 90 tablet 3   metoprolol succinate (TOPROL-XL) 25 MG 24 hr tablet TAKE 1 TABLET BY MOUTH DAILY 90 tablet 0   montelukast (SINGULAIR) 10 MG tablet Take 1 tablet (10 mg total) by mouth daily. 90 tablet 1   polyethylene glycol (MIRALAX / GLYCOLAX) 17 g packet Take 17 g by mouth 2 (two) times daily. 14 each 0   prochlorperazine (COMPAZINE) 10 MG tablet Take 1 tablet (10  mg total) by mouth every 6 (six) hours as needed for nausea or vomiting. 30 tablet 0   tirzepatide (MOUNJARO) 5 MG/0.5ML Pen Inject 5 mg into the skin once a week. 6 mL 0   traZODone (DESYREL) 50 MG tablet Take 50 mg by mouth at bedtime as needed for sleep.     Blood Glucose Monitoring Suppl DEVI 1 each by Does not apply route in the morning, at noon, and at bedtime. May substitute to any manufacturer covered by patient's insurance. 1 each 0   Blood Pressure Monitoring (CARETOUCH BP ARM MONITOR) DEVI Use to monitor bp twice daily 1 each 0   buPROPion (WELLBUTRIN XL) 150 MG 24 hr tablet Take 1 tablet (150 mg total) by mouth daily. (Patient not taking: Reported on 05/14/2023) 1 tablet 0   COLLAGEN PO Take by mouth.     hydrocortisone (ANUSOL-HC) 25 MG suppository Place 25 mg rectally 2 (two) times daily as needed. (Patient not taking: Reported on 05/13/2023)     hydrOXYzine (VISTARIL) 25 MG capsule Take 1 capsule (25 mg total) by mouth at bedtime as needed. (Patient taking differently: Take 25 mg by mouth every 8 (eight) hours as needed for anxiety.)       Discharge Medications: Please see discharge summary for a list of discharge medications.  Relevant Imaging Results:  Relevant Lab Results:   Additional Information SS #: [id number]  Margarito Liner, LCSW

## 2023-06-01 NOTE — ED Notes (Addendum)
Pt given snack at this time, denies other needs

## 2023-06-01 NOTE — ED Notes (Signed)
Pt awake on personal phone, denies neeeds

## 2023-06-01 NOTE — Telephone Encounter (Signed)
Thank you. I contacted [name] and we were able to complete a conference call with Ms. Karilyn Cota.

## 2023-06-01 NOTE — ED Provider Notes (Signed)
-----------------------------------------   5:29 AM on 06/01/2023 -----------------------------------------   Blood pressure (!) 121/55, pulse 77, temperature 98.2 F (36.8 C), temperature source Oral, resp. rate 16, SpO2 96%.  The patient is calm and cooperative at this time.  There have been no acute events since the last update.  Awaiting disposition plan from Social Work team.   Irean Hong, MD 06/01/23 [phone]

## 2023-06-01 NOTE — TOC CM/SW Note (Cosign Needed)
RE: Ashley Mccullough Date of Birth:  03/06/1961 Date:06/01/23   To Whom It May Concern: Please be advised that the above-named patient will require a short-term nursing home stay - anticipated 30 days or less for rehabilitation and strengthening.  The plan is for return home.

## 2023-06-01 NOTE — ED Provider Notes (Signed)
Emergency Medicine Observation Re-evaluation Note  Ashley Mccullough is a 62 y.o. female, seen on rounds today.  Pt initially presented to the ED for complaints of Fall  Currently, the patient is resting in bed. No reported issues from nursing team.   Physical Exam  BP (!) 121/55   Pulse 77   Temp 98.2 F (36.8 C) (Oral)   Resp 16   Ht 5\' 3"  (1.6 m)   Wt 129.7 kg   SpO2 96%   BMI 50.65 kg/m  Physical Exam General: Resting in bed  ED Course / MDM   No labs last 24 hours  Plan  Current plan is for dispo per social work.    Trinna Post, MD 06/01/23 [phone]

## 2023-06-01 NOTE — ED Notes (Signed)
Pt resting in bed.

## 2023-06-01 NOTE — TOC Progression Note (Addendum)
Transition of Care [facility]) - Progression Note    Patient Details  Name: Ashley Mccullough MRN: [id number] Date of Birth: 1960/12/25  [facility]) CM/SW Contact  Margarito Liner, LCSW Phone Number: 06/01/2023, 1:28 PM  Clinical Narrative:  [facility] declined. CSW left message for [facility] admissions coordinator asking her to review. [facility] has offered a bed. Sister is aware.  2:14 pm: CSW uploaded requested documents into [place] [facility].  3:26 pm: PASARR obtained: [id number]. Expires 10/16. Patient and her sister have accepted the bed offer from [facility]. Admissions coordinator confirmed they would have a bed once auth obtained. CSW called [facility] and started [place].  Expected Discharge Plan: Skilled Nursing Facility Barriers to Discharge: Continued Medical Work up  Expected Discharge Plan and Services                                               Social Determinants of Health (SDOH) Interventions SDOH Screenings   Food Insecurity: No Food Insecurity (05/14/2023)  Housing: Low Risk  (10/21/2022)  Transportation Needs: No Transportation Needs (10/21/2022)  Utilities: Patient Declined (10/21/2022)  Alcohol Screen: Low Risk  (06/02/2022)  Depression (PHQ2-9): Medium Risk (05/13/2023)  Social Connections: Socially Isolated (03/04/2022)  Stress: No Stress Concern Present (03/04/2022)  Tobacco Use: Low Risk  (05/29/2023)    Readmission Risk Interventions     No data to display

## 2023-06-01 NOTE — Telephone Encounter (Signed)
Ashley Mccullough is calling in requesting to speak with [facility] regarding this pt. Pt is in the hospital at the moment and the hospital is looking for a nursing facility for the pt. Pt has been in the hospital since Friday. Please follow up with Christine at  [phone].

## 2023-06-01 NOTE — Patient Outreach (Signed)
Care Coordination   Collaboration  Visit Note   06/01/2023 Name: Ashley Mccullough MRN: [id number] DOB: 05/13/1961  Ashley Mccullough is a 62 y.o. year old female who sees Jacky Kindle, FNP for primary care. I  collaborated with RNCM regarding patient's transition to rehab and housing needs following rehab stay.  What matters to the patients health and wellness today? Short term rehab and accessible housing following rehab    Goals Addressed             This Visit's Progress    Care coordination activities       Interventions Today    Flowsheet Row Most Recent Value  Chronic Disease   Chronic disease during today's visit Other  [ambulatory limitations, need for short-term rehab]  General Interventions   General Interventions Discussed/Reviewed Communication with  Communication with RN  [patient's current medical status discussed and plan to discharge to a rehab facility-patient's lease is up at the end of October, accessible housing options needed. CSW will assist patient and family to identify available options]  Safety Interventions   Safety Discussed/Reviewed Safety Discussed              SDOH assessments and interventions completed:  Yes     Care Coordination Interventions:  Yes, provided   Follow up plan: Follow up call scheduled for 06/05/23    Encounter Outcome:  Patient Visit Completed

## 2023-06-01 NOTE — ED Notes (Signed)
Pericare provided on patient. Patient's purewick replaced. Care provided by this RN, and EDT [place].

## 2023-06-02 ENCOUNTER — Telehealth: Payer: Self-pay | Admitting: *Deleted

## 2023-06-02 NOTE — Patient Instructions (Signed)
Visit Information  Thank you for taking time to visit with me today. Please don't hesitate to contact me if I can be of assistance to you.   Following are the goals we discussed today:  Please submit applications for housing to [facility] and [facility] CSW will follow up on recommendations related to level of care post patient's rehab stay   Our next appointment is by telephone on 06/16/23 at 10am  Please call the care guide team at [phone] if you need to cancel or reschedule your appointment.   If you are experiencing a Mental Health or Behavioral Health Crisis or need someone to talk to, please call [phone]   Patient verbalizes understanding of instructions and care plan provided today and agrees to view in MyChart. Active MyChart status and patient understanding of how to access instructions and care plan via MyChart confirmed with patient.     Telephone follow up appointment with care management team member scheduled for: 06/16/23  Verna Czech, LCSW [place]  [facility], [facility] Licensed Clinical Social Worker Care Coordinator  Direct Dial: [phone]

## 2023-06-02 NOTE — ED Notes (Signed)
Pt asleep in bed.

## 2023-06-02 NOTE — Progress Notes (Signed)
Physical Therapy Treatment Patient Details Name: [name] MRN: [id number] DOB: 08-Feb-1961 Today's Date: 06/02/2023   History of Present Illness Illona Mccullough is a 62 y.o. female with a history of morbid obesity, anxiety, depression, spinal stenosis, bilateral sciatica, CKD, OSA, HLD, hypertension, presents to the ED via EMS from home.  Patient lives independently in a third floor apartment building for the last 18+ years.  She has poor mobility by her report, and had a fall today after she got up from the toilet to walk back to her bedroom.  The prolonged sitting caused aggravation of her sciatic nerve irritation and leg weakness.  She called EMS who transported the patient to the ED for evaluation.    PT Comments  Pt was long sitting in bed upon arrival. She is A and O x 4 and agreeable to session. Supportive caregiver at bedside. Caregiver only present with pt 3 days a week. Pt lives alone and currently is requiring assistance for all mobility, transfers, and gait. Pt was able to tolerate exiting R side of bed, standing to RW, an ambulating ~ 25 ft in rm. Distance limited by fatigue and pain. Acute PT will continue to follow and progress per current POC.    If plan is discharge home, recommend the following: Two people to help with walking and/or transfers;A lot of help with bathing/dressing/bathroom;Assistance with cooking/housework;Assist for transportation;Help with stairs or ramp for entrance   Can travel by private vehicle     No  Equipment Recommendations  None recommended by PT       Precautions / Restrictions Precautions Precautions: Fall Restrictions Weight Bearing Restrictions: No     Mobility  Bed Mobility Overal bed mobility: Needs Assistance Bed Mobility: Supine to Sit, Sit to Supine  Supine to sit: Min assist Sit to supine: Min assist     Transfers Overall transfer level: Needs assistance Equipment used: Rolling walker (2 wheels) Transfers: Sit  to/from Stand Sit to Stand: Contact guard assist, From elevated surface       Ambulation/Gait Ambulation/Gait assistance: Contact guard assist Gait Distance (Feet): 25 Feet Assistive device: Rolling walker (2 wheels) Gait Pattern/deviations: Step-through pattern Gait velocity: decreased  General Gait Details: Pt was able to ambulate ~ 25 ft in room with RW. no LOB however pt does endorse increase in pain in low back and shoulders. Overall tolerated well.    Balance Overall balance assessment: Needs assistance Sitting-balance support: Bilateral upper extremity supported, Feet unsupported Sitting balance-Leahy Scale: Good     Standing balance support: During functional activity, Bilateral upper extremity supported, Reliant on assistive device for balance Standing balance-Leahy Scale: Fair Standing balance comment: reliant on RW to stand, heavy forward lean on RW 2/2 back pain.        Cognition Arousal: Alert Behavior During Therapy: WFL for tasks assessed/performed Overall Cognitive Status: Within Functional Limits for tasks assessed      General Comments: pleasant and cooperative               Pertinent Vitals/Pain Pain Assessment Pain Assessment: 0-10 Pain Score: 2  Pain Location: R sided Lower back Pain Descriptors / Indicators: Discomfort Pain Intervention(s): Limited activity within patient's tolerance, Monitored during session, Premedicated before session     PT Goals (current goals can now be found in the care plan section) Acute Rehab PT Goals Patient Stated Goal: get better so I can go home Progress towards PT goals: Progressing toward goals    Frequency    Min 1X/week  Co-evaluation     PT goals addressed during session: Mobility/safety with mobility;Balance;Strengthening/ROM;Proper use of DME        AM-PAC PT "6 Clicks" Mobility   Outcome Measure  Help needed turning from your back to your side while in a flat bed without using  bedrails?: A Little Help needed moving from lying on your back to sitting on the side of a flat bed without using bedrails?: A Little Help needed moving to and from a bed to a chair (including a wheelchair)?: A Little Help needed standing up from a chair using your arms (e.g., wheelchair or bedside chair)?: A Little Help needed to walk in hospital room?: A Little Help needed climbing 3-5 steps with a railing? : Total 6 Click Score: 16    End of Session   Activity Tolerance: Patient tolerated treatment well Patient left: in bed;with call bell/phone within reach;with family/visitor present Nurse Communication: Mobility status PT Visit Diagnosis: Muscle weakness (generalized) (M62.81);History of falling (Z91.81);Difficulty in walking, not elsewhere classified (R26.2);Pain Pain - Right/Left: Right Pain - part of body: Leg     Time: 6295-2841 PT Time Calculation (min) (ACUTE ONLY): 15 min  Charges:    $Gait Training: 8-22 mins PT General Charges $$ ACUTE PT VISIT: 1 Visit                     Jetta Lout PTA 06/02/23, 11:44 AM

## 2023-06-02 NOTE — ED Provider Notes (Signed)
-----------------------------------------   6:03 AM on 06/02/2023 -----------------------------------------   Blood pressure (!) 146/57, pulse 76, temperature 98.4 F (36.9 C), temperature source Oral, resp. rate 16, height 5\' 3"  (1.6 m), weight 129.7 kg, SpO2 97%.  The patient is calm and cooperative at this time.  There have been no acute events since the last update.  Awaiting disposition plan from Social Work team.   Irean Hong, MD 06/02/23 [phone]

## 2023-06-02 NOTE — ED Notes (Signed)
Patient in bed resting. Tolerated po meds well. No c/o or concerns voiced at this time.

## 2023-06-02 NOTE — ED Notes (Signed)
Pt asleep 

## 2023-06-02 NOTE — Patient Outreach (Signed)
Care Coordination   Follow Up Visit Note   06/02/2023 Name: Ashley Mccullough MRN: [id number] DOB: 08/02/1961  Ashley Dus Daun is a 62 y.o. year old female who sees Jacky Kindle, FNP for primary care. I spoke with  Ashley Mccullough's aunt by phone today.  What matters to the patients health and wellness today?  Patient's aunt requesting assistance with accessible housing for patient. Pending applications: [facility] [phone] left VM for status, [facility] [phone], left VM for status, [facility] [phone] #7 on the waiting list-Aunt to submit applications to [facility] and [facility]    Goals Addressed             This Visit's Progress    Care coordination activities       Interventions Today    Flowsheet Row Most Recent Value  Chronic Disease   Chronic disease during today's visit Other  [ambulatory limitations, need for rehab and acessible housing]  General Interventions   General Interventions Discussed/Reviewed General Interventions Discussed, [facility], Level of Care  Level of Care Skilled Nursing Facility  [Pt to transfer to rehab post DC from the hospital, long term plan of Independent Living discussed-applications pending-aunt will submit additional applications-will await status of patient's rehab for final recommendations regarding level of care]              SDOH assessments and interventions completed:  Yes  SDOH Interventions Today    Flowsheet Row Most Recent Value  SDOH Interventions   Food Insecurity Interventions Intervention Not Indicated  Housing Interventions Intervention Not Indicated, Other (Comment)  [will need assistance with finding an acessible apartment]  Transportation Interventions Intervention Not Indicated  Utilities Interventions Intervention Not Indicated        Care Coordination Interventions:  Yes, provided   Follow up plan: Follow up call scheduled for 06/16/23    Encounter  Outcome:  Patient Visit Completed

## 2023-06-02 NOTE — TOC Progression Note (Signed)
Transition of Care [facility]) - Progression Note    Patient Details  Name: Ashley Mccullough MRN: [id number] Date of Birth: [birth year]  [facility]) CM/SW Contact  Margarito Liner, LCSW Phone Number: 06/02/2023, 8:06 AM  Clinical Narrative:  SNF insurance authorization is still pending.   Expected Discharge Plan: Skilled Nursing Facility Barriers to Discharge: Continued Medical Work up  Expected Discharge Plan and Services                                               Social Determinants of Health (SDOH) Interventions SDOH Screenings   Food Insecurity: No Food Insecurity (05/14/2023)  Housing: Low Risk  (10/21/2022)  Transportation Needs: No Transportation Needs (10/21/2022)  Utilities: Patient Declined (10/21/2022)  Alcohol Screen: Low Risk  (06/02/2022)  Depression (PHQ2-9): Medium Risk (05/13/2023)  Social Connections: Socially Isolated (03/04/2022)  Stress: No Stress Concern Present (03/04/2022)  Tobacco Use: Low Risk  (05/29/2023)    Readmission Risk Interventions     No data to display

## 2023-06-02 NOTE — ED Notes (Signed)
Pt asleep.

## 2023-06-02 NOTE — ED Notes (Signed)
Pt resting denies needs

## 2023-06-02 NOTE — ED Notes (Signed)
Patient called out using call bell system, pure wick cannister emptied per patient request.

## 2023-06-03 ENCOUNTER — Encounter: Payer: Self-pay | Admitting: Family Medicine

## 2023-06-03 ENCOUNTER — Other Ambulatory Visit: Payer: Self-pay | Admitting: Family Medicine

## 2023-06-03 ENCOUNTER — Telehealth: Payer: Self-pay | Admitting: *Deleted

## 2023-06-03 ENCOUNTER — Encounter: Payer: Self-pay | Admitting: *Deleted

## 2023-06-03 DIAGNOSIS — E119 Type 2 diabetes mellitus without complications: Secondary | ICD-10-CM | POA: Diagnosis not present

## 2023-06-03 DIAGNOSIS — R52 Pain, unspecified: Secondary | ICD-10-CM | POA: Diagnosis not present

## 2023-06-03 DIAGNOSIS — M48062 Spinal stenosis, lumbar region with neurogenic claudication: Secondary | ICD-10-CM

## 2023-06-03 DIAGNOSIS — R2681 Unsteadiness on feet: Secondary | ICD-10-CM | POA: Diagnosis not present

## 2023-06-03 DIAGNOSIS — K649 Unspecified hemorrhoids: Secondary | ICD-10-CM | POA: Diagnosis not present

## 2023-06-03 DIAGNOSIS — J309 Allergic rhinitis, unspecified: Secondary | ICD-10-CM | POA: Diagnosis not present

## 2023-06-03 DIAGNOSIS — E569 Vitamin deficiency, unspecified: Secondary | ICD-10-CM | POA: Diagnosis not present

## 2023-06-03 DIAGNOSIS — M4800 Spinal stenosis, site unspecified: Secondary | ICD-10-CM | POA: Diagnosis not present

## 2023-06-03 DIAGNOSIS — K648 Other hemorrhoids: Secondary | ICD-10-CM | POA: Diagnosis not present

## 2023-06-03 DIAGNOSIS — K5900 Constipation, unspecified: Secondary | ICD-10-CM | POA: Diagnosis not present

## 2023-06-03 DIAGNOSIS — G8929 Other chronic pain: Secondary | ICD-10-CM

## 2023-06-03 DIAGNOSIS — M48061 Spinal stenosis, lumbar region without neurogenic claudication: Secondary | ICD-10-CM | POA: Diagnosis not present

## 2023-06-03 DIAGNOSIS — I10 Essential (primary) hypertension: Secondary | ICD-10-CM | POA: Diagnosis not present

## 2023-06-03 DIAGNOSIS — M6281 Muscle weakness (generalized): Secondary | ICD-10-CM | POA: Diagnosis not present

## 2023-06-03 DIAGNOSIS — M5416 Radiculopathy, lumbar region: Secondary | ICD-10-CM | POA: Diagnosis not present

## 2023-06-03 DIAGNOSIS — R110 Nausea: Secondary | ICD-10-CM | POA: Diagnosis not present

## 2023-06-03 DIAGNOSIS — M6259 Muscle wasting and atrophy, not elsewhere classified, multiple sites: Secondary | ICD-10-CM | POA: Diagnosis not present

## 2023-06-03 DIAGNOSIS — Z743 Need for continuous supervision: Secondary | ICD-10-CM | POA: Diagnosis not present

## 2023-06-03 DIAGNOSIS — Z789 Other specified health status: Secondary | ICD-10-CM

## 2023-06-03 DIAGNOSIS — Z6841 Body Mass Index (BMI) 40.0 and over, adult: Secondary | ICD-10-CM | POA: Diagnosis not present

## 2023-06-03 DIAGNOSIS — G4700 Insomnia, unspecified: Secondary | ICD-10-CM | POA: Diagnosis not present

## 2023-06-03 DIAGNOSIS — R262 Difficulty in walking, not elsewhere classified: Secondary | ICD-10-CM | POA: Diagnosis not present

## 2023-06-03 DIAGNOSIS — D649 Anemia, unspecified: Secondary | ICD-10-CM | POA: Diagnosis not present

## 2023-06-03 DIAGNOSIS — F32A Depression, unspecified: Secondary | ICD-10-CM | POA: Diagnosis not present

## 2023-06-03 DIAGNOSIS — N186 End stage renal disease: Secondary | ICD-10-CM | POA: Diagnosis not present

## 2023-06-03 DIAGNOSIS — S39012A Strain of muscle, fascia and tendon of lower back, initial encounter: Secondary | ICD-10-CM | POA: Diagnosis not present

## 2023-06-03 DIAGNOSIS — R531 Weakness: Secondary | ICD-10-CM | POA: Diagnosis not present

## 2023-06-03 DIAGNOSIS — F338 Other recurrent depressive disorders: Secondary | ICD-10-CM | POA: Diagnosis not present

## 2023-06-03 DIAGNOSIS — M5430 Sciatica, unspecified side: Secondary | ICD-10-CM | POA: Diagnosis not present

## 2023-06-03 MED ORDER — HYDROCODONE-ACETAMINOPHEN 7.5-325 MG PO TABS: 2.0000 | ORAL_TABLET | Freq: Two times a day (BID) | ORAL | 0 refills | Status: DC | PRN

## 2023-06-03 NOTE — Patient Outreach (Deleted)
Care Coordination   Collaboration  Visit Note   06/03/2023 Name: Ashley Mccullough MRN: [id number] DOB: October 08, 1960  Ashley Mccullough is a 62 y.o. year old female who sees Ashley Kindle, FNP for primary care. I  ***  What matters to the patients health and wellness today?  ***    Goals Addressed   None     SDOH assessments and interventions completed:  {yes/no:20286}{THN Tip this will not be part of the note when signed-REQUIRED REPORT FIELD DO NOT DELETE (Optional):27901}     Care Coordination Interventions:  {INTERVENTIONS:27767} {THN Tip this will not be part of the note when signed-REQUIRED REPORT FIELD DO NOT DELETE (Optional):27901}  Follow up plan: {CCFOLLOWUP:27768}   Encounter Outcome:  {ENCOUTCOME:27770} {THN Tip this will not be part of the note when signed-REQUIRED REPORT FIELD DO NOT DELETE (Optional):27901}

## 2023-06-03 NOTE — Patient Outreach (Addendum)
Care Coordination   Collaboration  Visit Note   06/03/2023 Name: Ashley Mccullough MRN: [id number] DOB: 1961/08/06  Ashley Mccullough is a 62 y.o. year old female who sees Ashley Kindle, FNP for primary care. I  spoke with the manager of [facility]  What matters to the patients health and wellness today?  Housing in independent living for Seniors with Disabilities   Goals Addressed             This Visit's Progress    Care coordination activities       Interventions Today    Flowsheet Row Most Recent Value  Chronic Disease   Chronic disease during today's visit Other  [ambulatory limitations]  General Interventions   General Interventions Discussed/Reviewed Communication with  Communication with --  [Ashley Mccullough-confirmed that patient is currently on the waiting list- "first come first serve" strict protocol to be followed due to government housing designation-pt's aunt to be notified]              SDOH assessments and interventions completed:  No     Care Coordination Interventions:  Yes, provided   Follow up plan: Follow up call scheduled for 06/05/23    Encounter Outcome:  Patient Visit Completed

## 2023-06-03 NOTE — ED Notes (Signed)
Patient sleeping, purewick cannister emptied. Will medicate per MAR.

## 2023-06-03 NOTE — TOC Progression Note (Addendum)
Transition of Care [facility]) - Progression Note    Patient Details  Name: Ashley Mccullough MRN: [id number] Date of Birth: 09-Jul-1961  Transition of [facility]) CM/SW Contact  Margarito Liner, LCSW Phone Number: 06/03/2023, 9:06 AM  Clinical Narrative:  CSW left voicemail for [employer] to check auth status.   9:27 am: [facility] called back and confirmed they are still waiting on a determination.  12:02 pm: HTA is offering a peer-to-peer review. Sent details to MD. CSW called and updated patient's sister.  1:58 pm: Per MD, [facility] will only approve SNF if patient is willing to go move into an ALF rehab. Patient stated she cannot afford an ALF. She did say she's actively working on getting into a first-floor apartment through the housing authority. She wants to see if this would be an alternative that HTA would be willing to approve for. CSW left voicemail for [employer].  3:45 pm: Received call back from Tammy with [facility]. She will discuss with Dr. Logan Bores and follow back up.  Expected Discharge Plan: Skilled Nursing Facility Barriers to Discharge: Continued Medical Work up  Expected Discharge Plan and Services                                               Social Determinants of Health (SDOH) Interventions SDOH Screenings   Food Insecurity: No Food Insecurity (06/02/2023)  Housing: Low Risk  (06/02/2023)  Transportation Needs: No Transportation Needs (06/02/2023)  Utilities: Not At Risk (06/02/2023)  Alcohol Screen: Low Risk  (06/02/2022)  Depression (PHQ2-9): Medium Risk (05/13/2023)  Social Connections: Socially Isolated (03/04/2022)  Stress: No Stress Concern Present (03/04/2022)  Tobacco Use: Low Risk  (05/29/2023)    Readmission Risk Interventions     No data to display

## 2023-06-03 NOTE — Progress Notes (Signed)
Occupational Therapy Treatment Patient Details Name: Ashley Mccullough MRN: [id number] DOB: 02/11/1961 Today's Date: 06/03/2023   History of present illness Ashley Mccullough is a 62 y.o. female with a history of morbid obesity, anxiety, depression, spinal stenosis, bilateral sciatica, CKD, OSA, HLD, hypertension, presents to the ED via EMS from home.  Patient lives independently in a third floor apartment building for the last 18+ years.  She has poor mobility by her report, and had a fall today after she got up from the toilet to walk back to her bedroom.  The prolonged sitting caused aggravation of her sciatic nerve irritation and leg weakness.  She called EMS who transported the patient to the ED for evaluation.   OT comments  Upon entering the room, pt supine in bed and agreeable to OT intervention. Pt reports need or BM. She performs bed mobility with min A and is able to get on her crocs while seated on EOB. Pt stands with min guard from standard bed height and ambulates 15' with RW and CGA to [facility]. Pt having BM and needs assistance for hygiene and clothing management. She reports feeling nauseated and RN notified for medication. OT did discuss recommendation for bidet for home in order to increase ease of hygiene. Pt returning to bed in same manner as above. Sit >supine with mod A for B LEs and trunk. Lab arriving at this time. Call bell and all needed items within reach.       If plan is discharge home, recommend the following:  Two people to help with walking and/or transfers;A lot of help with bathing/dressing/bathroom;Assistance with cooking/housework;Assist for transportation;Help with stairs or ramp for entrance   Equipment Recommendations  Other (comment) (defer to next venue of care)       Precautions / Restrictions Precautions Precautions: Fall       Mobility Bed Mobility Overal bed mobility: Needs Assistance Bed Mobility: Supine to Sit, Sit to Supine     Supine to  sit: Min assist Sit to supine: Mod assist        Transfers Overall transfer level: Needs assistance Equipment used: Rolling walker (2 wheels) Transfers: Sit to/from Stand Sit to Stand: Min assist                 Balance Overall balance assessment: Needs assistance Sitting-balance support: Bilateral upper extremity supported, Feet unsupported Sitting balance-Leahy Scale: Good     Standing balance support: During functional activity, Bilateral upper extremity supported, Reliant on assistive device for balance Standing balance-Leahy Scale: Fair                             ADL either performed or assessed with clinical judgement   ADL Overall ADL's : Needs assistance/impaired                         Toilet Transfer: Minimal assistance;BSC/3in1;Rolling walker (2 wheels)   Toileting- Clothing Manipulation and Hygiene: Moderate assistance;Sit to/from stand Toileting - Clothing Manipulation Details (indicate cue type and reason): assistance with hygiene and clothing management            Extremity/Trunk Assessment Upper Extremity Assessment Upper Extremity Assessment: Generalized weakness   Lower Extremity Assessment Lower Extremity Assessment: Generalized weakness        Vision Patient Visual Report: No change from baseline            Cognition Arousal: Alert Behavior During Therapy:  WFL for tasks assessed/performed Overall Cognitive Status: Within Functional Limits for tasks assessed                                 General Comments: pleasant and cooperative                   Pertinent Vitals/ Pain       Pain Assessment Pain Assessment: Faces Faces Pain Scale: Hurts a little bit Pain Location: R sided Lower back Pain Descriptors / Indicators: Discomfort Pain Intervention(s): Limited activity within patient's tolerance, Monitored during session, Premedicated before session, Repositioned         Frequency   Min 1X/week        Progress Toward Goals  OT Goals(current goals can now be found in the care plan section)  Progress towards OT goals: Progressing toward goals      AM-PAC OT "6 Clicks" Daily Activity     Outcome Measure   Help from another person eating meals?: A Little Help from another person taking care of personal grooming?: A Little Help from another person toileting, which includes using toliet, bedpan, or urinal?: A Lot Help from another person bathing (including washing, rinsing, drying)?: A Lot Help from another person to put on and taking off regular upper body clothing?: A Little Help from another person to put on and taking off regular lower body clothing?: A Lot 6 Click Score: 15    End of Session Equipment Utilized During Treatment: Rolling walker (2 wheels);Other (comment) (BSC)  OT Visit Diagnosis: Other abnormalities of gait and mobility (R26.89);Muscle weakness (generalized) (M62.81);Pain Pain - Right/Left: Right   Activity Tolerance Patient tolerated treatment well   Patient Left in bed;with call bell/phone within reach   Nurse Communication Mobility status        Time: 9147-8295 OT Time Calculation (min): 25 min  Charges: OT General Charges $OT Visit: 1 Visit OT Treatments $Self Care/Home Management : 23-37 mins  Jackquline Denmark, MS, OTR/L , CBIS ascom [phone]  06/03/23, 4:26 PM

## 2023-06-03 NOTE — ED Provider Notes (Signed)
-----------------------------------------   5:40 AM on 06/03/2023 -----------------------------------------   Blood pressure 123/60, pulse 69, temperature 98.2 F (36.8 C), temperature source Oral, resp. rate 18, height 5\' 3"  (1.6 m), weight 129.7 kg, SpO2 98%.  The patient is calm and cooperative at this time.  There have been no acute events since the last update.  Awaiting disposition plan from case management/social work.    Gillie Crisci, Layla Maw, DO 06/03/23 [phone]

## 2023-06-03 NOTE — ED Provider Notes (Signed)
Reviewed most recent notes, labs, social work disposition planning for discharge.  Discharge.   Pilar Jarvis, MD 06/03/23 [phone]

## 2023-06-03 NOTE — TOC Transition Note (Signed)
Transition of Care [facility]) - CM/SW Discharge Note   Patient Details  Name: Ashley Mccullough MRN: [id number] Date of Birth: 08-31-1961  Transition of Care [facility]) CM/SW Contact:  Margarito Liner, LCSW Phone Number: 06/03/2023, 4:30 PM   Clinical Narrative:  Insurance authorization is approved for [facility] SN[facility] [id number]) and EMS transport [phone]). RN will call report to [phone] (Room 611B). Nurse secretary will set up EMS transport. Patient is aware that initial authorization is only valid for 7 days and [employer] will not approve longer than 14 days. No further concerns. CSW signing off.   Final next level of care: Skilled Nursing Facility Barriers to Discharge: Barriers Resolved   Patient Goals and CMS Choice   Choice offered to / list presented to : Patient, Sibling  Discharge Placement PASRR number recieved: 06/01/23 PASRR number recieved: 06/01/23            Patient chooses bed at: [facility] Patient to be transferred to facility by: EMS Name of family member notified: Left voicemail for sister Arrie Eastern Patient and family notified of of transfer: 06/03/23  Discharge Plan and Services Additional resources added to the After Visit Summary for                                       Social Determinants of Health (SDOH) Interventions SDOH Screenings   Food Insecurity: No Food Insecurity (06/02/2023)  Housing: Low Risk  (06/02/2023)  Transportation Needs: No Transportation Needs (06/02/2023)  Utilities: Not At Risk (06/02/2023)  Alcohol Screen: Low Risk  (06/02/2022)  Depression (PHQ2-9): Medium Risk (05/13/2023)  Social Connections: Socially Isolated (03/04/2022)  Stress: No Stress Concern Present (03/04/2022)  Tobacco Use: Low Risk  (05/29/2023)     Readmission Risk Interventions     No data to display

## 2023-06-03 NOTE — Progress Notes (Signed)
This encounter was created in error - please disregard.

## 2023-06-04 DIAGNOSIS — F32A Depression, unspecified: Secondary | ICD-10-CM | POA: Diagnosis not present

## 2023-06-04 NOTE — Telephone Encounter (Signed)
Can you revise my referral with this number, [phone]

## 2023-06-05 ENCOUNTER — Ambulatory Visit: Payer: Self-pay | Admitting: *Deleted

## 2023-06-05 NOTE — Patient Outreach (Signed)
Care Coordination   Follow Up Visit Note   06/05/2023 Name: Aleria Alexanian MRN: [id number] DOB: 07/24/1961  Maury Dus Scritchfield is a 62 y.o. year old female who sees Jacky Kindle, FNP for primary care. I spoke with  Berneice Heinrich by phone today.  What matters to the patients health and wellness today?  Housing options post discharge from rehab    Goals Addressed             This Visit's Progress    Senior Housing Resources       Interventions Today    Flowsheet Row Most Recent Value  Chronic Disease   Chronic disease during today's visit Other  [ambulatory limitations, currently in rehab]  General Interventions   General Interventions Discussed/Reviewed General Interventions Reviewed, [facility], Level of Care  Level of Care Skilled Nursing Facility  [pt now in rehab at [facility]-housing options continue to be reviewed post discharge-senior housing continues to be prioirty, aunt to follow up up status of applications submitted-CSW to follow up with therapy recommendations]              SDOH assessments and interventions completed:  No     Care Coordination Interventions:  Yes, provided   Follow up plan: Follow up call scheduled for 06/19/23    Encounter Outcome:  Patient Visit Completed

## 2023-06-05 NOTE — Patient Instructions (Signed)
Visit Information  Thank you for taking time to visit with me today. Please don't hesitate to contact me if I can be of assistance to you.   Following are the goals we discussed today:  Patient's aunt will follow up on submitted applications to senior housing CSW to follow up with [facility] regarding therapy recommendations post discharge from the facility    Our next appointment is by telephone on 06/19/23 at 11am  Please call the care guide team at [phone] if you need to cancel or reschedule your appointment.   If you are experiencing a Mental Health or Behavioral Health Crisis or need someone to talk to, please call 911   Patient verbalizes understanding of instructions and care plan provided today and agrees to view in MyChart. Active MyChart status and patient understanding of how to access instructions and care plan via MyChart confirmed with patient.     Telephone follow up appointment with care management team member scheduled for: 06/19/23  Verna Czech, LCSW [place]  [facility], [facility] Licensed Clinical Social Worker Care Coordinator  Direct Dial: [phone]

## 2023-06-08 DIAGNOSIS — M4800 Spinal stenosis, site unspecified: Secondary | ICD-10-CM | POA: Diagnosis not present

## 2023-06-08 DIAGNOSIS — M6281 Muscle weakness (generalized): Secondary | ICD-10-CM | POA: Diagnosis not present

## 2023-06-08 DIAGNOSIS — M5430 Sciatica, unspecified side: Secondary | ICD-10-CM | POA: Diagnosis not present

## 2023-06-09 ENCOUNTER — Telehealth: Payer: Self-pay | Admitting: *Deleted

## 2023-06-09 NOTE — Patient Instructions (Signed)
Visit Information  Thank you for taking time to visit with me today. Please don't hesitate to contact me if I can be of assistance to you.   Following are the goals we discussed today:  Please contact this social worker when confirmation    Our next appointment is by telephone on 06/19/23 at 11am  Please call the care guide team at [phone] if you need to cancel or reschedule your appointment.   If you are experiencing a Mental Health or Behavioral Health Crisis or need someone to talk to, please call 911   Patient verbalizes understanding of instructions and care plan provided today and agrees to view in MyChart. Active MyChart status and patient understanding of how to access instructions and care plan via MyChart confirmed with patient.     Telephone follow up appointment with care management team member scheduled for: 07/09/23  Verna Czech, LCSW [place]  [facility], [facility] Licensed Clinical Social Worker Care Coordinator  Direct Dial: [phone]

## 2023-06-09 NOTE — Patient Outreach (Signed)
Care Coordination   Follow Up Visit Note   06/09/2023 Name: Ashley Mccullough MRN: [id number] DOB: [birth year]  Ashley Mccullough is a 62 y.o. year old female who sees Ashley Kindle, FNP for primary care. I spoke with  Ashley Mccullough by phone today.  What matters to the patients health and wellness today?  Accessible housing-Patient's aunt confirms that patient's sister has potentially identified accessible housing for patient  in [place].  Goals Addressed             This Visit's Progress    Senior Housing Resources       Interventions Today    Flowsheet Row Most Recent Value  Chronic Disease   Chronic disease during today's visit --  [ambulatory limitations, currently in rehab]  General Interventions   General Interventions Discussed/Reviewed General Interventions Reviewed, Level of Care  Level of Care Skilled Nursing Facility  [patient remains in rehab at this time, per patient's aunt a accessible apartment on the first floor may have been identified by patient's sister in [place]-aunt to confirm as soon as the paperwork is finalized]              SDOH assessments and interventions completed:  No     Care Coordination Interventions:  Yes, provided   Follow up plan: Follow up call scheduled for 06/19/23    Encounter Outcome:  Patient Visit Completed

## 2023-06-10 DIAGNOSIS — F338 Other recurrent depressive disorders: Secondary | ICD-10-CM | POA: Diagnosis not present

## 2023-06-10 DIAGNOSIS — G4700 Insomnia, unspecified: Secondary | ICD-10-CM | POA: Diagnosis not present

## 2023-06-10 DIAGNOSIS — K648 Other hemorrhoids: Secondary | ICD-10-CM | POA: Diagnosis not present

## 2023-06-11 ENCOUNTER — Telehealth: Payer: Self-pay | Admitting: *Deleted

## 2023-06-11 NOTE — Patient Instructions (Signed)
Visit Information  Thank you for taking time to visit with me today. Please don't hesitate to contact me if I can be of assistance to you.   Following are the goals we discussed today:  Patient's aunt to continue to follow up with staff regarding patient's status in rehab Patient's aunt to follow up with patient's provider with any questions regarding patient's medical care Patient's family to follow up in status of application for accessible housin   Our next appointment is by telephone on 06/19/23 at 11am  Please call the care guide team at [phone] if you need to cancel or reschedule your appointment.   If you are experiencing a Mental Health or Behavioral Health Crisis or need someone to talk to, please call 911   Patient verbalizes understanding of instructions and care plan provided today and agrees to view in MyChart. Active MyChart status and patient understanding of how to access instructions and care plan via MyChart confirmed with patient.     Telephone follow up appointment with care management team member scheduled for: 06/19/23  Verna Czech, LCSW [place]  [facility], [facility] Licensed Clinical Social Worker Care Coordinator  Direct Dial: [phone]

## 2023-06-11 NOTE — Patient Outreach (Signed)
Care Coordination   Follow Up Visit Note   06/11/2023 Name: Ashley Mccullough MRN: [id number] DOB: [birth year]  Ashley Mccullough is a 62 y.o. year old female who sees Ashley Kindle, FNP for primary care. I spoke with  Ashley Mccullough's aunt by phone today.  What matters to the patients health and wellness today?  Return call from patient's aunt to discuss concerns related to patient's care while in rehab.   Goals Addressed             This Visit's Progress    care coordination activities       Interventions Today    Flowsheet Row Most Recent Value  Chronic Disease   Chronic disease during today's visit --  [ambulatory limits-currenlty  in rehab]  General Interventions   General Interventions Discussed/Reviewed General Interventions Reviewed, Level of Care  Level of Care Skilled Nursing Facility  [confirmed that pt remains in rehab- aunt visits visits and/or calls patient daily-discussed concerns re: the quality of patient's care  which has been reported to nursing supv and Ombudsman-determination of accessible housing should be received by 9/27]              SDOH assessments and interventions completed:  No     Care Coordination Interventions:  Yes, provided   Follow up plan: Follow up call scheduled for 06/19/23    Encounter Outcome:  Patient Visit Completed

## 2023-06-15 ENCOUNTER — Telehealth: Payer: Self-pay | Admitting: *Deleted

## 2023-06-15 DIAGNOSIS — F338 Other recurrent depressive disorders: Secondary | ICD-10-CM | POA: Diagnosis not present

## 2023-06-15 DIAGNOSIS — M5430 Sciatica, unspecified side: Secondary | ICD-10-CM | POA: Diagnosis not present

## 2023-06-15 DIAGNOSIS — M4800 Spinal stenosis, site unspecified: Secondary | ICD-10-CM | POA: Diagnosis not present

## 2023-06-15 NOTE — Patient Outreach (Signed)
Care Coordination   Collaboration   Visit Note   06/15/2023 Name: Christyna Fenderson MRN: [id number] DOB: 06-Sep-1961  Maury Dus Beaber is a 62 y.o. year old female who sees Jacky Kindle, FNP for primary care. I  spoke with [facility] regarding status of patient's application  What matters to the patients health and wellness today?  Accessible housing post discharge from the skilled nursing  facility. This social worker to continue to assist with housing options post discharge from the skilled nursing facility.   Goals Addressed             This Visit's Progress    care coordination activities       Interventions Today    Flowsheet Row Most Recent Value  General Interventions   General Interventions Discussed/Reviewed Communication with  [patient remains in short-term rehab however is looking for accessible housing post discharge from the SNF]  Communication with --  [[facility], confirmed that patient is now on the waiting list-wait could be 6 months or more]              SDOH assessments and interventions completed:  No     Care Coordination Interventions:  Yes, provided   Follow up plan: Follow up call scheduled for 06/19/23    Encounter Outcome:  Patient Visit Completed

## 2023-06-16 ENCOUNTER — Encounter: Payer: Self-pay | Admitting: *Deleted

## 2023-06-16 ENCOUNTER — Encounter: Payer: HMO | Admitting: *Deleted

## 2023-06-16 ENCOUNTER — Telehealth: Payer: Self-pay | Admitting: *Deleted

## 2023-06-16 NOTE — Telephone Encounter (Signed)
This encounter was created in error - please disregard.

## 2023-06-16 NOTE — Patient Outreach (Signed)
Care Coordination   Follow Up Visit Note   06/16/2023 Name: Skky Reddinger MRN: [id number] DOB: [birth year]  Maury Dus Pletcher is a 62 y.o. year old female who sees Jacky Kindle, FNP for primary care. I spoke with  Maury Dus Aday's aunt by phone today.  What matters to the patients health and wellness today?  Accessible housing post discharge from short term rehab.    Goals Addressed             This Visit's Progress    care coordination activities       Interventions Today    Flowsheet Row Most Recent Value  Chronic Disease   Chronic disease during today's visit Other  [ambulatory limits-patient currenlty in rehab]  General Interventions   General Interventions Discussed/Reviewed General Interventions Reviewed, Level of Care  Level of Care Skilled Nursing Facility  [patient remains in rehab, confirmed that patient has been approved for accessible 1st floor apt in [place]. Patient will transition there following discharge from rehab. no anticipated discharge date from rehab received at this time]              SDOH assessments and interventions completed:  No     Care Coordination Interventions:  Yes, provided   Follow up plan: Follow up call scheduled for 06/26/23    Encounter Outcome:  Patient Visit Completed

## 2023-06-16 NOTE — Patient Instructions (Signed)
Visit Information  Thank you for taking time to visit with me today. Please don't hesitate to contact me if I can be of assistance to you.   Following are the goals we discussed today:  Please follow up with move-in process to accessible apartment in [place], [place] CSW to continue to assist patient with care coordination post discharge from short term rehab    Our next appointment is by telephone on 06/26/23 at 11am  Please call the care guide team at [phone] if you need to cancel or reschedule your appointment.   If you are experiencing a Mental Health or Behavioral Health Crisis or need someone to talk to, please call 911   Patient verbalizes understanding of instructions and care plan provided today and agrees to view in MyChart. Active MyChart status and patient understanding of how to access instructions and care plan via MyChart confirmed with patient.     Telephone follow up appointment with care management team member scheduled for: 06/26/23  Verna Czech, LCSW [place]  [facility], [facility] Licensed Clinical Social Worker Care Coordinator  Direct Dial: [phone]

## 2023-06-17 DIAGNOSIS — M6281 Muscle weakness (generalized): Secondary | ICD-10-CM | POA: Diagnosis not present

## 2023-06-17 DIAGNOSIS — Z6841 Body Mass Index (BMI) 40.0 and over, adult: Secondary | ICD-10-CM | POA: Diagnosis not present

## 2023-06-17 DIAGNOSIS — M48062 Spinal stenosis, lumbar region with neurogenic claudication: Secondary | ICD-10-CM | POA: Diagnosis not present

## 2023-06-17 DIAGNOSIS — R262 Difficulty in walking, not elsewhere classified: Secondary | ICD-10-CM | POA: Diagnosis not present

## 2023-06-19 ENCOUNTER — Encounter: Payer: HMO | Admitting: *Deleted

## 2023-06-19 ENCOUNTER — Other Ambulatory Visit: Payer: Self-pay

## 2023-06-19 ENCOUNTER — Telehealth: Payer: Self-pay | Admitting: Family Medicine

## 2023-06-19 DIAGNOSIS — K648 Other hemorrhoids: Secondary | ICD-10-CM | POA: Diagnosis not present

## 2023-06-19 DIAGNOSIS — F338 Other recurrent depressive disorders: Secondary | ICD-10-CM | POA: Diagnosis not present

## 2023-06-19 DIAGNOSIS — M4800 Spinal stenosis, site unspecified: Secondary | ICD-10-CM | POA: Diagnosis not present

## 2023-06-19 DIAGNOSIS — M5430 Sciatica, unspecified side: Secondary | ICD-10-CM | POA: Diagnosis not present

## 2023-06-19 NOTE — Telephone Encounter (Signed)
[facility] faxed refill request for the following medications:   HYDROcodone-acetaminophen (NORCO) 7.5-325 MG tablet    Please advise

## 2023-06-22 ENCOUNTER — Telehealth: Payer: Self-pay | Admitting: Family Medicine

## 2023-06-22 DIAGNOSIS — M5416 Radiculopathy, lumbar region: Secondary | ICD-10-CM | POA: Diagnosis not present

## 2023-06-22 DIAGNOSIS — M48061 Spinal stenosis, lumbar region without neurogenic claudication: Secondary | ICD-10-CM | POA: Diagnosis not present

## 2023-06-22 NOTE — Telephone Encounter (Signed)
[facility] is requesting prescription refill HYDROcodone-acetaminophen (NORCO) 7.5-325 MG tablet   Please advise

## 2023-06-23 ENCOUNTER — Other Ambulatory Visit: Payer: Self-pay | Admitting: Family Medicine

## 2023-06-23 NOTE — Patient Outreach (Signed)
Care Management   Visit Note   Name: Ashley Mccullough MRN: [id number] DOB: Feb 09, 1961  Subjective: Ashley Mccullough is a 62 y.o. year old female who is a primary care patient of Ashley Kindle, FNP. The Care Management team was consulted for assistance.      Engaged with patient via telephone.  Assessment:  Outpatient Encounter Medications as of 06/01/2023  Medication Sig   acetaminophen (TYLENOL) 650 MG CR tablet Take 650-1,300 mg by mouth at bedtime. As needed for arthritis.   Blood Glucose Monitoring Suppl DEVI 1 each by Does not apply route in the morning, at noon, and at bedtime. May substitute to any manufacturer covered by patient's insurance.   Blood Pressure Monitoring (CARETOUCH BP ARM MONITOR) DEVI Use to monitor bp twice daily   buPROPion (WELLBUTRIN XL) 150 MG 24 hr tablet Take 1 tablet (150 mg total) by mouth daily. (Patient not taking: Reported on 05/14/2023)   calcitRIOL (ROCALTROL) 0.25 MCG capsule TAKE 1 CAPSULE BY MOUTH 1 TIME EACH DAY   Cetirizine HCl 10 MG CAPS Take 1 capsule by mouth daily.   COLLAGEN PO Take by mouth.   FLUoxetine (PROZAC) 20 MG capsule Take 3 capsules (60 mg total) by mouth daily. Take 3 capsules daily.   gabapentin (NEURONTIN) 300 MG capsule Take 2 capsules (600 mg total) by mouth 3 (three) times daily.   hydrALAZINE (APRESOLINE) 50 MG tablet Take 1 tablet (50 mg total) by mouth 3 (three) times daily.   HYDROcodone-acetaminophen (NORCO) 7.5-325 MG tablet Take 2 tablets by mouth 2 (two) times daily as needed for moderate pain or severe pain.   hydrocortisone (ANUSOL-HC) 25 MG suppository Place 25 mg rectally 2 (two) times daily as needed. (Patient not taking: Reported on 05/13/2023)   hydrOXYzine (VISTARIL) 25 MG capsule Take 1 capsule (25 mg total) by mouth at bedtime as needed. (Patient taking differently: Take 25 mg by mouth every 8 (eight) hours as needed for anxiety.)   losartan (COZAAR) 100 MG tablet Take 1 tablet (100 mg total) by mouth  daily.   metoprolol succinate (TOPROL-XL) 25 MG 24 hr tablet TAKE 1 TABLET BY MOUTH DAILY   montelukast (SINGULAIR) 10 MG tablet Take 1 tablet (10 mg total) by mouth daily.   polyethylene glycol (MIRALAX / GLYCOLAX) 17 g packet Take 17 g by mouth 2 (two) times daily.   prochlorperazine (COMPAZINE) 10 MG tablet Take 1 tablet (10 mg total) by mouth every 6 (six) hours as needed for nausea or vomiting.   tirzepatide (MOUNJARO) 5 MG/0.5ML Pen Inject 5 mg into the skin once a week.   traZODone (DESYREL) 50 MG tablet Take 50 mg by mouth at bedtime as needed for sleep.   [DISCONTINUED] HYDROcodone-acetaminophen (NORCO) 7.5-325 MG tablet Take 2 tablets by mouth in the morning and at bedtime.   [DISCONTINUED] acetaminophen (TYLENOL) tablet 650 mg    [DISCONTINUED] calcitRIOL (ROCALTROL) capsule 0.25 mcg    [DISCONTINUED] FLUoxetine (PROZAC) capsule 60 mg    [DISCONTINUED] gabapentin (NEURONTIN) capsule 600 mg    [DISCONTINUED] hydrALAZINE (APRESOLINE) tablet 50 mg    [DISCONTINUED] HYDROcodone-acetaminophen (NORCO) 7.5-325 MG per tablet 2 tablet    [DISCONTINUED] hydrOXYzine (VISTARIL) capsule 25 mg    [DISCONTINUED] ibuprofen (ADVIL) tablet 600 mg    [DISCONTINUED] loratadine (CLARITIN) tablet 10 mg    [DISCONTINUED] metoprolol succinate (TOPROL-XL) 24 hr tablet 25 mg    [DISCONTINUED] montelukast (SINGULAIR) tablet 10 mg    [DISCONTINUED] polyethylene glycol (MIRALAX / GLYCOLAX) packet 17 g    [  DISCONTINUED] prochlorperazine (COMPAZINE) tablet 10 mg    [DISCONTINUED] traZODone (DESYREL) tablet 50 mg    No facility-administered encounter medications on file as of 06/01/2023.

## 2023-06-23 NOTE — Telephone Encounter (Signed)
Looks like this prescription was printed and not sent to the pharmacy?   Please review

## 2023-06-23 NOTE — Telephone Encounter (Signed)
Care Management   Visit Note   Name: Ashley Mccullough MRN: [id number] DOB: 28-May-1961  Subjective: Ashley Mccullough is a 62 y.o. year old female who is a primary care patient of Jacky Kindle, FNP. The Care Management team was consulted for assistance.      Engaged with patient: spoke with patient by telephone.   Assessment:  Outpatient Encounter Medications as of 03/27/2023  Medication Sig Note   acetaminophen (TYLENOL) 650 MG CR tablet Take 650-1,300 mg by mouth at bedtime. As needed for arthritis.    Blood Glucose Monitoring Suppl DEVI 1 each by Does not apply route in the morning, at noon, and at bedtime. May substitute to any manufacturer covered by patient's insurance.    Blood Pressure Monitoring (CARETOUCH BP ARM MONITOR) DEVI Use to monitor bp twice daily    calcitRIOL (ROCALTROL) 0.25 MCG capsule TAKE 1 CAPSULE BY MOUTH 1 TIME EACH DAY    Cetirizine HCl 10 MG CAPS Take 1 capsule by mouth daily.    COLLAGEN PO Take by mouth.    FLUoxetine (PROZAC) 20 MG capsule Take 3 capsules (60 mg total) by mouth daily. Take 3 capsules daily.    gabapentin (NEURONTIN) 300 MG capsule Take 2 capsules (600 mg total) by mouth 3 (three) times daily.    hydrocortisone (ANUSOL-HC) 25 MG suppository Place 25 mg rectally 2 (two) times daily as needed. (Patient not taking: Reported on 05/13/2023)    losartan (COZAAR) 100 MG tablet Take 1 tablet (100 mg total) by mouth daily.    montelukast (SINGULAIR) 10 MG tablet Take 1 tablet (10 mg total) by mouth daily.    polyethylene glycol (MIRALAX / GLYCOLAX) 17 g packet Take 17 g by mouth 2 (two) times daily.    prochlorperazine (COMPAZINE) 10 MG tablet Take 1 tablet (10 mg total) by mouth every 6 (six) hours as needed for nausea or vomiting.    traZODone (DESYREL) 50 MG tablet Take 50 mg by mouth at bedtime as needed for sleep.    [DISCONTINUED] buPROPion (WELLBUTRIN XL) 150 MG 24 hr tablet TAKE 1 TABLET BY MOUTH EVERY DAY 05/13/2023: pt request    [DISCONTINUED] hydrALAZINE (APRESOLINE) 50 MG tablet Take 0.5 tablets (25 mg total) by mouth 3 (three) times daily. Increased from 10 mg. (Patient taking differently: Take 50 mg by mouth 3 (three) times daily. Increased from 10 mg.)    [DISCONTINUED] HYDROcodone-acetaminophen (NORCO) 7.5-325 MG tablet Take 1-2 tablets by mouth every 6 (six) hours as needed for severe pain.    [DISCONTINUED] hydrOXYzine (VISTARIL) 25 MG capsule TAKE 1 CAPSULE (25 MG TOTAL) BY MOUTH EVERY 8 (EIGHT) HOURS AS NEEDED.    [DISCONTINUED] metoprolol succinate (TOPROL-XL) 25 MG 24 hr tablet TAKE 1 TABLET BY MOUTH DAILY    [DISCONTINUED] tirzepatide (ZEPBOUND) 7.5 MG/0.5ML Pen Inject 7.5 mg into the skin once a week.    No facility-administered encounter medications on file as of 03/27/2023.

## 2023-06-23 NOTE — Patient Outreach (Signed)
Care Management   Visit Note  06/23/2023 Name: Ashley Mccullough MRN: [id number] DOB: 1961/06/26  Subjective: Ashley Mccullough is a 62 y.o. year old female who is a primary care patient of Jacky Kindle, FNP. The Care Management team was consulted for assistance.      Engaged with patient via telephone.  Assessment:  Outpatient Encounter Medications as of 05/14/2023  Medication Sig   acetaminophen (TYLENOL) 650 MG CR tablet Take 650-1,300 mg by mouth at bedtime. As needed for arthritis.   Blood Glucose Monitoring Suppl DEVI 1 each by Does not apply route in the morning, at noon, and at bedtime. May substitute to any manufacturer covered by patient's insurance.   Blood Pressure Monitoring (CARETOUCH BP ARM MONITOR) DEVI Use to monitor bp twice daily   buPROPion (WELLBUTRIN XL) 150 MG 24 hr tablet Take 1 tablet (150 mg total) by mouth daily. (Patient not taking: Reported on 05/14/2023)   calcitRIOL (ROCALTROL) 0.25 MCG capsule TAKE 1 CAPSULE BY MOUTH 1 TIME EACH DAY   Cetirizine HCl 10 MG CAPS Take 1 capsule by mouth daily.   COLLAGEN PO Take by mouth.   FLUoxetine (PROZAC) 20 MG capsule Take 3 capsules (60 mg total) by mouth daily. Take 3 capsules daily.   gabapentin (NEURONTIN) 300 MG capsule Take 2 capsules (600 mg total) by mouth 3 (three) times daily.   hydrALAZINE (APRESOLINE) 50 MG tablet Take 1 tablet (50 mg total) by mouth 3 (three) times daily.   hydrocortisone (ANUSOL-HC) 25 MG suppository Place 25 mg rectally 2 (two) times daily as needed. (Patient not taking: Reported on 05/13/2023)   hydrOXYzine (VISTARIL) 25 MG capsule Take 1 capsule (25 mg total) by mouth at bedtime as needed. (Patient taking differently: Take 25 mg by mouth every 8 (eight) hours as needed for anxiety.)   losartan (COZAAR) 100 MG tablet Take 1 tablet (100 mg total) by mouth daily.   metoprolol succinate (TOPROL-XL) 25 MG 24 hr tablet TAKE 1 TABLET BY MOUTH DAILY   montelukast (SINGULAIR) 10 MG tablet  Take 1 tablet (10 mg total) by mouth daily.   polyethylene glycol (MIRALAX / GLYCOLAX) 17 g packet Take 17 g by mouth 2 (two) times daily.   prochlorperazine (COMPAZINE) 10 MG tablet Take 1 tablet (10 mg total) by mouth every 6 (six) hours as needed for nausea or vomiting.   tirzepatide (MOUNJARO) 5 MG/0.5ML Pen Inject 5 mg into the skin once a week.   traZODone (DESYREL) 50 MG tablet Take 50 mg by mouth at bedtime as needed for sleep.   [DISCONTINUED] HYDROcodone-acetaminophen (NORCO) 7.5-325 MG tablet Take 1-2 tablets by mouth every 6 (six) hours as needed for severe pain.   No facility-administered encounter medications on file as of 05/14/2023.

## 2023-06-24 ENCOUNTER — Other Ambulatory Visit: Payer: Self-pay | Admitting: Family Medicine

## 2023-06-24 DIAGNOSIS — M48062 Spinal stenosis, lumbar region with neurogenic claudication: Secondary | ICD-10-CM | POA: Diagnosis not present

## 2023-06-24 DIAGNOSIS — F32A Depression, unspecified: Secondary | ICD-10-CM | POA: Diagnosis not present

## 2023-06-24 DIAGNOSIS — M4800 Spinal stenosis, site unspecified: Secondary | ICD-10-CM | POA: Diagnosis not present

## 2023-06-24 DIAGNOSIS — M6281 Muscle weakness (generalized): Secondary | ICD-10-CM | POA: Diagnosis not present

## 2023-06-24 DIAGNOSIS — M5430 Sciatica, unspecified side: Secondary | ICD-10-CM | POA: Diagnosis not present

## 2023-06-24 MED ORDER — HYDROCODONE-ACETAMINOPHEN 7.5-325 MG PO TABS: 1.0000 | ORAL_TABLET | Freq: Four times a day (QID) | ORAL | 0 refills | Status: DC

## 2023-06-24 MED ORDER — HYDROCODONE-ACETAMINOPHEN 7.5-325 MG PO TABS: 1.0000 | ORAL_TABLET | Freq: Four times a day (QID) | ORAL | 0 refills | Status: AC

## 2023-06-26 ENCOUNTER — Ambulatory Visit: Payer: Self-pay | Admitting: *Deleted

## 2023-06-26 NOTE — Patient Instructions (Signed)
Visit Information  Thank you for taking time to visit with me today. Please don't hesitate to contact me if I can be of assistance to you.   Following are the goals we discussed today:  Keep all upcoming appointment discussed today Please contact your provider with any questions or concerns regarding your medical care Please consider alternative follow up plan for medical care due to planned move to [place]    Our next appointment is by telephone on 07/10/23 at 11am  Please call the care guide team at [phone] if you need to cancel or reschedule your appointment.   If you are experiencing a Mental Health or Behavioral Health Crisis or need someone to talk to, please call 911   Patient verbalizes understanding of instructions and care plan provided today and agrees to view in MyChart. Active MyChart status and patient understanding of how to access instructions and care plan via MyChart confirmed with patient.     Telephone follow up appointment with care management team member scheduled for: 07/10/23  Verna Czech, LCSW [place]  [facility], [facility] Licensed Clinical Social Worker Care Coordinator  Direct Dial: [phone]

## 2023-06-26 NOTE — Patient Outreach (Signed)
Care Coordination   Follow Up Visit Note   06/26/2023 Name: Ashley Mccullough MRN: [id number] DOB: 25-May-1961  Maury Dus Burcher is a 62 y.o. year old female who sees Jacky Kindle, FNP for primary care. I spoke with  Berneice Heinrich 's aunt Ms. Leath by phone today.  What matters to the patients health and wellness today?  Patient currently in rehab, patient needing to discharge to an accessible apartment. Confirmed plan to move to an accessible apartment in [place], upon discharge from rehab. Family has already moved patient's things to the new residence. Discussed plan to discuss transition to providers in the [place] area.     Goals Addressed             This Visit's Progress    care coordination activities       Interventions Today    Flowsheet Row Most Recent Value  Chronic Disease   Chronic disease during today's visit Other  [ambulatory limits, currenlty in rehab]  General Interventions   General Interventions Discussed/Reviewed General Interventions Reviewed, Doctor Visits  [confirmed plan for patient to move to apt in [place] post discharge from rehab on 10/13. Pt ambulating with a walker, will need assistance with transportation to medical appts. CSW assessed for needs to assist with discharge from the facility]  Doctor Visits Discussed/Reviewed Doctor Visits Reviewed  [10/10 [facility], surgeon to recommend referral to a pain doctor discussed options for primary care due to patient's planned move to [place], [place]]  Level of Care Skilled Nursing Facility  [patient to discharge from skilled care on 10/13]              SDOH assessments and interventions completed:  No     Care Coordination Interventions:  Yes, provided   Follow up plan: Follow up call scheduled for 07/10/23    Encounter Outcome:  Patient Visit Completed

## 2023-06-30 ENCOUNTER — Other Ambulatory Visit: Payer: HMO

## 2023-06-30 DIAGNOSIS — M4720 Other spondylosis with radiculopathy, site unspecified: Secondary | ICD-10-CM | POA: Diagnosis not present

## 2023-06-30 DIAGNOSIS — M5432 Sciatica, left side: Secondary | ICD-10-CM | POA: Diagnosis not present

## 2023-06-30 DIAGNOSIS — M549 Dorsalgia, unspecified: Secondary | ICD-10-CM | POA: Diagnosis not present

## 2023-06-30 DIAGNOSIS — M5431 Sciatica, right side: Secondary | ICD-10-CM | POA: Diagnosis not present

## 2023-06-30 DIAGNOSIS — E1122 Type 2 diabetes mellitus with diabetic chronic kidney disease: Secondary | ICD-10-CM | POA: Diagnosis not present

## 2023-06-30 DIAGNOSIS — G4733 Obstructive sleep apnea (adult) (pediatric): Secondary | ICD-10-CM | POA: Diagnosis not present

## 2023-06-30 DIAGNOSIS — M4800 Spinal stenosis, site unspecified: Secondary | ICD-10-CM | POA: Diagnosis not present

## 2023-06-30 DIAGNOSIS — F419 Anxiety disorder, unspecified: Secondary | ICD-10-CM | POA: Diagnosis not present

## 2023-06-30 DIAGNOSIS — N184 Chronic kidney disease, stage 4 (severe): Secondary | ICD-10-CM | POA: Diagnosis not present

## 2023-06-30 DIAGNOSIS — M25511 Pain in right shoulder: Secondary | ICD-10-CM | POA: Diagnosis not present

## 2023-06-30 DIAGNOSIS — Z602 Problems related to living alone: Secondary | ICD-10-CM | POA: Diagnosis not present

## 2023-06-30 DIAGNOSIS — E785 Hyperlipidemia, unspecified: Secondary | ICD-10-CM | POA: Diagnosis not present

## 2023-06-30 DIAGNOSIS — R296 Repeated falls: Secondary | ICD-10-CM | POA: Diagnosis not present

## 2023-06-30 DIAGNOSIS — Z9181 History of falling: Secondary | ICD-10-CM | POA: Diagnosis not present

## 2023-06-30 DIAGNOSIS — I129 Hypertensive chronic kidney disease with stage 1 through stage 4 chronic kidney disease, or unspecified chronic kidney disease: Secondary | ICD-10-CM | POA: Diagnosis not present

## 2023-06-30 DIAGNOSIS — I2510 Atherosclerotic heart disease of native coronary artery without angina pectoris: Secondary | ICD-10-CM | POA: Diagnosis not present

## 2023-06-30 DIAGNOSIS — F32A Depression, unspecified: Secondary | ICD-10-CM | POA: Diagnosis not present

## 2023-06-30 DIAGNOSIS — M6281 Muscle weakness (generalized): Secondary | ICD-10-CM | POA: Diagnosis not present

## 2023-06-30 DIAGNOSIS — G894 Chronic pain syndrome: Secondary | ICD-10-CM | POA: Diagnosis not present

## 2023-06-30 DIAGNOSIS — M6250 Muscle wasting and atrophy, not elsewhere classified, unspecified site: Secondary | ICD-10-CM | POA: Diagnosis not present

## 2023-06-30 DIAGNOSIS — Z7985 Long-term (current) use of injectable non-insulin antidiabetic drugs: Secondary | ICD-10-CM | POA: Diagnosis not present

## 2023-06-30 DIAGNOSIS — Z6841 Body Mass Index (BMI) 40.0 and over, adult: Secondary | ICD-10-CM | POA: Diagnosis not present

## 2023-07-02 ENCOUNTER — Other Ambulatory Visit: Payer: Self-pay | Admitting: Family Medicine

## 2023-07-02 NOTE — Patient Outreach (Signed)
Care Management   Visit Note   Name: Ashley Mccullough MRN: [id number] DOB: 1961/05/07  Subjective: Ashley Mccullough is a 62 y.o. year old female who is a primary care patient of Jacky Kindle, FNP. The Care Management team was consulted for assistance.      Engaged with patient via telephone.  Assessment:  Outpatient Encounter Medications as of 06/30/2023  Medication Sig   acetaminophen (TYLENOL) 650 MG CR tablet Take 650-1,300 mg by mouth at bedtime. As needed for arthritis.   Blood Glucose Monitoring Suppl DEVI 1 each by Does not apply route in the morning, at noon, and at bedtime. May substitute to any manufacturer covered by patient's insurance.   Blood Pressure Monitoring (CARETOUCH BP ARM MONITOR) DEVI Use to monitor bp twice daily   buPROPion (WELLBUTRIN XL) 150 MG 24 hr tablet Take 1 tablet (150 mg total) by mouth daily. (Patient not taking: Reported on 05/14/2023)   calcitRIOL (ROCALTROL) 0.25 MCG capsule TAKE 1 CAPSULE BY MOUTH 1 TIME EACH DAY   Cetirizine HCl 10 MG CAPS Take 1 capsule by mouth daily.   COLLAGEN PO Take by mouth.   FLUoxetine (PROZAC) 20 MG capsule Take 3 capsules (60 mg total) by mouth daily. Take 3 capsules daily.   gabapentin (NEURONTIN) 300 MG capsule Take 2 capsules (600 mg total) by mouth 3 (three) times daily.   hydrALAZINE (APRESOLINE) 50 MG tablet Take 1 tablet (50 mg total) by mouth 3 (three) times daily.   HYDROcodone-acetaminophen (NORCO) 7.5-325 MG tablet Take 1 tablet by mouth in the morning, at noon, in the evening, and at bedtime.   hydrocortisone (ANUSOL-HC) 25 MG suppository Place 25 mg rectally 2 (two) times daily as needed. (Patient not taking: Reported on 05/13/2023)   hydrOXYzine (VISTARIL) 25 MG capsule Take 1 capsule (25 mg total) by mouth at bedtime as needed. (Patient taking differently: Take 25 mg by mouth every 8 (eight) hours as needed for anxiety.)   losartan (COZAAR) 100 MG tablet Take 1 tablet (100 mg total) by mouth daily.    metoprolol succinate (TOPROL-XL) 25 MG 24 hr tablet TAKE 1 TABLET BY MOUTH DAILY   montelukast (SINGULAIR) 10 MG tablet Take 1 tablet (10 mg total) by mouth daily.   polyethylene glycol (MIRALAX / GLYCOLAX) 17 g packet Take 17 g by mouth 2 (two) times daily.   prochlorperazine (COMPAZINE) 10 MG tablet Take 1 tablet (10 mg total) by mouth every 6 (six) hours as needed for nausea or vomiting.   tirzepatide (MOUNJARO) 5 MG/0.5ML Pen Inject 5 mg into the skin once a week.   traZODone (DESYREL) 50 MG tablet Take 50 mg by mouth at bedtime as needed for sleep.   No facility-administered encounter medications on file as of 06/30/2023.    Interventions:   Goals Addressed             This Visit's Progress    Care Management        Current Barriers:  Care Management support and education needs related to Hypertensive Kidney Disease, High Fall Risk and Depression. Unable to perform ADLs independently Unable to perform IADLs independently   Planned Interventions: Hypertensive Kidney Disease, Reviewed current treatment plans. Reviewed medications. Reports taking medications as prescribed. She has relocated to [place]. Reports currently having all needed prescriptions. She will require new medication orders to receive prescriptions at the local [facility]. Will follow up and forward message to Primary Care Provider. Reviewed information regarding established blood pressure parameters. Reports readings have improved.  Reports reading today of 130/80. Advised to monitor consistently and maintain a log. Reviewed indications for notifying a provider when readings are outside of parameters.  Reviewed symptoms. Denies chest pain or palpitations. Denies headaches, dizziness, or visual changes. Reports significant decline in activity tolerance. Experiences dyspnea with minimal exertion. She is currently working with [facility] PT. Reviewed nutritional intake. Reports attempting to adhere to a heart  healthy diet. Her ability to prepare meals is limited. Reports her sister is currently assisting with meals. Discussed options for meal delivery. Will forward information regarding [employer]. She will also contact her insurance provider to discuss options available via her insurance plan. Encouraged to read nutrition labels, continue monitoring sodium intake, and avoid highly processed foods when possible. She is being followed closely d/t chronic kidney disease. Reports not requiring a fluid restriction. Reviewed  Reviewed s/sx of heart attack, stroke and worsening symptoms that require immediate medical attention.    BP Readings from Last 3 Encounters:  06/03/23 120/68  05/13/23 135/77  12/18/22 118/62    Wt Readings from Last 3 Encounters:  06/01/23 285 lb 15 oz (129.7 kg)  12/18/22 (!) 355 lb (161 kg)  10/22/22 (!) 355 lb 13.2 oz (161.4 kg)     Lab Results  Component Value Date   BUN 34 (H) 05/29/2023    Patient Goals/Self-Care Activities: Take medications as prescribed   Attend all scheduled provider appointments Call pharmacy for medication refills 3-7 days in advance of running out of medications Call provider office for new concerns or questions  Check blood pressure at least a few times a week Keep a blood pressure log Call doctor for signs and symptoms of high blood pressure Report new symptoms to your doctor Eat whole grains, fruits and vegetables, lean meats and healthy fats Limit salt intake  Work with LCSW to address care coordination and counseling needs  Work with [facility] and clinic team to address health care and disease management related needs  ////////////////////////////////////////////////////////////////////////  Planned Interventions: Fall Risks Discussed recent fall. Reports experiencing a fall after losing her balance. Currently experiencing discomfort to her right hip. Denies bumping or injuring her head during the fall. Denies visible  wounds. Discussed plan since discharge from Rehab.  She is currently receiving physical therapy services with [facility]. Reports session today went very well. Plan is to include Occupational Therapy and Home Health Aide.  Provided information regarding safety and fall prevention measures. Reports being in her new apartment for approximately two days. Reports unit is located on the first floor and very accessible. Currently using a walker with all ambulation. Denies falls since discharge from rehabilitation facility. Reviewed ability to perform ADLs and IADLs. Her activity tolerance remains limited d/t mobility limitation and chronic pain. She is unable to complete ADLs and IADLs independently. She was previously receiving assistance twice a week at her previous residence. Her private pay aide will no longer be available d/t the distance at her new residence. Reports her sister lives in the area and currently readily available to assist. They will continue working with the LCSW to discuss options for additional assistance in the home. Discussed long term plan. She will continue receiving primary care services with [facility] until she establishes care with a new primary care provider in [place], [place]. Also discussed plan regarding insurance coverage as she is currently insured with [employer] and benefits provide limited coverage outside of the [place] area. Current plan is to remain in the home with additional  assistance. Remains very motivated to increase strength and endurance to improve mobility. Agreed to keep the care team updated of changes in functional status.    Patient Goals/Self-Care Activities: Use assistive device/walker with all ambulation Continue working with [facility] and follow recommended activity restrictions Ensure pathways are clear and well lit Wear non-skid footwear to prevent accidental fall and injuries Follow recommended safety and fall prevention  measures   Follow Up Plan:  Will follow up within the next week            PLAN Will follow up within the next week.   [facility]ina [facility]  [place], [facility] RN Care Manager Direct Dial: [phone] Website: [name]

## 2023-07-03 ENCOUNTER — Encounter: Payer: Self-pay | Admitting: Family Medicine

## 2023-07-03 NOTE — Telephone Encounter (Signed)
Requested Prescriptions  Refused Prescriptions Disp Refills   gabapentin (NEURONTIN) 300 MG capsule [Pharmacy Med Name: GABAPENTIN 300MG  CAPSULES] 540 capsule     Sig: TAKE 2 CAPSULES(600 MG) BY MOUTH THREE TIMES DAILY     Neurology: Anticonvulsants - gabapentin Failed - 07/02/2023  8:02 PM      Failed - Cr in normal range and within 360 days    Creatinine, Ser  Date Value Ref Range Status  06/03/2023 3.41 (H) 0.44 - 1.00 mg/dL Final         Passed - Completed PHQ-2 or PHQ-9 in the last 360 days      Passed - Valid encounter within last 12 months    Recent Outpatient Visits           1 month ago Hypertensive kidney disease with stage 4 chronic kidney disease (HCC)   [facility] Jacky Kindle, FNP   6 months ago Dizziness   [place] [facility] Alfredia Ferguson, PA-C   7 months ago Spinal stenosis of lumbar region with neurogenic claudication   [facility] Merita Norton T, FNP   8 months ago Self-care deficit for feeding, bathing, and toileting   [place] [facility] Merita Norton T, FNP   1 year ago Morbid obesity due to excess calories [facility])   [facility] Jacky Kindle, [place]

## 2023-07-06 ENCOUNTER — Other Ambulatory Visit: Payer: Self-pay | Admitting: Family Medicine

## 2023-07-06 MED ORDER — GABAPENTIN 600 MG PO TABS: 600.0000 mg | ORAL_TABLET | Freq: Three times a day (TID) | ORAL | 0 refills | Status: DC

## 2023-07-07 ENCOUNTER — Other Ambulatory Visit: Payer: Self-pay

## 2023-07-09 ENCOUNTER — Ambulatory Visit: Payer: Self-pay | Admitting: *Deleted

## 2023-07-09 NOTE — Patient Outreach (Signed)
Care Coordination   Follow Up Visit Note   07/10/2023 Name: Sinthia Piotter MRN: [id number] DOB: 20-Dec-1960  Maury Dus Borges is a 62 y.o. year old female who sees Jacky Kindle, FNP for primary care. I spoke with  Berneice Heinrich by phone today.  What matters to the patients health and wellness today?  Patient now residing in a 1 level apartment in [place], [place] following her rehab stay. Patient receiving HH services(PT, OT, SW) neighbor is a CNA and is agreeable to assist with her daily care needs. Patient's sister available to assist as well. Patient denied having any additional community resource needs at this time   Goals Addressed             This Visit's Progress    care coordination activities       Interventions Today    Flowsheet Row Most Recent Value  Chronic Disease   Chronic disease during today's visit Other  [ambulatory challenges, recent discharge from short term rehab]  General Interventions   General Interventions Discussed/Reviewed General Interventions Reviewed, Doctor Visits, Level of Care  [assessed for community resource needs post discharge from short term rehab-pt discharged wih HH PT,OT ans SW]  Doctor Visits Discussed/Reviewed PCP  [patient confims plan to continue with current PCP until a local PCP is identified in [place] for follow up]  Level of Care Personal Care Services  [confirmed that patient's neighbor is a CNA agreeable to provide in home care assistance daily, sister to provide transportation when needed]  Nutrition Interventions   Nutrition Discussed/Reviewed Nutrition Discussed  [patient's sister assists with weekly meal prep-patient also referred to meals on wheels]              SDOH assessments and interventions completed:  No     Care Coordination Interventions:  Yes, provided   Follow up plan: No further intervention required.   Encounter Outcome:  Patient Visit Completed

## 2023-07-10 ENCOUNTER — Encounter: Payer: Self-pay | Admitting: *Deleted

## 2023-07-10 NOTE — Patient Instructions (Signed)
Visit Information  Thank you for taking time to visit with me today. Please don't hesitate to contact me if I can be of assistance to you.   Following are the goals we discussed today:    Keep all upcoming appointment discussed today Continue to contact your providers with any questions regarding your medical care   Please call the care guide team at [phone] if you need to cancel or reschedule your appointment.   If you are experiencing a Mental Health or Behavioral Health Crisis or need someone to talk to, please call 911   Patient verbalizes understanding of instructions and care plan provided today and agrees to view in MyChart. Active MyChart status and patient understanding of how to access instructions and care plan via MyChart confirmed with patient.     No further follow up required: patient to contact this Child psychotherapist with any additional community resource needs  [employer], [employer] [place]  [facility], [facility] Licensed Clinical Social Geologist, engineering Dial: [phone]

## 2023-07-10 NOTE — Patient Outreach (Signed)
Care Management   Visit Note   Name: Ashley Mccullough MRN: [id number] DOB: 04-27-1961  Subjective: Ashley Mccullough is a 62 y.o. year old female who is a primary care patient of Jacky Kindle, FNP. The Care Management team was consulted for assistance.      Engaged with patient via telephone.  Assessment:  Outpatient Encounter Medications as of 07/07/2023  Medication Sig   acetaminophen (TYLENOL) 650 MG CR tablet Take 650-1,300 mg by mouth at bedtime. As needed for arthritis.   Blood Glucose Monitoring Suppl DEVI 1 each by Does not apply route in the morning, at noon, and at bedtime. May substitute to any manufacturer covered by patient's insurance.   Blood Pressure Monitoring (CARETOUCH BP ARM MONITOR) DEVI Use to monitor bp twice daily   buPROPion (WELLBUTRIN XL) 150 MG 24 hr tablet Take 1 tablet (150 mg total) by mouth daily. (Patient not taking: Reported on 05/14/2023)   calcitRIOL (ROCALTROL) 0.25 MCG capsule TAKE 1 CAPSULE BY MOUTH 1 TIME EACH DAY   Cetirizine HCl 10 MG CAPS Take 1 capsule by mouth daily.   COLLAGEN PO Take by mouth.   FLUoxetine (PROZAC) 20 MG capsule Take 3 capsules (60 mg total) by mouth daily. Take 3 capsules daily.   gabapentin (NEURONTIN) 600 MG tablet Take 1 tablet (600 mg total) by mouth 3 (three) times daily.   hydrALAZINE (APRESOLINE) 50 MG tablet Take 1 tablet (50 mg total) by mouth 3 (three) times daily.   HYDROcodone-acetaminophen (NORCO) 7.5-325 MG tablet Take 1 tablet by mouth in the morning, at noon, in the evening, and at bedtime.   hydrocortisone (ANUSOL-HC) 25 MG suppository Place 25 mg rectally 2 (two) times daily as needed. (Patient not taking: Reported on 05/13/2023)   hydrOXYzine (VISTARIL) 25 MG capsule Take 1 capsule (25 mg total) by mouth at bedtime as needed. (Patient taking differently: Take 25 mg by mouth every 8 (eight) hours as needed for anxiety.)   losartan (COZAAR) 100 MG tablet Take 1 tablet (100 mg total) by mouth daily.    metoprolol succinate (TOPROL-XL) 25 MG 24 hr tablet TAKE 1 TABLET BY MOUTH DAILY   montelukast (SINGULAIR) 10 MG tablet Take 1 tablet (10 mg total) by mouth daily.   polyethylene glycol (MIRALAX / GLYCOLAX) 17 g packet Take 17 g by mouth 2 (two) times daily.   prochlorperazine (COMPAZINE) 10 MG tablet Take 1 tablet (10 mg total) by mouth every 6 (six) hours as needed for nausea or vomiting.   tirzepatide (MOUNJARO) 5 MG/0.5ML Pen Inject 5 mg into the skin once a week.   traZODone (DESYREL) 50 MG tablet Take 50 mg by mouth at bedtime as needed for sleep.   No facility-administered encounter medications on file as of 07/07/2023.    Interventions:   Goals Addressed             This Visit's Progress    Care Management        Current Barriers:  Care Management support and education needs related to Hypertensive Kidney Disease, High Fall Risk and Depression. Unable to perform ADLs independently Unable to perform IADLs independently   Planned Interventions: Hypertensive Kidney Disease, Reviewed current treatment plans. Reviewed medications. Reports taking medications as prescribed. She has relocated to [place]. Reports currently having all needed prescriptions. She will require new medication orders to [facility]. Will follow up and forward message to Primary Care Provider. Reviewed information regarding established blood pressure parameters. Reports readings have improved.  Reports reading today of 130/80. Advised to monitor consistently and maintain a log. Reviewed indications for notifying a provider when readings are outside of parameters.  Reviewed symptoms. Denies chest pain or palpitations. Denies headaches, dizziness, or visual changes. Reports significant decline in activity tolerance. Experiences dyspnea with minimal exertion. She is currently working with [facility] PT. Reviewed nutritional intake. Reports attempting to adhere to a heart  healthy diet. Her ability to prepare meals is limited. Reports her sister is currently assisting with meals. Discussed options for meal delivery. Will forward information regarding [employer]als. She will also contact her insurance provider to discuss options available via her insurance plan. Encouraged to read nutrition labels, continue monitoring sodium intake, and avoid highly processed foods when possible. She is being followed closely d/t chronic kidney disease. Reports not requiring a fluid restriction. Reviewed  Reviewed s/sx of heart attack, stroke and worsening symptoms that require immediate medical attention. Update 07/06/22: Provided update regarding resources. Patient and her sister agreed  that she would benefit from meal delivery to offset days her sister is not available. Informed patient of message received r/t no further referrals for [facility] Meals. Patient RNCM will follow up with agencies in the [place] area.  Discussed updates related to engagement with a Nephrology program d/t CKD. Message relayed to the [facility] Renal Coordinator regarding patient's eligibility for their program. RNCM follow up with PCP regarding specific CKD staging and update Renal Coordinator with patient's information.    BP Readings from Last 3 Encounters:  06/03/23 120/68  05/13/23 135/77  12/18/22 118/62    Wt Readings from Last 3 Encounters:  06/01/23 285 lb 15 oz (129.7 kg)  12/18/22 (!) 355 lb (161 kg)  10/22/22 (!) 355 lb 13.2 oz (161.4 kg)     Lab Results  Component Value Date   BUN 34 (H) 05/29/2023    Patient Goals/Self-Care Activities: Take medications as prescribed   Attend all scheduled provider appointments Call pharmacy for medication refills 3-7 days in advance of running out of medications Call provider office for new concerns or questions  Check blood pressure at least a few times a week Keep a blood pressure log Call doctor for signs and symptoms of high blood pressure Report  new symptoms to your doctor Eat whole grains, fruits and vegetables, lean meats and healthy fats Limit salt intake  Work with LCSW to address care coordination and counseling needs  Work with [facility] and clinic team to address health care and disease management related needs         PLAN Will follow up within the next week.    Katina [facility]  [facility], [facility] RN Care Manager Direct Dial: [phone] Website: [name]

## 2023-07-13 ENCOUNTER — Encounter: Payer: Self-pay | Admitting: Family Medicine

## 2023-07-16 ENCOUNTER — Other Ambulatory Visit: Payer: Self-pay | Admitting: Family Medicine

## 2023-07-16 DIAGNOSIS — F419 Anxiety disorder, unspecified: Secondary | ICD-10-CM

## 2023-07-16 DIAGNOSIS — I129 Hypertensive chronic kidney disease with stage 1 through stage 4 chronic kidney disease, or unspecified chronic kidney disease: Secondary | ICD-10-CM

## 2023-07-16 MED ORDER — FLUOXETINE HCL 20 MG PO CAPS: 60.0000 mg | ORAL_CAPSULE | Freq: Every day | ORAL | 1 refills | Status: AC

## 2023-07-16 MED ORDER — LOSARTAN POTASSIUM 100 MG PO TABS: 100.0000 mg | ORAL_TABLET | Freq: Every day | ORAL | 1 refills | Status: AC

## 2023-07-16 MED ORDER — GABAPENTIN 600 MG PO TABS: 600.0000 mg | ORAL_TABLET | Freq: Three times a day (TID) | ORAL | 2 refills | Status: AC

## 2023-07-16 MED ORDER — MONTELUKAST SODIUM 10 MG PO TABS: 10.0000 mg | ORAL_TABLET | Freq: Every day | ORAL | 2 refills | Status: AC

## 2023-07-16 MED ORDER — METOPROLOL SUCCINATE ER 25 MG PO TB24: ORAL_TABLET | ORAL | 1 refills | Status: DC

## 2023-07-16 NOTE — Telephone Encounter (Signed)
Requested medications are due for refill today.  No - But pt wants meds sent to a different pharmacy   Requested medications are on the active medications list.  Yes for both  Last refill. varied  Future visit scheduled.   no  Notes to clinic.  Missing labs.    Requested Prescriptions  Pending Prescriptions Disp Refills   hydrALAZINE (APRESOLINE) 50 MG tablet 270 tablet 3    Sig: Take 1 tablet (50 mg total) by mouth 3 (three) times daily.     Cardiovascular:  Vasodilators Failed - 07/16/2023  5:10 PM      Failed - HCT in normal range and within 360 days    HCT  Date Value Ref Range Status  05/29/2023 31.8 (L) 36.0 - 46.0 % Final   Hematocrit  Date Value Ref Range Status  06/02/2022 37.0 34.0 - 46.6 % Final         Failed - HGB in normal range and within 360 days    Hemoglobin  Date Value Ref Range Status  05/29/2023 10.0 (L) 12.0 - 15.0 g/dL Final  16/06/9603 54.0 11.1 - 15.9 g/dL Final         Failed - RBC in normal range and within 360 days    RBC  Date Value Ref Range Status  05/29/2023 3.55 (L) 3.87 - 5.11 MIL/uL Final         Failed - ANA Screen, Ifa, Serum in normal range and within 360 days    No results found for: "ANA", "ANATITER", "LABANTI"       Passed - WBC in normal range and within 360 days    WBC  Date Value Ref Range Status  05/29/2023 9.5 4.0 - 10.5 K/uL Final         Passed - PLT in normal range and within 360 days    Platelets  Date Value Ref Range Status  05/29/2023 270 150 - 400 K/uL Final  06/02/2022 259 150 - 450 x10E3/uL Final         Passed - Last BP in normal range    BP Readings from Last 1 Encounters:  06/03/23 120/68         Passed - Valid encounter within last 12 months    Recent Outpatient Visits           2 months ago Hypertensive kidney disease with stage 4 chronic kidney disease (HCC)   [place] [facility] Jacky Kindle, FNP   7 months ago Dizziness   [place] [facility]  Alfredia Ferguson, PA-C   7 months ago Spinal stenosis of lumbar region with neurogenic claudication   [facility]   8 months ago Self-care deficit for feeding, bathing, and toileting   [place] Merita Norton T, FNP   1 year ago Morbid obesity due to excess calories [facility])   [facility] Merita Norton T, FNP               calcitRIOL (ROCALTROL) 0.25 MCG capsule 90 capsule 3     Endocrinology:  Vitamins - Vitamin D Supplementation - calcitriol Failed - 07/16/2023  5:10 PM      Failed - Phosphate in normal range and within 360 days    Phosphorus  Date Value Ref Range Status  01/23/2019 3.7 2.5 - 4.6 mg/dL Final    Comment:    Performed at [facility], [address] [place], [place] [address]         Failed - PTH in normal range and within 360 days    No results found for: "IOPTH", "PTHINTACTFNA", "PTH"       Passed - Ca in normal range and within 360 days    Calcium  Date Value Ref Range Status  05/29/2023 9.0 8.9 - 10.3 mg/dL Final         Passed - Valid encounter within last 12 months    Recent Outpatient Visits           2 months ago Hypertensive kidney disease with stage 4 chronic kidney disease (HCC)   [place] [facility], FNP   7 months ago Dizziness   [place] [facility] Alfredia Ferguson, PA-C   7 months ago Spinal stenosis of lumbar region with neurogenic claudication   [facility] T, FNP   8 months ago Self-care deficit for feeding, bathing, and toileting   [place] [facility] Mer[facility] Norton T, FNP   1 year ago Morbid obesity due to excess calories [facility])   [facility] Merita Norton T, [place]              Signed Prescriptions Disp Refills   losartan (COZAAR) 100 MG tablet 90 tablet 1    Sig: Take 1 tablet (100 mg  total) by mouth daily.     Cardiovascular:  Angiotensin Receptor Blockers Failed - 07/16/2023  5:10 PM      Failed - Cr in normal range and within 180 days    Creatinine, Ser  Date Value Ref Range Status  06/03/2023 3.41 (H) 0.44 - 1.00 mg/dL Final         Passed - K in normal range and within 180 days    Potassium  Date Value Ref Range Status  05/29/2023 4.2 3.5 - 5.1 mmol/L Final         Passed - Patient is not pregnant      Passed - Last BP in normal range    BP Readings from Last 1 Encounters:  06/03/23 120/68         Passed - Valid encounter within last 6 months    Recent Outpatient Visits           2 months ago Hypertensive kidney disease with stage 4 chronic kidney disease (HCC)   [place] [facility] Jacky Kindle, FNP   7 months ago Dizziness   [place] [facility] Alfredia Ferguson, PA-C   7 months ago Spinal stenosis of lumbar region with neurogenic claudication   [facility] Merita Norton T, FNP   8 months ago Self-care deficit for feeding, bathing, and toileting   [place] [facility] Mer[facility] Norton T, FNP   1 year ago Morbid obesity due to excess calories [facility])   [facility] Mer[facility] Norton T, FNP               metoprolol succinate (TOPROL-XL) 25 MG 24 hr tablet 90 tablet 1    Sig: TAKE 1 TABLET BY MOUTH DAILY     Cardiovascular:  Beta Blockers Passed - 07/16/2023  5:10 PM      Passed - Last BP in normal range    BP Readings from Last 1 Encounters:  06/03/23 120/68         Passed - Last  Heart Rate in normal range    Pulse Readings from Last 1 Encounters:  06/03/23 74         Passed - Valid encounter within last 6 months    Recent Outpatient Visits           2 months ago Hypertensive kidney disease with stage 4 chronic kidney disease (HCC)   [place] Jacky Kindle, FNP   7 months ago Dizziness   [place]  [facility] Alfredia Ferguson, PA-C   7 months ago Spinal stenosis of lumbar region with neurogenic claudication   [facility] Jack[facility], FNP   8 months ago Self-care deficit for feeding, bathing, and toileting   [place] [facility] Merita Norton T, FNP   1 year ago Morbid obesity due to excess calories [facility])   [place] [facility] Merita Norton T, FNP               montelukast (SINGULAIR) 10 MG tablet 90 tablet 2    Sig: Take 1 tablet (10 mg total) by mouth daily.     Pulmonology:  Leukotriene Inhibitors Passed - 07/16/2023  5:10 PM      Passed - Valid encounter within last 12 months    Recent Outpatient Visits           2 months ago Hypertensive kidney disease with stage 4 chronic kidney disease (HCC)   [place] [facility] Jack[facility] Kindle, FNP   7 months ago Dizziness   [place] [facility] Alfredia Ferguson, PA-C   7 months ago Spinal stenosis of lumbar region with neurogenic claudication   [place], FNP   8 months ago Self-care deficit for feeding, bathing, and toileting   [facility] Merita Norton T, FNP   1 year ago Morbid obesity due to excess calories [facility])   [place] Merita Norton T, FNP               gabapentin (NEURONTIN) 600 MG tablet 270 tablet 2    Sig: Take 1 tablet (600 mg total) by mouth 3 (three) times daily.     Neurology: Anticonvulsants - gabapentin Failed - 07/16/2023  5:10 PM      Failed - Cr in normal range and within 360 days    Creatinine, Ser  Date Value Ref Range Status  06/03/2023 3.41 (H) 0.44 - 1.00 mg/dL Final         Passed - Completed PHQ-2 or PHQ-9 in the last 360 days      Passed - Valid encounter within last 12 months    Recent Outpatient Visits           2 months ago Hypertensive kidney disease with stage 4  chronic kidney disease [facility])   [place] [facility] Jacky Kindle, FNP   7 months ago Dizziness   [place] [facility] Alfredia Ferguson, PA-C   7 months ago Spinal stenosis of lumbar region with neurogenic claudication   [facility] Mer[facility] T, FNP   8 months ago Self-care deficit for feeding, bathing, and toileting    [facility] Merita Norton T, FNP   1 year ago Morbid obesity due to excess calories [facility]   [facility] Jack[facility] Kindle, [place]  FLUoxetine (PROZAC) 20 MG capsule 270 capsule 1    Sig: Take 3 capsules (60 mg total) by mouth daily. Take 3 capsules daily.     Psychiatry:  Antidepressants - SSRI Passed - 07/16/2023  5:10 PM      Passed - Completed PHQ-2 or PHQ-9 in the last 360 days      Passed - Valid encounter within last 6 months    Recent Outpatient Visits           2 months ago Hypertensive kidney disease with stage 4 chronic kidney disease [facility])   [place] [facility] Jacky Kindle, FNP   7 months ago Dizziness   [place] [facility] Alfredia Ferguson, PA-C   7 months ago Spinal stenosis of lumbar region with neurogenic claudication   [facility], FNP   8 months ago Self-care deficit for feeding, bathing, and toileting   [facility] Merita Norton T, FNP   1 year ago Morbid obesity due to excess calories [facility])   [facility] Jacky Kindle, [place]

## 2023-07-16 NOTE — Telephone Encounter (Signed)
Medication Refill - Medication: losartan (COZAAR) 100 MG tablet metoprolol succinate (TOPROL-XL) 25 MG 24 hr tablet montelukast (SINGULAIR) 10 MG tablet hydrALAZINE (APRESOLINE) 50 MG tablet FLUoxetine (PROZAC) 20 MG capsule calcitRIOL (ROCALTROL) 0.25 MCG capsule gabapentin (NEURONTIN) 600 MG tablet   Has the patient contacted their pharmacy? Yes.     (Agent: If yes, when and what did the pharmacy advise?) Pharmacy Calling in   Preferred Pharmacy (with phone number or street name): [facility] [address] [place] - [address]   Has the patient been seen for an appointment in the last year OR does the patient have an upcoming appointment? Yes.    Agent: Please be advised that RX refills may take up to 3 business days. We ask that you follow-up with your pharmacy.

## 2023-07-16 NOTE — Telephone Encounter (Signed)
Requested Prescriptions  Pending Prescriptions Disp Refills   losartan (COZAAR) 100 MG tablet 90 tablet 3    Sig: Take 1 tablet (100 mg total) by mouth daily.     Cardiovascular:  Angiotensin Receptor Blockers Failed - 07/16/2023  5:10 PM      Failed - Cr in normal range and within 180 days    Creatinine, Ser  Date Value Ref Range Status  06/03/2023 3.41 (H) 0.44 - 1.00 mg/dL Final         Passed - K in normal range and within 180 days    Potassium  Date Value Ref Range Status  05/29/2023 4.2 3.5 - 5.1 mmol/L Final         Passed - Patient is not pregnant      Passed - Last BP in normal range    BP Readings from Last 1 Encounters:  06/03/23 120/68         Passed - Valid encounter within last 6 months    Recent Outpatient Visits           2 months ago Hypertensive kidney disease with stage 4 chronic kidney disease (HCC)   [place] [facility] Jack[facility], FNP   7 months ago Dizziness   [place] [facility] Alfred[facility] Ferguson, PA-C   7 months ago Spinal stenosis of lumbar region with neurogenic claudication   [facility] T, FNP   8 months ago Self-care deficit for feeding, bathing, and toileting   [facility] Merita Norton T, FNP   1 year ago Morbid obesity due to excess calories [facility])   [place] [facility] Merita Norton T, FNP               metoprolol succinate (TOPROL-XL) 25 MG 24 hr tablet 90 tablet 0    Sig: TAKE 1 TABLET BY MOUTH DAILY     Cardiovascular:  Beta Blockers Passed - 07/16/2023  5:10 PM      Passed - Last BP in normal range    BP Readings from Last 1 Encounters:  06/03/23 120/68         Passed - Last Heart Rate in normal range    Pulse Readings from Last 1 Encounters:  06/03/23 74         Passed - Valid encounter within last 6 months    Recent Outpatient Visits           2 months ago Hypertensive kidney disease with  stage 4 chronic kidney disease (HCC)   [place] [facility] Jack[facility] Kindle, FNP   7 months ago Dizziness   [place] [facility] Alfredia Ferguson, PA-C   7 months ago Spinal stenosis of lumbar region with neurogenic claudication   [facility] Mer[facility] T, FNP   8 months ago Self-care deficit for feeding, bathing, and toileting   [facility] Merita Norton T, FNP   1 year ago Morbid obesity due to excess calories [facility])   [facility] Mer[facility] Norton T, FNP               montelukast (SINGULAIR) 10 MG tablet 90 tablet 1    Sig: Take 1 tablet (10 mg total) by mouth daily.     Pulmonology:  Leukotriene Inhibitors Passed - 07/16/2023  5:10 PM      Passed - Valid encounter within last  12 months    Recent Outpatient Visits           2 months ago Hypertensive kidney disease with stage 4 chronic kidney disease [facility])   [facility] [facility], FNP   7 months ago Dizziness   [facility], PA-C   7 months ago Spinal stenosis of lumbar region with neurogenic claudication   [facility] Mer[facility] Norton T, FNP   8 months ago Self-care deficit for feeding, bathing, and toileting   [facility] Merita Norton T, FNP   1 year ago Morbid obesity due to excess calories [facility])   [place] [facility] Mer[facility] Norton T, FNP               hydrALAZINE (APRESOLINE) 50 MG tablet 270 tablet 3    Sig: Take 1 tablet (50 mg total) by mouth 3 (three) times daily.     Cardiovascular:  Vasodilators Failed - 07/16/2023  5:10 PM      Failed - HCT in normal range and within 360 days    HCT  Date Value Ref Range Status  05/29/2023 31.8 (L) 36.0 - 46.0 % Final   Hematocrit  Date Value Ref Range Status  06/02/2022 37.0 34.0 - 46.6 % Final         Failed - HGB  in normal range and within 360 days    Hemoglobin  Date Value Ref Range Status  05/29/2023 10.0 (L) 12.0 - 15.0 g/dL Final  95/62/1308 65.7 11.1 - 15.9 g/dL Final         Failed - RBC in normal range and within 360 days    RBC  Date Value Ref Range Status  05/29/2023 3.55 (L) 3.87 - 5.11 MIL/uL Final         Failed - ANA Screen, Ifa, Serum in normal range and within 360 days    No results found for: "ANA", "ANATITER", "LABANTI"       Passed - WBC in normal range and within 360 days    WBC  Date Value Ref Range Status  05/29/2023 9.5 4.0 - 10.5 K/uL Final         Passed - PLT in normal range and within 360 days    Platelets  Date Value Ref Range Status  05/29/2023 270 150 - 400 K/uL Final  06/02/2022 259 150 - 450 x10E3/uL Final         Passed - Last BP in normal range    BP Readings from Last 1 Encounters:  06/03/23 120/68         Passed - Valid encounter within last 12 months    Recent Outpatient Visits           2 months ago Hypertensive kidney disease with stage 4 chronic kidney disease (HCC)   [place] [facility] Jacky Kindle, FNP   7 months ago Dizziness   [place] [facility] Alfredia Ferguson, PA-C   7 months ago Spinal stenosis of lumbar region with neurogenic claudication   [facility] Jacky Kindle, FNP   8 months ago Self-care deficit for feeding, bathing, and toileting   [facility] Merita Norton T, FNP   1 year ago Morbid obesity due to excess calories [facility])   [facility] Jacky Kindle, [place]  calcitRIOL (ROCALTROL) 0.25 MCG capsule 90 capsule 3     Endocrinology:  Vitamins - Vitamin D Supplementation - calcitriol Failed - 07/16/2023  5:10 PM      Failed - Phosphate in normal range and within 360 days    Phosphorus  Date Value Ref Range Status  01/23/2019 3.7 2.5 - 4.6 mg/dL Final    Comment:    Performed at  [facility], [address] [place], [place] [address]         Failed - PTH in normal range and within 360 days    No results found for: "IOPTH", "PTHINTACTFNA", "PTH"       Passed - Ca in normal range and within 360 days    Calcium  Date Value Ref Range Status  05/29/2023 9.0 8.9 - 10.3 mg/dL Final         Passed - Valid encounter within last 12 months    Recent Outpatient Visits           2 months ago Hypertensive kidney disease with stage 4 chronic kidney disease (HCC)   [place] [facility] Jack[facility] Kindle, FNP   7 months ago Dizziness   [facility] Alfredia Ferguson, PA-C   7 months ago Spinal stenosis of lumbar region with neurogenic claudication   [facility] Merita Norton T, FNP   8 months ago Self-care deficit for feeding, bathing, and toileting   [place] [facility] Merita Norton T, FNP   1 year ago Morbid obesity due to excess calories [facility])   [place] [facility] Mer[facility] Norton T, FNP               gabapentin (NEURONTIN) 600 MG tablet 270 tablet 0    Sig: Take 1 tablet (600 mg total) by mouth 3 (three) times daily.     Neurology: Anticonvulsants - gabapentin Failed - 07/16/2023  5:10 PM      Failed - Cr in normal range and within 360 days    Creatinine, Ser  Date Value Ref Range Status  06/03/2023 3.41 (H) 0.44 - 1.00 mg/dL Final         Passed - Completed PHQ-2 or PHQ-9 in the last 360 days      Passed - Valid encounter within last 12 months    Recent Outpatient Visits           2 months ago Hypertensive kidney disease with stage 4 chronic kidney disease [facility])   [place] [facility] Jacky Kindle, FNP   7 months ago Dizziness   [place] [facility] Alfredia Ferguson, PA-C   7 months ago Spinal stenosis of lumbar region with neurogenic claudication   [facility] Merita Norton T,  FNP   8 months ago Self-care deficit for feeding, bathing, and toileting    [facility] Mer[facility] Norton T, FNP   1 year ago Morbid obesity due to excess calories [facility])   [place] [facility] Merita Norton T, FNP               FLUoxetine (PROZAC) 20 MG capsule 270 capsule 3    Sig: Take 3 capsules (60 mg total) by mouth daily. Take 3 capsules daily.     Psychiatry:  Antidepressants - SSRI Passed - 07/16/2023  5:10 PM      Passed - Completed PHQ-2 or PHQ-9 in the last 360 days  Passed - Valid encounter within last 6 months    Recent Outpatient Visits           2 months ago Hypertensive kidney disease with stage 4 chronic kidney disease [facility])   [place] [facility] Jacky Kindle, FNP   7 months ago Dizziness   [place] [facility] Alfred[facility] Ferguson, PA-C   7 months ago Spinal stenosis of lumbar region with neurogenic claudication   [place] Jacky Kindle, FNP   8 months ago Self-care deficit for feeding, bathing, and toileting   [place] [facility] Merita Norton T, FNP   1 year ago Morbid obesity due to excess calories [facility])   [facility] Jacky Kindle, FNP

## 2023-07-17 MED ORDER — CALCITRIOL 0.25 MCG PO CAPS: 0.2500 ug | ORAL_CAPSULE | Freq: Every day | ORAL | 3 refills | Status: AC

## 2023-07-17 MED ORDER — HYDRALAZINE HCL 50 MG PO TABS: 50.0000 mg | ORAL_TABLET | Freq: Three times a day (TID) | ORAL | 3 refills | Status: AC

## 2023-07-20 ENCOUNTER — Other Ambulatory Visit: Payer: Self-pay | Admitting: Family Medicine

## 2023-07-20 DIAGNOSIS — F419 Anxiety disorder, unspecified: Secondary | ICD-10-CM

## 2023-07-22 ENCOUNTER — Other Ambulatory Visit: Payer: Self-pay

## 2023-07-22 DIAGNOSIS — F419 Anxiety disorder, unspecified: Secondary | ICD-10-CM

## 2023-07-22 NOTE — Telephone Encounter (Addendum)
Patient called and advised per last OV note she's to return to see Robynn Pane for anxiety/depression and that I can schedule visit. She says she's moved to [place] and that she has an appointment on 07/27/23 with a new provider, she has enough medication (Wellbutrin and Hydrocodone) to last until that appointment. She says she will ask the new provider to send in her medications. She says thank you to [facility] and everyone here at the office for taking good care of her.   [facility] called and spoke to [place], Pensions consultant about the refill(s) wellbutrin requested. Advised of the above from what the patient stated. She says she will cancel it and make a note in the patient's profile.  Summary: medication quantity   Misty from [facility] called sttd the quantity for buPROPion (WELLBUTRIN XL) 150 MG 24 hr tablet needs to be resent as the original shows 1 tablet. Please f/u with pharmacy

## 2023-08-09 ENCOUNTER — Other Ambulatory Visit: Payer: Self-pay | Admitting: Family Medicine

## 2023-08-09 DIAGNOSIS — N184 Chronic kidney disease, stage 4 (severe): Secondary | ICD-10-CM

## 2023-09-11 ENCOUNTER — Telehealth: Payer: Self-pay | Admitting: Family Medicine

## 2023-09-30 ENCOUNTER — Telehealth: Payer: Self-pay | Admitting: Family Medicine

## 2023-09-30 NOTE — Telephone Encounter (Signed)
 Pt is calling in because she wants to know if the [place] [employer] forms could be put into MyChart because she currently resides out of town over an hour away. Please follow up with pt.

## 2023-10-05 ENCOUNTER — Other Ambulatory Visit: Payer: Self-pay

## 2023-10-16 ENCOUNTER — Other Ambulatory Visit: Payer: Self-pay

## 2023-10-27 NOTE — Patient Outreach (Signed)
  Care Management   Visit Note   Name: Ashley Mccullough MRN: [id number] DOB: 12/23/1960  Subjective: Ashley Mccullough is a 63 y.o. year old female  previously receiving primary care services with  Ashley Norton, NP.  Assessment:  Review of patient past medical history, allergies, medications, health status, including review of consultants reports, laboratory and other test data, was performed as part of  evaluation and provision of care management services.    Interventions:   Goals Addressed             This Visit's Progress    Documentation Request       Current Barriers:  Care Management support related to assistance with obtaining disability documents.  Planned Interventions: Discussed current need for copy of disability documents. Documents were submitted by previous PCP/Ashley Suzie Portela, NP. Previous PCP has transitioned from [facility] and Ashley Mccullough is transitioning to a primary care provider in [place], [place]. Due to location, she prefers to have the documents sent electronically to avoid traveling from [place] to [place]. In addition to documents on file, Ashley Mccullough sister/caregiver is requesting a completed copy of page 5 which includes current Axis Diagnosis. Per document review, the  Axis Diagnosis was not complete. Will review chart to determine if documentation was completed by a Behavioral Health/Psychiatry provider.  Collaborated with clinic CMA Ashley Mccullough. Confirmed available copies of documents that were completed by Ashley Norton, NP will be sent to Ashley Mccullough via email.           PLAN Will follow up later this month   Ashley Mccullough [facility] RN Care Manager Direct Dial: [phone]  Fax: [phone] Website: [name]

## 2023-10-28 NOTE — Patient Outreach (Signed)
  Care Management   Visit Note   Name: Ashley Mccullough MRN: [id number] DOB: 1960/10/09  Subjective: Ashley Mccullough is a 63 y.o. year old female.   Engaged with Ashley Perlstein via telephone.  Assessment: Review of patient past medical history, allergies, medications, health status, including review of consultants reports, laboratory and other test data, was performed as part of  evaluation and provision of care management services.   Interventions:   Goals Addressed             This Visit's Progress    Documentation Request       Current Barriers:  Care Management support related to assistance with obtaining disability documents.  Planned Interventions: Discussed current need for copy of disability documents. Documents were submitted by previous PCP/Ashley Suzie Portela, NP. Previous PCP has transitioned from [facility] and Ashley. Mccullough is transitioning to a primary care provider in [place], [place]. Due to location, she prefers to have the documents sent electronically to avoid traveling from [place] to [place]. In addition to documents on file, Ashley Mccullough sister/caregiver is requesting a completed copy of page 5 which includes current Axis Diagnosis. Per document review, the Axis Diagnosis was not complete. Will review chart to determine if documentation was completed by a Behavioral Health/Psychiatry provider.  Collaborated with clinic CMA Lowella Bandy. Confirmed available copies of documents that were completed by Merita Norton, NP will be sent to Ashley. Ashley Mccullough via email.  Update 10/16/23: Ashley. Circle confirmed her sister/caregiver was able to obtain and submit required documents for disability. Discussed section r/t Axis Diagnosis. Informed that this assessment was not completed on documents available to clinic staff. Unable to find recent reference/assessment from Psychiatry or Behavioral Health provider. She acknowledges section was likely not complete due to not having a  consistent Psych/Behavioral health provider for several years. Reports recently reestablishing care in [place]. Will speak with provider and request assessment to complete this section. Will update her sister regarding need for this section to be completed by a Psych/Behavioral Health provider.         PLAN Ashley. Matlin has relocated to [place]. Primary Care services have transitioned to [facility]/Sarah Heminger.     Ashley Mccullough [facility] Population Health RN Care Manager Direct Dial: [phone]  Fax: [phone] Website: [name]

## 2023-12-24 ENCOUNTER — Other Ambulatory Visit: Payer: Self-pay | Admitting: Family Medicine

## 2023-12-24 DIAGNOSIS — F419 Anxiety disorder, unspecified: Secondary | ICD-10-CM

## 2023-12-24 DIAGNOSIS — I129 Hypertensive chronic kidney disease with stage 1 through stage 4 chronic kidney disease, or unspecified chronic kidney disease: Secondary | ICD-10-CM

## 2023-12-24 NOTE — Telephone Encounter (Unsigned)
 Copied from CRM [id number]. Topic: Clinical - Medication Refill >> Dec 24, 2023  3:13 PM Marlow Baars wrote: Most Recent Primary Care Visit:  Provider: Merita Norton T  Department: [id number]  Visit Type: MYCHART VIDEO VISIT  Date: 05/13/2023  Medication: FLUoxetine (PROZAC) 20 MG capsule, metoprolol succinate (TOPROL-XL) 25 MG 24 hr tablet  Has the patient contacted their pharmacy? Yes   Is this the correct pharmacy for this prescription? Yes If no, delete pharmacy and type the correct one.  This is the patient's preferred pharmacy:    [facility], [place] - [address] [place] [address] Phone: [phone] Fax: [phone]     Has the prescription been filled recently? No  Is the patient out of the medication? No but she is running low  Has the patient been seen for an appointment in the last year OR does the patient have an upcoming appointment? Yes  Can we respond through MyChart? Yes  Please assist patient further

## 2023-12-25 NOTE — Telephone Encounter (Signed)
 OV needed to establish new PCP.  Requested Prescriptions  Pending Prescriptions Disp Refills   FLUoxetine (PROZAC) 20 MG capsule 270 capsule 1    Sig: Take 3 capsules (60 mg total) by mouth daily. Take 3 capsules daily.     Psychiatry:  Antidepressants - SSRI Failed - 12/25/2023 11:40 AM      Failed - Valid encounter within last 6 months    Recent Outpatient Visits   None            Passed - Completed PHQ-2 or PHQ-9 in the last 360 days       metoprolol succinate (TOPROL-XL) 25 MG 24 hr tablet 90 tablet 1    Sig: TAKE 1 TABLET BY MOUTH EVERY DAY     Cardiovascular:  Beta Blockers Failed - 12/25/2023 11:40 AM      Failed - Valid encounter within last 6 months    Recent Outpatient Visits   None            Passed - Last BP in normal range    BP Readings from Last 1 Encounters:  06/03/23 120/68         Passed - Last Heart Rate in normal range    Pulse Readings from Last 1 Encounters:  06/03/23 74

## 2024-03-23 ENCOUNTER — Telehealth: Payer: Self-pay | Admitting: Family Medicine

## 2024-03-23 DIAGNOSIS — F419 Anxiety disorder, unspecified: Secondary | ICD-10-CM

## 2024-03-23 DIAGNOSIS — I129 Hypertensive chronic kidney disease with stage 1 through stage 4 chronic kidney disease, or unspecified chronic kidney disease: Secondary | ICD-10-CM

## 2024-03-23 NOTE — Telephone Encounter (Signed)
 Spoke with patient, advised that appt is needed to process refills as last visit was in October 2024 with provider that is no longer at [facility]. Pt declined scheduling. Says she will f/u with her PCP at [place]

## 2024-03-23 NOTE — Telephone Encounter (Signed)
 Copied from CRM [id number]. Topic: Clinical - Medication Refill >> Mar 23, 2024  1:25 PM Fonda T wrote: [facility], calling on behalf of patient for medication refills of the following:  Medication: gabapentin  (NEURONTIN ) 600 MG tablet   montelukast  (SINGULAIR ) 10 MG tablet  FLUoxetine  (PROZAC ) 20 MG capsule  metoprolol  succinate (TOPROL -XL) 25 MG 24 hr tablet  Has the patient contacted their pharmacy? Yes, pharmacy is calling requesting (Agent: If no, request that the patient contact the pharmacy for the refill. If patient does not wish to contact the pharmacy document the reason why and proceed with request.) (Agent: If yes, when and what did the pharmacy advise?)  This is the patient's preferred pharmacy:   [facility] [place]  [place], [place] - [address] [place] [address] Phone: [phone] Fax: [phone]   Is this the correct pharmacy for this prescription? Yes If no, delete pharmacy and type the correct one.   Has the prescription been filled recently? Yes  Is the patient out of the medication? Yes  Has the patient been seen for an appointment in the last year OR does the patient have an upcoming appointment? Yes  Can we respond through MyChart? No  Agent: Please be advised that Rx refills may take up to 3 business days. We ask that you follow-up with your pharmacy.

## 2024-08-23 ENCOUNTER — Other Ambulatory Visit: Payer: Self-pay | Admitting: Family Medicine

## 2024-08-23 DIAGNOSIS — I129 Hypertensive chronic kidney disease with stage 1 through stage 4 chronic kidney disease, or unspecified chronic kidney disease: Secondary | ICD-10-CM

## 2024-08-23 DIAGNOSIS — F419 Anxiety disorder, unspecified: Secondary | ICD-10-CM

## 2024-08-23 NOTE — Telephone Encounter (Unsigned)
 Copied from CRM #[id number]. Topic: Clinical - Medication Refill >> Aug 23, 2024  9:34 AM Delon T wrote: Medication: calcitRIOL  (ROCALTROL ) 0.25 MCG capsule hydrALAZINE  (APRESOLINE ) 50 MG tablet hydrOXYzine  (VISTARIL ) 25 MG capsule losartan  (COZAAR ) 100 MG tablet   Has the patient contacted their pharmacy? Yes (Agent: If no, request that the patient contact the pharmacy for the refill. If patient does not wish to contact the pharmacy document the reason why and proceed with request.) (Agent: If yes, when and what did the pharmacy advise?)  This is the patient's preferred pharmacy:    [facility] [place]  [place], [place] - [address] [place] [address] Phone: [phone] Fax: [phone]    Is this the correct pharmacy for this prescription? Yes If no, delete pharmacy and type the correct one.   Has the prescription been filled recently? Yes  Is the patient out of the medication? No  Has the patient been seen for an appointment in the last year OR does the patient have an upcoming appointment? Yes  Can we respond through MyChart? Yes  Agent: Please be advised that Rx refills may take up to 3 business days. We ask that you follow-up with your pharmacy.

## 2024-08-25 NOTE — Telephone Encounter (Signed)
 Requested Prescriptions  Pending Prescriptions Disp Refills   calcitRIOL  (ROCALTROL ) 0.25 MCG capsule 90 capsule 3    Sig: Take 1 capsule (0.25 mcg total) by mouth daily.     Endocrinology:  Vitamins - Vitamin D  Supplementation - calcitriol  Failed - 08/25/2024 10:35 AM      Failed - Phosphate in normal range and within 360 days    Phosphorus  Date Value Ref Range Status  01/23/2019 3.7 2.5 - 4.6 mg/dL Final    Comment:    Performed at [facility], [address] [place], [place] [address]         Failed - PTH in normal range and within 360 days    No results found for: IOPTH, PTHINTACTFNA, PTH       Failed - Ca in normal range and within 360 days    Calcium  Date Value Ref Range Status  05/29/2023 9.0 8.9 - 10.3 mg/dL Final         Failed - Valid encounter within last 12 months    Recent Outpatient Visits   None             hydrALAZINE  (APRESOLINE ) 50 MG tablet 270 tablet 3    Sig: Take 1 tablet (50 mg total) by mouth 3 (three) times daily.     Cardiovascular:  Vasodilators Failed - 08/25/2024 10:35 AM      Failed - HCT in normal range and within 360 days    HCT  Date Value Ref Range Status  05/29/2023 31.8 (L) 36.0 - 46.0 % Final   Hematocrit  Date Value Ref Range Status  06/02/2022 37.0 34.0 - 46.6 % Final         Failed - HGB in normal range and within 360 days    Hemoglobin  Date Value Ref Range Status  05/29/2023 10.0 (L) 12.0 - 15.0 g/dL Final  90/81/7976 88.3 11.1 - 15.9 g/dL Final         Failed - RBC in normal range and within 360 days    RBC  Date Value Ref Range Status  05/29/2023 3.55 (L) 3.87 - 5.11 MIL/uL Final         Failed - WBC in normal range and within 360 days    WBC  Date Value Ref Range Status  05/29/2023 9.5 4.0 - 10.5 K/uL Final         Failed - PLT in normal range and within 360 days    Platelets  Date Value Ref Range Status  05/29/2023 270 150 - 400 K/uL Final  06/02/2022 259 150 - 450 x10E3/uL Final          Failed - ANA Screen, Ifa, Serum in normal range and within 360 days    No results found for: ANA, ANATITER, LABANTI       Failed - Valid encounter within last 12 months    Recent Outpatient Visits   None            Passed - Last BP in normal range    BP Readings from Last 1 Encounters:  06/03/23 120/68          hydrOXYzine  (VISTARIL ) 25 MG capsule 30 capsule     Sig: Take 1 capsule (25 mg total) by mouth at bedtime as needed.     Ear, Nose, and Throat:  Antihistamines 2 Failed - 08/25/2024 10:35 AM      Failed - Cr in normal range and within 360 days  Creatinine, Ser  Date Value Ref Range Status  06/03/2023 3.41 (H) 0.44 - 1.00 mg/dL Final         Failed - Valid encounter within last 12 months    Recent Outpatient Visits   None             losartan  (COZAAR ) 100 MG tablet 90 tablet 1    Sig: Take 1 tablet (100 mg total) by mouth daily.     Cardiovascular:  Angiotensin Receptor Blockers Failed - 08/25/2024 10:35 AM      Failed - Cr in normal range and within 180 days    Creatinine, Ser  Date Value Ref Range Status  06/03/2023 3.41 (H) 0.44 - 1.00 mg/dL Final         Failed - K in normal range and within 180 days    Potassium  Date Value Ref Range Status  05/29/2023 4.2 3.5 - 5.1 mmol/L Final         Failed - Valid encounter within last 6 months    Recent Outpatient Visits   None            Passed - Patient is not pregnant      Passed - Last BP in normal range    BP Readings from Last 1 Encounters:  06/03/23 120/68
